# Patient Record
Sex: Male | Born: 1960 | Race: White | Hispanic: No | Smoking: Former smoker
Health system: Southern US, Community
[De-identification: ages and names within clinical notes are randomized; demographics above are authoritative.]

## PROBLEM LIST (undated history)

## (undated) DIAGNOSIS — J449 Chronic obstructive pulmonary disease, unspecified: Secondary | ICD-10-CM

## (undated) DIAGNOSIS — I10 Essential (primary) hypertension: Secondary | ICD-10-CM

## (undated) DIAGNOSIS — K219 Gastro-esophageal reflux disease without esophagitis: Secondary | ICD-10-CM

## (undated) DIAGNOSIS — M1990 Unspecified osteoarthritis, unspecified site: Secondary | ICD-10-CM

## (undated) DIAGNOSIS — J9601 Acute respiratory failure with hypoxia: Secondary | ICD-10-CM

## (undated) DIAGNOSIS — E559 Vitamin D deficiency, unspecified: Secondary | ICD-10-CM

## (undated) DIAGNOSIS — I639 Cerebral infarction, unspecified: Secondary | ICD-10-CM

## (undated) DIAGNOSIS — K922 Gastrointestinal hemorrhage, unspecified: Secondary | ICD-10-CM

## (undated) DIAGNOSIS — F02C0 Dementia in other diseases classified elsewhere, severe, without behavioral disturbance, psychotic disturbance, mood disturbance, and anxiety: Secondary | ICD-10-CM

## (undated) DIAGNOSIS — F17200 Nicotine dependence, unspecified, uncomplicated: Secondary | ICD-10-CM

## (undated) DIAGNOSIS — E785 Hyperlipidemia, unspecified: Secondary | ICD-10-CM

## (undated) DIAGNOSIS — F329 Major depressive disorder, single episode, unspecified: Secondary | ICD-10-CM

## (undated) DIAGNOSIS — G8191 Hemiplegia, unspecified affecting right dominant side: Secondary | ICD-10-CM

## (undated) HISTORY — DX: Essential (primary) hypertension: I10

## (undated) HISTORY — DX: Cerebral infarction, unspecified: I63.9

## (undated) HISTORY — PX: HERNIA REPAIR: SHX51

---

## 2002-09-11 ENCOUNTER — Inpatient Hospital Stay (HOSPITAL_COMMUNITY): Admission: AD | Admit: 2002-09-11 | Discharge: 2002-09-16 | Payer: Self-pay | Admitting: Neurological Surgery

## 2002-09-11 ENCOUNTER — Encounter: Payer: Self-pay | Admitting: *Deleted

## 2002-09-11 ENCOUNTER — Encounter: Payer: Self-pay | Admitting: Neurological Surgery

## 2002-09-14 ENCOUNTER — Encounter: Payer: Self-pay | Admitting: Neurological Surgery

## 2002-09-18 ENCOUNTER — Encounter: Admission: RE | Admit: 2002-09-18 | Discharge: 2002-09-18 | Payer: Self-pay | Admitting: Internal Medicine

## 2002-09-24 ENCOUNTER — Encounter: Admission: RE | Admit: 2002-09-24 | Discharge: 2002-09-24 | Payer: Self-pay | Admitting: Internal Medicine

## 2002-09-28 ENCOUNTER — Encounter: Admission: RE | Admit: 2002-09-28 | Discharge: 2002-09-28 | Payer: Self-pay | Admitting: Internal Medicine

## 2002-10-05 ENCOUNTER — Encounter: Admission: RE | Admit: 2002-10-05 | Discharge: 2002-10-05 | Payer: Self-pay | Admitting: Internal Medicine

## 2002-10-09 ENCOUNTER — Encounter: Admission: RE | Admit: 2002-10-09 | Discharge: 2002-10-09 | Payer: Self-pay | Admitting: Internal Medicine

## 2002-10-15 ENCOUNTER — Encounter: Admission: RE | Admit: 2002-10-15 | Discharge: 2002-10-15 | Payer: Self-pay | Admitting: Internal Medicine

## 2002-10-23 ENCOUNTER — Encounter: Admission: RE | Admit: 2002-10-23 | Discharge: 2002-10-23 | Payer: Self-pay | Admitting: Internal Medicine

## 2002-10-26 ENCOUNTER — Encounter: Admission: RE | Admit: 2002-10-26 | Discharge: 2002-10-26 | Payer: Self-pay | Admitting: Internal Medicine

## 2008-10-21 ENCOUNTER — Inpatient Hospital Stay (HOSPITAL_COMMUNITY): Admission: EM | Admit: 2008-10-21 | Discharge: 2008-10-25 | Payer: Self-pay | Admitting: Emergency Medicine

## 2008-10-23 ENCOUNTER — Encounter (INDEPENDENT_AMBULATORY_CARE_PROVIDER_SITE_OTHER): Payer: Self-pay | Admitting: *Deleted

## 2008-10-25 ENCOUNTER — Encounter (INDEPENDENT_AMBULATORY_CARE_PROVIDER_SITE_OTHER): Payer: Self-pay | Admitting: *Deleted

## 2008-10-25 ENCOUNTER — Ambulatory Visit: Payer: Self-pay | Admitting: Physical Medicine & Rehabilitation

## 2010-02-10 ENCOUNTER — Emergency Department (HOSPITAL_COMMUNITY): Admission: EM | Admit: 2010-02-10 | Discharge: 2010-02-10 | Payer: Self-pay | Admitting: Emergency Medicine

## 2010-10-18 LAB — BASIC METABOLIC PANEL
BUN: 8 mg/dL (ref 6–23)
Chloride: 101 mEq/L (ref 96–112)
Glucose, Bld: 81 mg/dL (ref 70–99)
Potassium: 3.4 mEq/L — ABNORMAL LOW (ref 3.5–5.1)

## 2010-10-18 LAB — CBC
HCT: 36.2 % — ABNORMAL LOW (ref 39.0–52.0)
Hemoglobin: 13.6 g/dL (ref 13.0–17.0)
MCHC: 34.5 g/dL (ref 30.0–36.0)
MCHC: 34.2 g/dL (ref 30.0–36.0)
MCV: 90.7 fL (ref 78.0–100.0)
Platelets: 121 10*3/uL — ABNORMAL LOW (ref 150–400)
RDW: 14.4 % (ref 11.5–15.5)
RDW: 14.5 % (ref 11.5–15.5)

## 2010-10-18 LAB — DIFFERENTIAL
Basophils Absolute: 0 10*3/uL (ref 0.0–0.1)
Basophils Absolute: 0.1 10*3/uL (ref 0.0–0.1)
Basophils Relative: 1 % (ref 0–1)
Eosinophils Absolute: 0.1 10*3/uL (ref 0.0–0.7)
Eosinophils Relative: 3 % (ref 0–5)
Eosinophils Relative: 3 % (ref 0–5)
Monocytes Absolute: 0.3 10*3/uL (ref 0.1–1.0)
Neutro Abs: 2.8 10*3/uL (ref 1.7–7.7)

## 2010-10-18 LAB — OSMOLALITY: Osmolality: 281 mOsm/kg (ref 275–300)

## 2010-10-18 LAB — URINALYSIS, ROUTINE W REFLEX MICROSCOPIC
Bilirubin Urine: NEGATIVE
Glucose, UA: NEGATIVE mg/dL
Hgb urine dipstick: NEGATIVE
Ketones, ur: NEGATIVE mg/dL
Protein, ur: NEGATIVE mg/dL

## 2010-10-18 LAB — HOMOCYSTEINE: Homocysteine: 11.3 umol/L (ref 4.0–15.4)

## 2010-10-18 LAB — OSMOLALITY, URINE: Osmolality, Ur: 755 mOsm/kg (ref 390–1090)

## 2010-10-18 LAB — APTT: aPTT: 30 seconds (ref 24–37)

## 2010-10-18 LAB — POCT I-STAT, CHEM 8
BUN: 7 mg/dL (ref 6–23)
Calcium, Ion: 0.96 mmol/L — ABNORMAL LOW (ref 1.12–1.32)
Creatinine, Ser: <0.2 mg/dL — ABNORMAL LOW (ref 0.4–1.5)
Glucose, Bld: 117 mg/dL — ABNORMAL HIGH (ref 70–99)
TCO2: 16 mmol/L (ref 0–100)

## 2010-10-18 LAB — LIPID PANEL: HDL: 32 mg/dL — ABNORMAL LOW (ref >39)

## 2010-10-18 LAB — RAPID URINE DRUG SCREEN, HOSP PERFORMED
Amphetamines: NOT DETECTED
Benzodiazepines: NOT DETECTED

## 2010-10-18 LAB — HEMOGLOBIN A1C: Mean Plasma Glucose: 117 mg/dL

## 2010-10-18 LAB — POTASSIUM: Potassium: 3.8 mEq/L (ref 3.5–5.1)

## 2010-11-21 NOTE — Discharge Summary (Signed)
NAMEMarland Sullivan  DERMOT, GREMILLION             ACCOUNT NO.:  [id number]   MEDICAL RECORD NO.:  [id number]          PATIENT TYPE:  INP   LOCATION:  3708                         FACILITY:  [facility]   PHYSICIAN:  Monte Fantasia, MD  DATE OF BIRTH:  [birth year]   DATE OF ADMISSION:  10/21/2008  DATE OF DISCHARGE:  10/25/2008                               DISCHARGE SUMMARY   ADDENDUM   The patient was evaluated by rehab physician and recommended home  physical therapy and outpatient physical therapies and that the patient  could be discharged home and does not need inpatient rehab.  Physical  Therapy reevaluation recommended the same as the patient reports to have  24-hour support.  At present, I discussed with the patient in detail and  with the patient's family [name]  discussed with her on the 24-  [id number], and the patient's family [name], she did agree that the  patient could have the home physical therapy and occupational therapy.  The patient will arrange for the home physical therapy and occupational  therapy for the same.  The patient at present is medically stable to be  discharged and can be discharged home.   Investigations done during the stay in the hospital; a 2-D echo done on  October 25, 2008.   IMPRESSION:  Left ventricular systolic function normal.  Ejection  fraction 55%-60%.  Doppler parameters consistent with abnormal left  ventricular relaxation.  Aortic valve:  Mildly calcified leaflets.  No  aortic stenosis.  No regurgitation.  Aorta:  Mild aortic root  dilatation.  Mitral valve:  No evidence of stenosis or regurgitation.  Left atrium:  Normal size.  Pulmonic valves were poorly visualized.  Tricuspid valve:  Trivial regurgitation.  Pulmonary artery:  Normal  size.  Right atrium:  Normal size.  Pericardium:  No pericardial  effusion.  Inferior vena cava:  Normal in size.   Carotid Doppler is performed.  Preliminary findings:  No assured left  40%-60% ICA stenosis.   Transcranial Doppler is performed. Report is no  obvious right ICA stenosis. Left ICA 40-60% Antegrade vetebral artery  flow bilaterally.   DISPOSITION:  The patient at present is medically stable for discharge.  The patient will arrange for home physical therapy and outpatient  therapy.  The patient reports to have 24-hour support at home.  The  patient's family has been counseled regarding the same and is willing to  provide for 24-hour support.  Also the patient needs to follow up with  [facility] as the patient does not have insurance in next 2-4 weeks.      Monte Fantasia, MD  Electronically Signed     MP/MEDQ  D:  10/25/2008  T:  10/26/2008  Job:  [id number]

## 2010-11-21 NOTE — Discharge Summary (Signed)
NAMEMarland Kitchen  Warren Sullivan, Warren Sullivan             ACCOUNT NO.:  [id number]   MEDICAL RECORD NO.:  [id number]          PATIENT TYPE:  INP   LOCATION:  3708                         FACILITY:  [facility]   PHYSICIAN:  Monte Fantasia, MD  DATE OF BIRTH:  [birth year]   DATE OF ADMISSION:  10/21/2008  DATE OF DISCHARGE:  10/25/2008                               DISCHARGE SUMMARY   PRIMARY CARE PHYSICIAN:  [facility].   DISCHARGE DIAGNOSES:  1. Acute infarction in right corona radiata.  2. Hypertension.  3. Tobacco abuse.  4. Anion gap metabolic acidosis which is resolved.  5. Thrombocytopenia which is stable.   DISCHARGE MEDICATIONS:  1. Aspirin 325 mg p.o. daily.  2. Hydrochlorothiazide 20.5 mg p.o. daily.  3. Albuterol 2 puffs MDI q.6 h. p.r.n. shortness of breath.  4. Potassium chloride 40 mEq p.o. daily.  5. Senna 1 tablet p.o. nightly.  6. Zocor 20 mg p.o. nightly.   COURSE DURING THE HOSPITAL STAY:  Mr. Warren Sullivan is a 50 year old  African American Caucasian male.  The patient was admitted on October 21, 2008 with complaints of status post fall in the bathroom and in the  kitchen x2 and left-sided weakness.  The patient was admitted to [facility] with a stroke protocol.  However, was out of the window for the  TPA.  The patient had initial CT scan revealed no intracranial bleed and  hence was started on aspirin and statins.  Also, the patient's blood  pressure was in 180s and labetalol was given during in the ER to control  the blood pressure and later on started on lisinopril 5 mg.  During the  stay in the hospital, the patient's cardiac enzymes were cycled which  was x3 negative.  Also, the patient was on the telemetry, was found to  be having episodes of bradycardia and hence Ace inhibitor was held.  The  patient's potassium was repleted and continued on statins for the same.  As per the neurology evaluation, the patient had a 2-D echo and  transcranial Dopplers  done today.  Also, the patient reports of which  are still awaited.  The patient also had evaluation with Physical  Therapy and what we used to recommended inpatient rehab.  The patient is  awaiting rehab evaluation at present and would follow up as per their  recommendations.   LABS DONE DURING THE STAY IN THE HOSPITAL:  Total WBC 4.6, hemoglobin  12.5, hematocrit 36.2, platelets of 121.  Sodium 137, potassium 3.8,  chloride 101, bicarb 27.  Glucose 81, BUN 8, creatinine 0.7, hemoglobin  A1c 5.7.  Mean plasma glucose 117, total cholesterol 157, triglycerides  63, HDL 32, LDL 112, homocysteine 11.3.  Drug screen was negative.  UA  was negative.   RADIOLOGICAL INVESTIGATIONS DONE DURING THE STAY IN THE HOSPITAL:  1. MRI of the brain done on October 21, 2008.  Impression; two focal      areas of restricted effusion compatible with acute infarctions of      the right corona radiata, multiple remote infarcts involving the  basal ganglia, extensive white matter disease in periventricular      regions, moderate atrophy, and prominent soft tissue in the      nasopharynx may represent adenoid tissue.  Bilateral mastoid      effusion suggesting potential obstruction.  2. MRA.  Impression; significantly degraded by the patient's motion.      No significant proximal stenosis, aneurysmal branch vessel      occlusions, questionable long segment stenosis of the left PCA P2      segment probable small vessel disease, nonspecific to the patient's      motion.  3. 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  No significant carotid      bifurcation disease, moderate toxicity of the internal carotid      arteries, bilaterally potential sequelae of uncontrolled      hypertension.  4. A 2-D echo report and transcranial Dopplers done today are pending.   PHYSICAL EXAMINATION:  VITAL SIGNS:  Temperature of 98.0, pulse of 59,  respirations 16, blood  pressure of 149/94, oxygen saturation 95% room  air.  HEENT:  Neck is supple.  Pupils are equal and reacting to light.  No  pallor, no lymphadenopathy.  RESPIRATORY:  Air entry is bilaterally equal.  No rales, no rhonchi.  CARDIOVASCULAR:  S1, S2, regular rate and rhythm.  ABDOMEN:  Soft.  No guarding, no rigidity, no tenderness.  EXTREMITIES:  No edema of feet.  NEUROLOGIC:  The patient is alert, awake, and oriented x3.  Cranial  nerves II-XII are intact.  Left upper extremity weakness of 3/3 to 4/5  and left lower extremity weakness which is 4/5, minimal slurring of  speech, deep tendon reflex is deepest on the left side.  Sensation is  intact.  Finger-to-nose test was normal.   DISPOSITION:  The patient at present is awaiting inpatient rehab  consult.  We will await the same 2-D echo and transcranial Doppler  reports are also awaited.  We will follow up regarding the same.  If the  patient at present is medically stable, we will continue on Zocor and  hydrochlorothiazide as for now.  At present, the patient's lisinopril is  on hold as telemetry showed episodes of bradycardia to 30s to 40s;  however, the patient has been asymptomatic and at present after  lisinopril being held, the patient is into his 52s to 52s.  The patient  at present is awaiting inpatient rehab evaluation.  Total time for  dictation is 40 minutes.      Monte Fantasia, MD  Electronically Signed     MP/MEDQ  D:  10/25/2008  T:  10/26/2008  Job:  [id number]

## 2010-11-21 NOTE — H&P (Signed)
NAMEMarland Sullivan  Warren, Sullivan             ACCOUNT NO.:  [id number]   MEDICAL RECORD NO.:  [id number]          PATIENT TYPE:  INP   LOCATION:  3708                         FACILITY:  [facility]   PHYSICIAN:  Darryl D. Prime, MD    DATE OF BIRTH:  07-17-1960   DATE OF ADMISSION:  10/21/2008  DATE OF DISCHARGE:                              HISTORY & PHYSICAL   The patient time about 30 minutes.   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  This started about 7 a.m. this  morning.  He relates falling in the bathroom and in the Sullivan.  He did  not hit his head at this time, both times that he fell, he felt he  cannot get up from the floor.  Again, he started dropping things from  his left hand and was dragging his foot while walking.  His mother also  noted a slurred speech.  He denies any dysphagia.  The patient denied  cough or difficulty swallowing.  She also denies blurry vision and no  dizziness.   ALLERGIES:  No known drug allergies.   PRIMARY CARE PHYSICIAN:  [facility] [place].   PAST MEDICAL HISTORY:  1. Intraventricular hemorrhage in 2004.  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  3. Hypertension.  4. Alcohol abuse.  5. Tobacco abuse.  6. COPD.   MEDICATIONS:  The patient is currently taking no medications.  He cannot  afford them.   FAMILY HISTORY:  His mother has hypertension and his father died at age  of 62 of an acute MI.  He has 4 brothers.  His brothers are healthy and  his children are healthy.   SOCIAL HISTORY:  He continues to smoke at least 1 pack per day.  He quit  drinking alcohol over 2 years.  He denies any recreational drugs.  He  lives in [place] with his mother.   REVIEW OF SYSTEMS:  GENERAL:  He denies fatigue or weakness.  HEENT:  Denies  yellow eyes or sore throat.  CARDIOVASCULAR:  Denies chest pain,  palpitations, PND, or orthopnea.  LUNGS:  Denies cough or dyspnea on  exertion.  ABDOMEN:  Denies constipation or diarrhea.  EXTREMITIES:  No  ulcer in his feet.  No edema.  NEURO:  As per HPI.   PHYSICAL EXAMINATION:  VITAL SIGNS:  Temperature 97.9, heart rate of 84,  blood pressure of 194/128, respiration rate of 20, and he is sating 97%  on room air.  GENERAL APPEARANCE:  He appears in no acute distress.  HEENT:  Moist mucous membranes.  No jaundice, pallor, or bruits.  No  JVD.  CARDIOVASCULAR:  He has regular rate and rhythm with a positive S1 and  S2.  No murmurs, rubs, or gallops.  LUNGS:  Good air movement and clear to auscultation.  ABDOMEN:  Positive bowel sounds, nontender, nondistended, and soft.  EXTREMITIES:  Positive pulses.  No edema.  NEUROLOGIC:  He is  alert and oriented x3.  Coherent and fluent language.  Cranial nerves III through XII are intact except for XI which showed a  4/5 weakness on the left side.  He also shows left-sided hemiparesis on  left arm.  He also shows left arm and left leg weakness 4/5.  He has 2+  deep tendon reflexes.  Sensation is intact throughout and he has a  slurred speech.  Finger-to-nose is normal.  SKIN:  Desquamative lesion on the face, no bleeding and depressed.  They  are dry.  These are on the left side of the face, below the eye, and  multiple on his right side of the face close to his hairline.   LABORATORY DATA:  His UDS is negative.  His UA is negative.  Alcohol  level is less than 5.  EKG shows a sinus rhythm.  Sodium is 135,  potassium 3.8, chloride of 103, bicarb of 16, BUN of 7, creatinine of  less than 0.2, and glucose of 117.  He has an anion gap of 16, PT 14.4,  INR 1.1.  White count 4.8 with an ANC of 2.8, hemoglobin of 13.6 with  MCV of 91 and an RDW of 14.5, and platelets are 129.   MRI pending.   ASSESSMENT AND PLAN:  1. Acute right periventricular  stroke.  The patient is greater than 8      hours out since symptoms started, so he does not qualify for TPA.      Neuro was consulted by the ER.  MRI results are pending and      recommendations by Neuro are pending.  The patient will benefit      from aspirin and blood pressure control.  We will get PT/OT      involved.  We will also get social worker for disability      evaluation.  We will start the patient on Pravachol.  We will check      a fasting lipid panel.  We will also check a hemoglobin A1c and a      fasting blood glucose.  Because of her history of syndrome of      inappropriate antidiuretic hormone, we will also check a serum      osmolality and urine osmolality.  2. Hypertension.  The patient has low income and was not taking his      medication, because he could not afford it.  We will not be too      aggressive with his blood pressure.  Right now, his systolic blood      pressure is in the 180s.  Labetalol, was given in the ED with no      decrease in his blood pressure, so we will start lisinopril 5 mg      and we will also start hydrochlorothiazide, which takes about 3      weeks to start working.  We will try to lower his blood pressure as      slowly as possible over the next week.  3. Tobacco abuse.  I have talked to the patient about quitting      smoking, he refused.  So, we will start him on a nicotine patch.  4. Anion gap metabolic acidosis.  By history and physical, there is no      red flag that would alert [place] of a suicide attempt or consumption of      alcohols or any other substances.  So, we will repeat a  BMET.  The      patient was not hypotensive and his alcohol level was less than 5.  5. Thrombocytopenia.  We will check a peripheral smear.  The patient      is on no medication, which would cause thrombocytopenia.  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  So,      we would      check an ANA.  The patient has not been  exposed to heparin in the      past.  We will also check a peripheral smear to evaluate for any      blood disorder.  6. Prophylaxis.  We will give Protonix for prophylaxis and heparin      subcutaneous.      Marinda Elk, M.D.  Electronically Signed      Darryl D. Prime, MD  Electronically Signed    AF/MEDQ  D:  10/21/2008  T:  10/22/2008  Job:  [id number]   cc:   [facility]

## 2010-11-21 NOTE — Consult Note (Signed)
NAMEMarland Sullivan  BALIN, VANDEGRIFT             ACCOUNT NO.:  [id number]   MEDICAL RECORD NO.:  [id number]          PATIENT TYPE:  INP   LOCATION:  3708                         FACILITY:  [facility]   PHYSICIAN:  Deanna Artis. Hickling, M.D.DATE OF BIRTH:  Feb 24, 1961   DATE OF CONSULTATION:  10/21/2008  DATE OF DISCHARGE:                                 CONSULTATION   CHIEF COMPLAINT:  Left leg weakness and clumsiness.   HISTORY OF PRESENT CONDITION:  The patient was awake from 3 o'clock this  morning and around 7 o'clock when going to the bathroom, he fell.  He  had weakness of his left leg and clumsiness.  He did not present to the  [facility] Emergency Room until 1449 hours, some nearly 8 hours later.  He was noted to have left leg weakness, also numbness.  I was asked to  assist Dr. Ignacia Palma in workup and recommended an MRI scan of the brain  without contrast and MRA intracranial.  The patient had Dopplers and MRA  extracranial.  He showed 2 acute infarctions in the body of the right  caudate.  He has multiple bilateral infarctions in the putamen internal  capsule thalamus bilaterally.  These are subcentimeter lesions.  There  do not appear to be any cortical lesions.  He also has ischemia within  his brain stem.   His MRA does demonstrate intracranial atherosclerotic disease, more  marked in the left middle cerebral artery than the right.  He has fairly  normal vessels in the major trunks of the circle of Willis.  Also in the  internal carotid vertebral arteries except vertebrals at the origin we  do not see a connection between those and the subclavian arteries.  This  may be artifactual.   On the basis of this acute stroke, the patient was admitted to the  hospital.  I was asked to consult to determine the etiology of his  dysfunction and to make recommendations for further workup and  treatment.  His main risk factors for stroke include hypertension and  tobacco.   He does not take any  medications.   He has no known allergies to medicines.   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  There is only a small speck of blood that I can see.  I see no  clips.  I am not certain what this was, but suspected it probably was  hemorrhagic stroke.  The patient stopped working at that time even  though he does not appear to be disabled.  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   Family history is positive for hypertension in his mother and his father  died of complications of COPD, lung cancer, and myocardial infarction.   SOCIAL HISTORY:  He is unemployed since 1994.  He smokes 1-1/2 packs per  day of cigarettes and has done so at least at this level since  childhood.  He does not drink alcohol for the past 2-1/2 years.  He does  not use drugs.  His drug screen is negative.  REVIEW OF SYSTEMS:  NEURO:  Multiple strokes.  Gait disorder.  CARDIOVASCULAR:  No myocardial infarction.  He does have significant  hypertension.  PULMONARY:  Chronic cough.  GI:  No nausea or vomiting.  GU:  No dysuria.  MUSCULOSKELETAL:  He has pain in his left ankle,  recurs in his ankle today.  He has broken the ankle before.  ENDOCRINE:  No diabetes.  REPRODUCTIVE:  Unremarkable.  IMMUNOLOGIC/LYMPHATIC:  No  significant anemia.  ALLERGY:  Negative.  NEUROPSYCHIATRIC:  Negative.   PHYSICAL EXAMINATION:  VITAL SIGNS:  On examination today, temperature  97.9, blood pressure 176/106, heart rate 64, respirations 16, oxygen  saturation 96% on 2 L.  HEAD, EYES, EARS, NOSE, AND THROAT:  No infections.  NECK:  Supple.  No bruits.  His face quite ruddy.  LUNGS:  Clear.  CARDIOVASCULAR:  No murmurs.  Pulses normal.  ABDOMEN:  Soft.  Bowel sounds normal.  SKIN:  Unremarkable.  NEUROLOGIC:  The patient is awake, alert.  He is dysarthric.  He has no  facial strength midline tongue normal strength.  Fine motor movements  were okay.  No drift.  In the NIH stroke scale, he  did have slight 2 in  his arm and leg.  According to protocol, he has a mild left sensory  hypesthesia in the arm and has left leg dystaxia.  Otherwise, his  examination is negative.  I did not have him walk.  His reflexes are  normal to brisk, previous bilateral flexor plantar responses.   LABORATORY STUDIES:  Sodium 135, potassium 3.8, chloride 103, CO2 of 16,  BUN 7, creatinine 0.2, glucose 117.  White blood cell count 4800,  hemoglobin 13.6, hematocrit 39.9, platelet count 129,000.  PT 14.4, INR  1.0, PTT 30.  I have discussed the MRI scan.   IMPRESSION:  1. Acute right body of the caudate subcentimeter lacunar lesions      widely related to intracranial small vessel atherosclerotic disease      related to hypertension and smoking. (434.01, 404.1)  2. Remote bilateral subcortical infarctions multiple bilaterally.  3. Hypertension in poor control.  4. Tobacco abuse.   PLAN:  The patient is evaluated for his risk factors for stroke and  needed to be treated with medications that he could afford.  I have told  him that he needs to quit smoking as did the admitting physician.  I do  not think that he will be motivated to do so.   He needs a noninvasive vascular workup, 2-D echocardiogram, carotid  Doppler, transcranial Doppler, as well as laboratory studies.  I will  check out him in the morning.  He will be followed by our stroke  service.      Deanna Artis. Sharene Skeans, M.D.  Electronically Signed     [facility]/MEDQ  D:  10/21/2008  T:  10/22/2008  Job:  [id number]

## 2010-11-24 NOTE — Discharge Summary (Signed)
NAME:  Warren Sullivan, Warren Sullivan                       ACCOUNT NO.:  [id number]   MEDICAL RECORD NO.:  [id number]                   PATIENT TYPE:  INP   LOCATION:  3024                                 FACILITY:  [facility]   PHYSICIAN:  C. Ulyess Mort, M.D.             DATE OF BIRTH:  11-Dec-1960   DATE OF ADMISSION:  09/11/2002  DATE OF DISCHARGE:  09/16/2002                                 DISCHARGE SUMMARY   DISCHARGE DIAGNOSES:  1. Intraventricular hemorrhage secondary to cerebrovascular accident.  2. 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  3. Hypertension.  4. Alcohol abuse.  5. Probable chronic obstructive pulmonary disease.  6. Tobacco abuse.   DISCHARGE MEDICATIONS:  1. Enalapril 10 mg 1 p.o. b.i.d.  2. Metoprolol 150 mg p.o. b.i.d.  3. Norvasc 5 mg p.o. daily.   DISCHARGE INSTRUCTIONS:  The patient was instructed to limit fluid to  include water, coffee, tea, beer, and juice, etc., as soon as possible to  about a quart or 1 liter a day.   FOLLOW UP:  The patient[ is to followup at the outpatient clinic at [facility] on Friday, September 18, 2002 at 10 o'clock a.m. for lab work and  Thursday 09/24/2002 at 3 p.m. for visit and lab work.  As per patient request  for long-term care, he was requested to make an appointment as soon as  possible at the [facility].  (He had been seen in the past  at the clinic but did not have records when I called.)   HISTORY OF PRESENT ILLNESS:  The patient is a 50 year old white male who was  admitted to the neurosurgery service with a intraventricular hemorrhage.  He  initially was found by his mother on the morning of admission to be poorly  responsive.  He as initially evaluated at [facility] where CT of  brain demonstrated a right-sided intraventricular hemorrhage with origin  apparently being right thalamus.   PAST MEDICAL HISTORY:   Significant for no previous medical care.  He does  not have a regular physician.   MEDICATIONS:  He denies taking any prescription medicine but does take Goody  powders and alcohol (up to more than a case at a time) regularly.   PHYSICAL EXAMINATION:  VITAL SIGNS:  Blood pressure 170/86, heart rate 76,  respirations 16.  NEUROLOGIC:  Per admission note, the patient was alert to voice and followed  commands but was oriented only x 1 (hospital but not date).  Otherwise exam  was nonfocal at that time.  CARDIAC:  Normal.  LUNGS:  Decreased breath sounds.  ABDOMEN:  Liver edge palpable otherwise benign.   HOSPITAL COURSE:  The patient was admitted to the neurosurgery service.  He  was initially observed and received blood pressure management.  The  following issues were addressed during the hospital course.  1. INTRAVENTRICULAR HEMORRHAGE:  Initial CT.  An MRI was then obtained to     rule out possibility of tumor or vascular malformation.  The MRI     revealed intraventricular hemorrhage.  No large amount of clot was     located within the right occipital horn and atrial trigone adjacent to     choroid plexus.  The radiologist felt possible site of hemorrhage either     from the choroid plexus or the ependymal lining of the ventricle.  There     was a small amount of blood in the left atrial trigone and occipital horn     and fourth ventricle which likely represented spread from location from     the right side via CSF pathways.  There was no evidence of significant     subarachnoid hemorrhage or definite parenchymal hemorrhage.  The patient     also had a mild obstructive hydrocephalus.  There was advanced atrophy     and small vessel disease given the patient's age and a small acute     lacunar infarction in the right parietal deep white matter. Study done on     March 5.  A repeat of the CT of the head performed on March 8 revealed     that the intraventricular hemorrhage involving  predominately the     occipital horn of the right ventricle appeared to hae decreased slightly     since the MRI.  There were no new abnormalities.  The patient's     intraventricular hemorrhage was felt to be stable.  He was transferred     out of the ICU and into the care of the general medical internal medicine     service team.  At the time of discharge, he had no residual neurological     deficits, had not complaint of headache or visual changes.  He had no     difficulty with strength or motor coordination.  Initial workup for     coagulopathy was negative with patient's PTT 29 and INR 0.9.   1. SYNDROME OF INAPPROPRIATE SECRETION OF ANTIDIURETIC HORMONE SECONDARY TO     #1:  At admission, the patient's sodium was 122, chloride 91, potassium     4.0,  During admission, sodium increased to a maximum of 129, but then on     transfer to the regular floor, the patient's sodium had declined back to     123 with a chloride of 91.  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  Urine osmolality of 454 at the time the sodium was 120     supports this.  TSH was within normal limits to rule out hypothyroidism     as cause of hyponatremia.  Measured serum osmolality was low at 251.     Fluid restriction was initiated for patient on March 9, and sodium was     elevated to 125 prior to discharge.  At discharge BMP showed sodium 125,     potassium 4.1, chloride 93, bicarb 25, BUN 8, creatinine 0.6, glucose 97.     The cause and possible consequences of hyponatremia were discussed with     the patient.  Importance of maintaining a low-fluid intake of     approximately no more than a liter as possible was reiterated.  Risks of  noncompliance with fluid restriction that was explained to the patient     include risk of seizure.  The patient appeared to understand and agreed    to comply with the fluid  restriction.  He is to follow up in two days     after discharge for repeat electrolyte test.  It is anticipated that     SIADH should resolve as the patient recovers from the intraventricular     hemorrhage.   1. HYPERTENSION:  At discharge, the patient had fair blood pressure control     with blood pressure of 156/78.  Antihypertensive medicines are listed as     previously described.  The patient was not given a thiazide due to the     fact that there is risk of worsening hyponatremia.   1. ALCOHOL ABUSE:  The patient freely admits to consumption of regularly a     six-pack, occasionally a case and on occasion more than a case of beer a     day.  Throughout the hospital course with the exception of when he was     placed on fluid restriction, the patient received a beer with meals and     at bedtime for prophylaxis to prevent withdrawal.  He did not have any     symptoms of withdrawal tremens throughout the hospital course.  Risks of     increased disease if he were to continue increased alcohol consumption     were discussed with the patient.   1. PROBABLE CHRONIC OBSTRUCTIVE PULMONARY DISEASE:  On exam, the patient had     decreased breath sounds with expiratory greater than inspiratory phase of     respiration.  He received a nicotine patch while in the hospital but     appeared to be in great contemplative state in regards to smoking     cessation.  He received only one albuterol treatment during hospital     course.  It was recommended, as an outpatient, that he receive formal     pulmonary function tests to determine his pulmonary status and may     warrant treatment for COPD as an outpatient.    DISPOSITION:  The patient was discharged to home with previously described  followup and prescriptions.  Again, he is to come for acute follow up to the  outpatient clinic, but he requested long-term followup with physicians at  the [facility] as he lives just a few  minutes from that  clinic, and all of his family members go to that clinic.     Douglass Rivers, M.D.                      Gary Fleet, M.D.    CH/MEDQ  D:  09/16/2002  T:  09/17/2002  Job:  [id number]   cc:   [facility]
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
## 2013-01-21 ENCOUNTER — Ambulatory Visit: Payer: Self-pay | Admitting: Family Medicine

## 2013-02-09 ENCOUNTER — Ambulatory Visit: Payer: Self-pay | Admitting: Family Medicine

## 2013-02-19 ENCOUNTER — Encounter (HOSPITAL_COMMUNITY): Payer: Self-pay | Admitting: Emergency Medicine

## 2013-02-19 ENCOUNTER — Emergency Department (HOSPITAL_COMMUNITY): Payer: Medicare Other

## 2013-02-19 ENCOUNTER — Ambulatory Visit (INDEPENDENT_AMBULATORY_CARE_PROVIDER_SITE_OTHER): Payer: Medicare Other | Admitting: Family Medicine

## 2013-02-19 ENCOUNTER — Encounter: Payer: Self-pay | Admitting: Family Medicine

## 2013-02-19 ENCOUNTER — Emergency Department (HOSPITAL_COMMUNITY)
Admission: EM | Admit: 2013-02-19 | Discharge: 2013-02-19 | Disposition: A | Discharge status: Home / Self Care | Payer: Medicare Other | Attending: Emergency Medicine | Admitting: Emergency Medicine

## 2013-02-19 VITALS — BP 201/105 | HR 67 | Temp 97.0°F | Ht 72.0 in | Wt 195.0 lb

## 2013-02-19 DIAGNOSIS — Z91199 Patient's noncompliance with other medical treatment and regimen due to unspecified reason: Secondary | ICD-10-CM | POA: Insufficient documentation

## 2013-02-19 DIAGNOSIS — Z8673 Personal history of transient ischemic attack (TIA), and cerebral infarction without residual deficits: Secondary | ICD-10-CM | POA: Insufficient documentation

## 2013-02-19 DIAGNOSIS — Z9119 Patient's noncompliance with other medical treatment and regimen: Secondary | ICD-10-CM | POA: Insufficient documentation

## 2013-02-19 DIAGNOSIS — K5900 Constipation, unspecified: Secondary | ICD-10-CM

## 2013-02-19 DIAGNOSIS — I10 Essential (primary) hypertension: Secondary | ICD-10-CM | POA: Insufficient documentation

## 2013-02-19 DIAGNOSIS — R4789 Other speech disturbances: Secondary | ICD-10-CM | POA: Insufficient documentation

## 2013-02-19 DIAGNOSIS — F17200 Nicotine dependence, unspecified, uncomplicated: Secondary | ICD-10-CM | POA: Insufficient documentation

## 2013-02-19 DIAGNOSIS — R4701 Aphasia: Secondary | ICD-10-CM | POA: Insufficient documentation

## 2013-02-19 LAB — CBC WITH DIFFERENTIAL/PLATELET
Basophils Absolute: 0 10*3/uL (ref 0.0–0.1)
Eosinophils Absolute: 0.1 10*3/uL (ref 0.0–0.7)
Lymphocytes Relative: 39 % (ref 12–46)
Lymphs Abs: 2.3 10*3/uL (ref 0.7–4.0)
Neutrophils Relative %: 52 % (ref 43–77)
Platelets: 129 10*3/uL — ABNORMAL LOW (ref 150–400)
RBC: 4.35 MIL/uL (ref 4.22–5.81)
RDW: 12.9 % (ref 11.5–15.5)
WBC: 5.9 10*3/uL (ref 4.0–10.5)

## 2013-02-19 LAB — BASIC METABOLIC PANEL
CO2: 28 mEq/L (ref 19–32)
GFR calc non Af Amer: >90 mL/min (ref >90)
Glucose, Bld: 84 mg/dL (ref 70–99)
Potassium: 3.7 mEq/L (ref 3.5–5.1)
Sodium: 138 mEq/L (ref 135–145)

## 2013-02-19 LAB — URINALYSIS, ROUTINE W REFLEX MICROSCOPIC
Hgb urine dipstick: NEGATIVE
Leukocytes, UA: NEGATIVE
Protein, ur: NEGATIVE mg/dL
Specific Gravity, Urine: 1.005 (ref 1.005–1.030)
Urobilinogen, UA: 0.2 mg/dL (ref 0.0–1.0)

## 2013-02-19 MED ORDER — HYDROCHLOROTHIAZIDE 25 MG PO TABS: 25.0000 mg | ORAL_TABLET | Freq: Every day | ORAL | Status: DC

## 2013-02-19 MED ORDER — POLYETHYLENE GLYCOL 3350 17 GM/SCOOP PO POWD: 17.0000 g | Freq: Two times a day (BID) | ORAL | Status: DC | PRN

## 2013-02-19 NOTE — ED Provider Notes (Signed)
CSN: [id number]     Arrival date & time 02/19/13  1756 History     First MD Initiated Contact with Patient 02/19/13 1802     Chief Complaint  Patient presents with  . Hypertension  . Aphasia   (Consider location/radiation/quality/duration/timing/severity/associated sxs/prior Treatment) Patient is a 52 y.o. male presenting with hypertension. The history is provided by the patient and medical records.  Hypertension This is a chronic problem. The current episode started more than 1 year ago. The problem occurs constantly. The problem has been unchanged. Pertinent negatives include no abdominal pain, chest pain, congestion, coughing, fatigue, fever, headaches, nausea, neck pain, numbness, rash, sore throat, vomiting or weakness. Nothing aggravates the symptoms. Treatments tried: has been non-compliant with his anti-hypertensives. The treatment provided no relief.    Past Medical History  Diagnosis Date  . Hypertension   . Stroke    Past Surgical History  Procedure Laterality Date  . Hernia repair     History reviewed. No pertinent family history. History  Substance Use Topics  . Smoking status: Current Every Day Smoker  . Smokeless tobacco: Not on file  . Alcohol Use: No    Review of Systems  Constitutional: Negative for fever, activity change, appetite change and fatigue.  HENT: Negative for congestion, sore throat, facial swelling, rhinorrhea, trouble swallowing, neck pain, neck stiffness, voice change and sinus pressure.   Eyes: Negative.   Respiratory: Negative for cough, choking, chest tightness, shortness of breath and wheezing.   Cardiovascular: Negative for chest pain.  Gastrointestinal: Negative for nausea, vomiting and abdominal pain.  Genitourinary: Negative for dysuria, urgency, frequency, hematuria, flank pain and difficulty urinating.  Musculoskeletal: Negative for back pain and gait problem.  Skin: Negative for rash and wound.  Neurological: Negative for facial  asymmetry, weakness, numbness and headaches.  Psychiatric/Behavioral: Negative for behavioral problems, confusion and agitation. The patient is not nervous/anxious and is not hyperactive.   All other systems reviewed and are negative.    Allergies  Review of patient's allergies indicates no known allergies.  Home Medications   Current Outpatient Rx  Name  Route  Sig  Dispense  Refill  . Magnesium Hydroxide (MILK OF MAGNESIA PO)   Oral   Take 1 each by mouth daily as needed (constipation).         . Naproxen Sodium (ALEVE PO)   Oral   Take 2 tablets by mouth daily as needed (headache, pain).         . Sennosides (EX-LAX PO)   Oral   Take 2 tablets by mouth daily as needed (constipation).         . polyethylene glycol powder (GLYCOLAX/MIRALAX) powder   Oral   Take 17 g by mouth 2 (two) times daily as needed.   3350 g   1    BP 153/89  Pulse 57  Temp(Src) 97.4 F (36.3 C) (Oral)  Resp 22  SpO2 95% Physical Exam  Nursing note and vitals reviewed. Constitutional: He is oriented to person, place, and time. He appears well-developed and well-nourished. No distress.  HENT:  Head: Normocephalic and atraumatic.  Right Ear: External ear normal.  Left Ear: External ear normal.  Mouth/Throat: No oropharyngeal exudate.  Eyes: Conjunctivae and EOM are normal. Pupils are equal, round, and reactive to light. Right eye exhibits no discharge. Left eye exhibits no discharge.  Neck: Normal range of motion. Neck supple. No JVD present. No tracheal deviation present. No thyromegaly present.  Cardiovascular: Normal rate, regular rhythm, normal heart  sounds and intact distal pulses.  Exam reveals no gallop and no friction rub.   No murmur heard. Pulmonary/Chest: Effort normal and breath sounds normal. No respiratory distress. He has no wheezes. He exhibits no tenderness.  Abdominal: Soft. Bowel sounds are normal. He exhibits no distension. There is no tenderness. There is no rebound  and no guarding.  Musculoskeletal: Normal range of motion. He exhibits no edema and no tenderness.  Lymphadenopathy:    He has no cervical adenopathy.  Neurological: He is alert and oriented to person, place, and time. He has normal strength. No cranial nerve deficit or sensory deficit. He exhibits normal muscle tone. He displays a negative Romberg sign. Coordination and gait normal. GCS eye subscore is 4. GCS verbal subscore is 5. GCS motor subscore is 6.  Normal finger to nose and heel to shin. Patient able to repeat words and has no apparent aphasia on my exam.  Skin: Skin is warm and dry. No rash noted. He is not diaphoretic. No pallor.  Psychiatric: He has a normal mood and affect. His behavior is normal.    ED Course   Procedures (including critical care time)  Labs Reviewed  CBC WITH DIFFERENTIAL - Abnormal; Notable for the following:    HCT 38.7 (*)    MCHC 36.2 (*)    Platelets 129 (*)    All other components within normal limits  BASIC METABOLIC PANEL  URINALYSIS, ROUTINE W REFLEX MICROSCOPIC   Dg Chest 2 View  02/19/2013   *RADIOLOGY REPORT*  Clinical Data: Aphasia, hypertension  CHEST - 2 VIEW  Comparison: None.  Findings: Lung volumes are low with crowding of the bronchovascular markings.  No focal/lobar consolidation.  No pleural effusion. Heart size is normal. Aorta is ectatic and unfolded.  No acute osseous finding.  IMPRESSION: Curvilinear bilateral lobe atelectasis.  No focal acute finding otherwise.   Original Report Authenticated By: Christiana Pellant, M.D.   Ct Head Wo Contrast  02/19/2013   *RADIOLOGY REPORT*  Clinical Data: Hypertension.  Altered speech.  History of prior infarcts.  CT HEAD WITHOUT CONTRAST  Technique:  Contiguous axial images were obtained from the base of the skull through the vertex without contrast.  Comparison: 10/21/2008 MRI.  Findings: Bone windows demonstrate hypoplastic frontal sinuses. Clear mastoid air cells.  Soft tissue windows demonstrate  age advanced cerebral atrophy. Cava septum pellucidum.  Moderate and age advanced low density in the periventricular white matter likely related to small vessel disease.Remote lacunar infarcts in the caudate heads bilaterally. No  mass lesion, hemorrhage, hydrocephalus, acute infarct, intra- axial, or extra-axial fluid collection.  IMPRESSION:  1.  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  Remote caudate head lacunar infarcts. 2. No acute intracranial abnormality.   Original Report Authenticated By: Jeronimo Greaves, M.D.   1. Hypertension     MDM  52 yr old M pt here as a transfer from his family physician because he reportedly had a BP of > 220. They gave the patient clonidine and sent him here. The practitioner was concerned because the daughter that accompanied the patient said that he had been having some progressive slurring of his speech over the past couple of weeks. Patient has been non-compliant with his blood pressure medicine. Patient now with multiple repeat normal blood pressures with no intervention here. Given the possible new speech slurring noted by daughter did CT to look for new stroke or hypertensive encephalopathy changes. Ct with no acute changes but evidence of vascular changes  that are chronic. No signs of end organ damage otherwise from HTN. Normal creatinine and no proteinuria. Patient needs better long term blood pressure control but no indication to admit at this time.   Date: 02/19/2013  Rate: 68  Rhythm: normal sinus rhythm  QRS Axis: normal  Intervals: normal  ST/T Wave abnormalities: flat t waves lateral but non specific changes in nature  Conduction Disutrbances:none  Narrative Interpretation: LVH  Old EKG Reviewed: unchanged    Case discussed with Dr. Catarina Hartshorn, MD 02/19/13 2015

## 2013-02-19 NOTE — ED Notes (Signed)
PT transported to radiology  

## 2013-02-19 NOTE — ED Notes (Signed)
Per EMS pt came from [facility] medicine with Hypertensive crisis. Initial BP was 201/105 pt was given 0.1mg  clonidine po at 1700. Pt reports he has been off all medications for over 1 month but is unable to report which medication he takes. Pt has hx of 3 strokes, unable to report baseline deficits. No arm drift. Family reports pt speech was "off" today.

## 2013-02-19 NOTE — Progress Notes (Signed)
  Subjective:    Patient ID: Warren Sullivan, male    DOB: 01-30-1961, 52 y.o.   MRN: [id number]  HPI This 52 y.o. male presents for evaluation of high blood pressure.  He has been out Of blood pressure medicine for 3 months.  He has hx of CVA and his daughter who Accompanies him states he has had recent CVA due to his hypertension.  She states  He has had this happen in the past and he will not stay on his blood pressure medicine. Daughter states he may have had another stroke and that he really needs to go to the hospital.    Review of Systems No chest pain, SOB, HA, dizziness, vision change, N/V, diarrhea, constipation, dysuria, urinary urgency or frequency, myalgias, arthralgias or rash.  Objective:   Physical Exam Vital signs noted  Chronically ill appearing male.  HEENT - Head atraumatic Normocephalic                Eyes - PERRLA, Conjuctiva - clear Sclera- Clear EOMI                Ears - EAC's Wnl TM's Wnl Gross Hearing WNL                Nose - Nares patent                 Throat - Poor dentition. Respiratory - Lungs CTA bilateral Cardiac - RRR S1 and S2 without murmur BP - 220/120 GI - Abdomen soft Nontender and bowel sounds active x 4 Extremities - No edema. Neuro - Right upper and lower extremity weakness and parasthesia. Patient is dysarthric but alert and OX3.       Assessment & Plan:  Essential hypertension, malignant - Plan: HTN crisis and discussed with daughter and patient he needs to go to ED And EMS takes him to [facility].  Unspecified constipation - Plan: polyethylene glycol powder (GLYCOLAX/MIRALAX) powder  Tobacco abuse - Discussed with patient that he needs to quit smoking.

## 2013-02-20 NOTE — Patient Instructions (Signed)

## 2013-02-20 NOTE — ED Provider Notes (Signed)
Medical screening examination/treatment/procedure(s) were conducted as a shared visit with resident-physician practitioner(s) and myself.  I personally evaluated the patient during the encounter.  Pt is a 52 y.o. male with pmhx as above presenting with report of elevated BP and report by daughter-in-law that he "hasn't been acting right" for past weeks to months with progressive slurred speech.  Pt normotensive here w/o intervention. Pt found to have no ischemic changes on CXR, nml Cr, no proteinuria, chronic small vessel ischemic changes on CT head with remote infarct.  My neuro exam unremarkable and I do not appreciate AMS ot slurred speech.  I believe he is safe for outpt PCP f/u and needs to restart home antihypertensives. I do not believe visit represents acute CVA/TIA.     Shanna Cisco, MD 02/20/13 1128

## 2013-03-18 ENCOUNTER — Ambulatory Visit (INDEPENDENT_AMBULATORY_CARE_PROVIDER_SITE_OTHER): Payer: Medicare Other | Admitting: Family Medicine

## 2013-03-18 ENCOUNTER — Encounter: Payer: Self-pay | Admitting: Family Medicine

## 2013-03-18 VITALS — BP 179/95 | HR 68 | Temp 97.2°F | Ht 72.0 in | Wt 188.8 lb

## 2013-03-18 DIAGNOSIS — I10 Essential (primary) hypertension: Secondary | ICD-10-CM

## 2013-03-18 DIAGNOSIS — K5900 Constipation, unspecified: Secondary | ICD-10-CM

## 2013-03-18 DIAGNOSIS — M129 Arthropathy, unspecified: Secondary | ICD-10-CM

## 2013-03-18 DIAGNOSIS — M1990 Unspecified osteoarthritis, unspecified site: Secondary | ICD-10-CM

## 2013-03-18 MED ORDER — CLONIDINE HCL 0.1 MG PO TABS: 0.1000 mg | ORAL_TABLET | Freq: Three times a day (TID) | ORAL | Status: DC

## 2013-03-18 MED ORDER — MELOXICAM 15 MG PO TABS: 15.0000 mg | ORAL_TABLET | Freq: Every day | ORAL | Status: DC

## 2013-03-18 MED ORDER — HYDROCHLOROTHIAZIDE 25 MG PO TABS: 25.0000 mg | ORAL_TABLET | Freq: Every day | ORAL | Status: DC

## 2013-03-18 MED ORDER — POLYETHYLENE GLYCOL 3350 17 GM/SCOOP PO POWD: 17.0000 g | Freq: Two times a day (BID) | ORAL | Status: DC | PRN

## 2013-03-18 NOTE — Progress Notes (Signed)
  Subjective:    Patient ID: Warren Sullivan, male    DOB: 1960-08-01, 52 y.o.   MRN: [id number]  HPI This 52 y.o. male presents for evaluation of hypertension. He has been taking HCTZ 25mg  po qd and his bp is still not controlled.  He states he was taking Clonidine 0.1mg  tid in [facility] and this worked to control his blood pressure. He has been having some arthritis.  He has been having some constipation.   Review of Systems C/o arthritis, constipation, and hypertension. No chest pain, SOB, HA, dizziness, vision change, N/V, diarrhea, constipation, dysuria, urinary urgency or frequency or rash.     Objective:   Physical Exam Vital signs noted  Well developed well nourished male.  HEENT - Head atraumatic Normocephalic                Eyes - PERRLA, Conjuctiva - clear Sclera- Clear EOMI                Ears - EAC's Wnl TM's Wnl Gross Hearing WNL                Nose - Nares patent                 Throat - oropharanx wnl Respiratory - Lungs CTA bilateral Cardiac - RRR S1 and S2 without murmur GI - Abdomen soft Nontender and bowel sounds active x 4 Extremities - No edema. Neuro - Grossly intact.       Assessment & Plan:  Essential hypertension, malignant - Plan: polyethylene glycol powder (GLYCOLAX/MIRALAX) powder, hydrochlorothiazide (HYDRODIURIL) 25 MG tablet  Unspecified constipation - Plan: polyethylene glycol powder (GLYCOLAX/MIRALAX) powder  Arthritis - Plan: meloxicam (MOBIC) 15 MG tablet  Follow up in one week.

## 2013-03-18 NOTE — Patient Instructions (Signed)

## 2013-04-01 ENCOUNTER — Ambulatory Visit: Payer: Medicare Other | Admitting: Family Medicine

## 2013-12-30 ENCOUNTER — Ambulatory Visit: Payer: Medicare Other | Admitting: Family Medicine

## 2014-01-01 ENCOUNTER — Ambulatory Visit (INDEPENDENT_AMBULATORY_CARE_PROVIDER_SITE_OTHER): Payer: Medicare Other | Admitting: Family Medicine

## 2014-01-01 ENCOUNTER — Encounter: Payer: Self-pay | Admitting: Family Medicine

## 2014-01-01 VITALS — BP 178/105 | HR 69 | Temp 97.0°F | Ht 72.0 in | Wt 182.0 lb

## 2014-01-01 DIAGNOSIS — M1990 Unspecified osteoarthritis, unspecified site: Secondary | ICD-10-CM

## 2014-01-01 DIAGNOSIS — I10 Essential (primary) hypertension: Secondary | ICD-10-CM

## 2014-01-01 DIAGNOSIS — K5900 Constipation, unspecified: Secondary | ICD-10-CM

## 2014-01-01 DIAGNOSIS — M129 Arthropathy, unspecified: Secondary | ICD-10-CM

## 2014-01-01 MED ORDER — POLYETHYLENE GLYCOL 3350 17 GM/SCOOP PO POWD: 17.0000 g | Freq: Two times a day (BID) | ORAL | Status: DC | PRN

## 2014-01-01 MED ORDER — HYDROCHLOROTHIAZIDE 25 MG PO TABS: 25.0000 mg | ORAL_TABLET | Freq: Every day | ORAL | Status: DC

## 2014-01-01 MED ORDER — CLONIDINE HCL 0.1 MG PO TABS: 0.1000 mg | ORAL_TABLET | Freq: Three times a day (TID) | ORAL | Status: DC

## 2014-01-01 MED ORDER — MELOXICAM 15 MG PO TABS: 15.0000 mg | ORAL_TABLET | Freq: Every day | ORAL | Status: DC

## 2014-01-01 NOTE — Progress Notes (Signed)
   Subjective:    Patient ID: Warren Sullivan, male    DOB: Mar 24, 1961, 53 y.o.   MRN: [id number]  HPI This 53 y.o. male presents for evaluation of placement into SNF.  He has hx of CVA and left hemiparesis.  He has hx of uncontrolled hypertension due to noncompliance.  He has hx Of IBS and chronic constipation.  He is not taking his medications.  His family cannot care  For him because they work.  He is not eating well and is not being taken care of well according To his daughter and son in law.   Review of Systems No chest pain, SOB, HA, dizziness, vision change, N/V, diarrhea, constipation, dysuria, urinary urgency or frequency, myalgias, arthralgias or rash.     Objective:   Physical Exam Vital signs noted  Chronically ill appearing male who looks older than stated age.  HEENT - Head atraumatic Normocephalic                Eyes - PERRLA, Conjuctiva - clear Sclera- Clear EOMI                Ears - EAC's Wnl TM's Wnl Gross Hearing WNL                Throat - oropharanx wnl Respiratory - Lungs CTA bilateral Cardiac - RRR S1 and S2 without murmur GI - Abdomen soft Nontender and bowel sounds active x 4 Extremities - No edema. Neuro - Grossly intact.       Assessment & Plan:  Essential hypertension, malignant - Plan: cloNIDine (CATAPRES) 0.1 MG tablet, hydrochlorothiazide (HYDRODIURIL) 25 MG tablet, polyethylene glycol powder (GLYCOLAX/MIRALAX) powder.  CVA - Instructed patient that it is important to take bp medicine.  Arthritis - Plan: meloxicam (MOBIC) 15 MG tablet  Unspecified constipation - Plan: polyethylene glycol powder (GLYCOLAX/MIRALAX) powder  FL2 from filled out for SNF.  Deatra CanterWilliam J Oxford FNP

## 2014-02-17 ENCOUNTER — Ambulatory Visit (INDEPENDENT_AMBULATORY_CARE_PROVIDER_SITE_OTHER): Payer: Medicare Other | Admitting: Family Medicine

## 2014-02-17 VITALS — BP 168/90 | HR 59 | Temp 97.5°F | Ht 72.0 in | Wt 190.0 lb

## 2014-02-17 DIAGNOSIS — G319 Degenerative disease of nervous system, unspecified: Secondary | ICD-10-CM

## 2014-02-17 DIAGNOSIS — I10 Essential (primary) hypertension: Secondary | ICD-10-CM

## 2014-02-17 DIAGNOSIS — R404 Transient alteration of awareness: Secondary | ICD-10-CM

## 2014-02-17 NOTE — Progress Notes (Signed)
   Subjective:    Patient ID: Warren Sullivan, male    DOB: Sep 20, 1960, 53 y.o.   MRN: [id number]  HPI This 53 y.o. male presents for evaluation of nursing home placement.  Patient has hx of uncontrolled hypertension due to noncompliance.  Patient lives by himself and family state he is unable to take care Of himself.  He has been noncompliant with his medicine and he is not eating and drinking like he should.  He doesn't bathe on regular basis.  He has hx of alcoholism but states he does not drink anymore he states but he still smokes cigarettes.  Recent CT of head shows age advanced cerbral atrophy and small vessel ischemia.  His niece is here with him and reports he cannot live in the conditions he lives in and does not want to be responsible for his care and wants to get him into a nursing home or into an assisted living.  Patient is upset and is yelling that he does not want to go to  A nursing home.   Review of Systems No chest pain, SOB, HA, dizziness, vision change, N/V, diarrhea, constipation, dysuria, urinary urgency or frequency, myalgias, arthralgias or rash.     Objective:   Physical Exam Vital signs noted  Chronically ill appearing male who looks older than stated age.  HEENT - Head atraumatic Normocephalic                Eyes - PERRLA, Conjuctiva - clear Sclera- Clear EOMI                Ears - EAC's Wnl TM's Wnl                 Throat - opx w/o injection poor dentition Respiratory - Lungs CTA bilateral Cardiac - RRR S1 and S2 without murmur GI - Abdomen soft Nontender and bowel sounds active x 4 Extremities - No edema.        Assessment & Plan:  Unspecified essential hypertension  Cerebral atrophy - Plan: Ambulatory referral to Neurology  Transient alteration of awareness - Plan: Ambulatory referral to Neurology  Patient is unable to take medicine and care for himself at home so would be in best interest to have him placed into assisted living.  [facility]  Would like him to see Neurology and see If he would benefit from dementia meds and have his cerebral atrophy looked into.  Deatra CanterWilliam J Oxford FNP

## 2014-02-22 ENCOUNTER — Telehealth: Payer: Self-pay | Admitting: Family Medicine

## 2014-02-22 NOTE — Telephone Encounter (Signed)
Pt has appt with neurologist 9/22 Would like to see if we can get sooner appt

## 2014-02-23 NOTE — Telephone Encounter (Signed)
Lm on AM with instructions to call the neurologist office to be put on cancellation list. We cannot get it moved up any faster

## 2014-03-29 ENCOUNTER — Telehealth: Payer: Self-pay | Admitting: Neurology

## 2014-03-29 NOTE — Telephone Encounter (Signed)
Pt called to cancel his 03/30/14 NP appt w/ Dr. Everlena Cooper due to him not having his copay or insurance card. Dr. Purcell Nails, referring provider was notified via telephone.

## 2014-03-30 ENCOUNTER — Ambulatory Visit: Payer: Medicare Other | Admitting: Neurology

## 2014-10-11 ENCOUNTER — Ambulatory Visit: Payer: Self-pay | Admitting: Family Medicine

## 2015-01-20 ENCOUNTER — Encounter (INDEPENDENT_AMBULATORY_CARE_PROVIDER_SITE_OTHER): Payer: Self-pay

## 2015-01-20 ENCOUNTER — Encounter: Payer: Self-pay | Admitting: Family Medicine

## 2015-01-20 ENCOUNTER — Ambulatory Visit (INDEPENDENT_AMBULATORY_CARE_PROVIDER_SITE_OTHER): Payer: Medicare Other | Admitting: Family Medicine

## 2015-01-20 VITALS — BP 164/81 | HR 73 | Temp 97.0°F | Ht 72.0 in | Wt 191.4 lb

## 2015-01-20 DIAGNOSIS — IMO0002 Reserved for concepts with insufficient information to code with codable children: Secondary | ICD-10-CM

## 2015-01-20 DIAGNOSIS — I69359 Hemiplegia and hemiparesis following cerebral infarction affecting unspecified side: Secondary | ICD-10-CM

## 2015-01-20 DIAGNOSIS — I10 Essential (primary) hypertension: Secondary | ICD-10-CM | POA: Diagnosis not present

## 2015-01-20 DIAGNOSIS — G8191 Hemiplegia, unspecified affecting right dominant side: Secondary | ICD-10-CM | POA: Diagnosis not present

## 2015-01-20 LAB — POCT CBC
GRANULOCYTE PERCENT: 65.9 % (ref 37–80)
HEMATOCRIT: 43.5 % (ref 43.5–53.7)
HEMOGLOBIN: 14.3 g/dL (ref 14.1–18.1)
LYMPH, POC: 1.9 (ref 0.6–3.4)
MCH, POC: 29.3 pg (ref 27–31.2)
MCHC: 32.8 g/dL (ref 31.8–35.4)
MCV: 89.2 fL (ref 80–97)
MPV: 8.4 fL (ref 0–99.8)
POC Granulocyte: 4.2 (ref 2–6.9)
POC LYMPH %: 29.4 % (ref 10–50)
Platelet Count, POC: 112 10*3/uL — AB (ref 142–424)
RBC: 4.88 M/uL (ref 4.69–6.13)
RDW, POC: 14.1 %
WBC: 6.4 10*3/uL (ref 4.6–10.2)

## 2015-01-20 MED ORDER — CLONIDINE HCL 0.1 MG PO TABS: 0.1000 mg | ORAL_TABLET | Freq: Three times a day (TID) | ORAL | Status: DC

## 2015-01-20 MED ORDER — HYDROCHLOROTHIAZIDE 25 MG PO TABS: 25.0000 mg | ORAL_TABLET | Freq: Every day | ORAL | Status: DC

## 2015-01-20 MED ORDER — IRBESARTAN 300 MG PO TABS: 300.0000 mg | ORAL_TABLET | Freq: Every day | ORAL | Status: DC

## 2015-01-20 NOTE — Progress Notes (Signed)
Subjective:  Patient ID: Warren Sullivan, male    DOB: 04-19-1961  Age: 54 y.o. MRN: [id number]  CC: No chief complaint on file.   HPI Warren Sullivan presents for  follow-up of hypertension. Patient has no history of headache chest pain or shortness of breath or recent cough. Patient states he has had 8 strokes. He has left sided weakness and drags his left foot. He is disabled by this.. Patient does not check blood pressure at home. He has had an elevated reading recently at another doctor's office. Patient denies side effects from his medication. States taking them regularly.   History Dorothy has a past medical history of Hypertension and Stroke.   He has past surgical history that includes Hernia repair.   His family history includes Hypertension in his father, mother, and sister; Stroke in his mother.He reports that he has been smoking.  He does not have any smokeless tobacco history on file. He reports that he does not drink alcohol or use illicit drugs.  No current outpatient prescriptions on file prior to visit.   No current facility-administered medications on file prior to visit.    ROS Review of Systems  Constitutional: Negative for fever, chills and diaphoresis.  HENT: Negative for congestion, rhinorrhea and sore throat.   Respiratory: Negative for cough, shortness of breath and wheezing.   Cardiovascular: Negative for chest pain.  Gastrointestinal: Negative for nausea, vomiting, abdominal pain, diarrhea, constipation and abdominal distention.  Genitourinary: Negative for dysuria and frequency.  Musculoskeletal: Negative for joint swelling and arthralgias.  Skin: Negative for rash.  Neurological: Positive for weakness (left U&L ext.). Negative for dizziness, syncope, numbness and headaches.  Psychiatric/Behavioral: Negative for agitation. The patient is not nervous/anxious.     Objective:  BP 164/81 mmHg  Pulse 73  Temp(Src) 97 F (36.1 C) (Oral)  Ht 6'  (1.829 m)  Wt 191 lb 6.4 oz (86.818 kg)  BMI 25.95 kg/m2  BP Readings from Last 3 Encounters:  01/20/15 164/81  02/17/14 168/90  01/01/14 178/105    Wt Readings from Last 3 Encounters:  01/20/15 191 lb 6.4 oz (86.818 kg)  02/17/14 190 lb (86.183 kg)  01/01/14 182 lb (82.555 kg)     Physical Exam  Constitutional: He is oriented to person, place, and time. He appears well-developed and well-nourished. No distress.  HENT:  Head: Normocephalic and atraumatic.  Right Ear: External ear normal.  Left Ear: External ear normal.  Nose: Nose normal.  Mouth/Throat: Oropharynx is clear and moist.  Eyes: Conjunctivae and EOM are normal. Pupils are equal, round, and reactive to light.  Neck: Normal range of motion. Neck supple. No thyromegaly present.  Cardiovascular: Normal rate, regular rhythm and normal heart sounds.   No murmur heard. Pulmonary/Chest: Effort normal and breath sounds normal. No respiratory distress. He has no wheezes. He has no rales.  Abdominal: Soft. Bowel sounds are normal. He exhibits no distension. There is no tenderness.  Lymphadenopathy:    He has no cervical adenopathy.  Neurological: He is alert and oriented to person, place, and time. He has normal reflexes. He exhibits abnormal muscle tone. Coordination abnormal.  Weak on left side 3-4/5  Skin: Skin is warm and dry.  Psychiatric: He has a normal mood and affect. His behavior is normal. Judgment and thought content normal.    Lab Results  Component Value Date   HGBA1C  10/23/2008    5.7 (NOTE) The ADA recommends the following therapeutic goal for glycemic control related  to Hgb A1c measurement: Goal of therapy: <6.5 Hgb A1c  Reference: American Diabetes Association: Clinical Practice Recommendations 2010, Diabetes Care, 2010, 33: (Suppl  1).    Lab Results  Component Value Date   WBC 6.4 01/20/2015   HGB 14.3 01/20/2015   HCT 43.5 01/20/2015   PLT 129* 02/19/2013   GLUCOSE 84 02/19/2013   CHOL   10/22/2008    157        ATP III CLASSIFICATION:  <200     mg/dL   Desirable  200-239  mg/dL   Borderline High  >=240    mg/dL   High          TRIG 63 10/22/2008   HDL 32* 10/22/2008   LDLCALC * 10/22/2008    112        Total Cholesterol/HDL:CHD Risk Coronary Heart Disease Risk Table                     Men   Women  1/2 Average Risk   3.4   3.3  Average Risk       5.0   4.4  2 X Average Risk   9.6   7.1  3 X Average Risk  23.4   11.0        Use the calculated Patient Ratio above and the CHD Risk Table to determine the patient's CHD Risk.        ATP III CLASSIFICATION (LDL):  <100     mg/dL   Optimal  100-129  mg/dL   Near or Above                    Optimal  130-159  mg/dL   Borderline  160-189  mg/dL   High  >190     mg/dL   Very High   NA 138 02/19/2013   K 3.7 02/19/2013   CL 101 02/19/2013   CREATININE 0.87 02/19/2013   BUN 12 02/19/2013   CO2 28 02/19/2013   INR 1.1 10/21/2008   HGBA1C  10/23/2008    5.7 (NOTE) The ADA recommends the following therapeutic goal for glycemic control related to Hgb A1c measurement: Goal of therapy: <6.5 Hgb A1c  Reference: American Diabetes Association: Clinical Practice Recommendations 2010, Diabetes Care, 2010, 33: (Suppl  1).    Dg Chest 2 View  02/19/2013   *RADIOLOGY REPORT*  Clinical Data: Aphasia, hypertension  CHEST - 2 VIEW  Comparison: None.  Findings: Lung volumes are low with crowding of the bronchovascular markings.  No focal/lobar consolidation.  No pleural effusion. Heart size is normal. Aorta is ectatic and unfolded.  No acute osseous finding.  IMPRESSION: Curvilinear bilateral lobe atelectasis.  No focal acute finding otherwise.   Original Report Authenticated By: Conchita Paris, M.D.   Ct Head Wo Contrast  02/19/2013   *RADIOLOGY REPORT*  Clinical Data: Hypertension.  Altered speech.  History of prior infarcts.  CT HEAD WITHOUT CONTRAST  Technique:  Contiguous axial images were obtained from the base of the skull  through the vertex without contrast.  Comparison: 10/21/2008 MRI.  Findings: Bone windows demonstrate hypoplastic frontal sinuses. Clear mastoid air cells.  Soft tissue windows demonstrate age advanced cerebral atrophy. Cava septum pellucidum.  Moderate and age advanced low density in the periventricular white matter likely related to small vessel disease.Remote lacunar infarcts in the caudate heads bilaterally. No  mass lesion, hemorrhage, hydrocephalus, acute infarct, intra- axial, or extra-axial fluid collection.  IMPRESSION:  1.  Age  advanced cerebral atrophy with small vessel ischemic change.  Remote caudate head lacunar infarcts. 2. No acute intracranial abnormality.   Original Report Authenticated By: Abigail Miyamoto, M.D.    Assessment & Plan:   Diagnoses and all orders for this visit:  Essential hypertension, malignant Orders: -     POCT CBC -     CMP14+EGFR -     Lipid panel -     PSA, total and free -     Vit D  25 hydroxy (rtn osteoporosis monitoring) -     Thyroid Panel With TSH -     cloNIDine (CATAPRES) 0.1 MG tablet; Take 1 tablet (0.1 mg total) by mouth 3 (three) times daily. -     Discontinue: hydrochlorothiazide (HYDRODIURIL) 25 MG tablet; Take 1 tablet (25 mg total) by mouth daily. -     hydrochlorothiazide (HYDRODIURIL) 25 MG tablet; Take 1 tablet (25 mg total) by mouth daily.  Other orders -     irbesartan (AVAPRO) 300 MG tablet; Take 1 tablet (300 mg total) by mouth daily. For Blood pressure   I have discontinued Mr. Tingler meloxicam and polyethylene glycol powder. I am also having him start on irbesartan. Additionally, I am having him maintain his cloNIDine and hydrochlorothiazide.  Meds ordered this encounter  Medications  . cloNIDine (CATAPRES) 0.1 MG tablet    Sig: Take 1 tablet (0.1 mg total) by mouth 3 (three) times daily.    Dispense:  90 tablet    Refill:  3  . DISCONTD: hydrochlorothiazide (HYDRODIURIL) 25 MG tablet    Sig: Take 1 tablet (25 mg total)  by mouth daily.    Dispense:  90 tablet    Refill:  3  . hydrochlorothiazide (HYDRODIURIL) 25 MG tablet    Sig: Take 1 tablet (25 mg total) by mouth daily.    Dispense:  90 tablet    Refill:  3  . irbesartan (AVAPRO) 300 MG tablet    Sig: Take 1 tablet (300 mg total) by mouth daily. For Blood pressure    Dispense:  30 tablet    Refill:  2     Follow-up: Return in about 1 month (around 02/20/2015).  Claretta Fraise, M.D.

## 2015-01-21 LAB — CMP14+EGFR
A/G RATIO: 1.1 (ref 1.1–2.5)
ALT: 8 IU/L (ref 0–44)
AST: 10 IU/L (ref 0–40)
Albumin: 4.2 g/dL (ref 3.5–5.5)
Alkaline Phosphatase: 55 IU/L (ref 39–117)
BUN / CREAT RATIO: 19 (ref 9–20)
BUN: 22 mg/dL (ref 6–24)
Bilirubin Total: 0.5 mg/dL (ref 0.0–1.2)
CHLORIDE: 103 mmol/L (ref 97–108)
CO2: 26 mmol/L (ref 18–29)
CREATININE: 1.14 mg/dL (ref 0.76–1.27)
Calcium: 9.2 mg/dL (ref 8.7–10.2)
GFR calc Af Amer: 83 mL/min/{1.73_m2} (ref >59)
GFR, EST NON AFRICAN AMERICAN: 72 mL/min/{1.73_m2} (ref >59)
GLOBULIN, TOTAL: 3.7 g/dL (ref 1.5–4.5)
Glucose: 99 mg/dL (ref 65–99)
Potassium: 4.2 mmol/L (ref 3.5–5.2)
SODIUM: 144 mmol/L (ref 134–144)
TOTAL PROTEIN: 7.9 g/dL (ref 6.0–8.5)

## 2015-01-21 LAB — PSA, TOTAL AND FREE
PSA, Free Pct: 26.2 %
PSA, Free: 0.34 ng/mL
Prostate Specific Ag, Serum: 1.3 ng/mL (ref 0.0–4.0)

## 2015-01-21 LAB — VITAMIN D 25 HYDROXY (VIT D DEFICIENCY, FRACTURES): VIT D 25 HYDROXY: 27.7 ng/mL — AB (ref 30.0–100.0)

## 2015-01-21 LAB — LIPID PANEL
CHOL/HDL RATIO: 5.1 ratio — AB (ref 0.0–5.0)
CHOLESTEROL TOTAL: 214 mg/dL — AB (ref 100–199)
HDL: 42 mg/dL (ref >39)
LDL Calculated: 139 mg/dL — ABNORMAL HIGH (ref 0–99)
Triglycerides: 164 mg/dL — ABNORMAL HIGH (ref 0–149)
VLDL CHOLESTEROL CAL: 33 mg/dL (ref 5–40)

## 2015-01-21 LAB — THYROID PANEL WITH TSH
Free Thyroxine Index: 2.3 (ref 1.2–4.9)
T3 Uptake Ratio: 27 % (ref 24–39)
T4, Total: 8.7 ug/dL (ref 4.5–12.0)
TSH: 2.17 u[IU]/mL (ref 0.450–4.500)

## 2015-01-24 ENCOUNTER — Other Ambulatory Visit: Payer: Self-pay | Admitting: Family Medicine

## 2015-01-24 MED ORDER — VITAMIN D (ERGOCALCIFEROL) 1.25 MG (50000 UNIT) PO CAPS: 50000.0000 [IU] | ORAL_CAPSULE | ORAL | Status: DC

## 2015-03-09 ENCOUNTER — Ambulatory Visit (INDEPENDENT_AMBULATORY_CARE_PROVIDER_SITE_OTHER): Payer: Medicare Other | Admitting: Family Medicine

## 2015-03-09 ENCOUNTER — Encounter: Payer: Self-pay | Admitting: Family Medicine

## 2015-03-09 VITALS — BP 125/74 | HR 72 | Temp 97.2°F | Ht 72.0 in | Wt 190.6 lb

## 2015-03-09 DIAGNOSIS — I10 Essential (primary) hypertension: Secondary | ICD-10-CM

## 2015-03-09 MED ORDER — IRBESARTAN 300 MG PO TABS: 300.0000 mg | ORAL_TABLET | Freq: Every day | ORAL | Status: DC

## 2015-03-09 NOTE — Progress Notes (Signed)
Subjective:  Patient ID: Warren Sullivan, male    DOB: [birth year]  Age: 54 y.o. MRN: [id number]  CC: Hypertension   HPI TIGHE GITTO presents for  follow-up of hypertension. Patient has no history of headache chest pain or shortness of breath or recent cough. Patient also denies symptoms of TIA such as numbness weakness lateralizing. Patient checks  blood pressure at home and has not had any elevated readings recently. Patient denies side effects from his medication. States taking it regularly.   History Curt has a past medical history of Hypertension and Stroke.   He has past surgical history that includes Hernia repair.   His family history includes Hypertension in his father, mother, and sister; Stroke in his mother.He reports that he has been smoking.  He does not have any smokeless tobacco history on file. He reports that he does not drink alcohol or use illicit drugs.  Current Outpatient Prescriptions on File Prior to Visit  Medication Sig Dispense Refill  . cloNIDine (CATAPRES) 0.1 MG tablet Take 1 tablet (0.1 mg total) by mouth 3 (three) times daily. 90 tablet 3  . hydrochlorothiazide (HYDRODIURIL) 25 MG tablet Take 1 tablet (25 mg total) by mouth daily. 90 tablet 3  . Vitamin D, Ergocalciferol, (DRISDOL) 50000 UNITS CAPS capsule Take 1 capsule (50,000 Units total) by mouth 2 (two) times a week. 16 capsule 0   No current facility-administered medications on file prior to visit.    ROS Review of Systems  Constitutional: Negative for fever, chills and diaphoresis.  HENT: Negative for congestion, rhinorrhea and sore throat.   Respiratory: Negative for cough, shortness of breath and wheezing.   Cardiovascular: Negative for chest pain.  Gastrointestinal: Negative for nausea, vomiting, abdominal pain, diarrhea, constipation and abdominal distention.  Genitourinary: Negative for dysuria and frequency.  Musculoskeletal: Negative for joint swelling and arthralgias.  Skin:  Negative for rash.  Neurological: Negative for headaches.    Objective:  BP 125/74 mmHg  Pulse 72  Temp(Src) 97.2 F (36.2 C) (Oral)  Ht 6' (1.829 m)  Wt 190 lb 9.6 oz (86.456 kg)  BMI 25.84 kg/m2  BP Readings from Last 3 Encounters:  03/09/15 125/74  01/20/15 164/81  02/17/14 168/90    Wt Readings from Last 3 Encounters:  03/09/15 190 lb 9.6 oz (86.456 kg)  01/20/15 191 lb 6.4 oz (86.818 kg)  02/17/14 190 lb (86.183 kg)     Physical Exam  Constitutional: He is oriented to person, place, and time. He appears well-developed and well-nourished. No distress.  HENT:  Head: Normocephalic and atraumatic.  Right Ear: External ear normal.  Left Ear: External ear normal.  Nose: Nose normal.  Mouth/Throat: Oropharynx is clear and moist.  Eyes: Conjunctivae and EOM are normal. Pupils are equal, round, and reactive to light.  Neck: Normal range of motion. Neck supple. No thyromegaly present.  Cardiovascular: Normal rate, regular rhythm and normal heart sounds.   No murmur heard. Pulmonary/Chest: Effort normal and breath sounds normal. No respiratory distress. He has no wheezes. He has no rales.  Abdominal: Soft. Bowel sounds are normal. He exhibits no distension. There is no tenderness.  Lymphadenopathy:    He has no cervical adenopathy.  Neurological: He is alert and oriented to person, place, and time. He has normal reflexes.  Skin: Skin is warm and dry.  Psychiatric: He has a normal mood and affect. His behavior is normal. Judgment and thought content normal.    Lab Results  Component Value Date  HGBA1C  10/23/2008    5.7 (NOTE) The ADA recommends the following therapeutic goal for glycemic control related to Hgb A1c measurement: Goal of therapy: <6.5 Hgb A1c  Reference: American Diabetes Association: Clinical Practice Recommendations 2010, Diabetes Care, 2010, 33: (Suppl  1).    Lab Results  Component Value Date   WBC 6.4 01/20/2015   HGB 14.3 01/20/2015   HCT 43.5  01/20/2015   PLT 129* 02/19/2013   GLUCOSE 102* 03/09/2015   CHOL 214* 01/20/2015   TRIG 164* 01/20/2015   HDL 42 01/20/2015   LDLCALC 139* 01/20/2015   ALT 8 01/20/2015   AST 10 01/20/2015   NA 138 03/09/2015   K 4.1 03/09/2015   CL 95* 03/09/2015   CREATININE 0.86 03/09/2015   BUN 23 03/09/2015   CO2 26 03/09/2015   TSH 2.170 01/20/2015   PSA 1.3 01/20/2015   INR 1.1 10/21/2008   HGBA1C  10/23/2008    5.7 (NOTE) The ADA recommends the following therapeutic goal for glycemic control related to Hgb A1c measurement: Goal of therapy: <6.5 Hgb A1c  Reference: American Diabetes Association: Clinical Practice Recommendations 2010, Diabetes Care, 2010, 33: (Suppl  1).    Dg Chest 2 View  02/19/2013   *RADIOLOGY REPORT*  Clinical Data: Aphasia, hypertension  CHEST - 2 VIEW  Comparison: None.  Findings: Lung volumes are low with crowding of the bronchovascular markings.  No focal/lobar consolidation.  No pleural effusion. Heart size is normal. Aorta is ectatic and unfolded.  No acute osseous finding.  IMPRESSION: Curvilinear bilateral lobe atelectasis.  No focal acute finding otherwise.   Original Report Authenticated By: Conchita Paris, M.D.   Ct Head Wo Contrast  02/19/2013   *RADIOLOGY REPORT*  Clinical Data: Hypertension.  Altered speech.  History of prior infarcts.  CT HEAD WITHOUT CONTRAST  Technique:  Contiguous axial images were obtained from the base of the skull through the vertex without contrast.  Comparison: 10/21/2008 MRI.  Findings: Bone windows demonstrate hypoplastic frontal sinuses. Clear mastoid air cells.  Soft tissue windows demonstrate age advanced cerebral atrophy. Cava septum pellucidum.  Moderate and age advanced low density in the periventricular white matter likely related to small vessel disease.Remote lacunar infarcts in the caudate heads bilaterally. No  mass lesion, hemorrhage, hydrocephalus, acute infarct, intra- axial, or extra-axial fluid collection.  IMPRESSION:   1.  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  Remote caudate head lacunar infarcts. 2. No acute intracranial abnormality.   Original Report Authenticated By: Abigail Miyamoto, M.D.    Assessment & Plan:   Melody was seen today for hypertension.  Diagnoses and all orders for this visit:  Essential hypertension -     BMP8+EGFR  Other orders -     irbesartan (AVAPRO) 300 MG tablet; Take 1 tablet (300 mg total) by mouth daily. For Blood pressure   I am having Mr. Aprea maintain his cloNIDine, hydrochlorothiazide, Vitamin D (Ergocalciferol), and irbesartan.  Meds ordered this encounter  Medications  . irbesartan (AVAPRO) 300 MG tablet    Sig: Take 1 tablet (300 mg total) by mouth daily. For Blood pressure    Dispense:  90 tablet    Refill:  3   Low-fat diet handout printed for the patient.  Follow-up: Return in about 3 months (around 06/08/2015).  Claretta Fraise, M.D.

## 2015-03-09 NOTE — Patient Instructions (Signed)
Fat and Cholesterol Control Diet Fat and cholesterol levels in your blood and organs are influenced by your diet. High levels of fat and cholesterol may lead to diseases of the heart, small and large blood vessels, gallbladder, liver, and pancreas. CONTROLLING FAT AND CHOLESTEROL WITH DIET Although exercise and lifestyle factors are important, your diet is key. That is because certain foods are known to raise cholesterol and others to lower it. The goal is to balance foods for their effect on cholesterol and more importantly, to replace saturated and trans fat with other types of fat, such as monounsaturated fat, polyunsaturated fat, and omega-3 fatty acids. On average, a person should consume no more than 15 to 17 g of saturated fat daily. Saturated and trans fats are considered "bad" fats, and they will raise LDL cholesterol. Saturated fats are primarily found in animal products such as meats, butter, and cream. However, that does not mean you need to give up all your favorite foods. Today, there are good tasting, low-fat, low-cholesterol substitutes for most of the things you like to eat. Choose low-fat or nonfat alternatives. Choose round or loin cuts of red meat. These types of cuts are lowest in fat and cholesterol. Chicken (without the skin), fish, veal, and ground turkey breast are great choices. Eliminate fatty meats, such as hot dogs and salami. Even shellfish have little or no saturated fat. Have a 3 oz (85 g) portion when you eat lean meat, poultry, or fish. Trans fats are also called "partially hydrogenated oils." They are oils that have been scientifically manipulated so that they are solid at room temperature resulting in a longer shelf life and improved taste and texture of foods in which they are added. Trans fats are found in stick margarine, some tub margarines, cookies, crackers, and baked goods.  When baking and cooking, oils are a great substitute for butter. The monounsaturated oils are  especially beneficial since it is believed they lower LDL and raise HDL. The oils you should avoid entirely are saturated tropical oils, such as coconut and palm.  Remember to eat a lot from food groups that are naturally free of saturated and trans fat, including fish, fruit, vegetables, beans, grains (barley, rice, couscous, bulgur wheat), and pasta (without cream sauces).  IDENTIFYING FOODS THAT LOWER FAT AND CHOLESTEROL  Soluble fiber may lower your cholesterol. This type of fiber is found in fruits such as apples, vegetables such as broccoli, potatoes, and carrots, legumes such as beans, peas, and lentils, and grains such as barley. Foods fortified with plant sterols (phytosterol) may also lower cholesterol. You should eat at least 2 g per day of these foods for a cholesterol lowering effect.  Read package labels to identify low-saturated fats, trans fat free, and low-fat foods at the supermarket. Select cheeses that have only 2 to 3 g saturated fat per ounce. Use a heart-healthy tub margarine that is free of trans fats or partially hydrogenated oil. When buying baked goods (cookies, crackers), avoid partially hydrogenated oils. Breads and muffins should be made from whole grains (whole-wheat or whole oat flour, instead of "flour" or "enriched flour"). Buy non-creamy canned soups with reduced salt and no added fats.  FOOD PREPARATION TECHNIQUES  Never deep-fry. If you must fry, either stir-fry, which uses very little fat, or use non-stick cooking sprays. When possible, broil, bake, or roast meats, and steam vegetables. Instead of putting butter or margarine on vegetables, use lemon and herbs, applesauce, and cinnamon (for squash and sweet potatoes). Use nonfat   yogurt, salsa, and low-fat dressings for salads.  LOW-SATURATED FAT / LOW-FAT FOOD SUBSTITUTES Meats / Saturated Fat (g)  Avoid: Steak, marbled (3 oz/85 g) / 11 g  Choose: Steak, lean (3 oz/85 g) / 4 g  Avoid: Hamburger (3 oz/85 g) / 7  g  Choose: Hamburger, lean (3 oz/85 g) / 5 g  Avoid: Ham (3 oz/85 g) / 6 g  Choose: Ham, lean cut (3 oz/85 g) / 2.4 g  Avoid: Chicken, with skin, dark meat (3 oz/85 g) / 4 g  Choose: Chicken, skin removed, dark meat (3 oz/85 g) / 2 g  Avoid: Chicken, with skin, light meat (3 oz/85 g) / 2.5 g  Choose: Chicken, skin removed, light meat (3 oz/85 g) / 1 g Dairy / Saturated Fat (g)  Avoid: Whole milk (1 cup) / 5 g  Choose: Low-fat milk, 2% (1 cup) / 3 g  Choose: Low-fat milk, 1% (1 cup) / 1.5 g  Choose: Skim milk (1 cup) / 0.3 g  Avoid: Hard cheese (1 oz/28 g) / 6 g  Choose: Skim milk cheese (1 oz/28 g) / 2 to 3 g  Avoid: Cottage cheese, 4% fat (1 cup) / 6.5 g  Choose: Low-fat cottage cheese, 1% fat (1 cup) / 1.5 g  Avoid: Ice cream (1 cup) / 9 g  Choose: Sherbet (1 cup) / 2.5 g  Choose: Nonfat frozen yogurt (1 cup) / 0.3 g  Choose: Frozen fruit bar / trace  Avoid: Whipped cream (1 tbs) / 3.5 g  Choose: Nondairy whipped topping (1 tbs) / 1 g Condiments / Saturated Fat (g)  Avoid: Mayonnaise (1 tbs) / 2 g  Choose: Low-fat mayonnaise (1 tbs) / 1 g  Avoid: Butter (1 tbs) / 7 g  Choose: Extra light margarine (1 tbs) / 1 g  Avoid: Coconut oil (1 tbs) / 11.8 g  Choose: Olive oil (1 tbs) / 1.8 g  Choose: Corn oil (1 tbs) / 1.7 g  Choose: Safflower oil (1 tbs) / 1.2 g  Choose: Sunflower oil (1 tbs) / 1.4 g  Choose: Soybean oil (1 tbs) / 2.4 g  Choose: Canola oil (1 tbs) / 1 g Document Released: 06/25/2005 Document Revised: 10/20/2012 Document Reviewed: 09/23/2013 [employer] Patient Information 2015 [employer]. This information is not intended to replace advice given to you by your health care provider. Make sure you discuss any questions you have with your health care provider.  

## 2015-03-10 LAB — BMP8+EGFR
BUN / CREAT RATIO: 27 — AB (ref 9–20)
BUN: 23 mg/dL (ref 6–24)
CALCIUM: 9.6 mg/dL (ref 8.7–10.2)
CHLORIDE: 95 mmol/L — AB (ref 97–108)
CO2: 26 mmol/L (ref 18–29)
Creatinine, Ser: 0.86 mg/dL (ref 0.76–1.27)
GFR calc non Af Amer: 98 mL/min/{1.73_m2} (ref >59)
GFR, EST AFRICAN AMERICAN: 113 mL/min/{1.73_m2} (ref >59)
Glucose: 102 mg/dL — ABNORMAL HIGH (ref 65–99)
POTASSIUM: 4.1 mmol/L (ref 3.5–5.2)
SODIUM: 138 mmol/L (ref 134–144)

## 2015-03-11 ENCOUNTER — Telehealth: Payer: Self-pay | Admitting: Family Medicine

## 2015-03-12 ENCOUNTER — Encounter: Payer: Self-pay | Admitting: *Deleted

## 2015-06-14 ENCOUNTER — Ambulatory Visit (INDEPENDENT_AMBULATORY_CARE_PROVIDER_SITE_OTHER): Payer: Medicare Other | Admitting: Family Medicine

## 2015-06-14 ENCOUNTER — Encounter: Payer: Self-pay | Admitting: Family Medicine

## 2015-06-14 VITALS — BP 224/114 | HR 70 | Temp 96.9°F | Ht 72.0 in | Wt 202.0 lb

## 2015-06-14 DIAGNOSIS — I10 Essential (primary) hypertension: Secondary | ICD-10-CM | POA: Diagnosis not present

## 2015-06-14 MED ORDER — CLONIDINE HCL 0.2 MG PO TABS: 0.2000 mg | ORAL_TABLET | Freq: Three times a day (TID) | ORAL | Status: DC

## 2015-06-14 NOTE — Progress Notes (Signed)
Subjective:  Patient ID: Warren Sullivan, male    DOB: July 23, 1960  Age: 54 y.o. MRN: [id number]  CC: Hypertension   HPI Warren GravelJohnny G Sullivan presents for  follow-up of hypertension. Patient has no history of headache chest pain or shortness of breath or recent cough. Patient also denies symptoms of TIA such as numbness weakness lateralizing. Patient checks  blood pressure at home and has not had any elevated readings recently. Patient denies side effects from his medication. States taking it regularly.   History Warren BerkshireJohnny has a past medical history of Hypertension and Stroke (HCC).   He has past surgical history that includes Hernia repair.   His family history includes Hypertension in his father, mother, and sister; Stroke in his mother.He reports that he has been smoking.  He does not have any smokeless tobacco history on file. He reports that he does not drink alcohol or use illicit drugs.  Current Outpatient Prescriptions on File Prior to Visit  Medication Sig Dispense Refill  . hydrochlorothiazide (HYDRODIURIL) 25 MG tablet Take 1 tablet (25 mg total) by mouth daily. 90 tablet 3  . irbesartan (AVAPRO) 300 MG tablet Take 1 tablet (300 mg total) by mouth daily. For Blood pressure 90 tablet 3  . Vitamin D, Ergocalciferol, (DRISDOL) 50000 UNITS CAPS capsule Take 1 capsule (50,000 Units total) by mouth 2 (two) times a week. (Patient not taking: Reported on 06/14/2015) 16 capsule 0   No current facility-administered medications on file prior to visit.    ROS Review of Systems  Constitutional: Negative for fever, chills, diaphoresis and unexpected weight change.  HENT: Negative for congestion, hearing loss, rhinorrhea and sore throat.   Eyes: Negative for visual disturbance.  Respiratory: Negative for cough and shortness of breath.   Cardiovascular: Negative for chest pain.  Gastrointestinal: Negative for abdominal pain, diarrhea and constipation.  Genitourinary: Negative for dysuria and  flank pain.  Musculoskeletal: Negative for joint swelling and arthralgias.  Skin: Negative for rash.  Neurological: Negative for dizziness and headaches.  Psychiatric/Behavioral: Negative for sleep disturbance and dysphoric mood.    Objective:  BP 224/114 mmHg  Pulse 70  Temp(Src) 96.9 F (36.1 C) (Oral)  Ht 6' (1.829 m)  Wt 202 lb (91.627 kg)  BMI 27.39 kg/m2  SpO2 96%  BP Readings from Last 3 Encounters:  06/14/15 224/114  03/09/15 125/74  01/20/15 164/81    Wt Readings from Last 3 Encounters:  06/14/15 202 lb (91.627 kg)  03/09/15 190 lb 9.6 oz (86.456 kg)  01/20/15 191 lb 6.4 oz (86.818 kg)     Physical Exam  Constitutional: He is oriented to person, place, and time. He appears well-developed and well-nourished. No distress.  HENT:  Head: Normocephalic and atraumatic.  Right Ear: External ear normal.  Left Ear: External ear normal.  Nose: Nose normal.  Mouth/Throat: Oropharynx is clear and moist.  Eyes: Conjunctivae and EOM are normal. Pupils are equal, round, and reactive to light.  Neck: Normal range of motion. Neck supple. No thyromegaly present.  Cardiovascular: Normal rate, regular rhythm and normal heart sounds.   No murmur heard. Pulmonary/Chest: Effort normal and breath sounds normal. No respiratory distress. He has no wheezes. He has no rales.  Abdominal: Soft. Bowel sounds are normal. He exhibits no distension. There is no tenderness.  Lymphadenopathy:    He has no cervical adenopathy.  Neurological: He is alert and oriented to person, place, and time. He has normal reflexes.  Skin: Skin is warm and dry.  Psychiatric:  He has a normal mood and affect. His behavior is normal. Judgment and thought content normal.    Lab Results  Component Value Date   HGBA1C  10/23/2008    5.7 (NOTE) The ADA recommends the following therapeutic goal for glycemic control related to Hgb A1c measurement: Goal of therapy: <6.5 Hgb A1c  Reference: American Diabetes  Association: Clinical Practice Recommendations 2010, Diabetes Care, 2010, 33: (Suppl  1).    Lab Results  Component Value Date   WBC 6.4 01/20/2015   HGB 14.3 01/20/2015   HCT 43.5 01/20/2015   PLT 129* 02/19/2013   GLUCOSE 102* 03/09/2015   CHOL 214* 01/20/2015   TRIG 164* 01/20/2015   HDL 42 01/20/2015   LDLCALC 139* 01/20/2015   ALT 8 01/20/2015   AST 10 01/20/2015   NA 138 03/09/2015   K 4.1 03/09/2015   CL 95* 03/09/2015   CREATININE 0.86 03/09/2015   BUN 23 03/09/2015   CO2 26 03/09/2015   TSH 2.170 01/20/2015   PSA 1.3 01/20/2015   INR 1.1 10/21/2008   HGBA1C  10/23/2008    5.7 (NOTE) The ADA recommends the following therapeutic goal for glycemic control related to Hgb A1c measurement: Goal of therapy: <6.5 Hgb A1c  Reference: American Diabetes Association: Clinical Practice Recommendations 2010, Diabetes Care, 2010, 33: (Suppl  1).    Dg Chest 2 View  02/19/2013  *RADIOLOGY REPORT* Clinical Data: Aphasia, hypertension CHEST - 2 VIEW Comparison: None. Findings: Lung volumes are low with crowding of the bronchovascular markings.  No focal/lobar consolidation.  No pleural effusion. Heart size is normal. Aorta is ectatic and unfolded.  No acute osseous finding. IMPRESSION: Curvilinear bilateral lobe atelectasis.  No focal acute finding otherwise. Original Report Authenticated By: Christiana Pellant, M.D.   Ct Head Wo Contrast  02/19/2013  *RADIOLOGY REPORT* Clinical Data: Hypertension.  Altered speech.  History of prior infarcts. CT HEAD WITHOUT CONTRAST Technique:  Contiguous axial images were obtained from the base of the skull through the vertex without contrast. Comparison: 10/21/2008 MRI. Findings: Bone windows demonstrate hypoplastic frontal sinuses. Clear mastoid air cells. Soft tissue windows demonstrate age advanced cerebral atrophy. Cava septum pellucidum.  Moderate and age advanced low density in the periventricular white matter likely related to small vessel  disease.Remote lacunar infarcts in the caudate heads bilaterally. No  mass lesion, hemorrhage, hydrocephalus, acute infarct, intra- axial, or extra-axial fluid collection. IMPRESSION: 1.  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  Remote caudate head lacunar infarcts. 2. No acute intracranial abnormality. Original Report Authenticated By: Jeronimo Greaves, M.D.    Assessment & Plan:   [place] was seen today for hypertension.  Diagnoses and all orders for this visit:  Essential hypertension, malignant -     cloNIDine (CATAPRES) 0.2 MG tablet; Take 1 tablet (0.2 mg total) by mouth 3 (three) times daily.   I have changed Mr. Jakob cloNIDine. I am also having him maintain his hydrochlorothiazide, Vitamin D (Ergocalciferol), and irbesartan.  Meds ordered this encounter  Medications  . cloNIDine (CATAPRES) 0.2 MG tablet    Sig: Take 1 tablet (0.2 mg total) by mouth 3 (three) times daily.    Dispense:  30 tablet    Refill:  0     Follow-up: Return in about 7 days (around 06/21/2015), or if symptoms worsen or fail to improve, for hypertension.  Mechele Claude, M.D.

## 2015-06-21 ENCOUNTER — Ambulatory Visit (INDEPENDENT_AMBULATORY_CARE_PROVIDER_SITE_OTHER): Payer: Medicare Other | Admitting: Family Medicine

## 2015-06-21 ENCOUNTER — Encounter: Payer: Self-pay | Admitting: Family Medicine

## 2015-06-21 VITALS — BP 142/83 | HR 60 | Temp 97.4°F | Wt 201.0 lb

## 2015-06-21 DIAGNOSIS — F17200 Nicotine dependence, unspecified, uncomplicated: Secondary | ICD-10-CM

## 2015-06-21 DIAGNOSIS — I10 Essential (primary) hypertension: Secondary | ICD-10-CM

## 2015-06-21 DIAGNOSIS — Z720 Tobacco use: Secondary | ICD-10-CM | POA: Diagnosis not present

## 2015-06-21 DIAGNOSIS — Z1211 Encounter for screening for malignant neoplasm of colon: Secondary | ICD-10-CM | POA: Diagnosis not present

## 2015-06-21 MED ORDER — VARENICLINE TARTRATE 0.5 MG X 11 & 1 MG X 42 PO MISC
ORAL | Status: DC
Start: 2015-06-21 — End: 2015-10-19

## 2015-06-21 NOTE — Progress Notes (Signed)
Subjective:  Patient ID: Warren Sullivan, male    DOB: 10-30-1960  Age: 54 y.o. MRN: [id number]  CC: Hypertension   HPI Warren Sullivan presents for  follow-up of hypertension. Patient has no history of headache chest pain or shortness of breath or recent cough. Patient also denies symptoms of TIA such as numbness weakness lateralizing. Patient checks  blood pressure at home and has not had any elevated readings recently. Patient denies side effects from his medication. States taking it regularly.   History Warren Sullivan has a past medical history of Hypertension and Stroke (HCC).   He has past surgical history that includes Hernia repair.   His family history includes Hypertension in his father, mother, and sister; Stroke in his mother.He reports that he has been smoking.  He does not have any smokeless tobacco history on file. He reports that he does not drink alcohol or use illicit drugs.  Current Outpatient Prescriptions on File Prior to Visit  Medication Sig Dispense Refill  . cloNIDine (CATAPRES) 0.2 MG tablet Take 1 tablet (0.2 mg total) by mouth 3 (three) times daily. 30 tablet 0  . hydrochlorothiazide (HYDRODIURIL) 25 MG tablet Take 1 tablet (25 mg total) by mouth daily. 90 tablet 3  . irbesartan (AVAPRO) 300 MG tablet Take 1 tablet (300 mg total) by mouth daily. For Blood pressure 90 tablet 3   No current facility-administered medications on file prior to visit.    ROS Review of Systems  Constitutional: Negative for fever, chills, diaphoresis and unexpected weight change.  HENT: Negative for congestion, hearing loss, rhinorrhea and sore throat.   Eyes: Negative for visual disturbance.  Respiratory: Negative for cough and shortness of breath.   Cardiovascular: Negative for chest pain.  Gastrointestinal: Negative for abdominal pain, diarrhea and constipation.  Genitourinary: Negative for dysuria and flank pain.  Skin: Negative for rash.  Neurological: Negative for dizziness  and headaches.    Objective:  BP 142/83 mmHg  Pulse 60  Temp(Src) 97.4 F (36.3 C) (Oral)  Wt 201 lb (91.173 kg)  SpO2 98%  BP Readings from Last 3 Encounters:  06/21/15 142/83  06/14/15 224/114  03/09/15 125/74    Wt Readings from Last 3 Encounters:  06/21/15 201 lb (91.173 kg)  06/14/15 202 lb (91.627 kg)  03/09/15 190 lb 9.6 oz (86.456 kg)     Physical Exam  Constitutional: He is oriented to person, place, and time. He appears well-developed and well-nourished. No distress.  HENT:  Head: Normocephalic and atraumatic.  Right Ear: External ear normal.  Left Ear: External ear normal.  Nose: Nose normal.  Mouth/Throat: Oropharynx is clear and moist.  Eyes: Conjunctivae and EOM are normal. Pupils are equal, round, and reactive to light.  Neck: Normal range of motion. Neck supple. No thyromegaly present.  Cardiovascular: Normal rate, regular rhythm and normal heart sounds.   No murmur heard. Pulmonary/Chest: Effort normal and breath sounds normal. No respiratory distress. He has no wheezes. He has no rales.  Abdominal: Soft. Bowel sounds are normal. He exhibits no distension. There is no tenderness.  Lymphadenopathy:    He has no cervical adenopathy.  Neurological: He is alert and oriented to person, place, and time. He has normal reflexes.  Skin: Skin is warm and dry.  Psychiatric: He has a normal mood and affect. His behavior is normal. Judgment and thought content normal.    Lab Results  Component Value Date   HGBA1C  10/23/2008    5.7 (NOTE) The ADA recommends  the following therapeutic goal for glycemic control related to Hgb A1c measurement: Goal of therapy: <6.5 Hgb A1c  Reference: American Diabetes Association: Clinical Practice Recommendations 2010, Diabetes Care, 2010, 33: (Suppl  1).    Lab Results  Component Value Date   WBC 6.4 01/20/2015   HGB 14.3 01/20/2015   HCT 43.5 01/20/2015   PLT 129* 02/19/2013   GLUCOSE 102* 03/09/2015   CHOL 214*  01/20/2015   TRIG 164* 01/20/2015   HDL 42 01/20/2015   LDLCALC 139* 01/20/2015   ALT 8 01/20/2015   AST 10 01/20/2015   NA 138 03/09/2015   K 4.1 03/09/2015   CL 95* 03/09/2015   CREATININE 0.86 03/09/2015   BUN 23 03/09/2015   CO2 26 03/09/2015   TSH 2.170 01/20/2015   PSA 1.3 01/20/2015   INR 1.1 10/21/2008   HGBA1C  10/23/2008    5.7 (NOTE) The ADA recommends the following therapeutic goal for glycemic control related to Hgb A1c measurement: Goal of therapy: <6.5 Hgb A1c  Reference: American Diabetes Association: Clinical Practice Recommendations 2010, Diabetes Care, 2010, 33: (Suppl  1).    Dg Chest 2 View  02/19/2013  *RADIOLOGY REPORT* Clinical Data: Aphasia, hypertension CHEST - 2 VIEW Comparison: None. Findings: Lung volumes are low with crowding of the bronchovascular markings.  No focal/lobar consolidation.  No pleural effusion. Heart size is normal. Aorta is ectatic and unfolded.  No acute osseous finding. IMPRESSION: Curvilinear bilateral lobe atelectasis.  No focal acute finding otherwise. Original Report Authenticated By: Christiana Pellant, M.D.   Ct Head Wo Contrast  02/19/2013  *RADIOLOGY REPORT* Clinical Data: Hypertension.  Altered speech.  History of prior infarcts. CT HEAD WITHOUT CONTRAST Technique:  Contiguous axial images were obtained from the base of the skull through the vertex without contrast. Comparison: 10/21/2008 MRI. Findings: Bone windows demonstrate hypoplastic frontal sinuses. Clear mastoid air cells. Soft tissue windows demonstrate age advanced cerebral atrophy. Cava septum pellucidum.  Moderate and age advanced low density in the periventricular white matter likely related to small vessel disease.Remote lacunar infarcts in the caudate heads bilaterally. No  mass lesion, hemorrhage, hydrocephalus, acute infarct, intra- axial, or extra-axial fluid collection. IMPRESSION: 1.  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  Remote caudate head  lacunar infarcts. 2. No acute intracranial abnormality. Original Report Authenticated By: Jeronimo Greaves, M.D.    Assessment & Plan:   Warren Sullivan was seen today for hypertension.  Diagnoses and all orders for this visit:  Current smoker  Essential hypertension  I have discontinued Mr. [place]wood Vitamin D (Ergocalciferol). I am also having him maintain his hydrochlorothiazide, irbesartan, and cloNIDine.  No orders of the defined types were placed in this encounter.     Follow-up: Return in about 4 weeks (around 07/19/2015) for hypertension.  Mechele Claude, M.D.

## 2015-06-21 NOTE — Progress Notes (Signed)
Subjective:  Patient ID: Warren Sullivan, male    DOB: [birth year]  Age: 54 y.o. MRN: [id number]  CC: Hypertension   HPI Warren Sullivan presents for  follow-up of hypertension. Patient has no history of headache chest pain or shortness of breath or recent cough. Patient also denies symptoms of TIA such as numbness weakness lateralizingPatient denies side effects from his medication. States taking it regularly. We discussed smoking cessation. Warren Sullivan supposes Warren Sullivan should quit some time.   History Warren Sullivan has a past medical history of Hypertension and Stroke (HCC).   Warren Sullivan has past surgical history that includes Hernia repair.   His family history includes Hypertension in his father, mother, and sister; Stroke in his mother.Warren Sullivan reports that Warren Sullivan has been smoking.  Warren Sullivan does not have any smokeless tobacco history on file. Warren Sullivan reports that Warren Sullivan does not drink alcohol or use illicit drugs.  Current Outpatient Prescriptions on File Prior to Visit  Medication Sig Dispense Refill  . cloNIDine (CATAPRES) 0.2 MG tablet Take 1 tablet (0.2 mg total) by mouth 3 (three) times daily. 30 tablet 0  . hydrochlorothiazide (HYDRODIURIL) 25 MG tablet Take 1 tablet (25 mg total) by mouth daily. 90 tablet 3  . irbesartan (AVAPRO) 300 MG tablet Take 1 tablet (300 mg total) by mouth daily. For Blood pressure 90 tablet 3   No current facility-administered medications on file prior to visit.    ROS Review of Systems  Constitutional: Negative for fever, chills and diaphoresis.  HENT: Negative for rhinorrhea and sore throat.   Respiratory: Negative for cough and shortness of breath.   Cardiovascular: Negative for chest pain.  Gastrointestinal: Negative for abdominal pain.  Musculoskeletal: Negative for myalgias and arthralgias.  Skin: Negative for rash.  Neurological: Negative for weakness and headaches.    Objective:  There were no vitals taken for this visit.  BP Readings from Last 3 Encounters:  06/21/15 142/83    06/14/15 224/114  03/09/15 125/74    Wt Readings from Last 3 Encounters:  06/21/15 201 lb (91.173 kg)  06/14/15 202 lb (91.627 kg)  03/09/15 190 lb 9.6 oz (86.456 kg)     Physical Exam  Constitutional: Warren Sullivan appears well-developed and well-nourished.  HENT:  Head: Normocephalic and atraumatic.  Right Ear: Tympanic membrane and external ear normal. No decreased hearing is noted.  Left Ear: Tympanic membrane and external ear normal. No decreased hearing is noted.  Mouth/Throat: No oropharyngeal exudate or posterior oropharyngeal erythema.  Eyes: Pupils are equal, round, and reactive to light.  Neck: Normal range of motion. Neck supple.  Cardiovascular: Normal rate and regular rhythm.   No murmur heard. Pulmonary/Chest: Breath sounds normal. No respiratory distress.  Abdominal: Soft. Bowel sounds are normal. Warren Sullivan exhibits no mass. There is no tenderness.  Vitals reviewed.   Lab Results  Component Value Date   HGBA1C  10/23/2008    5.7 (NOTE) The ADA recommends the following therapeutic goal for glycemic control related to Hgb A1c measurement: Goal of therapy: <6.5 Hgb A1c  Reference: American Diabetes Association: Clinical Practice Recommendations 2010, Diabetes Care, 2010, 33: (Suppl  1).    Lab Results  Component Value Date   WBC 6.4 01/20/2015   HGB 14.3 01/20/2015   HCT 43.5 01/20/2015   PLT 129* 02/19/2013   GLUCOSE 102* 03/09/2015   CHOL 214* 01/20/2015   TRIG 164* 01/20/2015   HDL 42 01/20/2015   LDLCALC 139* 01/20/2015   ALT 8 01/20/2015   AST 10 01/20/2015   NA  138 03/09/2015   K 4.1 03/09/2015   CL 95* 03/09/2015   CREATININE 0.86 03/09/2015   BUN 23 03/09/2015   CO2 26 03/09/2015   TSH 2.170 01/20/2015   PSA 1.3 01/20/2015   INR 1.1 10/21/2008   HGBA1C  10/23/2008    5.7 (NOTE) The ADA recommends the following therapeutic goal for glycemic control related to Hgb A1c measurement: Goal of therapy: <6.5 Hgb A1c  Reference: American Diabetes Association:  Clinical Practice Recommendations 2010, Diabetes Care, 2010, 33: (Suppl  1).    Dg Chest 2 View  02/19/2013  *RADIOLOGY REPORT* Clinical Data: Aphasia, hypertension CHEST - 2 VIEW Comparison: None. Findings: Lung volumes are low with crowding of the bronchovascular markings.  No focal/lobar consolidation.  No pleural effusion. Heart size is normal. Aorta is ectatic and unfolded.  No acute osseous finding. IMPRESSION: Curvilinear bilateral lobe atelectasis.  No focal acute finding otherwise. Original Report Authenticated By: Christiana PellantGretchen Green, M.D.   Ct Head Wo Contrast  02/19/2013  *RADIOLOGY REPORT* Clinical Data: Hypertension.  Altered speech.  History of prior infarcts. CT HEAD WITHOUT CONTRAST Technique:  Contiguous axial images were obtained from the base of the skull through the vertex without contrast. Comparison: 10/21/2008 MRI. Findings: Bone windows demonstrate hypoplastic frontal sinuses. Clear mastoid air cells. Soft tissue windows demonstrate age advanced cerebral atrophy. Cava septum pellucidum.  Moderate and age advanced low density in the periventricular white matter likely related to small vessel disease.Remote lacunar infarcts in the caudate heads bilaterally. No  mass lesion, hemorrhage, hydrocephalus, acute infarct, intra- axial, or extra-axial fluid collection. IMPRESSION: 1.  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  Remote caudate head lacunar infarcts. 2. No acute intracranial abnormality. Original Report Authenticated By: Jeronimo GreavesKyle Talbot, M.D.    Assessment & Plan:   Warren BerkshireJohnny was seen today for hypertension.  Diagnoses and all orders for this visit:  Tobacco dependence  Essential hypertension   Continue medications as is for blood pressure.   Smoking cessation discussed with the patient. Warren Sullivan will try a starter pack of Chantix and taper off of cigarettes.  Follow-up: Return in about 4 weeks (around 07/19/2015) for hypertension.  Mechele ClaudeWarren Ensley Sullivan,  M.D.

## 2015-10-19 ENCOUNTER — Encounter: Payer: Self-pay | Admitting: Family Medicine

## 2015-10-19 ENCOUNTER — Encounter (INDEPENDENT_AMBULATORY_CARE_PROVIDER_SITE_OTHER): Payer: Self-pay

## 2015-10-19 ENCOUNTER — Ambulatory Visit (INDEPENDENT_AMBULATORY_CARE_PROVIDER_SITE_OTHER): Payer: Medicare Other | Admitting: Family Medicine

## 2015-10-19 VITALS — BP 129/78 | HR 72 | Temp 97.0°F | Ht 72.0 in | Wt 209.6 lb

## 2015-10-19 DIAGNOSIS — I69898 Other sequelae of other cerebrovascular disease: Secondary | ICD-10-CM | POA: Diagnosis not present

## 2015-10-19 DIAGNOSIS — I69398 Other sequelae of cerebral infarction: Secondary | ICD-10-CM

## 2015-10-19 DIAGNOSIS — I10 Essential (primary) hypertension: Secondary | ICD-10-CM | POA: Diagnosis not present

## 2015-10-19 DIAGNOSIS — Z720 Tobacco use: Secondary | ICD-10-CM | POA: Diagnosis not present

## 2015-10-19 DIAGNOSIS — R209 Unspecified disturbances of skin sensation: Principal | ICD-10-CM | POA: Insufficient documentation

## 2015-10-19 DIAGNOSIS — F17200 Nicotine dependence, unspecified, uncomplicated: Secondary | ICD-10-CM

## 2015-10-19 MED ORDER — GABAPENTIN 300 MG PO CAPS: 300.0000 mg | ORAL_CAPSULE | Freq: Three times a day (TID) | ORAL | Status: DC

## 2015-10-19 NOTE — Progress Notes (Signed)
Subjective:  Patient ID: Warren Sullivan, male    DOB: 1961-03-27  Age: 55 y.o. MRN: [id number]  CC: Hypertension   HPI Warren Sullivan presents for  follow-up of hypertension. Patient has no history of headache chest pain or shortness of breath or recent cough. Patient also denies symptoms of TIA such as numbness weakness lateralizing. Patient Does not check  blood pressure at home. Patient denies side effects from medication. States taking it regularly. However, he is still smoking and says he just doesn't want to take the pills that it would require from the Chantix to help him quit. He says he's been smoking ever since he's 55 years old and it's very hard to quit. He also says that he needs some relief from left leg pain ever since the stroke. He is willing to take more pills for that. Pain is moderately severe and it seems to be affecting his strength and balance. Limited to the left lower extremity at the thigh and the calf. Some of the foot as well. Patient unable to describe the pain sensation.   History Warren Sullivan has a past medical history of Hypertension and Stroke (HCC).   He has past surgical history that includes Hernia repair.   His family history includes Hypertension in his father, mother, and sister; Stroke in his mother.He reports that he has been smoking.  He does not have any smokeless tobacco history on file. He reports that he does not drink alcohol or use illicit drugs.  Current Outpatient Prescriptions on File Prior to Visit  Medication Sig Dispense Refill  . cloNIDine (CATAPRES) 0.2 MG tablet Take 1 tablet (0.2 mg total) by mouth 3 (three) times daily. 30 tablet 0  . hydrochlorothiazide (HYDRODIURIL) 25 MG tablet Take 1 tablet (25 mg total) by mouth daily. 90 tablet 3  . irbesartan (AVAPRO) 300 MG tablet Take 1 tablet (300 mg total) by mouth daily. For Blood pressure 90 tablet 3   No current facility-administered medications on file prior to visit.    ROS Review  of Systems  Constitutional: Negative for fever, chills and diaphoresis.  HENT: Negative for rhinorrhea and sore throat.   Respiratory: Negative for cough and shortness of breath.   Cardiovascular: Negative for chest pain.  Gastrointestinal: Negative for abdominal pain.  Musculoskeletal: Negative for myalgias and arthralgias.  Skin: Negative for rash.  Neurological: Positive for weakness (LLE). Negative for headaches.    Objective:  BP 129/78 mmHg  Pulse 72  Temp(Src) 97 F (36.1 C) (Oral)  Ht 6' (1.829 m)  Wt 209 lb 9.6 oz (95.074 kg)  BMI 28.42 kg/m2  SpO2 97%  BP Readings from Last 3 Encounters:  10/19/15 129/78  06/21/15 142/83  06/14/15 224/114    Wt Readings from Last 3 Encounters:  10/19/15 209 lb 9.6 oz (95.074 kg)  06/21/15 201 lb (91.173 kg)  06/14/15 202 lb (91.627 kg)     Physical Exam  Constitutional: He is oriented to person, place, and time. He appears well-developed and well-nourished. No distress.  HENT:  Head: Normocephalic and atraumatic.  Right Ear: External ear normal.  Left Ear: External ear normal.  Nose: Nose normal.  Mouth/Throat: Oropharynx is clear and moist.  Eyes: Conjunctivae and EOM are normal. Pupils are equal, round, and reactive to light.  Neck: Normal range of motion. Neck supple. No thyromegaly present.  Cardiovascular: Normal rate, regular rhythm and normal heart sounds.   No murmur heard. Pulmonary/Chest: Effort normal and breath sounds normal. No respiratory  distress. He has no wheezes. He has no rales.  Abdominal: Soft. Bowel sounds are normal. He exhibits no distension. There is no tenderness.  Musculoskeletal: He exhibits no edema or tenderness.  Lymphadenopathy:    He has no cervical adenopathy.  Neurological: He is alert and oriented to person, place, and time. He has normal reflexes.  Skin: Skin is warm and dry.  Psychiatric: He has a normal mood and affect. His behavior is normal. Judgment and thought content normal.      Lab Results  Component Value Date   WBC 6.4 01/20/2015   HGB 14.3 01/20/2015   HCT 43.5 01/20/2015   PLT 129* 02/19/2013   GLUCOSE 102* 03/09/2015   CHOL 214* 01/20/2015   TRIG 164* 01/20/2015   HDL 42 01/20/2015   LDLCALC 139* 01/20/2015   ALT 8 01/20/2015   AST 10 01/20/2015   NA 138 03/09/2015   K 4.1 03/09/2015   CL 95* 03/09/2015   CREATININE 0.86 03/09/2015   BUN 23 03/09/2015   CO2 26 03/09/2015   TSH 2.170 01/20/2015   INR 1.1 10/21/2008   HGBA1C  10/23/2008    5.7 (NOTE) The ADA recommends the following therapeutic goal for glycemic control related to Hgb A1c measurement: Goal of therapy: <6.5 Hgb A1c  Reference: American Diabetes Association: Clinical Practice Recommendations 2010, Diabetes Care, 2010, 33: (Suppl  1).    Dg Chest 2 View  02/19/2013  *RADIOLOGY REPORT* Clinical Data: Aphasia, hypertension CHEST - 2 VIEW Comparison: None. Findings: Lung volumes are low with crowding of the bronchovascular markings.  No focal/lobar consolidation.  No pleural effusion. Heart size is normal. Aorta is ectatic and unfolded.  No acute osseous finding. IMPRESSION: Curvilinear bilateral lobe atelectasis.  No focal acute finding otherwise. Original Report Authenticated By: Christiana PellantGretchen Green, M.D.   Ct Head Wo Contrast  02/19/2013  *RADIOLOGY REPORT* Clinical Data: Hypertension.  Altered speech.  History of prior infarcts. CT HEAD WITHOUT CONTRAST Technique:  Contiguous axial images were obtained from the base of the skull through the vertex without contrast. Comparison: 10/21/2008 MRI. Findings: Bone windows demonstrate hypoplastic frontal sinuses. Clear mastoid air cells. Soft tissue windows demonstrate age advanced cerebral atrophy. Cava septum pellucidum.  Moderate and age advanced low density in the periventricular white matter likely related to small vessel disease.Remote lacunar infarcts in the caudate heads bilaterally. No  mass lesion, hemorrhage, hydrocephalus, acute  infarct, intra- axial, or extra-axial fluid collection. IMPRESSION: 1.  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  Remote caudate head lacunar infarcts. 2. No acute intracranial abnormality. Original Report Authenticated By: Jeronimo GreavesKyle Talbot, M.D.    Assessment & Plan:   [name] was seen today for hypertension.  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 Start with one at bedtime for 1 week. Then two at bedtime for one week, then go to full dose.   I have discontinued Mr. Norris CrossWilliams's varenicline. I am also having him start on gabapentin. Additionally, I am having him maintain his hydrochlorothiazide, irbesartan, and cloNIDine.  Meds ordered this encounter  Medications  . gabapentin (NEURONTIN) 300 MG capsule    Sig: Take 1 capsule (300 mg total) by mouth 3 (three) times daily. Start with one at bedtime for 1 week. Then two at bedtime for  one week, then go to full dose.    Dispense:  90 capsule    Refill:  5     Follow-up: Return in about 6 months (around 04/19/2016).  Mechele Claude, M.D.

## 2015-10-19 NOTE — Addendum Note (Signed)
Addended by: Bearl MulberryUTHERFORD, Rayen Palen K on: 10/19/2015 04:08 PM   Modules accepted: Orders, SmartSet

## 2015-10-19 NOTE — Addendum Note (Signed)
Addended by: Bearl MulberryUTHERFORD, Franko Hilliker K on: 10/19/2015 11:28 AM   Modules accepted: Orders, SmartSet

## 2015-10-20 LAB — CBC WITH DIFFERENTIAL/PLATELET
BASOS ABS: 0 10*3/uL (ref 0.0–0.2)
Basos: 1 %
EOS (ABSOLUTE): 0.1 10*3/uL (ref 0.0–0.4)
Eos: 2 %
Hematocrit: 41 % (ref 37.5–51.0)
Hemoglobin: 13.8 g/dL (ref 12.6–17.7)
Immature Grans (Abs): 0 10*3/uL (ref 0.0–0.1)
Immature Granulocytes: 0 %
LYMPHS ABS: 1.6 10*3/uL (ref 0.7–3.1)
Lymphs: 29 %
MCH: 30.9 pg (ref 26.6–33.0)
MCHC: 33.7 g/dL (ref 31.5–35.7)
MCV: 92 fL (ref 79–97)
MONOCYTES: 6 %
MONOS ABS: 0.3 10*3/uL (ref 0.1–0.9)
Neutrophils Absolute: 3.5 10*3/uL (ref 1.4–7.0)
Neutrophils: 62 %
Platelets: 125 10*3/uL — ABNORMAL LOW (ref 150–379)
RBC: 4.47 x10E6/uL (ref 4.14–5.80)
RDW: 14.5 % (ref 12.3–15.4)
WBC: 5.6 10*3/uL (ref 3.4–10.8)

## 2015-10-20 LAB — CMP14+EGFR
ALK PHOS: 52 IU/L (ref 39–117)
ALT: 9 IU/L (ref 0–44)
AST: 15 IU/L (ref 0–40)
Albumin/Globulin Ratio: 1.2 (ref 1.2–2.2)
Albumin: 4 g/dL (ref 3.5–5.5)
BUN/Creatinine Ratio: 20 (ref 9–20)
BUN: 19 mg/dL (ref 6–24)
Bilirubin Total: 0.3 mg/dL (ref 0.0–1.2)
CHLORIDE: 98 mmol/L (ref 96–106)
CO2: 26 mmol/L (ref 18–29)
CREATININE: 0.94 mg/dL (ref 0.76–1.27)
Calcium: 9.1 mg/dL (ref 8.7–10.2)
GFR calc Af Amer: 104 mL/min/{1.73_m2} (ref >59)
GFR calc non Af Amer: 90 mL/min/{1.73_m2} (ref >59)
GLOBULIN, TOTAL: 3.3 g/dL (ref 1.5–4.5)
GLUCOSE: 116 mg/dL — AB (ref 65–99)
Potassium: 4.3 mmol/L (ref 3.5–5.2)
Sodium: 137 mmol/L (ref 134–144)
Total Protein: 7.3 g/dL (ref 6.0–8.5)

## 2015-11-16 ENCOUNTER — Encounter: Payer: Self-pay | Admitting: Family Medicine

## 2015-11-16 ENCOUNTER — Ambulatory Visit (INDEPENDENT_AMBULATORY_CARE_PROVIDER_SITE_OTHER): Payer: Medicare Other | Admitting: Family Medicine

## 2015-11-16 VITALS — BP 179/98 | HR 71 | Temp 97.4°F | Ht 72.0 in | Wt 207.8 lb

## 2015-11-16 DIAGNOSIS — F17200 Nicotine dependence, unspecified, uncomplicated: Secondary | ICD-10-CM

## 2015-11-16 DIAGNOSIS — I10 Essential (primary) hypertension: Secondary | ICD-10-CM

## 2015-11-16 DIAGNOSIS — I69898 Other sequelae of other cerebrovascular disease: Secondary | ICD-10-CM

## 2015-11-16 DIAGNOSIS — I69398 Other sequelae of cerebral infarction: Secondary | ICD-10-CM

## 2015-11-16 DIAGNOSIS — R209 Unspecified disturbances of skin sensation: Principal | ICD-10-CM

## 2015-11-16 NOTE — Progress Notes (Signed)
Subjective:  Patient ID: Warren Sullivan, male    DOB: [birth year]  Age: 55 y.o. MRN: [id number]  CC: nursing home placement   HPI Warren Sullivan presents for evicted from apartment due to smoking. Not doing well post CVA. Weak on left.    History Warren Sullivan has a past medical history of Hypertension and Stroke (HCC).   Warren Sullivan has past surgical history that includes Hernia repair.   His family history includes Hypertension in his father, mother, and sister; Stroke in his mother.Warren Sullivan reports that Warren Sullivan has been smoking.  Warren Sullivan does not have any smokeless tobacco history on file. Warren Sullivan reports that Warren Sullivan does not drink alcohol or use illicit drugs.    ROS Review of Systems  Constitutional: Negative for fever, chills, diaphoresis and unexpected weight change.  HENT: Negative for congestion, hearing loss, rhinorrhea and sore throat.   Eyes: Negative for visual disturbance.  Respiratory: Negative for cough and shortness of breath.   Cardiovascular: Negative for chest pain.  Gastrointestinal: Negative for abdominal pain, diarrhea and constipation.  Genitourinary: Negative for dysuria and flank pain.  Musculoskeletal: Positive for arthralgias. Negative for joint swelling.  Skin: Negative for rash.  Neurological: Positive for weakness and numbness (left sided). Negative for dizziness and headaches.  Psychiatric/Behavioral: Positive for behavioral problems, confusion and decreased concentration. Negative for hallucinations, sleep disturbance and dysphoric mood. The patient is nervous/anxious.     Objective:  BP 179/98 mmHg  Pulse 71  Temp(Src) 97.4 F (36.3 C) (Oral)  Ht 6' (1.829 m)  Wt 207 lb 12.8 oz (94.257 kg)  BMI 28.18 kg/m2  SpO2 98%  BP Readings from Last 3 Encounters:  11/16/15 179/98  10/19/15 129/78  06/21/15 142/83    Wt Readings from Last 3 Encounters:  11/16/15 207 lb 12.8 oz (94.257 kg)  10/19/15 209 lb 9.6 oz (95.074 kg)  06/21/15 201 lb (91.173 kg)     Physical Exam    Constitutional: Warren Sullivan is oriented to person, place, and time. Warren Sullivan appears well-developed. Warren Sullivan appears distressed.  HENT:  Head: Normocephalic and atraumatic.  Right Ear: External ear normal.  Left Ear: External ear normal.  Nose: Nose normal.  Mouth/Throat: Oropharynx is clear and moist.  Eyes: Conjunctivae and EOM are normal. Pupils are equal, round, and reactive to light.  Neck: Normal range of motion. Neck supple. No thyromegaly present.  Cardiovascular: Normal rate, regular rhythm and normal heart sounds.   No murmur heard. Pulmonary/Chest: Effort normal and breath sounds normal. No respiratory distress. Warren Sullivan has no wheezes. Warren Sullivan has no rales.  Abdominal: Soft. Bowel sounds are normal. Warren Sullivan exhibits no distension. There is no tenderness.  Musculoskeletal:  Left weak compared to right.   Lymphadenopathy:    Warren Sullivan has no cervical adenopathy.  Neurological: Warren Sullivan is alert and oriented to person, place, and time. Warren Sullivan has normal reflexes.  Skin: Skin is warm and dry.  Psychiatric: His mood appears anxious. His affect is blunt. His speech is delayed and tangential. Warren Sullivan is slowed. Thought content is delusional. Cognition and memory are impaired. Warren Sullivan expresses impulsivity and inappropriate judgment. Warren Sullivan exhibits abnormal recent memory.     Lab Results  Component Value Date   WBC 5.6 10/19/2015   HGB 14.3 01/20/2015   HCT 41.0 10/19/2015   PLT 125* 10/19/2015   GLUCOSE 116* 10/19/2015   CHOL 214* 01/20/2015   TRIG 164* 01/20/2015   HDL 42 01/20/2015   LDLCALC 139* 01/20/2015   ALT 9 10/19/2015   AST 15 10/19/2015  NA 137 10/19/2015   K 4.3 10/19/2015   CL 98 10/19/2015   CREATININE 0.94 10/19/2015   BUN 19 10/19/2015   CO2 26 10/19/2015   TSH 2.170 01/20/2015   INR 1.1 10/21/2008   HGBA1C  10/23/2008    5.7 (NOTE) The ADA recommends the following therapeutic goal for glycemic control related to Hgb A1c measurement: Goal of therapy: <6.5 Hgb A1c  Reference: American Diabetes Association:  Clinical Practice Recommendations 2010, Diabetes Care, 2010, 33: (Suppl  1).    Dg Chest 2 View  02/19/2013  *RADIOLOGY REPORT* Clinical Data: Aphasia, hypertension CHEST - 2 VIEW Comparison: None. Findings: Lung volumes are low with crowding of the bronchovascular markings.  No focal/lobar consolidation.  No pleural effusion. Heart size is normal. Aorta is ectatic and unfolded.  No acute osseous finding. IMPRESSION: Curvilinear bilateral lobe atelectasis.  No focal acute finding otherwise. Original Report Authenticated By: Christiana PellantGretchen Green, M.D.   Ct Head Wo Contrast  02/19/2013  *RADIOLOGY REPORT* Clinical Data: Hypertension.  Altered speech.  History of prior infarcts. CT HEAD WITHOUT CONTRAST Technique:  Contiguous axial images were obtained from the base of the skull through the vertex without contrast. Comparison: 10/21/2008 MRI. Findings: Bone windows demonstrate hypoplastic frontal sinuses. Clear mastoid air cells. Soft tissue windows demonstrate age advanced cerebral atrophy. Cava septum pellucidum.  Moderate and age advanced low density in the periventricular white matter likely related to small vessel disease.Remote lacunar infarcts in the caudate heads bilaterally. No  mass lesion, hemorrhage, hydrocephalus, acute infarct, intra- axial, or extra-axial fluid collection. IMPRESSION: 1.  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  Remote caudate head lacunar infarcts. 2. No acute intracranial abnormality. Original Report Authenticated By: Jeronimo GreavesKyle Talbot, M.D.    Assessment & Plan:   Warren BerkshireJohnny was seen today for nursing home placement.  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 [facility] Social services[facility]  I  am having Warren Sullivan maintain his hydrochlorothiazide, irbesartan, cloNIDine, and gabapentin.  No orders of the defined types were placed in this encounter.     Follow-up: Return in about 1 month (around 12/17/2015).  Mechele ClaudeWarren Armenta Erskin, M.D.

## 2015-11-18 DIAGNOSIS — E559 Vitamin D deficiency, unspecified: Secondary | ICD-10-CM | POA: Diagnosis not present

## 2015-11-18 DIAGNOSIS — R278 Other lack of coordination: Secondary | ICD-10-CM | POA: Diagnosis not present

## 2015-11-18 DIAGNOSIS — G319 Degenerative disease of nervous system, unspecified: Secondary | ICD-10-CM | POA: Diagnosis not present

## 2015-11-18 DIAGNOSIS — G63 Polyneuropathy in diseases classified elsewhere: Secondary | ICD-10-CM | POA: Diagnosis not present

## 2015-11-18 DIAGNOSIS — R2681 Unsteadiness on feet: Secondary | ICD-10-CM | POA: Diagnosis not present

## 2015-11-18 DIAGNOSIS — I4891 Unspecified atrial fibrillation: Secondary | ICD-10-CM | POA: Diagnosis not present

## 2015-11-18 DIAGNOSIS — Z8673 Personal history of transient ischemic attack (TIA), and cerebral infarction without residual deficits: Secondary | ICD-10-CM | POA: Diagnosis not present

## 2015-11-18 DIAGNOSIS — G8191 Hemiplegia, unspecified affecting right dominant side: Secondary | ICD-10-CM | POA: Diagnosis not present

## 2015-11-18 DIAGNOSIS — M6281 Muscle weakness (generalized): Secondary | ICD-10-CM | POA: Diagnosis not present

## 2015-11-18 DIAGNOSIS — I10 Essential (primary) hypertension: Secondary | ICD-10-CM | POA: Diagnosis not present

## 2015-11-21 DIAGNOSIS — Z8673 Personal history of transient ischemic attack (TIA), and cerebral infarction without residual deficits: Secondary | ICD-10-CM | POA: Diagnosis not present

## 2015-11-21 DIAGNOSIS — G63 Polyneuropathy in diseases classified elsewhere: Secondary | ICD-10-CM | POA: Diagnosis not present

## 2015-11-21 DIAGNOSIS — M6281 Muscle weakness (generalized): Secondary | ICD-10-CM | POA: Diagnosis not present

## 2015-11-21 DIAGNOSIS — G8191 Hemiplegia, unspecified affecting right dominant side: Secondary | ICD-10-CM | POA: Diagnosis not present

## 2016-02-27 DIAGNOSIS — F17210 Nicotine dependence, cigarettes, uncomplicated: Secondary | ICD-10-CM | POA: Diagnosis not present

## 2016-02-27 DIAGNOSIS — G8191 Hemiplegia, unspecified affecting right dominant side: Secondary | ICD-10-CM | POA: Diagnosis not present

## 2016-02-27 DIAGNOSIS — Z8673 Personal history of transient ischemic attack (TIA), and cerebral infarction without residual deficits: Secondary | ICD-10-CM | POA: Diagnosis not present

## 2016-02-27 DIAGNOSIS — I10 Essential (primary) hypertension: Secondary | ICD-10-CM | POA: Diagnosis not present

## 2016-03-14 DIAGNOSIS — E559 Vitamin D deficiency, unspecified: Secondary | ICD-10-CM | POA: Diagnosis not present

## 2016-03-14 DIAGNOSIS — Z8673 Personal history of transient ischemic attack (TIA), and cerebral infarction without residual deficits: Secondary | ICD-10-CM | POA: Diagnosis not present

## 2016-03-14 DIAGNOSIS — R05 Cough: Secondary | ICD-10-CM | POA: Diagnosis not present

## 2016-05-24 DIAGNOSIS — I10 Essential (primary) hypertension: Secondary | ICD-10-CM | POA: Diagnosis not present

## 2016-05-24 DIAGNOSIS — G63 Polyneuropathy in diseases classified elsewhere: Secondary | ICD-10-CM | POA: Diagnosis not present

## 2016-05-28 DIAGNOSIS — Z8673 Personal history of transient ischemic attack (TIA), and cerebral infarction without residual deficits: Secondary | ICD-10-CM | POA: Diagnosis not present

## 2016-05-28 DIAGNOSIS — G63 Polyneuropathy in diseases classified elsewhere: Secondary | ICD-10-CM | POA: Diagnosis not present

## 2016-07-30 DIAGNOSIS — I639 Cerebral infarction, unspecified: Secondary | ICD-10-CM | POA: Diagnosis not present

## 2016-07-30 DIAGNOSIS — G319 Degenerative disease of nervous system, unspecified: Secondary | ICD-10-CM | POA: Diagnosis not present

## 2016-07-30 DIAGNOSIS — M6281 Muscle weakness (generalized): Secondary | ICD-10-CM | POA: Diagnosis not present

## 2016-09-17 DIAGNOSIS — G319 Degenerative disease of nervous system, unspecified: Secondary | ICD-10-CM | POA: Diagnosis not present

## 2016-09-17 DIAGNOSIS — I10 Essential (primary) hypertension: Secondary | ICD-10-CM | POA: Diagnosis not present

## 2016-11-15 DIAGNOSIS — M6281 Muscle weakness (generalized): Secondary | ICD-10-CM | POA: Diagnosis not present

## 2016-11-15 DIAGNOSIS — G8191 Hemiplegia, unspecified affecting right dominant side: Secondary | ICD-10-CM | POA: Diagnosis not present

## 2016-11-15 DIAGNOSIS — I629 Nontraumatic intracranial hemorrhage, unspecified: Secondary | ICD-10-CM | POA: Diagnosis not present

## 2016-12-17 DIAGNOSIS — G8191 Hemiplegia, unspecified affecting right dominant side: Secondary | ICD-10-CM | POA: Diagnosis not present

## 2016-12-17 DIAGNOSIS — I639 Cerebral infarction, unspecified: Secondary | ICD-10-CM | POA: Diagnosis not present

## 2017-01-14 DIAGNOSIS — I10 Essential (primary) hypertension: Secondary | ICD-10-CM | POA: Diagnosis not present

## 2017-01-14 DIAGNOSIS — I639 Cerebral infarction, unspecified: Secondary | ICD-10-CM | POA: Diagnosis not present

## 2017-01-24 DIAGNOSIS — I10 Essential (primary) hypertension: Secondary | ICD-10-CM | POA: Diagnosis not present

## 2017-01-24 DIAGNOSIS — R2681 Unsteadiness on feet: Secondary | ICD-10-CM | POA: Diagnosis not present

## 2017-01-24 DIAGNOSIS — G8191 Hemiplegia, unspecified affecting right dominant side: Secondary | ICD-10-CM | POA: Diagnosis not present

## 2017-01-25 DIAGNOSIS — G8191 Hemiplegia, unspecified affecting right dominant side: Secondary | ICD-10-CM | POA: Diagnosis not present

## 2017-01-25 DIAGNOSIS — R2681 Unsteadiness on feet: Secondary | ICD-10-CM | POA: Diagnosis not present

## 2017-01-25 DIAGNOSIS — I10 Essential (primary) hypertension: Secondary | ICD-10-CM | POA: Diagnosis not present

## 2017-01-28 DIAGNOSIS — G8191 Hemiplegia, unspecified affecting right dominant side: Secondary | ICD-10-CM | POA: Diagnosis not present

## 2017-01-28 DIAGNOSIS — R2681 Unsteadiness on feet: Secondary | ICD-10-CM | POA: Diagnosis not present

## 2017-01-28 DIAGNOSIS — I10 Essential (primary) hypertension: Secondary | ICD-10-CM | POA: Diagnosis not present

## 2017-01-29 DIAGNOSIS — G8191 Hemiplegia, unspecified affecting right dominant side: Secondary | ICD-10-CM | POA: Diagnosis not present

## 2017-01-29 DIAGNOSIS — I10 Essential (primary) hypertension: Secondary | ICD-10-CM | POA: Diagnosis not present

## 2017-01-29 DIAGNOSIS — R2681 Unsteadiness on feet: Secondary | ICD-10-CM | POA: Diagnosis not present

## 2017-01-30 DIAGNOSIS — I10 Essential (primary) hypertension: Secondary | ICD-10-CM | POA: Diagnosis not present

## 2017-01-30 DIAGNOSIS — G8191 Hemiplegia, unspecified affecting right dominant side: Secondary | ICD-10-CM | POA: Diagnosis not present

## 2017-01-30 DIAGNOSIS — R2681 Unsteadiness on feet: Secondary | ICD-10-CM | POA: Diagnosis not present

## 2017-01-31 DIAGNOSIS — R2681 Unsteadiness on feet: Secondary | ICD-10-CM | POA: Diagnosis not present

## 2017-01-31 DIAGNOSIS — I10 Essential (primary) hypertension: Secondary | ICD-10-CM | POA: Diagnosis not present

## 2017-01-31 DIAGNOSIS — G8191 Hemiplegia, unspecified affecting right dominant side: Secondary | ICD-10-CM | POA: Diagnosis not present

## 2017-02-01 DIAGNOSIS — R2681 Unsteadiness on feet: Secondary | ICD-10-CM | POA: Diagnosis not present

## 2017-02-01 DIAGNOSIS — G8191 Hemiplegia, unspecified affecting right dominant side: Secondary | ICD-10-CM | POA: Diagnosis not present

## 2017-02-01 DIAGNOSIS — I10 Essential (primary) hypertension: Secondary | ICD-10-CM | POA: Diagnosis not present

## 2017-02-03 DIAGNOSIS — R2681 Unsteadiness on feet: Secondary | ICD-10-CM | POA: Diagnosis not present

## 2017-02-03 DIAGNOSIS — I10 Essential (primary) hypertension: Secondary | ICD-10-CM | POA: Diagnosis not present

## 2017-02-03 DIAGNOSIS — G8191 Hemiplegia, unspecified affecting right dominant side: Secondary | ICD-10-CM | POA: Diagnosis not present

## 2017-02-05 DIAGNOSIS — R2681 Unsteadiness on feet: Secondary | ICD-10-CM | POA: Diagnosis not present

## 2017-02-05 DIAGNOSIS — I10 Essential (primary) hypertension: Secondary | ICD-10-CM | POA: Diagnosis not present

## 2017-02-05 DIAGNOSIS — G8191 Hemiplegia, unspecified affecting right dominant side: Secondary | ICD-10-CM | POA: Diagnosis not present

## 2017-02-06 DIAGNOSIS — G8191 Hemiplegia, unspecified affecting right dominant side: Secondary | ICD-10-CM | POA: Diagnosis not present

## 2017-02-06 DIAGNOSIS — R2681 Unsteadiness on feet: Secondary | ICD-10-CM | POA: Diagnosis not present

## 2017-02-06 DIAGNOSIS — I10 Essential (primary) hypertension: Secondary | ICD-10-CM | POA: Diagnosis not present

## 2017-02-07 DIAGNOSIS — I10 Essential (primary) hypertension: Secondary | ICD-10-CM | POA: Diagnosis not present

## 2017-02-07 DIAGNOSIS — G8191 Hemiplegia, unspecified affecting right dominant side: Secondary | ICD-10-CM | POA: Diagnosis not present

## 2017-02-07 DIAGNOSIS — R2681 Unsteadiness on feet: Secondary | ICD-10-CM | POA: Diagnosis not present

## 2017-02-08 DIAGNOSIS — I10 Essential (primary) hypertension: Secondary | ICD-10-CM | POA: Diagnosis not present

## 2017-02-08 DIAGNOSIS — R2681 Unsteadiness on feet: Secondary | ICD-10-CM | POA: Diagnosis not present

## 2017-02-08 DIAGNOSIS — G8191 Hemiplegia, unspecified affecting right dominant side: Secondary | ICD-10-CM | POA: Diagnosis not present

## 2017-02-11 DIAGNOSIS — G8191 Hemiplegia, unspecified affecting right dominant side: Secondary | ICD-10-CM | POA: Diagnosis not present

## 2017-02-11 DIAGNOSIS — R2681 Unsteadiness on feet: Secondary | ICD-10-CM | POA: Diagnosis not present

## 2017-02-11 DIAGNOSIS — I10 Essential (primary) hypertension: Secondary | ICD-10-CM | POA: Diagnosis not present

## 2017-02-12 DIAGNOSIS — G8191 Hemiplegia, unspecified affecting right dominant side: Secondary | ICD-10-CM | POA: Diagnosis not present

## 2017-02-12 DIAGNOSIS — R2681 Unsteadiness on feet: Secondary | ICD-10-CM | POA: Diagnosis not present

## 2017-02-12 DIAGNOSIS — I10 Essential (primary) hypertension: Secondary | ICD-10-CM | POA: Diagnosis not present

## 2017-02-13 DIAGNOSIS — G8191 Hemiplegia, unspecified affecting right dominant side: Secondary | ICD-10-CM | POA: Diagnosis not present

## 2017-02-13 DIAGNOSIS — R2681 Unsteadiness on feet: Secondary | ICD-10-CM | POA: Diagnosis not present

## 2017-02-13 DIAGNOSIS — I10 Essential (primary) hypertension: Secondary | ICD-10-CM | POA: Diagnosis not present

## 2017-02-14 DIAGNOSIS — G8191 Hemiplegia, unspecified affecting right dominant side: Secondary | ICD-10-CM | POA: Diagnosis not present

## 2017-02-14 DIAGNOSIS — R2681 Unsteadiness on feet: Secondary | ICD-10-CM | POA: Diagnosis not present

## 2017-02-14 DIAGNOSIS — I10 Essential (primary) hypertension: Secondary | ICD-10-CM | POA: Diagnosis not present

## 2017-02-15 DIAGNOSIS — G8191 Hemiplegia, unspecified affecting right dominant side: Secondary | ICD-10-CM | POA: Diagnosis not present

## 2017-02-15 DIAGNOSIS — I10 Essential (primary) hypertension: Secondary | ICD-10-CM | POA: Diagnosis not present

## 2017-02-15 DIAGNOSIS — R2681 Unsteadiness on feet: Secondary | ICD-10-CM | POA: Diagnosis not present

## 2017-02-18 DIAGNOSIS — I10 Essential (primary) hypertension: Secondary | ICD-10-CM | POA: Diagnosis not present

## 2017-02-18 DIAGNOSIS — R2681 Unsteadiness on feet: Secondary | ICD-10-CM | POA: Diagnosis not present

## 2017-02-18 DIAGNOSIS — G8191 Hemiplegia, unspecified affecting right dominant side: Secondary | ICD-10-CM | POA: Diagnosis not present

## 2017-02-25 DIAGNOSIS — G8191 Hemiplegia, unspecified affecting right dominant side: Secondary | ICD-10-CM | POA: Diagnosis not present

## 2017-02-25 DIAGNOSIS — R2681 Unsteadiness on feet: Secondary | ICD-10-CM | POA: Diagnosis not present

## 2017-02-25 DIAGNOSIS — I10 Essential (primary) hypertension: Secondary | ICD-10-CM | POA: Diagnosis not present

## 2017-02-26 DIAGNOSIS — I10 Essential (primary) hypertension: Secondary | ICD-10-CM | POA: Diagnosis not present

## 2017-02-26 DIAGNOSIS — R2681 Unsteadiness on feet: Secondary | ICD-10-CM | POA: Diagnosis not present

## 2017-02-26 DIAGNOSIS — G8191 Hemiplegia, unspecified affecting right dominant side: Secondary | ICD-10-CM | POA: Diagnosis not present

## 2017-02-27 DIAGNOSIS — R2681 Unsteadiness on feet: Secondary | ICD-10-CM | POA: Diagnosis not present

## 2017-02-27 DIAGNOSIS — I10 Essential (primary) hypertension: Secondary | ICD-10-CM | POA: Diagnosis not present

## 2017-02-27 DIAGNOSIS — G8191 Hemiplegia, unspecified affecting right dominant side: Secondary | ICD-10-CM | POA: Diagnosis not present

## 2017-02-28 DIAGNOSIS — R2681 Unsteadiness on feet: Secondary | ICD-10-CM | POA: Diagnosis not present

## 2017-02-28 DIAGNOSIS — I639 Cerebral infarction, unspecified: Secondary | ICD-10-CM | POA: Diagnosis not present

## 2017-02-28 DIAGNOSIS — G8191 Hemiplegia, unspecified affecting right dominant side: Secondary | ICD-10-CM | POA: Diagnosis not present

## 2017-02-28 DIAGNOSIS — B850 Pediculosis due to Pediculus humanus capitis: Secondary | ICD-10-CM | POA: Diagnosis not present

## 2017-02-28 DIAGNOSIS — I10 Essential (primary) hypertension: Secondary | ICD-10-CM | POA: Diagnosis not present

## 2017-03-01 DIAGNOSIS — I10 Essential (primary) hypertension: Secondary | ICD-10-CM | POA: Diagnosis not present

## 2017-03-01 DIAGNOSIS — R2681 Unsteadiness on feet: Secondary | ICD-10-CM | POA: Diagnosis not present

## 2017-03-01 DIAGNOSIS — G8191 Hemiplegia, unspecified affecting right dominant side: Secondary | ICD-10-CM | POA: Diagnosis not present

## 2017-08-13 DIAGNOSIS — E785 Hyperlipidemia, unspecified: Secondary | ICD-10-CM | POA: Diagnosis not present

## 2017-08-13 DIAGNOSIS — I10 Essential (primary) hypertension: Secondary | ICD-10-CM | POA: Diagnosis not present

## 2017-08-13 DIAGNOSIS — D649 Anemia, unspecified: Secondary | ICD-10-CM | POA: Diagnosis not present

## 2017-08-13 DIAGNOSIS — E559 Vitamin D deficiency, unspecified: Secondary | ICD-10-CM | POA: Diagnosis not present

## 2017-08-13 DIAGNOSIS — Z5181 Encounter for therapeutic drug level monitoring: Secondary | ICD-10-CM | POA: Diagnosis not present

## 2017-08-13 DIAGNOSIS — E119 Type 2 diabetes mellitus without complications: Secondary | ICD-10-CM | POA: Diagnosis not present

## 2017-09-03 DIAGNOSIS — G629 Polyneuropathy, unspecified: Secondary | ICD-10-CM | POA: Diagnosis not present

## 2017-09-03 DIAGNOSIS — I10 Essential (primary) hypertension: Secondary | ICD-10-CM | POA: Diagnosis not present

## 2017-09-03 DIAGNOSIS — I679 Cerebrovascular disease, unspecified: Secondary | ICD-10-CM | POA: Diagnosis not present

## 2017-09-30 DIAGNOSIS — K219 Gastro-esophageal reflux disease without esophagitis: Secondary | ICD-10-CM | POA: Diagnosis not present

## 2017-09-30 DIAGNOSIS — L97129 Non-pressure chronic ulcer of left thigh with unspecified severity: Secondary | ICD-10-CM | POA: Diagnosis not present

## 2017-09-30 DIAGNOSIS — S71109A Unspecified open wound, unspecified thigh, initial encounter: Secondary | ICD-10-CM | POA: Diagnosis not present

## 2017-10-02 DIAGNOSIS — E119 Type 2 diabetes mellitus without complications: Secondary | ICD-10-CM | POA: Diagnosis not present

## 2021-08-17 ENCOUNTER — Emergency Department (HOSPITAL_COMMUNITY): Payer: Medicare Other

## 2021-08-17 ENCOUNTER — Inpatient Hospital Stay (HOSPITAL_COMMUNITY)
Admission: EM | Admit: 2021-08-17 | Discharge: 2021-08-20 | DRG: 392 | Disposition: A | Discharge status: Skilled Nursing Facility | Payer: Medicare Other | Source: Skilled Nursing Facility | Attending: Family Medicine | Admitting: Family Medicine

## 2021-08-17 ENCOUNTER — Encounter (HOSPITAL_COMMUNITY): Payer: Self-pay | Admitting: Emergency Medicine

## 2021-08-17 DIAGNOSIS — I10 Essential (primary) hypertension: Secondary | ICD-10-CM

## 2021-08-17 DIAGNOSIS — N400 Enlarged prostate without lower urinary tract symptoms: Secondary | ICD-10-CM | POA: Diagnosis present

## 2021-08-17 DIAGNOSIS — K229 Disease of esophagus, unspecified: Secondary | ICD-10-CM | POA: Diagnosis present

## 2021-08-17 DIAGNOSIS — I119 Hypertensive heart disease without heart failure: Secondary | ICD-10-CM | POA: Diagnosis present

## 2021-08-17 DIAGNOSIS — E876 Hypokalemia: Secondary | ICD-10-CM | POA: Diagnosis present

## 2021-08-17 DIAGNOSIS — F01518 Vascular dementia, unspecified severity, with other behavioral disturbance: Secondary | ICD-10-CM | POA: Diagnosis present

## 2021-08-17 DIAGNOSIS — E871 Hypo-osmolality and hyponatremia: Secondary | ICD-10-CM

## 2021-08-17 DIAGNOSIS — I69351 Hemiplegia and hemiparesis following cerebral infarction affecting right dominant side: Secondary | ICD-10-CM | POA: Diagnosis not present

## 2021-08-17 DIAGNOSIS — Z7982 Long term (current) use of aspirin: Secondary | ICD-10-CM

## 2021-08-17 DIAGNOSIS — K2960 Other gastritis without bleeding: Secondary | ICD-10-CM | POA: Diagnosis present

## 2021-08-17 DIAGNOSIS — K259 Gastric ulcer, unspecified as acute or chronic, without hemorrhage or perforation: Secondary | ICD-10-CM | POA: Diagnosis not present

## 2021-08-17 DIAGNOSIS — K219 Gastro-esophageal reflux disease without esophagitis: Secondary | ICD-10-CM | POA: Diagnosis present

## 2021-08-17 DIAGNOSIS — K56609 Unspecified intestinal obstruction, unspecified as to partial versus complete obstruction: Secondary | ICD-10-CM | POA: Diagnosis present

## 2021-08-17 DIAGNOSIS — K2100 Gastro-esophageal reflux disease with esophagitis, without bleeding: Principal | ICD-10-CM | POA: Diagnosis present

## 2021-08-17 DIAGNOSIS — R1110 Vomiting, unspecified: Secondary | ICD-10-CM

## 2021-08-17 DIAGNOSIS — K2289 Other specified disease of esophagus: Secondary | ICD-10-CM

## 2021-08-17 DIAGNOSIS — K2980 Duodenitis without bleeding: Secondary | ICD-10-CM | POA: Diagnosis present

## 2021-08-17 DIAGNOSIS — K449 Diaphragmatic hernia without obstruction or gangrene: Secondary | ICD-10-CM | POA: Diagnosis present

## 2021-08-17 DIAGNOSIS — Z20822 Contact with and (suspected) exposure to covid-19: Secondary | ICD-10-CM | POA: Diagnosis present

## 2021-08-17 DIAGNOSIS — D62 Acute posthemorrhagic anemia: Secondary | ICD-10-CM | POA: Diagnosis present

## 2021-08-17 DIAGNOSIS — Z8249 Family history of ischemic heart disease and other diseases of the circulatory system: Secondary | ICD-10-CM

## 2021-08-17 DIAGNOSIS — E860 Dehydration: Secondary | ICD-10-CM | POA: Diagnosis present

## 2021-08-17 DIAGNOSIS — F02818 Dementia in other diseases classified elsewhere, unspecified severity, with other behavioral disturbance: Secondary | ICD-10-CM | POA: Diagnosis present

## 2021-08-17 DIAGNOSIS — Z4659 Encounter for fitting and adjustment of other gastrointestinal appliance and device: Secondary | ICD-10-CM

## 2021-08-17 DIAGNOSIS — N179 Acute kidney failure, unspecified: Secondary | ICD-10-CM

## 2021-08-17 DIAGNOSIS — Z823 Family history of stroke: Secondary | ICD-10-CM

## 2021-08-17 DIAGNOSIS — F17210 Nicotine dependence, cigarettes, uncomplicated: Secondary | ICD-10-CM | POA: Diagnosis present

## 2021-08-17 DIAGNOSIS — K3189 Other diseases of stomach and duodenum: Secondary | ICD-10-CM | POA: Diagnosis not present

## 2021-08-17 DIAGNOSIS — F0390 Unspecified dementia without behavioral disturbance: Secondary | ICD-10-CM | POA: Diagnosis not present

## 2021-08-17 DIAGNOSIS — K920 Hematemesis: Secondary | ICD-10-CM | POA: Diagnosis present

## 2021-08-17 DIAGNOSIS — E559 Vitamin D deficiency, unspecified: Secondary | ICD-10-CM | POA: Diagnosis present

## 2021-08-17 DIAGNOSIS — R739 Hyperglycemia, unspecified: Secondary | ICD-10-CM | POA: Diagnosis present

## 2021-08-17 DIAGNOSIS — R1111 Vomiting without nausea: Secondary | ICD-10-CM

## 2021-08-17 DIAGNOSIS — E785 Hyperlipidemia, unspecified: Secondary | ICD-10-CM

## 2021-08-17 DIAGNOSIS — R933 Abnormal findings on diagnostic imaging of other parts of digestive tract: Secondary | ICD-10-CM | POA: Diagnosis not present

## 2021-08-17 DIAGNOSIS — Z79899 Other long term (current) drug therapy: Secondary | ICD-10-CM

## 2021-08-17 HISTORY — DX: Hyperlipidemia, unspecified: E78.5

## 2021-08-17 HISTORY — DX: Cerebral infarction, unspecified: I63.9

## 2021-08-17 HISTORY — DX: Vitamin D deficiency, unspecified: E55.9

## 2021-08-17 HISTORY — DX: Hemiplegia, unspecified affecting right dominant side: G81.91

## 2021-08-17 HISTORY — DX: Unspecified intestinal obstruction, unspecified as to partial versus complete obstruction: K56.609

## 2021-08-17 HISTORY — DX: Dementia in other diseases classified elsewhere, severe, without behavioral disturbance, psychotic disturbance, mood disturbance, and anxiety: F02.C0

## 2021-08-17 LAB — TYPE AND SCREEN
ABO/RH(D): O POS
Antibody Screen: NEGATIVE

## 2021-08-17 LAB — CBC WITH DIFFERENTIAL/PLATELET
Abs Immature Granulocytes: 0.02 10*3/uL (ref 0.00–0.07)
Basophils Absolute: 0 10*3/uL (ref 0.0–0.1)
Basophils Relative: 0 %
Eosinophils Absolute: 0 10*3/uL (ref 0.0–0.5)
Eosinophils Relative: 1 %
HCT: 39.1 % (ref 39.0–52.0)
Hemoglobin: 13.4 g/dL (ref 13.0–17.0)
Immature Granulocytes: 0 %
Lymphocytes Relative: 15 %
Lymphs Abs: 1.1 10*3/uL (ref 0.7–4.0)
MCH: 31.8 pg (ref 26.0–34.0)
MCHC: 34.3 g/dL (ref 30.0–36.0)
MCV: 92.7 fL (ref 80.0–100.0)
Monocytes Absolute: 0.5 10*3/uL (ref 0.1–1.0)
Monocytes Relative: 7 %
Neutro Abs: 5.4 10*3/uL (ref 1.7–7.7)
Neutrophils Relative %: 77 %
Platelets: 177 10*3/uL (ref 150–400)
RBC: 4.22 MIL/uL (ref 4.22–5.81)
RDW: 12.6 % (ref 11.5–15.5)
WBC: 7 10*3/uL (ref 4.0–10.5)
nRBC: 0 % (ref 0.0–0.2)

## 2021-08-17 LAB — COMPREHENSIVE METABOLIC PANEL
ALT: 19 U/L (ref 0–44)
AST: 16 U/L (ref 15–41)
Albumin: 3.8 g/dL (ref 3.5–5.0)
Alkaline Phosphatase: 55 U/L (ref 38–126)
Anion gap: 9 (ref 5–15)
BUN: 36 mg/dL — ABNORMAL HIGH (ref 8–23)
CO2: 29 mmol/L (ref 22–32)
Calcium: 8.5 mg/dL — ABNORMAL LOW (ref 8.9–10.3)
Chloride: 93 mmol/L — ABNORMAL LOW (ref 98–111)
Creatinine, Ser: 1.84 mg/dL — ABNORMAL HIGH (ref 0.61–1.24)
GFR, Estimated: 41 mL/min — ABNORMAL LOW (ref >60)
Glucose, Bld: 147 mg/dL — ABNORMAL HIGH (ref 70–99)
Potassium: 3.3 mmol/L — ABNORMAL LOW (ref 3.5–5.1)
Sodium: 131 mmol/L — ABNORMAL LOW (ref 135–145)
Total Bilirubin: 0.8 mg/dL (ref 0.3–1.2)
Total Protein: 7.5 g/dL (ref 6.5–8.1)

## 2021-08-17 LAB — LIPASE, BLOOD: Lipase: 23 U/L (ref 11–51)

## 2021-08-17 LAB — PROTIME-INR
INR: 1 (ref 0.8–1.2)
Prothrombin Time: 13.6 seconds (ref 11.4–15.2)

## 2021-08-17 LAB — LACTIC ACID, PLASMA
Lactic Acid, Venous: 1.7 mmol/L (ref 0.5–1.9)
Lactic Acid, Venous: 1.5 mmol/L (ref 0.5–1.9)

## 2021-08-17 LAB — POC OCCULT BLOOD, ED: Fecal Occult Bld: NEGATIVE

## 2021-08-17 MED ORDER — ONDANSETRON HCL 4 MG/2ML IJ SOLN
4.0000 mg | INTRAMUSCULAR | Status: AC
  Administered 2021-08-17: 4 mg via INTRAVENOUS
  Filled 2021-08-17: qty 2

## 2021-08-17 MED ORDER — SODIUM CHLORIDE 0.9 % IV SOLN: INTRAVENOUS | Status: AC

## 2021-08-17 MED ORDER — IOHEXOL 300 MG/ML  SOLN
75.0000 mL | Freq: Once | INTRAMUSCULAR | Status: AC | PRN
  Administered 2021-08-17: 75 mL via INTRAVENOUS

## 2021-08-17 NOTE — ED Notes (Signed)
Hemoccult from stool sample was negative

## 2021-08-17 NOTE — ED Triage Notes (Signed)
Pt brought in by [facility] from [facility] for emesis with possible coffee ground emesis. Pt has hx of dementia, pt has no complaints at this time.

## 2021-08-17 NOTE — ED Notes (Signed)
Pt pulled out NGT while family came out to the desk

## 2021-08-17 NOTE — ED Provider Notes (Signed)
[facility] EMERGENCY DEPARTMENT Provider Note   CSN: [id number] Arrival date & time: 08/17/21  1936     History  Chief Complaint  Patient presents with   Emesis    Warren Sullivan is a 61 y.o. male.   Emesis  This patient is a 61 year old male, history of prior stroke, hypertension, currently resides at [facility], presents to the hospital from his nursing facility with a complaint of vomiting with possibly having some blood in the vomit.  The patient has dementia and cannot give any other valuable information, was witnessed to have some coffee-ground emesis by paramedics.  Patient denies any pain vomiting nausea shortness of breath or fevers.  Patient cannot give me any other further information  Home Medications Prior to Admission medications   Medication Sig Start Date End Date Taking? Authorizing Provider  acetaminophen (TYLENOL) 650 MG CR tablet Take 650 mg by mouth every 8 (eight) hours as needed for pain.   Yes [provider]  aspirin EC 81 MG tablet Take 81 mg by mouth daily. Swallow whole.   Yes [provider]  atorvastatin (LIPITOR) 20 MG tablet Take 20 mg by mouth daily. 07/24/21  Yes [provider]  Calcium Carb-Cholecalciferol (CALCIUM-VITAMIN D3) 250-3.125 MG-MCG TABS Take 1 tablet by mouth daily.   Yes [provider]  FLUoxetine (PROZAC) 20 MG capsule Take 20 mg by mouth daily. 08/01/21  Yes [provider]  gabapentin (NEURONTIN) 300 MG capsule Take 1 capsule (300 mg total) by mouth 3 (three) times daily. Start with one at bedtime for 1 week. Then two at bedtime for one week, then go to full dose. 10/19/15  Yes Stacks, Cletus Gash, MD  hydrochlorothiazide (HYDRODIURIL) 25 MG tablet Take 1 tablet (25 mg total) by mouth daily. 01/20/15  Yes Stacks, Cletus Gash, MD  losartan (COZAAR) 100 MG tablet Take 100 mg by mouth daily. 08/07/21  Yes [provider]  memantine (NAMENDA) 10 MG tablet Take 10 mg by mouth 2 (two)  times daily. 07/24/21  Yes [provider]  Multiple Vitamin (MULTIVITAMIN) tablet Take 1 tablet by mouth daily.   Yes [provider]  tamsulosin (FLOMAX) 0.4 MG CAPS capsule Take 0.4 mg by mouth daily. 07/11/21  Yes [provider]  VASCEPA 1 g capsule Take 2 g by mouth 2 (two) times daily. 08/08/21  Yes [provider]  vitamin B-12 (CYANOCOBALAMIN) 500 MCG tablet Take 500 mcg by mouth daily.   Yes [provider]  Vitamin D3 (VITAMIN D) 25 MCG tablet Take 5,000 Units by mouth daily.   Yes [provider]  cloNIDine (CATAPRES) 0.2 MG tablet Take 1 tablet (0.2 mg total) by mouth 3 (three) times daily. Patient not taking: Reported on 08/17/2021 06/14/15   Claretta Fraise, MD  irbesartan (AVAPRO) 300 MG tablet Take 1 tablet (300 mg total) by mouth daily. For Blood pressure Patient not taking: Reported on 08/17/2021 03/09/15   Claretta Fraise, MD      Allergies    Patient has no known allergies.    Review of Systems   Review of Systems  Unable to perform ROS: Dementia  Gastrointestinal:  Positive for vomiting.   Physical Exam Updated Vital Signs BP 126/76    Pulse 98    Temp 98.2 F (36.8 C) (Oral)    Resp 18    Ht 1.829 m (6')    Wt 94.3 kg    SpO2 92%    BMI 28.20 kg/m  Physical Exam Vitals  and nursing note reviewed.  Constitutional:      General: He is not in acute distress.    Appearance: He is well-developed.  HENT:     Head: Normocephalic and atraumatic.     Mouth/Throat:     Pharynx: No oropharyngeal exudate.  Eyes:     General: No scleral icterus.       Right eye: No discharge.        Left eye: No discharge.     Conjunctiva/sclera: Conjunctivae normal.     Pupils: Pupils are equal, round, and reactive to light.  Neck:     Thyroid: No thyromegaly.     Vascular: No JVD.  Cardiovascular:     Rate and Rhythm: Normal rate and regular rhythm.     Heart sounds: Normal heart sounds. No murmur heard.   No friction rub. No gallop.   Pulmonary:     Effort: Pulmonary effort is normal. No respiratory distress.     Breath sounds: Normal breath sounds. No wheezing or rales.  Abdominal:     General: Bowel sounds are normal. There is distension.     Palpations: Abdomen is soft. There is no mass.     Tenderness: There is abdominal tenderness.     Comments: Abdomen is distended with tympanitic sounds to percussion at high tinkling bowel sounds  Musculoskeletal:        General: No tenderness. Normal range of motion.     Cervical back: Normal range of motion and neck supple.     Right lower leg: No edema.     Left lower leg: No edema.  Lymphadenopathy:     Cervical: No cervical adenopathy.  Skin:    General: Skin is warm and dry.     Findings: No erythema or rash.  Neurological:     Mental Status: He is alert.     Coordination: Coordination normal.     Comments: The patient is able to move all 4 extremities, some generalized weakness, no obvious facial droop, some confusion present  Psychiatric:        Behavior: Behavior normal.    ED Results / Procedures / Treatments   Labs (all labs ordered are listed, but only abnormal results are displayed) Labs Reviewed  COMPREHENSIVE METABOLIC PANEL - Abnormal; Notable for the following components:      Result Value   Sodium 131 (*)    Potassium 3.3 (*)    Chloride 93 (*)    Glucose, Bld 147 (*)    BUN 36 (*)    Creatinine, Ser 1.84 (*)    Calcium 8.5 (*)    GFR, Estimated 41 (*)    All other components within normal limits  URINE CULTURE  CBC WITH DIFFERENTIAL/PLATELET  LIPASE, BLOOD  LACTIC ACID, PLASMA  PROTIME-INR  LACTIC ACID, PLASMA  URINALYSIS, ROUTINE W REFLEX MICROSCOPIC  TYPE AND SCREEN    EKG None  Radiology CT ABDOMEN PELVIS W CONTRAST  Result Date: 08/17/2021 CLINICAL DATA:  Acute nonlocalized abdominal pain with distended abdomen and vomiting. EXAM: CT ABDOMEN AND PELVIS WITH CONTRAST TECHNIQUE: Multidetector CT imaging of the abdomen and pelvis  was performed using the standard protocol following bolus administration of intravenous contrast. RADIATION DOSE REDUCTION: This exam was performed according to the departmental dose-optimization program which includes automated exposure control, adjustment of the mA and/or kV according to patient size and/or use of iterative reconstruction technique. CONTRAST:  78mL OMNIPAQUE IOHEXOL 300 MG/ML  SOLN COMPARISON:  None. FINDINGS: Lower chest: Atelectasis or  infiltration in the lung bases. Mild cardiac enlargement with small pericardial effusion. Small esophageal hiatal hernia. Thick walled lower esophagus may represent reflux disease, esophagitis, or possibly an esophageal mass. Prominent paraesophageal lymph nodes. Hepatobiliary: No focal liver abnormality is seen. No gallstones, gallbladder wall thickening, or biliary dilatation. Pancreas: Unremarkable. No pancreatic ductal dilatation or surrounding inflammatory changes. Spleen: Normal in size without focal abnormality. Adrenals/Urinary Tract: Adrenal glands are unremarkable. Kidneys are normal, without renal calculi, focal lesion, or hydronephrosis. Bladder is unremarkable. Stomach/Bowel: Mildly dilated fluid-filled small bowel with multiple air-fluid levels. The colon is decompressed. Terminal ileum also appears decompressed. Mild small bowel wall thickening. Vascular/Lymphatic: Extensive aortoiliac calcifications. No aneurysm. No significant lymphadenopathy. Reproductive: Mild prostate enlargement. Other: Small periumbilical hernia containing fat. No free air or free fluid. Musculoskeletal: Degenerative changes in the spine. IMPRESSION: 1. Dilated fluid-filled small bowel with air-fluid levels, bowel wall thickening, and decompressed terminal ileum. Changes are likely small bowel obstruction but could indicate enteritis. 2. Small esophageal hiatal hernia. Marked wall thickening of the esophagus with paraesophageal lymphadenopathy. Changes could represent reflux  or esophagitis but are worrisome for esophageal mass. The area is incompletely visualized. Suggest chest CT for further evaluation. 3. Infiltration or atelectasis in the lung bases. 4. Extensive aortic atherosclerosis. 5. Prostate gland is enlarged. Electronically Signed   By: Lucienne Capers M.D.   On: 08/17/2021 21:58    Procedures Procedures    Medications Ordered in ED Medications  0.9 %  sodium chloride infusion ( Intravenous New Bag/Given 08/17/21 2039)  ondansetron (ZOFRAN) injection 4 mg (4 mg Intravenous Given 08/17/21 2038)  iohexol (OMNIPAQUE) 300 MG/ML solution 75 mL (75 mLs Intravenous Contrast Given 08/17/21 2132)    ED Course/ Medical Decision Making/ A&P                           Medical Decision Making Amount and/or Complexity of Data Reviewed Labs: ordered. Radiology: ordered.  Risk Prescription drug management. Decision regarding hospitalization.   There is concern for possible gastrointestinal bleed given reported history.  His stools on exam are Hemoccult negative, he had some fecal incontinence and required cleaning.  Abdomen is distended concerning for bowel obstruction, would consider tumor, adhesions, this could be primary peptic ulcer disease or pancreatitis or gallbladder disease.  CT will be ordered with labs.  This patient presents to the ED for concern of abdominal pain and vomiting this involves an extensive number of treatment options, and is a complaint that carries with it a high risk of complications and morbidity.     Co morbidities that complicate the patient evaluation  Prior weakness unilaterally, hypertension, nursing home patient   Additional history obtained:  Additional history obtained from electronic medical record External records from outside source obtained and reviewed including prior MRI showing prior stroke   Lab Tests:  I Ordered, and personally interpreted labs.  The pertinent results include: CBC without a leukocytosis, no  anemia, metabolic panel with an acute kidney injury with a creatinine of 1.84 and a BUN of 36, this is significantly up from a creatinine of 0.94.  Slight hypokalemia and hyponatremia.  Normal lactic acid.  Lipase is normal, INR is normal.   Imaging Studies ordered:  I ordered imaging studies including CT scan of the abdomen and pelvis with contrast I independently visualized and interpreted imaging which showed the patient does in fact have what appears to be small bowel obstruction, there is some lower chest atelectasis or infiltrates  in the lung bases, the patient is not coughing nor is he short of breath.  There does appear to be what appears to be a small bowel obstruction.  They comment that this could be enteritis however the patient is having no diarrhea and he is having vomiting with a distended abdomen.  There is no necessarily transition point or obvious cause of this I agree with the radiologist interpretation   Cardiac Monitoring:  The patient was maintained on a cardiac monitor.  I personally viewed and interpreted the cardiac monitored which showed an underlying rhythm of: Normal sinus rhythm, borderline tachycardia   Medicines ordered and prescription drug management:  I ordered medication including IV fluids and antiemetics for bowel obstruction and dehydration Reevaluation of the patient after these medicines showed that the patient improved I have reviewed the patients home medicines and have made adjustments as needed   Disposition considered:  Admission to the hospital due to the patient's bowel obstruction with acute kidney injury   Critical Interventions:  NG tube ordered for placement CT scan to identify bowel obstruction IV fluids to rehydrate kidney injury   Consultations Obtained:  I requested consultation with the hospitalist,  and discussed lab and imaging findings as well as pertinent plan - they recommend: Admission to the hospital.  Care was also  discussed with Dr. Arnoldo Morale, agrees that this is not a surgical case at this time.   Problem List / ED Course:  Small bowel obstruction, NG tube placed, IV fluids Acute kidney injury, IV fluids for dehydration, urinalysis pending   Reevaluation:  After the interventions noted above, I reevaluated the patient and found that they have :improved   Social Determinants of Health:  Nursing home patient   Dispostion:  After consideration of the diagnostic results and the patients response to treatment, I feel that the patent would benefit from admission to the hospital.          Final Clinical Impression(s) / ED Diagnoses Final diagnoses:  Small bowel obstruction ([place])  AKI (acute kidney injury) [facility])    Rx / [place] Orders ED Discharge Orders     None         Noemi Chapel, MD 08/18/21 [id number]

## 2021-08-17 NOTE — H&P (Signed)
History and Physical    Patient: Warren Sullivan [id number] DOB: 1961-03-14 DOA: 08/17/2021 DOS: the patient was seen and examined on 08/18/2021 PCP: Mechele Claude, MD  Patient coming from: SNF  Chief Complaint:  Chief Complaint  Patient presents with   Emesis    HPI: Warren Sullivan is a 61 y.o. male with medical history significant of prior stroke, hypertension, hyperlipidemia, dementia, BPH who presents to the emergency department from [facility] due to complaint of vomiting this afternoon with presumed blood in vomitus.  Patient has a history of dementia, he endorsed vomiting, but denies any blood in vomiting, however, some coffee-ground emesis was witnessed by EMS per ED medical record.  He denies headache, shortness of breath, chest pain, constipation (states last bowel movement was this afternoon around 4 PM).  ED course: In the emergency department, he was hemodynamically stable.  Work-up in the ED showed normal CBC, hyponatremia, hypokalemia, BUN/creatinine 36/1.84 (no recent labs for comparison) and hyperglycemia.  Calcium 8.5, lipase 23, lactic acid 1.5, FOBT negative. CT abdomen and pelvis with contrast was suggestive of small bowel obstruction with suspicion for enteritis.  Small esophageal hiatal hernia. Marked wall thickening of the esophagus with paraesophageal lymphadenopathy. Changes could represent reflux or esophagitis but are worrisome for esophageal mass. IV hydration was provided, NG tube was indicated to be given in the ED.  Hospitalist was asked to admit patient for further evaluation and management.   Review of Systems: As mentioned in the history of present illness. All other systems reviewed and are negative. Past Medical History:  Diagnosis Date   CVA (cerebral vascular accident) (HCC)    Dementia in other diseases classified elsewhere, severe, without behavioral disturbance, psychotic disturbance, mood disturbance, and anxiety    Hemiplegia,  unspecified affecting right dominant side (HCC)    Hyperlipidemia    Hypertension    Stroke (HCC)    Vitamin D deficiency    Past Surgical History:  Procedure Laterality Date   HERNIA REPAIR     Social History:  reports that he has been smoking. He does not have any smokeless tobacco history on file. He reports that he does not drink alcohol and does not use drugs.  No Known Allergies  Family History  Problem Relation Age of Onset   Hypertension Mother    Stroke Mother    Hypertension Father    Hypertension Sister     Prior to Admission medications   Medication Sig Start Date End Date Taking? Authorizing Provider  acetaminophen (TYLENOL) 650 MG CR tablet Take 650 mg by mouth every 8 (eight) hours as needed for pain.   Yes [provider]  aspirin EC 81 MG tablet Take 81 mg by mouth daily. Swallow whole.   Yes [provider]  atorvastatin (LIPITOR) 20 MG tablet Take 20 mg by mouth daily. 07/24/21  Yes [provider]  Calcium Carb-Cholecalciferol (CALCIUM-VITAMIN D3) 250-3.125 MG-MCG TABS Take 1 tablet by mouth daily.   Yes [provider]  FLUoxetine (PROZAC) 20 MG capsule Take 20 mg by mouth daily. 08/01/21  Yes [provider]  gabapentin (NEURONTIN) 300 MG capsule Take 1 capsule (300 mg total) by mouth 3 (three) times daily. Start with one at bedtime for 1 week. Then two at bedtime for one week, then go to full dose. 10/19/15  Yes Stacks, Broadus John, MD  hydrochlorothiazide (HYDRODIURIL) 25 MG tablet Take 1 tablet (25 mg total) by mouth daily. 01/20/15  Yes Mechele Claude, MD  losartan (COZAAR) 100  MG tablet Take 100 mg by mouth daily. 08/07/21  Yes [provider]  memantine (NAMENDA) 10 MG tablet Take 10 mg by mouth 2 (two) times daily. 07/24/21  Yes [provider]  Multiple Vitamin (MULTIVITAMIN) tablet Take 1 tablet by mouth daily.   Yes [provider]  tamsulosin (FLOMAX) 0.4 MG CAPS capsule Take 0.4 mg by  mouth daily. 07/11/21  Yes [provider]  VASCEPA 1 g capsule Take 2 g by mouth 2 (two) times daily. 08/08/21  Yes [provider]  vitamin B-12 (CYANOCOBALAMIN) 500 MCG tablet Take 500 mcg by mouth daily.   Yes [provider]  Vitamin D3 (VITAMIN D) 25 MCG tablet Take 5,000 Units by mouth daily.   Yes [provider]  cloNIDine (CATAPRES) 0.2 MG tablet Take 1 tablet (0.2 mg total) by mouth 3 (three) times daily. Patient not taking: Reported on 08/17/2021 06/14/15   Mechele Claude, MD  irbesartan (AVAPRO) 300 MG tablet Take 1 tablet (300 mg total) by mouth daily. For Blood pressure Patient not taking: Reported on 08/17/2021 03/09/15   Mechele Claude, MD    Physical Exam: Vitals:   08/17/21 2300 08/17/21 2330 08/17/21 2345 08/18/21 0031  BP: 118/74 123/79  123/80  Pulse: 67 73 83 75  Resp: 17 18 14 18   Temp:    98.9 F (37.2 C)  TempSrc:    Oral  SpO2: 98% 91%  93%  Weight:      Height:        Physical Exam  Gen:- Awake Alert and oriented x3.  Well-developed and in no acute distress. HEENT:- [place].AT, No sclera icterus, dry mucous membranes. Neck-Supple Neck,No JVD,.  Lungs-  CTAB, no wheezes or rhonchi CV- S1, S2 normal Abd-  +ve B.Sounds, Abd Soft but mildly distended, No tenderness,    Extremity/Skin:- No  edema,   skin warm and dry Psych-affect is appropriate, oriented x3 Neuro-no new focal deficits, no tremors   Data Reviewed: EKG personally reviewed showed normal sinus rhythm at rate of 86 bpm  Assessment and Plan: * Small bowel obstruction (HCC)- (present on admission) Continue NG tube  Continue NPO at this time with plan to advance diet as tolerated Continue IV hydration Continue IV morphine 2 mg q.4h p.r.n. for moderate/severe pain Continue zofran p.r.n. for nausea/vomiting Consider surgical consult if abdominal pain continues to worsen   Dementia without behavioral disturbance (HCC) Continue home meds when patient resumes oral  intake  Hyperlipidemia, unspecified Continue home meds when patient resumes oral intake  Dehydration Continue IV hydration  Esophageal mass CT abdomen and pelvis with contrast was suggestive of esophageal mass Consider chest CT for further evaluation after improvement in creatinine  GERD (gastroesophageal reflux disease) Continue Protonix  Vomiting Continue Zofran as needed  Hyperglycemia CBG 147; this is possibly reactive Continue to monitor blood glucose level with morning labs  Hypocalcemia Continue Os-[place]  AKI (acute kidney injury) (HCC) BUN/creatinine 36/1.84 (no recent labs for comparison) Continue IV hydration Renally adjust medications, avoid nephrotoxic agents/dehydration/hypotension   Hyponatremia Continue IV hydration and continue to monitor sodium levels with morning labs  Hypokalemia K+ 3.3, this will be replenished  Essential hypertension BP currently controlled Continue home meds when patient resumes oral intake    Advance Care Planning:   Code Status: Full Code   Consults: None  Family Communication: Sister at bedside (all questions answered to her satisfaction)  Severity of Illness: The appropriate patient status for this patient is INPATIENT. Inpatient status is judged  to be reasonable and necessary in order to provide the required intensity of service to ensure the patient's safety. The patient's presenting symptoms, physical exam findings, and initial radiographic and laboratory data in the context of their chronic comorbidities is felt to place them at high risk for further clinical deterioration. Furthermore, it is not anticipated that the patient will be medically stable for discharge from the hospital within 2 midnights of admission.   * I certify that at the point of admission it is my clinical judgment that the patient will require inpatient hospital care spanning beyond 2 midnights from the point of admission due to high intensity of  service, high risk for further deterioration and high frequency of surveillance required.*  Author: Frankey Shown, DO 08/18/2021 1:00 AM  For on call review [email or web address].

## 2021-08-18 ENCOUNTER — Inpatient Hospital Stay (HOSPITAL_COMMUNITY): Payer: Medicare Other | Admitting: Certified Registered Nurse Anesthetist

## 2021-08-18 ENCOUNTER — Encounter (HOSPITAL_COMMUNITY)
Admission: EM | Disposition: A | Discharge status: Skilled Nursing Facility | Payer: Self-pay | Source: Skilled Nursing Facility | Attending: Family Medicine

## 2021-08-18 ENCOUNTER — Other Ambulatory Visit: Payer: Self-pay

## 2021-08-18 ENCOUNTER — Encounter (HOSPITAL_COMMUNITY): Payer: Self-pay | Admitting: Internal Medicine

## 2021-08-18 ENCOUNTER — Inpatient Hospital Stay (HOSPITAL_COMMUNITY): Payer: Medicare Other

## 2021-08-18 DIAGNOSIS — E860 Dehydration: Secondary | ICD-10-CM

## 2021-08-18 DIAGNOSIS — K2289 Other specified disease of esophagus: Secondary | ICD-10-CM

## 2021-08-18 DIAGNOSIS — E871 Hypo-osmolality and hyponatremia: Secondary | ICD-10-CM

## 2021-08-18 DIAGNOSIS — I10 Essential (primary) hypertension: Secondary | ICD-10-CM

## 2021-08-18 DIAGNOSIS — N179 Acute kidney failure, unspecified: Secondary | ICD-10-CM

## 2021-08-18 DIAGNOSIS — F0390 Unspecified dementia without behavioral disturbance: Secondary | ICD-10-CM

## 2021-08-18 DIAGNOSIS — K259 Gastric ulcer, unspecified as acute or chronic, without hemorrhage or perforation: Secondary | ICD-10-CM

## 2021-08-18 DIAGNOSIS — K3189 Other diseases of stomach and duodenum: Secondary | ICD-10-CM

## 2021-08-18 DIAGNOSIS — K449 Diaphragmatic hernia without obstruction or gangrene: Secondary | ICD-10-CM

## 2021-08-18 DIAGNOSIS — E876 Hypokalemia: Secondary | ICD-10-CM

## 2021-08-18 DIAGNOSIS — K56609 Unspecified intestinal obstruction, unspecified as to partial versus complete obstruction: Secondary | ICD-10-CM | POA: Diagnosis not present

## 2021-08-18 DIAGNOSIS — K920 Hematemesis: Secondary | ICD-10-CM

## 2021-08-18 DIAGNOSIS — R1110 Vomiting, unspecified: Secondary | ICD-10-CM

## 2021-08-18 DIAGNOSIS — R739 Hyperglycemia, unspecified: Secondary | ICD-10-CM

## 2021-08-18 DIAGNOSIS — R933 Abnormal findings on diagnostic imaging of other parts of digestive tract: Secondary | ICD-10-CM

## 2021-08-18 DIAGNOSIS — K219 Gastro-esophageal reflux disease without esophagitis: Secondary | ICD-10-CM

## 2021-08-18 DIAGNOSIS — E785 Hyperlipidemia, unspecified: Secondary | ICD-10-CM

## 2021-08-18 HISTORY — PX: BIOPSY: SHX5522

## 2021-08-18 HISTORY — DX: Dehydration: E86.0

## 2021-08-18 HISTORY — PX: ESOPHAGOGASTRODUODENOSCOPY (EGD) WITH PROPOFOL: SHX5813

## 2021-08-18 HISTORY — DX: Acute kidney failure, unspecified: N17.9

## 2021-08-18 LAB — CBC
HCT: 34.3 % — ABNORMAL LOW (ref 39.0–52.0)
Hemoglobin: 11.2 g/dL — ABNORMAL LOW (ref 13.0–17.0)
MCH: 30.3 pg (ref 26.0–34.0)
MCHC: 32.7 g/dL (ref 30.0–36.0)
MCV: 92.7 fL (ref 80.0–100.0)
Platelets: 146 10*3/uL — ABNORMAL LOW (ref 150–400)
RBC: 3.7 MIL/uL — ABNORMAL LOW (ref 4.22–5.81)
RDW: 12.5 % (ref 11.5–15.5)
WBC: 7.2 10*3/uL (ref 4.0–10.5)
nRBC: 0 % (ref 0.0–0.2)

## 2021-08-18 LAB — URINALYSIS, ROUTINE W REFLEX MICROSCOPIC
Bacteria, UA: NONE SEEN
Bilirubin Urine: NEGATIVE
Glucose, UA: NEGATIVE mg/dL
Hgb urine dipstick: NEGATIVE
Ketones, ur: NEGATIVE mg/dL
Leukocytes,Ua: NEGATIVE
Nitrite: NEGATIVE
Protein, ur: 30 mg/dL — AB
Specific Gravity, Urine: >1.046 — ABNORMAL HIGH (ref 1.005–1.030)
pH: 6 (ref 5.0–8.0)

## 2021-08-18 LAB — COMPREHENSIVE METABOLIC PANEL
ALT: 16 U/L (ref 0–44)
AST: 14 U/L — ABNORMAL LOW (ref 15–41)
Albumin: 3.3 g/dL — ABNORMAL LOW (ref 3.5–5.0)
Alkaline Phosphatase: 50 U/L (ref 38–126)
Anion gap: 5 (ref 5–15)
BUN: 34 mg/dL — ABNORMAL HIGH (ref 8–23)
CO2: 30 mmol/L (ref 22–32)
Calcium: 8.2 mg/dL — ABNORMAL LOW (ref 8.9–10.3)
Chloride: 102 mmol/L (ref 98–111)
Creatinine, Ser: 1.49 mg/dL — ABNORMAL HIGH (ref 0.61–1.24)
GFR, Estimated: 53 mL/min — ABNORMAL LOW (ref >60)
Glucose, Bld: 109 mg/dL — ABNORMAL HIGH (ref 70–99)
Potassium: 3.7 mmol/L (ref 3.5–5.1)
Sodium: 137 mmol/L (ref 135–145)
Total Bilirubin: 1 mg/dL (ref 0.3–1.2)
Total Protein: 6.3 g/dL — ABNORMAL LOW (ref 6.5–8.1)

## 2021-08-18 LAB — GLUCOSE, CAPILLARY: Glucose-Capillary: 124 mg/dL — ABNORMAL HIGH (ref 70–99)

## 2021-08-18 LAB — RESP PANEL BY RT-PCR (FLU A&B, COVID) ARPGX2
Influenza A by PCR: NEGATIVE
Influenza B by PCR: NEGATIVE
SARS Coronavirus 2 by RT PCR: NEGATIVE

## 2021-08-18 LAB — HIV ANTIBODY (ROUTINE TESTING W REFLEX): HIV Screen 4th Generation wRfx: NONREACTIVE

## 2021-08-18 SURGERY — ESOPHAGOGASTRODUODENOSCOPY (EGD) WITH PROPOFOL
Anesthesia: General

## 2021-08-18 MED ORDER — PHENYLEPHRINE 40 MCG/ML (10ML) SYRINGE FOR IV PUSH (FOR BLOOD PRESSURE SUPPORT)
PREFILLED_SYRINGE | INTRAVENOUS | Status: AC
  Filled 2021-08-18: qty 10

## 2021-08-18 MED ORDER — LACTATED RINGERS IV SOLN: INTRAVENOUS | Status: DC | PRN

## 2021-08-18 MED ORDER — PHENYLEPHRINE 40 MCG/ML (10ML) SYRINGE FOR IV PUSH (FOR BLOOD PRESSURE SUPPORT)
PREFILLED_SYRINGE | INTRAVENOUS | Status: DC | PRN
  Administered 2021-08-18 (×3): 40 ug via INTRAVENOUS

## 2021-08-18 MED ORDER — LABETALOL HCL 5 MG/ML IV SOLN: 10.0000 mg | INTRAVENOUS | Status: DC | PRN

## 2021-08-18 MED ORDER — HALOPERIDOL LACTATE 5 MG/ML IJ SOLN
5.0000 mg | Freq: Four times a day (QID) | INTRAMUSCULAR | Status: DC | PRN
  Administered 2021-08-18: 5 mg via INTRAMUSCULAR
  Filled 2021-08-18: qty 1

## 2021-08-18 MED ORDER — POTASSIUM CHLORIDE 10 MEQ/100ML IV SOLN
10.0000 meq | INTRAVENOUS | Status: AC
  Administered 2021-08-18 (×2): 10 meq via INTRAVENOUS
  Filled 2021-08-18 (×2): qty 100

## 2021-08-18 MED ORDER — PANTOPRAZOLE SODIUM 40 MG PO TBEC: 40.0000 mg | DELAYED_RELEASE_TABLET | Freq: Every day | ORAL | Status: DC

## 2021-08-18 MED ORDER — PROPOFOL 500 MG/50ML IV EMUL
INTRAVENOUS | Status: DC | PRN
  Administered 2021-08-18: 50 ug/kg/min via INTRAVENOUS

## 2021-08-18 MED ORDER — DEXTROSE-NACL 5-0.45 % IV SOLN: INTRAVENOUS | Status: DC

## 2021-08-18 MED ORDER — LORAZEPAM 2 MG/ML IJ SOLN
1.0000 mg | Freq: Four times a day (QID) | INTRAMUSCULAR | Status: DC | PRN
  Administered 2021-08-19: 1 mg via INTRAVENOUS
  Filled 2021-08-18: qty 1

## 2021-08-18 MED ORDER — MORPHINE SULFATE (PF) 2 MG/ML IV SOLN
2.0000 mg | INTRAVENOUS | Status: DC | PRN
  Administered 2021-08-18: 2 mg via INTRAVENOUS
  Filled 2021-08-18: qty 1

## 2021-08-18 MED ORDER — LIDOCAINE HCL (PF) 2 % IJ SOLN
INTRAMUSCULAR | Status: AC
  Filled 2021-08-18: qty 5

## 2021-08-18 MED ORDER — LIDOCAINE 2% (20 MG/ML) 5 ML SYRINGE
INTRAMUSCULAR | Status: DC | PRN
Start: 2021-08-18 — End: 2021-08-18
  Administered 2021-08-18: 40 mg via INTRAVENOUS

## 2021-08-18 MED ORDER — PANTOPRAZOLE SODIUM 40 MG IV SOLR
40.0000 mg | Freq: Two times a day (BID) | INTRAVENOUS | Status: DC
  Administered 2021-08-18 – 2021-08-20 (×4): 40 mg via INTRAVENOUS
  Filled 2021-08-18 (×4): qty 10

## 2021-08-18 MED ORDER — OYSTER SHELL CALCIUM/D3 500-5 MG-MCG PO TABS
1.0000 | ORAL_TABLET | Freq: Every day | ORAL | Status: DC
  Administered 2021-08-19 – 2021-08-20 (×2): 1 via ORAL
  Filled 2021-08-18 (×3): qty 1

## 2021-08-18 MED ORDER — ONDANSETRON HCL 4 MG/2ML IJ SOLN: 4.0000 mg | Freq: Four times a day (QID) | INTRAMUSCULAR | Status: DC | PRN

## 2021-08-18 MED ORDER — PROPOFOL 10 MG/ML IV BOLUS
INTRAVENOUS | Status: DC | PRN
  Administered 2021-08-18: 20 mg via INTRAVENOUS

## 2021-08-18 MED ORDER — SODIUM CHLORIDE 0.9 % IV SOLN: INTRAVENOUS | Status: DC

## 2021-08-18 MED ORDER — PANTOPRAZOLE SODIUM 40 MG IV SOLR
40.0000 mg | INTRAVENOUS | Status: DC
  Administered 2021-08-18: 40 mg via INTRAVENOUS
  Filled 2021-08-18: qty 10

## 2021-08-18 NOTE — Consult Note (Signed)
Warren Sullivan 1961-05-26 [id number]  Referring Provider: Roxan Hockey, MD Primary Care Physician:  Claretta Fraise, MD Primary Gastroenterologist:  Previously unassigned. Dr. Jenetta Downer Admission Date: 08/17/21 Consultation Date: 08/18/21      Reason for Consultation:  esophageal mass   History of Present Illness   Warren Sullivan is a 61 y.o. male with PMH significant for prior CVA, HTN, hyperlipidemia, dementia, BPH presenting to the ED from [facility] due to bloody emesis. Coffee-ground emesis witnessed by EMS per ED records. GI consulted for possible esophageal mass.   In the ED, Hgb 13.4, platelets 177000, BUN 36, Cre 1.84, sodium 131, potassium 3.3. INR 1.0. CT A/P with contrast with thick walled lower esophagus, small esophageal hiatal hernia, prominent paraesophageal lymph nodes, mildly dilated fluid-filled small bowel with multiple air-fluid levels. Colon and TI decompressed. Mild small bowel wall thickening. Hgb down to 11.2 today. Platelets 146,000.   Patient has removed his NGT today. He is a limited historian due to his dementia. Son and sister-in-law at bedside but not aware of the events over the last 24 hours. Patient denies abdominal pain. He is hungry. States his BMs are normal. Denies swallowing issues. History unreliable however. He resides at [facility] for several years, limited mobility since his stroke.  It is unknown if he has ever had EGD or colonoscopy.   Prior to Admission medications   Medication Sig Start Date End Date Taking? Authorizing Provider  acetaminophen (TYLENOL) 650 MG CR tablet Take 650 mg by mouth every 8 (eight) hours as needed for pain.   Yes [provider]  aspirin EC 81 MG tablet Take 81 mg by mouth daily. Swallow whole.   Yes [provider]  atorvastatin (LIPITOR) 20 MG tablet Take 20 mg by mouth daily. 07/24/21  Yes [provider]  Calcium Carb-Cholecalciferol (CALCIUM-VITAMIN D3) 250-3.125 MG-MCG TABS Take 1  tablet by mouth daily.   Yes [provider]  FLUoxetine (PROZAC) 20 MG capsule Take 20 mg by mouth daily. 08/01/21  Yes [provider]  gabapentin (NEURONTIN) 300 MG capsule Take 1 capsule (300 mg total) by mouth 3 (three) times daily. Start with one at bedtime for 1 week. Then two at bedtime for one week, then go to full dose. 10/19/15  Yes Stacks, Cletus Gash, MD  hydrochlorothiazide (HYDRODIURIL) 25 MG tablet Take 1 tablet (25 mg total) by mouth daily. 01/20/15  Yes Stacks, Cletus Gash, MD  losartan (COZAAR) 100 MG tablet Take 100 mg by mouth daily. 08/07/21  Yes [provider]  memantine (NAMENDA) 10 MG tablet Take 10 mg by mouth 2 (two) times daily. 07/24/21  Yes [provider]  Multiple Vitamin (MULTIVITAMIN) tablet Take 1 tablet by mouth daily.   Yes [provider]  tamsulosin (FLOMAX) 0.4 MG CAPS capsule Take 0.4 mg by mouth daily. 07/11/21  Yes [provider]  VASCEPA 1 g capsule Take 2 g by mouth 2 (two) times daily. 08/08/21  Yes [provider]  vitamin B-12 (CYANOCOBALAMIN) 500 MCG tablet Take 500 mcg by mouth daily.   Yes [provider]  Vitamin D3 (VITAMIN D) 25 MCG tablet Take 5,000 Units by mouth daily.   Yes [provider]  cloNIDine (CATAPRES) 0.2 MG tablet Take 1 tablet (0.2 mg total) by mouth 3 (three) times daily. Patient not taking: Reported on 08/17/2021 06/14/15   Claretta Fraise, MD  irbesartan (AVAPRO) 300 MG tablet Take 1 tablet (300 mg total) by mouth daily. For Blood pressure Patient not taking: Reported  on 08/17/2021 03/09/15   Claretta Fraise, MD    Current Facility-Administered Medications  Medication Dose Route Frequency Provider Last Rate Last Admin   calcium-vitamin D (OSCAL WITH D) 500-5 MG-MCG per tablet 1 tablet  1 tablet Oral Q breakfast Adefeso, Oladapo, DO       morphine (PF) 2 MG/ML injection 2 mg  2 mg Intravenous Q4H PRN Adefeso, Oladapo, DO   2 mg at 08/18/21 0153   ondansetron (ZOFRAN)  injection 4 mg  4 mg Intravenous Q6H PRN Adefeso, Oladapo, DO       pantoprazole (PROTONIX) injection 40 mg  40 mg Intravenous Q24H Adefeso, Oladapo, DO   40 mg at 08/18/21 0106    Allergies as of 08/17/2021   (No Known Allergies)    Past Medical History:  Diagnosis Date   CVA (cerebral vascular accident) ([place])    Dementia in other diseases classified elsewhere, severe, without behavioral disturbance, psychotic disturbance, mood disturbance, and anxiety    Hemiplegia, unspecified affecting right dominant side ([place])    Hyperlipidemia    Hypertension    Stroke ([place])    Vitamin D deficiency     Past Surgical History:  Procedure Laterality Date   HERNIA REPAIR      Family History  Problem Relation Age of Onset   Hypertension Mother    Stroke Mother    Hypertension Father    Hypertension Sister     Social History   Socioeconomic History   Marital status: Divorced    Spouse name: Not on file   Number of children: Not on file   Years of education: Not on file   Highest education level: Not on file  Occupational History   Not on file  Tobacco Use   Smoking status: Every Day   Smokeless tobacco: Not on file  Substance and Sexual Activity   Alcohol use: No   Drug use: No   Sexual activity: Not on file  Other Topics Concern   Not on file  Social History Narrative   Not on file   Social Determinants of Health   Financial Resource Strain: Not on file  Food Insecurity: Not on file  Transportation Needs: Not on file  Physical Activity: Not on file  Stress: Not on file  Social Connections: Not on file  Intimate Partner Violence: Not on file     Review of System:   UNRELIABLE  General: Negative for anorexia, weight loss, fever, chills, fatigue, weakness. Eyes: Negative for vision changes.  ENT: Negative for hoarseness, difficulty swallowing , nasal congestion. CV: Negative for chest pain, angina, palpitations, dyspnea on exertion, peripheral edema.   Respiratory: Negative for dyspnea at rest, dyspnea on exertion, cough, sputum, wheezing.  GI: See history of present illness. GU:  Negative for dysuria, hematuria, urinary incontinence, urinary frequency, nocturnal urination.  MS: Negative for joint pain, low back pain.  Derm: Negative for rash or itching.  Neuro: Negative for weakness, abnormal sensation, seizure, frequent headaches, memory loss, confusion.  Psych: Negative for anxiety, depression, suicidal ideation, hallucinations.  Endo: Negative for unusual weight change.  Heme: Negative for bruising or bleeding. Allergy: Negative for rash or hives.      Physical Examination:   Vital signs in last 24 hours: Temp:  [98.2 F (36.8 C)-98.9 F (37.2 C)] 98.2 F (36.8 C) (02/10 0853) Pulse Rate:  [64-98] 64 (02/10 0853) Resp:  [14-21] 17 (02/10 0853) BP: (90-161)/(61-82) 114/71 (02/10 0853) SpO2:  [91 %-98 %] 92 % (02/10 0853) Weight:  [  91.4 kg-94.3 kg] 91.4 kg (02/10 0352) Last BM Date: 08/17/21  General: Well-nourished, well-developed in no acute distress. Answers limited questions. Head: Normocephalic, atraumatic.   Eyes: Conjunctiva pink, no icterus. Mouth: Oropharyngeal mucosa moist and pink , no lesions erythema or exudate. Neck: Supple without thyromegaly, masses, or lymphadenopathy.  Lungs: Clear to auscultation bilaterally.  Heart: Regular rate and rhythm, no murmurs rubs or gallops.  Abdomen:  Nontender,  no hepatosplenomegaly or masses, no abdominal bruits or hernia, no rebound or guarding.  Mild distention. Hypoactive bowel sounds with mild tympany. Rectal: not performed Extremities: No lower extremity edema, clubbing, deformity.  Neuro: Alert and oriented to person, place.   Skin: Warm and dry, no rash or jaundice.   Psych: Alert and cooperative, normal mood and affect.        Intake/Output from previous day: 02/09 0701 - 02/10 0700 In: 1321.7 [P.O.:100; I.V.:1072.4; IV Piggyback:149.4] Out: 220 [Urine:200;  Emesis/NG output:20] Intake/Output this shift: No intake/output data recorded.  Lab Results:   CBC Recent Labs    08/17/21 2005 08/18/21 0459  WBC 7.0 7.2  HGB 13.4 11.2*  HCT 39.1 34.3*  MCV 92.7 92.7  PLT 177 146*   BMET Recent Labs    08/17/21 2005 08/18/21 0459  NA 131* 137  K 3.3* 3.7  CL 93* 102  CO2 29 30  GLUCOSE 147* 109*  BUN 36* 34*  CREATININE 1.84* 1.49*  CALCIUM 8.5* 8.2*   LFT Recent Labs    08/17/21 2005 08/18/21 0459  BILITOT 0.8 1.0  ALKPHOS 55 50  AST 16 14*  ALT 19 16  PROT 7.5 6.3*  ALBUMIN 3.8 3.3*    Lipase Recent Labs    08/17/21 2005  LIPASE 23    PT/INR Recent Labs    08/17/21 2005  LABPROT 13.6  INR 1.0      Imaging Studies:   CT ABDOMEN PELVIS W CONTRAST  Result Date: 08/17/2021 CLINICAL DATA:  Acute nonlocalized abdominal pain with distended abdomen and vomiting. EXAM: CT ABDOMEN AND PELVIS WITH CONTRAST TECHNIQUE: Multidetector CT imaging of the abdomen and pelvis was performed using the standard protocol following bolus administration of intravenous contrast. RADIATION DOSE REDUCTION: This exam was performed according to the departmental dose-optimization program which includes automated exposure control, adjustment of the mA and/or kV according to patient size and/or use of iterative reconstruction technique. CONTRAST:  11mL OMNIPAQUE IOHEXOL 300 MG/ML  SOLN COMPARISON:  None. FINDINGS: Lower chest: Atelectasis or infiltration in the lung bases. Mild cardiac enlargement with small pericardial effusion. Small esophageal hiatal hernia. Thick walled lower esophagus may represent reflux disease, esophagitis, or possibly an esophageal mass. Prominent paraesophageal lymph nodes. Hepatobiliary: No focal liver abnormality is seen. No gallstones, gallbladder wall thickening, or biliary dilatation. Pancreas: Unremarkable. No pancreatic ductal dilatation or surrounding inflammatory changes. Spleen: Normal in size without focal  abnormality. Adrenals/Urinary Tract: Adrenal glands are unremarkable. Kidneys are normal, without renal calculi, focal lesion, or hydronephrosis. Bladder is unremarkable. Stomach/Bowel: Mildly dilated fluid-filled small bowel with multiple air-fluid levels. The colon is decompressed. Terminal ileum also appears decompressed. Mild small bowel wall thickening. Vascular/Lymphatic: Extensive aortoiliac calcifications. No aneurysm. No significant lymphadenopathy. Reproductive: Mild prostate enlargement. Other: Small periumbilical hernia containing fat. No free air or free fluid. Musculoskeletal: Degenerative changes in the spine. IMPRESSION: 1. Dilated fluid-filled small bowel with air-fluid levels, bowel wall thickening, and decompressed terminal ileum. Changes are likely small bowel obstruction but could indicate enteritis. 2. Small esophageal hiatal hernia. Marked wall thickening of the  esophagus with paraesophageal lymphadenopathy. Changes could represent reflux or esophagitis but are worrisome for esophageal mass. The area is incompletely visualized. Suggest chest CT for further evaluation. 3. Infiltration or atelectasis in the lung bases. 4. Extensive aortic atherosclerosis. 5. Prostate gland is enlarged. Electronically Signed   By: Lucienne Capers M.D.   On: 08/17/2021 21:58   DG CHEST PORT 1 VIEW  Result Date: 08/18/2021 CLINICAL DATA:  Vomiting, NG tube placement EXAM: PORTABLE CHEST 1 VIEW COMPARISON:  02/19/2013 FINDINGS: Blunting of the left costophrenic angle, better evaluated on CT, reflecting pleural thickening with calcifications. Lungs are otherwise clear. No definite pleural effusion or pneumothorax. The heart is normal in size.  Thoracic aortic atherosclerosis. Enteric tube coursing into the stomach. IMPRESSION: No evidence of acute cardiopulmonary disease. Enteric tube coursing into the stomach. Electronically Signed   By: Julian Hy M.D.   On: 08/18/2021 02:51  [4 week]  Assessment:    61 y/o male with dementia, prior CVA, HTN, hyperlipidemia, dementia, BPH presenting to the ED from [facility] due to bloody emesis. Coffee-ground emesis witnessed by EMS per ED records. GI consulted for possible esophageal mass seen on CT.  Abnormal esophagus on CT: esophageal hiatal hernia with thick walled distal esophagus, prominent paraesophageal lymph nodes, concerning for possible esophageal mass although could be secondary to significant esophagitis. It is unknown if he has been having any swallowing issues, patient denies but history unreliable. Endoscopy evaluation needed to further evaluate.   Hematemesis:  noted prior to admission. No report of ongoing blood in emesis or NG. Ddx includes esophagitis, malignancy, PUD.    Possible small bowel obstruction: findings noted on CT. NGT placed but patient removed. Holding off on replacement until after endoscopy. Bowel sounds present with mild distention today. Could place NGT at time of endoscopy if deemed necessary but at risk for patient to removed again.  Plan:   EGD today.  I have discussed the risks, alternatives, benefits with regards to but not limited to the risk of reaction to medication, bleeding, infection, perforation and the patient is agreeable to proceed. Written consent to be obtained. Discussed with patient's son Ronda Chiarella at bedside. Continue pantoprazole IV BID.    LOS: 1 day   We would like to thank you for the opportunity to participate in the care of Warren Sullivan.  Laureen Ochs. Bernarda Caffey [facility] [phone] 2/10/202310:47 AM

## 2021-08-18 NOTE — Brief Op Note (Signed)
08/17/2021 - 08/18/2021  4:31 PM  PATIENT:  Warren Sullivan  61 y.o. male  PRE-OPERATIVE DIAGNOSIS:  possible esophageal mass on CT  POST-OPERATIVE DIAGNOSIS:  Gastritis , Duodenitis, Duodenal biopsies, Gastric Biopsies, Cardia Biopsy rule out malignancy,  Distal Esophageal biopsies rule out malignancy, Hiatel Hernia  35cm-38cm, Edema and excudate at 35cm to 32cm  PROCEDURE:  Procedure(s) with comments: ESOPHAGOGASTRODUODENOSCOPY (EGD) WITH PROPOFOL (N/A) BIOPSY - duodenal,gastric and distal esophagus biopsies   SURGEON:  Surgeon(s) and Role:    * Dolores Frame, MD - Primary  Patient underwent EGD under propofol sedation.  Tolerated procedure adequately.  There was evidence of Diffuse mucosal changes characterized by congestion and discoloration with some scant exudates were found in the distal esophagus.  Biopsies were taken with a cold forceps for histology.  A 3 cm hiatal hernia was present. Localized mucosal changes characterized by discoloration and depression were found in the cardia - this ascended to the GE junction.  Imaging was performed using white light and narrow band imaging to visualize the mucosa.  Biopsies were taken with a cold forceps for histology. A few localized small erosions with no stigmata of recent bleeding were found in the gastric antrum.  Biopsies were taken with a cold forceps for Helicobacter pylori testing. Localized severely erythematous mucosa without active bleeding was found in the duodenal bulb.  Biopsies were taken with a cold forceps for histology.  Upon careful inspection, no presence of hematin or active bleeding was present.  RECOMMENDATIONS: - Return patient to hospital ward for ongoing care.  - Advance diet as tolerated.  - Await pathology results.  - Switch pantoprazole to 40 mg BID PO. - Schedule CT chest with IV contrast.  Katrinka Blazing, MD Gastroenterology and Hepatology [facility]

## 2021-08-18 NOTE — Op Note (Signed)
[facility] Patient Name: Warren Sullivan Procedure Date: 08/18/2021 3:31 PM MRN: [id number] Date of Birth: Sep 19, 1960 Attending MD: Katrinka Blazing ,  CSN: [id number] Age: 61 Admit Type: Inpatient Procedure:                Upper GI endoscopy Indications:              Coffee-ground emesis, Abnormal CT of the GI tract Providers:                Katrinka Blazing, [place] Scrape RN, RN,                            Ina Homes, Technician Referring MD:              Medicines:                Monitored Anesthesia Care Complications:            No immediate complications. Estimated Blood Loss:     Estimated blood loss: none. Procedure:                Pre-Anesthesia Assessment:                           - Prior to the procedure, a History and Physical                            was performed, and patient medications, allergies                            and sensitivities were reviewed. The patient's                            tolerance of previous anesthesia was reviewed.                           - The risks and benefits of the procedure and the                            sedation options and risks were discussed with the                            patient. All questions were answered and informed                            consent was obtained.                           - ASA Grade Assessment: III - A patient with severe                            systemic disease.                           After obtaining informed consent, the endoscope was                            passed under direct  vision. Throughout the                            procedure, the patient's blood pressure, pulse, and                            oxygen saturations were monitored continuously. The                            [id number]-H190 ([id number]) scope was introduced through the                            mouth, and advanced to the second part of duodenum.                            The upper GI endoscopy was  accomplished without                            difficulty. The patient tolerated the procedure                            well. Scope In: 3:52:49 PM Scope Out: 4:19:31 PM Total Procedure Duration: 0 hours 26 minutes 42 seconds  Findings:      Diffuse mucosal changes characterized by congestion and discoloration       with some scant exudates were found in the distal esophagus. Biopsies       were taken with a cold forceps for histology.      A 3 cm hiatal hernia was present.      Localized mucosal changes characterized by discoloration and depression       were found in the cardia - this ascended to the GE junction. Imaging was       performed using white light and narrow band imaging to visualize the       mucosa. Biopsies were taken with a cold forceps for histology.      A few localized small erosions with no stigmata of recent bleeding were       found in the gastric antrum. Biopsies were taken with a cold forceps for       Helicobacter pylori testing.      Localized severely erythematous mucosa without active bleeding was found       in the duodenal bulb. Biopsies were taken with a cold forceps for       histology. Upon careful inspection, no presence of hematin or active       bleeding was present. Impression:               - Congestion and discoloration with some scant                            exudates mucosa in the esophagus. Biopsied.                           - 3 cm hiatal hernia.                           - Discolored mucosa in the cardia. Biopsied.                           -  Erosive gastropathy with no stigmata of recent                            bleeding. Biopsied.                           - Erythematous duodenopathy. Biopsied. Moderate Sedation:      Per Anesthesia Care Recommendation:           - Return patient to hospital ward for ongoing care.                           - Advance diet as tolerated.                           - Await pathology results.                            - Switch pantoprazole to 40 mg BID PO.                           - Schedule CT chest with IV contrast. Procedure Code(s):        --- Professional ---                           [phone], Esophagogastroduodenoscopy, flexible,                            transoral; with biopsy, single or multiple Diagnosis Code(s):        --- Professional ---                           K44.9, Diaphragmatic hernia without obstruction or                            gangrene                           K31.89, Other diseases of stomach and duodenum                           K92.0, Hematemesis                           R93.3, Abnormal findings on diagnostic imaging of                            other parts of digestive tract CPT copyright 2019 American Medical Association. All rights reserved. The codes documented in this report are preliminary and upon coder review may  be revised to meet current compliance requirements. Katrinka Blazing, MD Katrinka Blazing,  08/18/2021 4:35:39 PM This report has been signed electronically. Number of Addenda: 0

## 2021-08-18 NOTE — Assessment & Plan Note (Signed)
Continue NG tube  Continue NPO at this time with plan to advance diet as tolerated Continue IV hydration Continue IV morphine 2 mg q.4h p.r.n. for moderate/severe pain Continue zofran p.r.n. for nausea/vomiting Consider surgical consult if abdominal pain continues to worsen

## 2021-08-18 NOTE — Assessment & Plan Note (Signed)
Continue home meds when patient resumes oral intake

## 2021-08-18 NOTE — Assessment & Plan Note (Signed)
CBG 147; this is possibly reactive Continue to monitor blood glucose level with morning labs

## 2021-08-18 NOTE — Assessment & Plan Note (Signed)
K+ 3.3, this will be replenished

## 2021-08-18 NOTE — Transfer of Care (Signed)
Immediate Anesthesia Transfer of Care Note  Patient: Warren KATICH  Procedure(s) Performed: ESOPHAGOGASTRODUODENOSCOPY (EGD) WITH PROPOFOL BIOPSY  Patient Location: PACU  Anesthesia Type:General  Level of Consciousness: awake  Airway & Oxygen Therapy: Patient Spontanous Breathing  Post-op Assessment: Report given to RN and Post -op Vital signs reviewed and stable  Post vital signs: Reviewed and stable  Last Vitals:  Vitals Value Taken Time  BP    Temp    Pulse    Resp    SpO2      Last Pain:  Vitals:   08/18/21 1551  TempSrc:   PainSc: 0-No pain         Complications: No notable events documented.

## 2021-08-18 NOTE — Assessment & Plan Note (Signed)
Continue IV hydration ?

## 2021-08-18 NOTE — Assessment & Plan Note (Signed)
-  Continue Zofran as needed

## 2021-08-18 NOTE — Assessment & Plan Note (Signed)
Continue IV hydration and continue to monitor sodium levels with morning labs

## 2021-08-18 NOTE — Assessment & Plan Note (Signed)
BUN/creatinine 36/1.84 (no recent labs for comparison) Continue IV hydration Renally adjust medications, avoid nephrotoxic agents/dehydration/hypotension

## 2021-08-18 NOTE — Anesthesia Postprocedure Evaluation (Signed)
Anesthesia Post Note  Patient: Warren Sullivan  Procedure(s) Performed: ESOPHAGOGASTRODUODENOSCOPY (EGD) WITH PROPOFOL BIOPSY  Patient location during evaluation: PACU Anesthesia Type: General Level of consciousness: awake Pain management: pain level controlled Vital Signs Assessment: post-procedure vital signs reviewed and stable Respiratory status: spontaneous breathing and respiratory function stable Cardiovascular status: blood pressure returned to baseline and stable Postop Assessment: no headache and no apparent nausea or vomiting Anesthetic complications: no Comments: Late entry   No notable events documented.   Last Vitals:  Vitals:   08/18/21 1718 08/18/21 1834  BP: 126/72 131/71  Pulse: (!) 48 62  Resp: 20 20  Temp: 36.9 C 36.8 C  SpO2: 100% 93%    Last Pain:  Vitals:   08/18/21 1834  TempSrc: Oral  PainSc:                  Louann Sjogren

## 2021-08-18 NOTE — Assessment & Plan Note (Signed)
Continue Os-Cal

## 2021-08-18 NOTE — Anesthesia Preprocedure Evaluation (Signed)
Anesthesia Evaluation  Patient identified by MRN, date of birth, ID band Patient awake    Reviewed: Allergy & Precautions, H&P , NPO status , Patient's Chart, lab work & pertinent test results, reviewed documented beta blocker date and time   Airway Mallampati: II  TM Distance: >3 FB Neck ROM: full    Dental no notable dental hx.    Pulmonary neg pulmonary ROS, Current Smoker,    Pulmonary exam normal breath sounds clear to auscultation       Cardiovascular Exercise Tolerance: Good hypertension, negative cardio ROS   Rhythm:regular Rate:Normal     Neuro/Psych PSYCHIATRIC DISORDERS Dementia CVA    GI/Hepatic Neg liver ROS, GERD  Medicated,  Endo/Other  negative endocrine ROS  Renal/GU ARFRenal disease  negative genitourinary   Musculoskeletal   Abdominal   Peds  Hematology negative hematology ROS (+)   Anesthesia Other Findings   Reproductive/Obstetrics negative OB ROS                             Anesthesia Physical Anesthesia Plan  ASA: 4 and emergent  Anesthesia Plan: General   Post-op Pain Management:    Induction:   PONV Risk Score and Plan: Propofol infusion  Airway Management Planned:   Additional Equipment:   Intra-op Plan:   Post-operative Plan:   Informed Consent: I have reviewed the patients History and Physical, chart, labs and discussed the procedure including the risks, benefits and alternatives for the proposed anesthesia with the patient or authorized representative who has indicated his/her understanding and acceptance.     Dental Advisory Given  Plan Discussed with: CRNA  Anesthesia Plan Comments:         Anesthesia Quick Evaluation

## 2021-08-18 NOTE — Assessment & Plan Note (Signed)
CT abdomen and pelvis with contrast was suggestive of esophageal mass Consider chest CT for further evaluation after improvement in creatinine

## 2021-08-18 NOTE — Progress Notes (Signed)
PROGRESS NOTE     Aleksa Catterton, is a 61 y.o. 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 Esophageal Mass on CT AP    -Assessment and Plan:  1) intractable emesis with coffee Ground Emesis--?? ?? Esophageal Mass on CT AP S/p EGD on 08/18/21--pathology pending -Continue PPI -Patient did not tolerate NG tube, -Okay to try liquid diet -Continue IV fluids until oral intake is more reliable -As needed antiemetics -GI consult appreciated -Get CT chest with contrast after hydration to improve renal function  2)AKI----acute kidney injury -suspect dehydration related in the setting of poor oral intake and recurrent emesis -Hydrate IV and orally as tolerated renally adjust medications, avoid nephrotoxic agents / dehydration  / hypotension  3)Vascular dementia with behavioral disturbance--- move patient closer to nursing station -Psychologist, educational for safety -Lorazepam as needed  4) hyponatremia--due to dehydration in the setting of poor oral intake and emesis, hydrate orally and IV - 5) possible SBO--- related to #1 above -Discussed with general surgeon Dr. Lovell Sheehan -GI input appreciated  6)H/o Strokes--- aspirin and Lipitor on hold given #1 above   Disposition/Need for in-Hospital Stay- patient unable to be discharged at this time due to --- intractable emesis with coffee-ground emesis requiring further work-up including EGD, -Possible discharge to SNF when bowel function returns and patient is tolerating oral intake  Status is: Inpatient    Disposition: The patient is from: SNF              Anticipated d/c is to: SNF              Anticipated d/c date is: 2 days              Patient currently is not medically stable to d/c. Barriers: Not Clinically Stable-   Code Status :  -  Code Status: Full Code   Family Communication: Discussed with patient's son Scientist, product/process development on bedside, patient daughter-in-law, and patient's sister Ms. Price  DVT Prophylaxis  :   - SCDs   SCDs Start: 08/17/21 2352   Lab Results  Component Value Date   PLT 146 (L) 08/18/2021    Inpatient Medications  Scheduled Meds:  calcium-vitamin D  1 tablet Oral Q breakfast   pantoprazole (PROTONIX) IV  40 mg Intravenous Q12H   Continuous Infusions:  dextrose 5 % and 0.45% NaCl 125 mL/hr at 08/18/21 1748   PRN Meds:.haloperidol lactate, morphine injection, ondansetron (ZOFRAN) IV   Anti-infectives (From admission, onward)    None         Subjective: Haruto Demaria today has no fevers, No chest pain,   - Episodes of confusion and agitation, -Pulled out NG tube Twice -Patient's son and daughter-in-law at bedside, questions answered   Objective: Vitals:   08/18/21 1418 08/18/21 1627 08/18/21 1718 08/18/21 1834  BP: 127/78 99/66 126/72 131/71  Pulse: 64 67 (!) 48 62  Resp: 18 13 20 20   Temp: 98.1 F (36.7 C) 98.4 F (36.9 C) 98.4 F (36.9 C) 98.2 F (36.8 C)  TempSrc: Oral  Oral Oral  SpO2: 94% 96% 100% 93%  Weight:      Height:        Intake/Output Summary (Last 24 hours) at 08/18/2021 1839 Last data filed at 08/18/2021 1800 Gross per 24 hour  Intake 1746.39 ml  Output 220 ml  Net 1526.39 ml   Filed Weights   08/17/21 1939 08/18/21 0352  Weight: 94.3 kg 91.4 kg    Physical Exam  Gen:- Awake Alert, in no acute distress HEENT:- [place].AT, No sclera icterus Neck-Supple Neck,No JVD,.  Lungs-  CTAB , fair symmetrical air movement CV- S1, S2 normal, regular  Abd-  +ve B.Sounds, Abd Soft, No tenderness,    Extremity/Skin:-  No  edema, pedal pulses present  Psych-forgetful, cognitive or memory deficits, disorientation confusion with occasional agitation  neuro-generalized weakness, no new focal deficits, no tremors  Data Reviewed: I have personally reviewed following labs and imaging studies  CBC: Recent Labs  Lab 08/17/21 2005 08/18/21 0459  WBC 7.0 7.2  NEUTROABS 5.4  --   HGB 13.4 11.2*  HCT 39.1 34.3*  MCV 92.7 92.7  PLT 177 146*   Basic Metabolic Panel: Recent Labs  Lab 08/17/21 2005 08/18/21 0459  NA 131* 137  K 3.3* 3.7  CL 93* 102  CO2 29 30  GLUCOSE 147* 109*  BUN 36* 34*  CREATININE 1.84* 1.49*  CALCIUM 8.5* 8.2*   GFR: Estimated Creatinine Clearance: 55.8 mL/min (A) (by C-G formula based on SCr of 1.49 mg/dL (H)). Liver Function Tests: Recent Labs  Lab 08/17/21 2005 08/18/21 0459  AST 16 14*  ALT 19 16  ALKPHOS 55 50  BILITOT 0.8 1.0  PROT 7.5 6.3*  ALBUMIN 3.8 3.3*   Cardiac Enzymes: No results for input(s): CKTOTAL, CKMB, CKMBINDEX, TROPONINI in the last 168 hours. BNP (last 3 results) No results for input(s): PROBNP in the last 8760 hours. HbA1C: No results for input(s): HGBA1C in the last 72 hours. Sepsis Labs: @LABRCNTIP (procalcitonin:4,lacticidven:4) ) Recent Results (from the past 240 hour(s))  Resp Panel by RT-PCR (Flu A&B, Covid) Nasopharyngeal Swab     Status: None   Collection Time: 08/17/21 11:14 PM   Specimen: Nasopharyngeal Swab; Nasopharyngeal(NP) swabs in vial transport medium  Result Value Ref Range Status   SARS Coronavirus 2 by RT PCR NEGATIVE NEGATIVE Final    Comment: (NOTE) SARS-CoV-2 target nucleic acids are NOT DETECTED.  The SARS-CoV-2 RNA is generally detectable in upper respiratory specimens during the acute phase of infection. The lowest concentration of SARS-CoV-2 viral copies this assay can detect is 138 copies/mL. A negative result does not preclude SARS-Cov-2 infection and should not be used as the sole basis for treatment  or other patient management decisions. A negative result may occur with  improper specimen collection/handling, submission of specimen other than nasopharyngeal swab, presence of viral mutation(s) within the areas targeted by this assay, and inadequate number of viral copies(<138 copies/mL). A negative result must be combined with clinical observations, patient history, and epidemiological information. The expected result is Negative.  Fact Sheet for Patients:  BloggerCourse.com  Fact Sheet for Healthcare Providers:  SeriousBroker.it  This test is no t yet approved or cleared by the [place] FDA and  has been authorized for detection and/or diagnosis of SARS-CoV-2 by FDA under an Emergency Use Authorization (EUA). This EUA will remain  in effect (meaning this test can be used) for the duration of the COVID-19 declaration under Section 564(b)(1) of the Act, 21 U.S.C.section 360bbb-3(b)(1), unless the authorization is terminated  or revoked sooner.       Influenza A by PCR NEGATIVE NEGATIVE Final   Influenza B by PCR NEGATIVE NEGATIVE Final    Comment: (NOTE) The Xpert Xpress SARS-CoV-2/FLU/RSV plus assay is intended as an aid in the diagnosis of influenza from Nasopharyngeal swab specimens and should not be used as a sole basis for treatment. Nasal washings and aspirates are unacceptable for Xpert Xpress SARS-CoV-2/FLU/RSV testing.  Fact Sheet for Patients: BloggerCourse.com  Fact Sheet for Healthcare Providers: SeriousBroker.it  This test is not yet approved or cleared by the [place] FDA and has been authorized for detection and/or diagnosis of SARS-CoV-2 by FDA under an Emergency Use Authorization (EUA). This EUA will remain in effect (meaning this test can be used) for the duration of the COVID-19 declaration under Section 564(b)(1) of the Act, 21 U.S.C. section  360bbb-3(b)(1), unless the authorization is terminated or revoked.  Performed at [facility], [address], [place], [place] [address]       Radiology Studies: CT ABDOMEN PELVIS W CONTRAST  Result Date: 08/17/2021 CLINICAL DATA:  Acute nonlocalized abdominal pain with distended abdomen and vomiting. EXAM: CT ABDOMEN AND PELVIS WITH CONTRAST TECHNIQUE: Multidetector CT imaging of the abdomen and pelvis was performed using the standard protocol following bolus administration of intravenous contrast. RADIATION DOSE REDUCTION: This exam was performed according to the departmental dose-optimization program which includes automated exposure control, adjustment of the mA and/or kV according to patient size and/or use of iterative reconstruction technique. CONTRAST:  64mL OMNIPAQUE IOHEXOL 300 MG/ML  SOLN COMPARISON:  None. FINDINGS: Lower chest: Atelectasis or infiltration in the lung bases. Mild cardiac enlargement with small pericardial effusion. Small esophageal hiatal hernia. Thick walled lower esophagus may represent reflux disease, esophagitis, or possibly an esophageal mass. Prominent paraesophageal lymph nodes. Hepatobiliary: No focal liver abnormality is seen. No gallstones, gallbladder wall thickening, or biliary dilatation. Pancreas: Unremarkable. No pancreatic ductal dilatation or surrounding inflammatory changes. Spleen: Normal in size without focal abnormality. Adrenals/Urinary Tract: Adrenal glands are unremarkable. Kidneys are normal, without renal calculi, focal lesion, or hydronephrosis. Bladder is unremarkable. Stomach/Bowel: Mildly dilated fluid-filled small bowel with multiple air-fluid levels. The colon is decompressed. Terminal ileum also appears decompressed. Mild small bowel wall thickening. Vascular/Lymphatic: Extensive aortoiliac calcifications. No aneurysm. No significant lymphadenopathy. Reproductive: Mild prostate enlargement. Other: Small periumbilical hernia containing fat. No  free air or free fluid. Musculoskeletal: Degenerative changes in the spine. IMPRESSION: 1. Dilated fluid-filled small bowel with air-fluid levels, bowel wall thickening, and decompressed terminal ileum. Changes are likely small bowel obstruction but could indicate enteritis. 2. Small esophageal hiatal hernia. Marked wall thickening of the esophagus with paraesophageal lymphadenopathy. Changes could represent reflux or esophagitis but are worrisome for esophageal mass. The area is incompletely visualized. Suggest chest CT for further evaluation. 3. Infiltration or atelectasis in the lung bases. 4. Extensive aortic atherosclerosis. 5. Prostate gland is enlarged. Electronically Signed   By: Burman Nieves M.D.   On: 08/17/2021 21:58   DG CHEST PORT 1 VIEW  Result Date: 08/18/2021 CLINICAL DATA:  Vomiting, NG tube placement EXAM: PORTABLE CHEST 1 VIEW COMPARISON:  02/19/2013 FINDINGS: Blunting of the left costophrenic angle, better evaluated on CT, reflecting pleural thickening with calcifications. Lungs are otherwise clear. No definite pleural effusion or pneumothorax. The heart is normal in size.  Thoracic aortic atherosclerosis. Enteric tube coursing into the stomach. IMPRESSION: No evidence of acute cardiopulmonary disease. Enteric tube coursing into the stomach. Electronically Signed   By: Roselie Awkward.D.  On: 08/18/2021 02:51     Scheduled Meds:  calcium-vitamin D  1 tablet Oral Q breakfast   pantoprazole (PROTONIX) IV  40 mg Intravenous Q12H   Continuous Infusions:  dextrose 5 % and 0.45% NaCl 125 mL/hr at 08/18/21 1748     LOS: 1 day    Shon Haleourage Abimelec Grochowski M.D on 08/18/2021 at 6:39 PM  Go to [email or web address] - for contact info  [facility] - Office  [phone]  If 7PM-7AM, please contact night-coverage [email or web address] Password [email or web address] 08/18/2021, 6:39 PM

## 2021-08-18 NOTE — Progress Notes (Signed)
Patient off the floor for EGD.

## 2021-08-18 NOTE — Progress Notes (Signed)
°  Transition of Care [facility]) Screening Note   Patient Details  Name: Warren Sullivan Date of Birth: 1960/07/23   [facility] of [facility]) CM/SW Contact:    Boneta Lucks, RN Phone Number: 08/18/2021, 12:58 PM    Transition of Care Department [facility]) has reviewed patient and no TOC needs have been identified at this time. We will continue to monitor patient advancement through interdisciplinary progression rounds. If new patient transition needs arise, please place a TOC consult.

## 2021-08-18 NOTE — Assessment & Plan Note (Signed)
Continue Protonix °

## 2021-08-18 NOTE — Progress Notes (Signed)
Patient removed his NG tube, pulled off male wick. Sister no longer at bedside. Bed alarm gong off, writer ran down hall as he was attempting to get out of bed.

## 2021-08-18 NOTE — Assessment & Plan Note (Signed)
BP currently controlled Continue home meds when patient resumes oral intake

## 2021-08-18 NOTE — Progress Notes (Signed)
Patient back on unit, pretty confused, but calm. Have assisted him back to bed 3 times, haldol given for agitation

## 2021-08-18 NOTE — Progress Notes (Signed)
12 Fr NG tube placed at 0230. Portable Xray to confirm placement at 0245. NG tube pulled back to 24 in (61 cm). 40 ml Gastric content removed with verification, placed on LIS.

## 2021-08-18 NOTE — Assessment & Plan Note (Signed)
Continue home meds when patient resumes oral intake °

## 2021-08-19 ENCOUNTER — Encounter (HOSPITAL_COMMUNITY): Payer: Self-pay | Admitting: Radiology

## 2021-08-19 ENCOUNTER — Inpatient Hospital Stay (HOSPITAL_COMMUNITY): Payer: Medicare Other

## 2021-08-19 DIAGNOSIS — K56609 Unspecified intestinal obstruction, unspecified as to partial versus complete obstruction: Secondary | ICD-10-CM | POA: Diagnosis not present

## 2021-08-19 LAB — BASIC METABOLIC PANEL
Anion gap: 5 (ref 5–15)
BUN: 26 mg/dL — ABNORMAL HIGH (ref 8–23)
CO2: 30 mmol/L (ref 22–32)
Calcium: 8.1 mg/dL — ABNORMAL LOW (ref 8.9–10.3)
Chloride: 101 mmol/L (ref 98–111)
Creatinine, Ser: 1.06 mg/dL (ref 0.61–1.24)
GFR, Estimated: >60 mL/min (ref >60)
Glucose, Bld: 113 mg/dL — ABNORMAL HIGH (ref 70–99)
Potassium: 3.5 mmol/L (ref 3.5–5.1)
Sodium: 136 mmol/L (ref 135–145)

## 2021-08-19 LAB — URINE CULTURE

## 2021-08-19 LAB — CBC
HCT: 31.8 % — ABNORMAL LOW (ref 39.0–52.0)
Hemoglobin: 10.8 g/dL — ABNORMAL LOW (ref 13.0–17.0)
MCH: 31.7 pg (ref 26.0–34.0)
MCHC: 34 g/dL (ref 30.0–36.0)
MCV: 93.3 fL (ref 80.0–100.0)
Platelets: 141 10*3/uL — ABNORMAL LOW (ref 150–400)
RBC: 3.41 MIL/uL — ABNORMAL LOW (ref 4.22–5.81)
RDW: 12.5 % (ref 11.5–15.5)
WBC: 6.5 10*3/uL (ref 4.0–10.5)
nRBC: 0 % (ref 0.0–0.2)

## 2021-08-19 MED ORDER — IOHEXOL 300 MG/ML  SOLN
100.0000 mL | Freq: Once | INTRAMUSCULAR | Status: AC | PRN
  Administered 2021-08-19: 75 mL via INTRAVENOUS

## 2021-08-19 MED ORDER — NICOTINE 14 MG/24HR TD PT24
14.0000 mg | MEDICATED_PATCH | Freq: Every day | TRANSDERMAL | Status: DC
  Administered 2021-08-19 – 2021-08-20 (×2): 14 mg via TRANSDERMAL
  Filled 2021-08-19 (×2): qty 1

## 2021-08-19 NOTE — Progress Notes (Signed)
PROGRESS NOTE     Warren Sullivan, is a 61 y.o. male, DOB - 03/04/1961, GDJ:[id number]  Admit date - 08/17/2021   Admitting Physician Frankey Shown, DO  Outpatient Primary MD for the patient is Mechele Claude, MD  LOS - 2  Chief Complaint  Patient presents with   Emesis        Brief Narrative:  61 y.o. male reformed alcoholic with medical history significant of prior stroke with Vascular dementia, hypertension, hyperlipidemia,  BPH , tobacco abuse admitted from [facility] SNF with coffee ground emesis and found to have ?? Esophageal Mass on CT AP    -Assessment and Plan:  1)Intractable emesis with coffee Ground Emesis--?? ?? Esophageal Mass on CT AP S/p EGD on 08/18/21--pathology pending -Continue PPI -Patient did not tolerate NG tube, -Okay to try liquid diet -Continue IV fluids until oral intake is more reliable -As needed antiemetics -GI consult appreciated - CT chest with contrast pending -Trial of liquid diet  2)AKI----acute kidney injury -suspect dehydration related in the setting of poor oral intake and recurrent emesis -Hydrate IV and orally as tolerated -AKI resolved with hydration renally adjust medications, avoid nephrotoxic agents / dehydration  / hypotension  3)Vascular Dementia with behavioral disturbance--- move patient closer to nursing station -Lorazepam as needed  4)HypoNatremia--due to dehydration in the setting of poor oral intake and emesis, hydrate orally and IV -Sodium normalized with hydration - 5)Possible SBO--- related to #1 above -Discussed with general surgeon Dr. Lovell Sheehan -GI input appreciated -Trial of liquid diet  6)H/o Strokes--- aspirin and Lipitor on hold given #1 above  7) acute anemia--- Hgb down to 10.8 from a baseline of 13.4 on admission -, Suspect due to acute blood loss in the setting of coffee-ground emesis compounded by hemodilution Monitor closely and transfuse as clinically indicated  Disposition/Need for in-Hospital  Stay- patient unable to be discharged at this time due to --- intractable emesis with coffee-ground emesis requiring further work-up including EGD, -Possible discharge to SNF when bowel function returns and patient is tolerating oral intake  Status is: Inpatient   Disposition: The patient is from: SNF              Anticipated d/c is to: SNF              Anticipated d/c date is: 2 days              Patient currently is not medically stable to d/c. Barriers: Not Clinically Stable-   Code Status :  -  Code Status: Full Code   Family Communication: Discussed with patient's son Scientist, product/process development on bedside, patient daughter-in-law, and patient's sister Ms. Price  DVT Prophylaxis  :   - SCDs   SCDs Start: 08/17/21 2352   Lab Results  Component Value Date   PLT 141 (L) 08/19/2021    Inpatient Medications  Scheduled Meds:  calcium-vitamin D  1 tablet Oral Q breakfast   nicotine  14 mg Transdermal Daily   pantoprazole (PROTONIX) IV  40 mg Intravenous Q12H   Continuous Infusions:  dextrose 5 % and 0.45% NaCl 125 mL/hr at 08/19/21 1112   PRN Meds:.haloperidol lactate, labetalol, LORazepam, morphine injection, ondansetron (ZOFRAN) IV   Anti-infectives (From admission, onward)    None       Subjective: Warren Sullivan today has no fevers, No chest pain,   - Less confused, more cooperative No further emesis   Objective: Vitals:   08/18/21 1834 08/18/21 2155 08/19/21 0646 08/19/21 0922  BP: 131/71  126/68 121/67   Pulse: 62 63 74 63  Resp: 20 20 20 16   Temp: 98.2 F (36.8 C) 98.1 F (36.7 C) 98.5 F (36.9 C)   TempSrc: Oral Oral Oral   SpO2: 93% 95% 96% 99%  Weight:      Height:        Intake/Output Summary (Last 24 hours) at 08/19/2021 1148 Last data filed at 08/19/2021 0813 Gross per 24 hour  Intake 2016.34 ml  Output 200 ml  Net 1816.34 ml   Filed Weights   08/17/21 1939 08/18/21 0352  Weight: 94.3 kg 91.4 kg   Physical Exam  Gen:- Awake Alert, in no acute  distress HEENT:- [place].AT, No sclera icterus Neck-Supple Neck,No JVD,.  Lungs-  CTAB , fair symmetrical air movement CV- S1, S2 normal, regular  Abd-  +ve B.Sounds, Abd Soft, No tenderness,    Extremity/Skin:- No  edema, pedal pulses present  Psych- cognitive and memory deficits noted, less disoriented neuro-generalized weakness, no new focal deficits, no tremors  Data Reviewed: I have personally reviewed following labs and imaging studies  CBC: Recent Labs  Lab 08/17/21 2005 08/18/21 0459 08/19/21 0212  WBC 7.0 7.2 6.5  NEUTROABS 5.4  --   --   HGB 13.4 11.2* 10.8*  HCT 39.1 34.3* 31.8*  MCV 92.7 92.7 93.3  PLT 177 146* 141*   Basic Metabolic Panel: Recent Labs  Lab 08/17/21 2005 08/18/21 0459 08/19/21 0212  NA 131* 137 136  K 3.3* 3.7 3.5  CL 93* 102 101  CO2 29 30 30   GLUCOSE 147* 109* 113*  BUN 36* 34* 26*  CREATININE 1.84* 1.49* 1.06  CALCIUM 8.5* 8.2* 8.1*   GFR: Estimated Creatinine Clearance: 78.5 mL/min (by C-G formula based on SCr of 1.06 mg/dL). Liver Function Tests: Recent Labs  Lab 08/17/21 2005 08/18/21 0459  AST 16 14*  ALT 19 16  ALKPHOS 55 50  BILITOT 0.8 1.0  PROT 7.5 6.3*  ALBUMIN 3.8 3.3*   Cardiac Enzymes: No results for input(s): CKTOTAL, CKMB, CKMBINDEX, TROPONINI in the last 168 hours. BNP (last 3 results) No results for input(s): PROBNP in the last 8760 hours. HbA1C: No results for input(s): HGBA1C in the last 72 hours. Sepsis Labs: @LABRCNTIP (procalcitonin:4,lacticidven:4) ) Recent Results (from the past 240 hour(s))  Resp Panel by RT-PCR (Flu A&B, Covid) Nasopharyngeal Swab     Status: None   Collection Time: 08/17/21 11:14 PM   Specimen: Nasopharyngeal Swab; Nasopharyngeal(NP) swabs in vial transport medium  Result Value Ref Range Status   SARS Coronavirus 2 by RT PCR NEGATIVE NEGATIVE Final    Comment: (NOTE) SARS-CoV-2 target nucleic acids are NOT DETECTED.  The SARS-CoV-2 RNA is generally detectable in upper  respiratory specimens during the acute phase of infection. The lowest concentration of SARS-CoV-2 viral copies this assay can detect is 138 copies/mL. A negative result does not preclude SARS-Cov-2 infection and should not be used as the sole basis for treatment or other patient management decisions. A negative result may occur with  improper specimen collection/handling, submission of specimen other than nasopharyngeal swab, presence of viral mutation(s) within the areas targeted by this assay, and inadequate number of viral copies(<138 copies/mL). A negative result must be combined with clinical observations, patient history, and epidemiological information. The expected result is Negative.  Fact Sheet for Patients:  10/16/21  Fact Sheet for Healthcare Providers:   This test is no t yet approved or cleared by the 10/15/21 and  has been authorized  for detection and/or diagnosis of SARS-CoV-2 by FDA under an Emergency Use Authorization (EUA). This EUA will remain  in effect (meaning this test can be used) for the duration of the COVID-19 declaration under Section 564(b)(1) of the Act, 21 U.S.C.section 360bbb-3(b)(1), unless the authorization is terminated  or revoked sooner.       Influenza A by PCR NEGATIVE NEGATIVE Final   Influenza B by PCR NEGATIVE NEGATIVE Final    Comment: (NOTE) The Xpert Xpress SARS-CoV-2/FLU/RSV plus assay is intended as an aid in the diagnosis of influenza from Nasopharyngeal swab specimens and should not be used as a sole basis for treatment. Nasal washings and aspirates are unacceptable for Xpert Xpress SARS-CoV-2/FLU/RSV testing.  Fact Sheet for Patients: BloggerCourse.com  Fact Sheet for Healthcare Providers: SeriousBroker.it  This test is not yet approved or cleared by the [place] FDA and has been  authorized for detection and/or diagnosis of SARS-CoV-2 by FDA under an Emergency Use Authorization (EUA). This EUA will remain in effect (meaning this test can be used) for the duration of the COVID-19 declaration under Section 564(b)(1) of the Act, 21 U.S.C. section 360bbb-3(b)(1), unless the authorization is terminated or revoked.  Performed at [facility], [address] [place], [place] [address]   Urine Culture     Status: Abnormal   Collection Time: 08/18/21  6:30 AM   Specimen: Urine, Clean Catch  Result Value Ref Range Status   Specimen Description   Final    URINE, CLEAN CATCH Performed at [facility], [address], [place], [place] [address]    Special Requests   Final    NONE Performed at [facility], [address] [place], [place] [address]    Culture MULTIPLE SPECIES PRESENT, SUGGEST RECOLLECTION (A)  Final   Report Status 08/19/2021 FINAL  Final     Radiology Studies: CT ABDOMEN PELVIS W CONTRAST  Result Date: 08/17/2021 CLINICAL DATA:  Acute nonlocalized abdominal pain with distended abdomen and vomiting. EXAM: CT ABDOMEN AND PELVIS WITH CONTRAST TECHNIQUE: Multidetector CT imaging of the abdomen and pelvis was performed using the standard protocol following bolus administration of intravenous contrast. RADIATION DOSE REDUCTION: This exam was performed according to the departmental dose-optimization program which includes automated exposure control, adjustment of the mA and/or kV according to patient size and/or use of iterative reconstruction technique. CONTRAST:  6mL OMNIPAQUE IOHEXOL 300 MG/ML  SOLN COMPARISON:  None. FINDINGS: Lower chest: Atelectasis or infiltration in the lung bases. Mild cardiac enlargement with small pericardial effusion. Small esophageal hiatal hernia. Thick walled lower esophagus may represent reflux disease, esophagitis, or possibly an esophageal mass. Prominent paraesophageal lymph nodes. Hepatobiliary: No focal liver abnormality is seen.  No gallstones, gallbladder wall thickening, or biliary dilatation. Pancreas: Unremarkable. No pancreatic ductal dilatation or surrounding inflammatory changes. Spleen: Normal in size without focal abnormality. Adrenals/Urinary Tract: Adrenal glands are unremarkable. Kidneys are normal, without renal calculi, focal lesion, or hydronephrosis. Bladder is unremarkable. Stomach/Bowel: Mildly dilated fluid-filled small bowel with multiple air-fluid levels. The colon is decompressed. Terminal ileum also appears decompressed. Mild small bowel wall thickening. Vascular/Lymphatic: Extensive aortoiliac calcifications. No aneurysm. No significant lymphadenopathy. Reproductive: Mild prostate enlargement. Other: Small periumbilical hernia containing fat. No free air or free fluid. Musculoskeletal: Degenerative changes in the spine. IMPRESSION: 1. Dilated fluid-filled small bowel with air-fluid levels, bowel wall thickening, and decompressed terminal ileum. Changes are likely small bowel obstruction but could indicate enteritis. 2. Small esophageal hiatal hernia. Marked wall thickening of the esophagus with paraesophageal lymphadenopathy. Changes  could represent reflux or esophagitis but are worrisome for esophageal mass. The area is incompletely visualized. Suggest chest CT for further evaluation. 3. Infiltration or atelectasis in the lung bases. 4. Extensive aortic atherosclerosis. 5. Prostate gland is enlarged. Electronically Signed   By: Burman NievesWilliam  Stevens M.D.   On: 08/17/2021 21:58   DG CHEST PORT 1 VIEW  Result Date: 08/18/2021 CLINICAL DATA:  Vomiting, NG tube placement EXAM: PORTABLE CHEST 1 VIEW COMPARISON:  02/19/2013 FINDINGS: Blunting of the left costophrenic angle, better evaluated on CT, reflecting pleural thickening with calcifications. Lungs are otherwise clear. No definite pleural effusion or pneumothorax. The heart is normal in size.  Thoracic aortic atherosclerosis. Enteric tube coursing into the stomach.  IMPRESSION: No evidence of acute cardiopulmonary disease. Enteric tube coursing into the stomach. Electronically Signed   By: Charline BillsSriyesh  Krishnan M.D.   On: 08/18/2021 02:51    Scheduled Meds:  calcium-vitamin D  1 tablet Oral Q breakfast   nicotine  14 mg Transdermal Daily   pantoprazole (PROTONIX) IV  40 mg Intravenous Q12H   Continuous Infusions:  dextrose 5 % and 0.45% NaCl 125 mL/hr at 08/19/21 1112    LOS: 2 days   Shon Haleourage Franchot Pollitt M.D on 08/19/2021 at 11:48 AM  Go to [email or web address] - for contact info  [facility] - Office  [phone]  If 7PM-7AM, please contact night-coverage [email or web address] Password [email or web address] 08/19/2021, 11:48 AM

## 2021-08-20 DIAGNOSIS — K56609 Unspecified intestinal obstruction, unspecified as to partial versus complete obstruction: Secondary | ICD-10-CM | POA: Diagnosis not present

## 2021-08-20 LAB — CBC
HCT: 33.4 % — ABNORMAL LOW (ref 39.0–52.0)
Hemoglobin: 11.6 g/dL — ABNORMAL LOW (ref 13.0–17.0)
MCH: 31.1 pg (ref 26.0–34.0)
MCHC: 34.7 g/dL (ref 30.0–36.0)
MCV: 89.5 fL (ref 80.0–100.0)
Platelets: 169 10*3/uL (ref 150–400)
RBC: 3.73 MIL/uL — ABNORMAL LOW (ref 4.22–5.81)
RDW: 12.1 % (ref 11.5–15.5)
WBC: 7.5 10*3/uL (ref 4.0–10.5)
nRBC: 0 % (ref 0.0–0.2)

## 2021-08-20 MED ORDER — ATORVASTATIN CALCIUM 20 MG PO TABS: 20.0000 mg | ORAL_TABLET | Freq: Every day | ORAL | 3 refills | Status: DC

## 2021-08-20 MED ORDER — NICOTINE 14 MG/24HR TD PT24: 14.0000 mg | MEDICATED_PATCH | Freq: Every day | TRANSDERMAL | 0 refills | Status: DC

## 2021-08-20 MED ORDER — ASPIRIN EC 81 MG PO TBEC: 81.0000 mg | DELAYED_RELEASE_TABLET | Freq: Every day | ORAL | 11 refills | Status: DC

## 2021-08-20 MED ORDER — CLONIDINE HCL 0.1 MG PO TABS: 0.2000 mg | ORAL_TABLET | Freq: Two times a day (BID) | ORAL | 3 refills | Status: AC

## 2021-08-20 MED ORDER — PANTOPRAZOLE SODIUM 40 MG PO TBEC: 40.0000 mg | DELAYED_RELEASE_TABLET | Freq: Two times a day (BID) | ORAL | 3 refills | Status: DC

## 2021-08-20 NOTE — Progress Notes (Signed)
Report has been called to [facility]  

## 2021-08-20 NOTE — Progress Notes (Signed)
Results of pathology are pending to determine the possibility of malignancy of the esophagus or GE junction. We will follow this as outpatient as he has been tolerating oral intake adequately. Given the complexity of his anatomy, if the biopsies come back normal, we may consider repeating his endoscopy with the use of a transparent cap. I will reach the family members with the results of the pathology.

## 2021-08-20 NOTE — Progress Notes (Signed)
Patient's IV infiltrated. IV removed. MD Courage notified. MD stated to leave IV out until we hear from GI.

## 2021-08-20 NOTE — NC FL2 (Signed)
[place] MEDICAID FL2 LEVEL OF CARE SCREENING TOOL     IDENTIFICATION  Patient Name: Warren Sullivan Birthdate: 30-Apr-1961 Sex: male Admission Date (Current Location): 08/17/2021  [facility] and [place] Number:  [employer] and Address:  [employer],  [address], [place] [address]      Provider Number: [id number]  Attending Physician Name and Address:  Shon Hale, MD  Relative Name and Phone Number:       Current Level of Care: Hospital Recommended Level of Care: Skilled Nursing Facility Prior Approval Number:    Date Approved/Denied:   PASRR Number:    Discharge Plan: SNF    Current Diagnoses: Patient Active Problem List   Diagnosis Date Noted   Hypokalemia 08/18/2021   Hyponatremia 08/18/2021   AKI (acute kidney injury) (HCC) 08/18/2021   Hypocalcemia 08/18/2021   Hyperglycemia 08/18/2021   Vomiting 08/18/2021   GERD (gastroesophageal reflux disease) 08/18/2021   Esophageal mass 08/18/2021   Dehydration 08/18/2021   Hyperlipidemia, unspecified 08/18/2021   Dementia without behavioral disturbance (HCC) 08/18/2021   Small bowel obstruction (HCC) 08/17/2021   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 10/19/2015   Tobacco dependence 06/21/2015   Essential hypertension 06/14/2015    Orientation RESPIRATION BLADDER Height & Weight     Time, Self, Situation, Place  Normal Continent, External catheter Weight: 201 lb 8 oz (91.4 kg) Height:  5' 11.65" (182 cm)  BEHAVIORAL SYMPTOMS/MOOD NEUROLOGICAL BOWEL NUTRITION STATUS      Continent Diet (See discharge summary)  AMBULATORY STATUS COMMUNICATION OF NEEDS Skin   Extensive Assist Verbally Normal                       Personal Care Assistance Level of Assistance  Bathing, Feeding, Dressing Bathing Assistance: Limited assistance Feeding assistance: Limited assistance Dressing Assistance: Limited assistance     Functional Limitations Info  Sight, Hearing, Speech  Sight Info: Adequate Hearing Info: Adequate Speech Info: Adequate    SPECIAL CARE FACTORS FREQUENCY  PT (By licensed PT), OT (By licensed OT)     PT Frequency: 5x weekly OT Frequency: 5x weekly            Contractures Contractures Info: Not present    Additional Factors Info  Allergies, Code Status Code Status Info: Full Allergies Info: None           Current Medications (08/20/2021):  This is the current hospital active medication list Current Facility-Administered Medications  Medication Dose Route Frequency Provider Last Rate Last Admin   calcium-vitamin D (OSCAL WITH D) 500-5 MG-MCG per tablet 1 tablet  1 tablet Oral Q breakfast Adefeso, Oladapo, DO   1 tablet at 08/20/21 0904   dextrose 5 %-0.45 % sodium chloride infusion   Intravenous Continuous Emokpae, Courage, MD   Stopped at 08/20/21 1019   haloperidol lactate (HALDOL) injection 5 mg  5 mg Intramuscular Q6H PRN Shon Hale, MD   5 mg at 08/18/21 1715   labetalol (NORMODYNE) injection 10 mg  10 mg Intravenous Q4H PRN Emokpae, Courage, MD       LORazepam (ATIVAN) injection 1 mg  1 mg Intravenous Q6H PRN Emokpae, Courage, MD   1 mg at 08/19/21 0302   morphine (PF) 2 MG/ML injection 2 mg  2 mg Intravenous Q4H PRN Adefeso, Oladapo, DO   2 mg at 08/18/21 0153   nicotine (NICODERM CQ - dosed in mg/24 hours) patch 14 mg  14 mg Transdermal Daily Emokpae, Courage,  MD   14 mg at 08/20/21 0941   ondansetron (ZOFRAN) injection 4 mg  4 mg Intravenous Q6H PRN Adefeso, Oladapo, DO       pantoprazole (PROTONIX) injection 40 mg  40 mg Intravenous Q12H Tiffany Kocher, PA-C   40 mg at 08/20/21 7342     Discharge Medications: Please see discharge summary for a list of discharge medications.  Relevant Imaging Results:  Relevant Lab Results:   Additional Information SSN: [id number]  Inis Sizer, LCSW

## 2021-08-20 NOTE — Discharge Summary (Signed)
Warren Sullivan, is a 61 y.o. male  DOB 09-Dec-1960  MRN [id number].  Admission date:  08/17/2021  Admitting Physician  Bernadette Hoit, DO  Discharge Date:  08/20/2021   Primary MD  Claretta Fraise, MD  Recommendations for primary care physician for things to follow:   1)Follow up with Dr. Jenetta Downer-- in 1 week for results of your biopsies --address [address], [place] [id number] Number [phone]   2)Avoid ibuprofen/Advil/Aleve/Motrin/Goody Powders/Naproxen/BC powders/Meloxicam/Diclofenac/Indomethacin and other Nonsteroidal anti-inflammatory medications as these will make you more likely to bleed and can cause stomach ulcers, can also cause Kidney problems.   3)Repeat CBC and CMP Blood Test on Friday 08/25/21  4)Boost or Ensure Nutritional supplements advised--- 1 can/bottle twice daily  Admission Diagnosis  Small bowel obstruction ([place]) [K56.609] AKI (acute kidney injury) ([place]) [N17.9]   Discharge Diagnosis  Small bowel obstruction ([place]) [K56.609] AKI (acute kidney injury) ([place]) [N17.9]    Principal Problem:   Small bowel obstruction (HCC) Active Problems:   Essential hypertension   Hypokalemia   Hyponatremia   AKI (acute kidney injury) ([place])   Hypocalcemia   Hyperglycemia   Vomiting   GERD (gastroesophageal reflux disease)   Esophageal mass   Dehydration   Hyperlipidemia, unspecified   Dementia without behavioral disturbance (HCC)      Past Medical History:  Diagnosis Date   CVA (cerebral vascular accident) ([place])    Dementia in other diseases classified elsewhere, severe, without behavioral disturbance, psychotic disturbance, mood disturbance, and anxiety    Hemiplegia, unspecified affecting right dominant side ([place])    Hyperlipidemia    Hypertension    Stroke ([place])    Vitamin D deficiency     Past Surgical History:  Procedure Laterality Date    HERNIA REPAIR      HPI  from the history and physical done on the day of admission:     HPI: Warren Sullivan is a 61 y.o. male with medical history significant of prior stroke, hypertension, hyperlipidemia, dementia, BPH who presents to the emergency department from [facility] due to complaint of vomiting this afternoon with presumed blood in vomitus.  Patient has a history of dementia, he endorsed vomiting, but denies any blood in vomiting, however, some coffee-ground emesis was witnessed by EMS per ED medical record.  He denies headache, shortness of breath, chest pain, constipation (states last bowel movement was this afternoon around 4 PM).   ED course: In the emergency department, he was hemodynamically stable.  Work-up in the ED showed normal CBC, hyponatremia, hypokalemia, BUN/creatinine 36/1.84 (no recent labs for comparison) and hyperglycemia.  Calcium 8.5, lipase 23, lactic acid 1.5, FOBT negative. CT abdomen and pelvis with contrast was suggestive of small bowel obstruction with suspicion for enteritis.  Small esophageal hiatal hernia. Marked wall thickening of the esophagus with paraesophageal lymphadenopathy. Changes could represent reflux or esophagitis but are worrisome for esophageal mass. IV hydration was provided, NG tube was indicated to be given in the ED.  Hospitalist was asked to admit  patient for further evaluation and management.     Review of Systems: As mentioned in the history of present illness. All other systems reviewed and are negative.    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 Esophageal Mass on CT AP     -Assessment and Plan:   1)Intractable emesis with coffee Ground Emesis--?? ?? Esophageal Mass on CT AP S/p  EGD on 08/18/21--pathology pending -Continue PPI -As needed antiemetics -GI consult appreciated - CT chest with contrast suggestive of esophageal mass -Tolerating soft diet -Discussed with Dr. Jenetta Downer okay to discharge home with outpatient follow-up with GI for pathology results- -If pathology positive patient will need referral to oncology -If pathology negative patient may need repeat EGD with biopsies again   2)AKI----acute kidney injury -suspect dehydration related in the setting of poor oral intake and recurrent emesis -AKI resolved with hydration renally adjust medications, avoid nephrotoxic agents / dehydration  / hypotension   3)Vascular Dementia with behavioral disturbance--- stable,  4)HypoNatremia--due to dehydration in the setting of poor oral intake and emesis- -Sodium normalized with hydration - 5)Possible SBO--- related to #1 above -Discussed with general surgeon Dr. Arnoldo Morale -GI input appreciated -Eating and drinking well without emesis, having BMs -Clinically and radiologically does not appear to be obstructed at this time   6)H/o Strokes--- aspirin and Lipitor for secondary stroke prophylaxis  7) acute anemia--- Hgb stable at 11.6 from a baseline of 13.4 on admission -, Suspect due to acute blood loss in the setting of coffee-ground emesis compounded by hemodilution -Continue PPI  Disposition/--back to SNF rehab   Disposition: The patient is from: SNF              Anticipated d/c is to: SNF             Code Status :  -  Code Status: Full Code    Family Communication: Discussed with patient's son Social research officer, government on bedside, patient daughter-in-law, and patient's sister Ms. Price   Discharge Condition: stable  Follow UP   Follow-up Information     Montez Morita, Quillian Quince, MD. Schedule an appointment as soon as possible for a visit in 1 week(s).   Specialty: Gastroenterology Contact information: [address] [place]  [address] [phone]                  Consults obtained - Gi/Gen surg  Diet and Activity recommendation:  As advised  Discharge Instructions    Discharge Instructions     Call MD for:  difficulty breathing, headache or visual disturbances   Complete by: As directed    Call MD for:  persistant dizziness or light-headedness   Complete by: As directed    Call MD for:  persistant nausea and vomiting   Complete by: As directed    Call MD for:  temperature >100.4   Complete by: As directed    Diet - low sodium heart healthy   Complete by: As directed    -Soft Diet advised  -Boost or Ensure Nutritional supplements advised--- 1 can/bottle twice daily   Discharge instructions   Complete by: As directed    1)Follow up with Dr. Jenetta Downer-- in 1 week for results of your biopsies --address [address], [address],   [place] [id number] Number [phone]   2)Avoid ibuprofen/Advil/Aleve/Motrin/Goody Powders/Naproxen/BC powders/Meloxicam/Diclofenac/Indomethacin and other Nonsteroidal anti-inflammatory medications as these will make you more likely to bleed and can cause stomach ulcers, can also cause Kidney problems.   3)Repeat CBC and CMP Blood Test on Friday 08/25/21  4)Boost or Ensure Nutritional supplements advised--- 1 can/bottle twice daily   Increase activity slowly   Complete by: As directed          Discharge Medications     Allergies as of 08/20/2021   No Known Allergies      Medication List     STOP taking these medications    hydrochlorothiazide 25 MG tablet Commonly known as: HYDRODIURIL   irbesartan 300 MG tablet Commonly known as: AVAPRO       TAKE these medications    acetaminophen 650 MG CR tablet Commonly known as: TYLENOL Take 650 mg by mouth every 8 (eight) hours as needed for pain.   aspirin EC 81 MG tablet Take 1 tablet (81 mg total) by mouth daily with breakfast. Swallow whole. What changed: when to take this    atorvastatin 20 MG tablet Commonly known as: LIPITOR Take 1 tablet (20 mg total) by mouth daily.   Calcium-Vitamin D3 250-3.125 MG-MCG Tabs Take 1 tablet by mouth daily.   cloNIDine 0.1 MG tablet Commonly known as: CATAPRES Take 2 tablets (0.2 mg total) by mouth 2 (two) times daily. What changed:  medication strength when to take this   FLUoxetine 20 MG capsule Commonly known as: PROZAC Take 20 mg by mouth daily.   gabapentin 300 MG capsule Commonly known as: NEURONTIN Take 1 capsule (300 mg total) by mouth 3 (three) times daily. Start with one at bedtime for 1 week. Then two at bedtime for one week, then go to full dose.   losartan 100 MG tablet Commonly known as: COZAAR Take 100 mg by mouth daily.   memantine 10 MG tablet Commonly known as: NAMENDA Take 10 mg by mouth 2 (two) times daily.   multivitamin tablet Take 1 tablet by mouth daily.   nicotine 14 mg/24hr patch Commonly known as: NICODERM CQ - dosed in mg/24 hours Place 1 patch (14 mg total) onto the skin daily. Start taking on: August 21, 2021   pantoprazole 40 MG tablet Commonly known as: Protonix Take 1 tablet (40 mg total) by mouth 2 (two) times daily.   tamsulosin 0.4 MG Caps capsule Commonly known as: FLOMAX Take 0.4 mg by mouth daily.   Vascepa 1 g capsule Generic drug: icosapent Ethyl Take 2 g by mouth 2 (two) times daily.   vitamin B-12 500 MCG tablet Commonly known as: CYANOCOBALAMIN Take 500 mcg by mouth daily.   Vitamin D3 25 MCG tablet Commonly known as: Vitamin D Take 5,000 Units by mouth daily.        Major procedures and Radiology Reports - PLEASE review detailed and final reports for all details, in brief -   CT CHEST W CONTRAST  Result Date: 08/19/2021 CLINICAL DATA:  61 year old male with history of esophageal mass. EXAM: CT CHEST WITH CONTRAST TECHNIQUE: Multidetector CT imaging of the chest was performed during intravenous contrast administration. RADIATION DOSE  REDUCTION: This exam was performed according to the departmental dose-optimization program which includes automated exposure control, adjustment of the mA and/or kV according to patient size and/or use of iterative reconstruction technique. CONTRAST:  23mL OMNIPAQUE IOHEXOL 300 MG/ML  SOLN COMPARISON:  No priors. FINDINGS: Cardiovascular: Heart size is normal. There is no significant  pericardial fluid, thickening or pericardial calcification. There is aortic atherosclerosis, as well as atherosclerosis of the great vessels of the mediastinum and the coronary arteries, including calcified atherosclerotic plaque in the left main, left anterior descending, left circumflex and right coronary arteries. Aneurysmal dilatation of the ascending thoracic aorta (4.8 cm in diameter). Mediastinum/Nodes: Prominent borderline enlarged distal middle mediastinal lymph nodes measuring up to 11 mm in short axis (axial image 98 of series 2). These are adjacent to the patient's moderate-sized hiatal hernia. Immediately proximal to the hernia in the distal esophagus there is a mass-like area of soft tissue thickening measuring approximately 4.3 x 2.6 cm, concerning for potential neoplasm (axial image 98 of series 2). No axillary lymphadenopathy. Lungs/Pleura: Calcified pleural plaques in the left hemithorax. No right-sided calcified pleural plaques are noted. 7 x 5 mm (mean diameter of 6 mm) right upper lobe pulmonary nodule (axial image 54 of series 4). No other larger more suspicious appearing pulmonary nodules or masses are noted. No acute consolidative airspace disease. No pleural effusions. Chronic scarring in the base of the left lung, likely related to remote infection or trauma. Upper Abdomen: Aortic atherosclerosis. High attenuation material within the lumen of the gallbladder, new compared to the recent prior examination, presumably vicarious excretion of contrast material from the prior contrast enhanced CT examination.  Musculoskeletal: There are no aggressive appearing lytic or blastic lesions noted in the visualized portions of the skeleton. IMPRESSION: 1. Mass-like thickening of the distal esophagus just before the gastroesophageal junction concerning for potential neoplasm. Prominent borderline enlarged middle mediastinal lymph nodes are noted adjacent to this. Correlation with results of recent biopsy are recommended to exclude neoplasm. 2. 7 x 5 mm (mean diameter of 6 mm) right upper lobe pulmonary nodule. This is nonspecific. Attention on follow-up studies is recommended to ensure stability or regression, however, particularly in light of the potential esophageal neoplasm. 3. Aortic atherosclerosis, in addition to left main and three-vessel coronary artery disease. Please note that although the presence of coronary artery calcium documents the presence of coronary artery disease, the severity of this disease and any potential stenosis cannot be assessed on this non-gated CT examination. Assessment for potential risk factor modification, dietary therapy or pharmacologic therapy may be warranted, if clinically indicated. 4. There is also aneurysmal dilatation of the ascending thoracic aorta (4.8 cm in diameter). Ascending thoracic aortic aneurysm. Recommend semi-annual imaging followup by CTA or MRA and referral to cardiothoracic surgery if not already obtained. This recommendation follows 2010 ACCF/AHA/AATS/ACR/ASA/SCA/SCAI/SIR/STS/SVM Guidelines for the Diagnosis and Management of Patients With Thoracic Aortic Disease. Circulation. 2010; 121ML:[id number]. Aortic aneurysm NOS (ICD10-I71.9). Aortic Atherosclerosis (ICD10-I70.0) and aortic aneurysm NOS (ICD10-I71.9). Electronically Signed   By: Vinnie Langton M.D.   On: 08/19/2021 14:09   CT ABDOMEN PELVIS W CONTRAST  Result Date: 08/17/2021 CLINICAL DATA:  Acute nonlocalized abdominal pain with distended abdomen and vomiting. EXAM: CT ABDOMEN AND PELVIS WITH CONTRAST  TECHNIQUE: Multidetector CT imaging of the abdomen and pelvis was performed using the standard protocol following bolus administration of intravenous contrast. RADIATION DOSE REDUCTION: This exam was performed according to the departmental dose-optimization program which includes automated exposure control, adjustment of the mA and/or kV according to patient size and/or use of iterative reconstruction technique. CONTRAST:  35mL OMNIPAQUE IOHEXOL 300 MG/ML  SOLN COMPARISON:  None. FINDINGS: Lower chest: Atelectasis or infiltration in the lung bases. Mild cardiac enlargement with small pericardial effusion. Small esophageal hiatal hernia. Thick walled lower esophagus may represent reflux disease, esophagitis, or possibly  an esophageal mass. Prominent paraesophageal lymph nodes. Hepatobiliary: No focal liver abnormality is seen. No gallstones, gallbladder wall thickening, or biliary dilatation. Pancreas: Unremarkable. No pancreatic ductal dilatation or surrounding inflammatory changes. Spleen: Normal in size without focal abnormality. Adrenals/Urinary Tract: Adrenal glands are unremarkable. Kidneys are normal, without renal calculi, focal lesion, or hydronephrosis. Bladder is unremarkable. Stomach/Bowel: Mildly dilated fluid-filled small bowel with multiple air-fluid levels. The colon is decompressed. Terminal ileum also appears decompressed. Mild small bowel wall thickening. Vascular/Lymphatic: Extensive aortoiliac calcifications. No aneurysm. No significant lymphadenopathy. Reproductive: Mild prostate enlargement. Other: Small periumbilical hernia containing fat. No free air or free fluid. Musculoskeletal: Degenerative changes in the spine. IMPRESSION: 1. Dilated fluid-filled small bowel with air-fluid levels, bowel wall thickening, and decompressed terminal ileum. Changes are likely small bowel obstruction but could indicate enteritis. 2. Small esophageal hiatal hernia. Marked wall thickening of the esophagus with  paraesophageal lymphadenopathy. Changes could represent reflux or esophagitis but are worrisome for esophageal mass. The area is incompletely visualized. Suggest chest CT for further evaluation. 3. Infiltration or atelectasis in the lung bases. 4. Extensive aortic atherosclerosis. 5. Prostate gland is enlarged. Electronically Signed   By: Lucienne Capers M.D.   On: 08/17/2021 21:58   DG CHEST PORT 1 VIEW  Result Date: 08/18/2021 CLINICAL DATA:  Vomiting, NG tube placement EXAM: PORTABLE CHEST 1 VIEW COMPARISON:  02/19/2013 FINDINGS: Blunting of the left costophrenic angle, better evaluated on CT, reflecting pleural thickening with calcifications. Lungs are otherwise clear. No definite pleural effusion or pneumothorax. The heart is normal in size.  Thoracic aortic atherosclerosis. Enteric tube coursing into the stomach. IMPRESSION: No evidence of acute cardiopulmonary disease. Enteric tube coursing into the stomach. Electronically Signed   By: Julian Hy M.D.   On: 08/18/2021 02:51    Micro Results   Recent Results (from the past 240 hour(s))  Resp Panel by RT-PCR (Flu A&B, Covid) Nasopharyngeal Swab     Status: None   Collection Time: 08/17/21 11:14 PM   Specimen: Nasopharyngeal Swab; Nasopharyngeal(NP) swabs in vial transport medium  Result Value Ref Range Status   SARS Coronavirus 2 by RT PCR NEGATIVE NEGATIVE Final    Comment: (NOTE) SARS-CoV-2 target nucleic acids are NOT DETECTED.  The SARS-CoV-2 RNA is generally detectable in upper respiratory specimens during the acute phase of infection. The lowest concentration of SARS-CoV-2 viral copies this assay can detect is 138 copies/mL. A negative result does not preclude SARS-Cov-2 infection and should not be used as the sole basis for treatment or other patient management decisions. A negative result may occur with  improper specimen collection/handling, submission of specimen other than nasopharyngeal swab, presence of viral  mutation(s) within the areas targeted by this assay, and inadequate number of viral copies(<138 copies/mL). A negative result must be combined with clinical observations, patient history, and epidemiological information. The expected result is Negative.  Fact Sheet for Patients:  EntrepreneurPulse.com.au  Fact Sheet for Healthcare Providers:  IncredibleEmployment.be  This test is no t yet approved or cleared by the [place] FDA and  has been authorized for detection and/or diagnosis of SARS-CoV-2 by FDA under an Emergency Use Authorization (EUA). This EUA will remain  in effect (meaning this test can be used) for the duration of the COVID-19 declaration under Section 564(b)(1) of the Act, 21 U.S.C.section 360bbb-3(b)(1), unless the authorization is terminated  or revoked sooner.       Influenza A by PCR NEGATIVE NEGATIVE Final   Influenza B by PCR NEGATIVE NEGATIVE Final  Comment: (NOTE) The Xpert Xpress SARS-CoV-2/FLU/RSV plus assay is intended as an aid in the diagnosis of influenza from Nasopharyngeal swab specimens and should not be used as a sole basis for treatment. Nasal washings and aspirates are unacceptable for Xpert Xpress SARS-CoV-2/FLU/RSV testing.  Fact Sheet for Patients: EntrepreneurPulse.com.au  Fact Sheet for Healthcare Providers: IncredibleEmployment.be  This test is not yet approved or cleared by the [place] FDA and has been authorized for detection and/or diagnosis of SARS-CoV-2 by FDA under an Emergency Use Authorization (EUA). This EUA will remain in effect (meaning this test can be used) for the duration of the COVID-19 declaration under Section 564(b)(1) of the Act, 21 U.S.C. section 360bbb-3(b)(1), unless the authorization is terminated or revoked.  Performed at [facility], [address], [place], [place] [address]   Urine Culture     Status: Abnormal    Collection Time: 08/18/21  6:30 AM   Specimen: Urine, Clean Catch  Result Value Ref Range Status   Specimen Description   Final    URINE, CLEAN CATCH Performed at [facility], [address] [place], [place] [address]    Special Requests   Final    NONE Performed at [facility], [address], [place], [place] [address]    Culture MULTIPLE SPECIES PRESENT, SUGGEST RECOLLECTION (A)  Final   Report Status 08/19/2021 FINAL  Final   Today   Subjective    Warren Sullivan today has no new concerns No fever  Or chills  Eating and drinking well  No Nausea, Vomiting or Diarrhea Had BM, no abd pain Sister at bedside   Patient has been seen and examined prior to discharge   Objective   Blood pressure (!) 143/70, pulse 73, temperature 99.1 F (37.3 C), temperature source Oral, resp. rate 20, height 5' 11.65" (1.82 m), weight 91.4 kg, SpO2 96 %.   Intake/Output Summary (Last 24 hours) at 08/20/2021 1228 Last data filed at 08/20/2021 0915 Gross per 24 hour  Intake 240 ml  Output 1800 ml  Net -1560 ml    Exam Gen:- Awake Alert, in no acute distress HEENT:- [place].AT, No sclera icterus Neck-Supple Neck,No JVD,.  Lungs-  CTAB , fair symmetrical air movement CV- S1, S2 normal, regular  Abd-  +ve B.Sounds, Abd Soft, No tenderness,    Extremity/Skin:- No  edema, pedal pulses present  Psych- baseline cognitive and memory deficits noted, less disoriented  neuro-generalized weakness, no new focal deficits, no tremors   Data Review   CBC w Diff:  Lab Results  Component Value Date   WBC 7.5 08/20/2021   HGB 11.6 (L) 08/20/2021   HGB 13.8 10/19/2015   HCT 33.4 (L) 08/20/2021   HCT 41.0 10/19/2015   PLT 169 08/20/2021   PLT 125 (L) 10/19/2015   LYMPHOPCT 15 08/17/2021   MONOPCT 7 08/17/2021   EOSPCT 1 08/17/2021   BASOPCT 0 08/17/2021    CMP:  Lab Results  Component Value Date   NA 136 08/19/2021   NA 137 10/19/2015   K 3.5 08/19/2021   CL 101 08/19/2021   CO2 30  08/19/2021   BUN 26 (H) 08/19/2021   BUN 19 10/19/2015   CREATININE 1.06 08/19/2021   PROT 6.3 (L) 08/18/2021   PROT 7.3 10/19/2015   ALBUMIN 3.3 (L) 08/18/2021   ALBUMIN 4.0 10/19/2015   BILITOT 1.0 08/18/2021   BILITOT 0.3 10/19/2015   ALKPHOS 50 08/18/2021   AST 14 (L) 08/18/2021   ALT 16 08/18/2021  .  Total Discharge time is about 33 minutes  Roxan Hockey M.D on 08/20/2021 at 12:28 PM  Go to [email or web address] -  for contact info  [facility] - Office  [phone]

## 2021-08-20 NOTE — Progress Notes (Signed)
Warren Sullivan notified that patient has left for nursing home. Patient left via EMS. No distress noted at the time of discharge

## 2021-08-20 NOTE — Progress Notes (Addendum)
CSW spoke with Eunice Blase at [facility] who states the patient can return to the facility today. Patient will go to room C10, bed #1. The number to call for report is [phone]. CSW arranged for transportation to the facility via [facility].   RN aware of discharge plan.   Edwin Dada, MSW, LCSW Transitions of Care   Clinical Social Worker II [phone]

## 2021-08-20 NOTE — Discharge Instructions (Signed)
1)Follow up with Dr. Levon Hedger-- in 1 week for results of your biopsies --address [address], [place] [address],,VQMGQ Number [phone]   2)Avoid ibuprofen/Advil/Aleve/Motrin/Goody Powders/Naproxen/BC powders/Meloxicam/Diclofenac/Indomethacin and other Nonsteroidal anti-inflammatory medications as these will make you more likely to bleed and can cause stomach ulcers, can also cause Kidney problems.   3)Repeat CBC and CMP Blood Test on Friday 08/25/21  4)Boost or Ensure Nutritional supplements advised--- 1 can/bottle twice daily

## 2021-08-22 ENCOUNTER — Encounter (HOSPITAL_COMMUNITY): Payer: Self-pay | Admitting: Gastroenterology

## 2021-08-23 ENCOUNTER — Other Ambulatory Visit (INDEPENDENT_AMBULATORY_CARE_PROVIDER_SITE_OTHER): Payer: Self-pay

## 2021-08-23 ENCOUNTER — Other Ambulatory Visit (INDEPENDENT_AMBULATORY_CARE_PROVIDER_SITE_OTHER): Payer: Self-pay | Admitting: Gastroenterology

## 2021-08-23 DIAGNOSIS — K2980 Duodenitis without bleeding: Secondary | ICD-10-CM

## 2021-08-23 DIAGNOSIS — B9681 Helicobacter pylori [H. pylori] as the cause of diseases classified elsewhere: Secondary | ICD-10-CM

## 2021-08-23 LAB — SURGICAL PATHOLOGY

## 2021-08-23 MED ORDER — BISMUTH 262 MG PO CHEW: 2.0000 | CHEWABLE_TABLET | Freq: Four times a day (QID) | ORAL | 0 refills | Status: DC

## 2021-08-23 MED ORDER — TETRACYCLINE HCL 500 MG PO CAPS: 500.0000 mg | ORAL_CAPSULE | Freq: Four times a day (QID) | ORAL | 0 refills | Status: DC

## 2021-08-23 MED ORDER — METRONIDAZOLE 500 MG PO TABS: 500.0000 mg | ORAL_TABLET | Freq: Three times a day (TID) | ORAL | 0 refills | Status: DC

## 2021-08-24 ENCOUNTER — Encounter (INDEPENDENT_AMBULATORY_CARE_PROVIDER_SITE_OTHER): Payer: Self-pay

## 2021-08-28 ENCOUNTER — Emergency Department (HOSPITAL_COMMUNITY): Payer: Medicare Other

## 2021-08-28 ENCOUNTER — Inpatient Hospital Stay (HOSPITAL_COMMUNITY)
Admission: EM | Admit: 2021-08-28 | Discharge: 2021-08-31 | DRG: 871 | Disposition: A | Discharge status: Skilled Nursing Facility | Payer: Medicare Other | Source: Skilled Nursing Facility | Attending: Family Medicine | Admitting: Family Medicine

## 2021-08-28 ENCOUNTER — Other Ambulatory Visit: Payer: Self-pay

## 2021-08-28 ENCOUNTER — Encounter (HOSPITAL_COMMUNITY): Payer: Self-pay | Admitting: *Deleted

## 2021-08-28 DIAGNOSIS — R0600 Dyspnea, unspecified: Secondary | ICD-10-CM

## 2021-08-28 DIAGNOSIS — E559 Vitamin D deficiency, unspecified: Secondary | ICD-10-CM | POA: Diagnosis present

## 2021-08-28 DIAGNOSIS — N179 Acute kidney failure, unspecified: Secondary | ICD-10-CM | POA: Diagnosis present

## 2021-08-28 DIAGNOSIS — E785 Hyperlipidemia, unspecified: Secondary | ICD-10-CM | POA: Diagnosis present

## 2021-08-28 DIAGNOSIS — Z20822 Contact with and (suspected) exposure to covid-19: Secondary | ICD-10-CM | POA: Diagnosis present

## 2021-08-28 DIAGNOSIS — F17200 Nicotine dependence, unspecified, uncomplicated: Secondary | ICD-10-CM | POA: Diagnosis present

## 2021-08-28 DIAGNOSIS — W19XXXA Unspecified fall, initial encounter: Secondary | ICD-10-CM

## 2021-08-28 DIAGNOSIS — I10 Essential (primary) hypertension: Secondary | ICD-10-CM | POA: Diagnosis present

## 2021-08-28 DIAGNOSIS — F01518 Vascular dementia, unspecified severity, with other behavioral disturbance: Secondary | ICD-10-CM | POA: Diagnosis present

## 2021-08-28 DIAGNOSIS — Z823 Family history of stroke: Secondary | ICD-10-CM | POA: Diagnosis not present

## 2021-08-28 DIAGNOSIS — I69351 Hemiplegia and hemiparesis following cerebral infarction affecting right dominant side: Secondary | ICD-10-CM | POA: Diagnosis not present

## 2021-08-28 DIAGNOSIS — D649 Anemia, unspecified: Secondary | ICD-10-CM | POA: Diagnosis present

## 2021-08-28 DIAGNOSIS — Z8249 Family history of ischemic heart disease and other diseases of the circulatory system: Secondary | ICD-10-CM

## 2021-08-28 DIAGNOSIS — K219 Gastro-esophageal reflux disease without esophagitis: Secondary | ICD-10-CM | POA: Diagnosis present

## 2021-08-28 DIAGNOSIS — R6520 Severe sepsis without septic shock: Secondary | ICD-10-CM | POA: Diagnosis present

## 2021-08-28 DIAGNOSIS — A419 Sepsis, unspecified organism: Principal | ICD-10-CM | POA: Diagnosis present

## 2021-08-28 DIAGNOSIS — Y92009 Unspecified place in unspecified non-institutional (private) residence as the place of occurrence of the external cause: Secondary | ICD-10-CM

## 2021-08-28 DIAGNOSIS — F329 Major depressive disorder, single episode, unspecified: Secondary | ICD-10-CM | POA: Diagnosis present

## 2021-08-28 DIAGNOSIS — E860 Dehydration: Secondary | ICD-10-CM | POA: Diagnosis present

## 2021-08-28 DIAGNOSIS — J189 Pneumonia, unspecified organism: Secondary | ICD-10-CM

## 2021-08-28 DIAGNOSIS — B9681 Helicobacter pylori [H. pylori] as the cause of diseases classified elsewhere: Secondary | ICD-10-CM

## 2021-08-28 HISTORY — DX: Pneumonia, unspecified organism: J18.9

## 2021-08-28 HISTORY — DX: Major depressive disorder, single episode, unspecified: F32.9

## 2021-08-28 HISTORY — DX: Gastro-esophageal reflux disease without esophagitis: K21.9

## 2021-08-28 LAB — CBC WITH DIFFERENTIAL/PLATELET
Abs Immature Granulocytes: 0.75 10*3/uL — ABNORMAL HIGH (ref 0.00–0.07)
Basophils Absolute: 0.1 10*3/uL (ref 0.0–0.1)
Basophils Relative: 0 %
Eosinophils Absolute: 0 10*3/uL (ref 0.0–0.5)
Eosinophils Relative: 0 %
HCT: 31.2 % — ABNORMAL LOW (ref 39.0–52.0)
Hemoglobin: 10.1 g/dL — ABNORMAL LOW (ref 13.0–17.0)
Immature Granulocytes: 4 %
Lymphocytes Relative: 5 %
Lymphs Abs: 1.1 10*3/uL (ref 0.7–4.0)
MCH: 30.1 pg (ref 26.0–34.0)
MCHC: 32.4 g/dL (ref 30.0–36.0)
MCV: 93.1 fL (ref 80.0–100.0)
Monocytes Absolute: 0.7 10*3/uL (ref 0.1–1.0)
Monocytes Relative: 3 %
Neutro Abs: 18.5 10*3/uL — ABNORMAL HIGH (ref 1.7–7.7)
Neutrophils Relative %: 88 %
Platelets: 142 10*3/uL — ABNORMAL LOW (ref 150–400)
RBC: 3.35 MIL/uL — ABNORMAL LOW (ref 4.22–5.81)
RDW: 13.2 % (ref 11.5–15.5)
WBC: 21.1 10*3/uL — ABNORMAL HIGH (ref 4.0–10.5)
nRBC: 0 % (ref 0.0–0.2)

## 2021-08-28 LAB — RESP PANEL BY RT-PCR (FLU A&B, COVID) ARPGX2
Influenza A by PCR: NEGATIVE
Influenza B by PCR: NEGATIVE
SARS Coronavirus 2 by RT PCR: NEGATIVE

## 2021-08-28 LAB — BASIC METABOLIC PANEL
Anion gap: 7 (ref 5–15)
BUN: 37 mg/dL — ABNORMAL HIGH (ref 8–23)
CO2: 25 mmol/L (ref 22–32)
Calcium: 8 mg/dL — ABNORMAL LOW (ref 8.9–10.3)
Chloride: 103 mmol/L (ref 98–111)
Creatinine, Ser: 2.77 mg/dL — ABNORMAL HIGH (ref 0.61–1.24)
GFR, Estimated: 25 mL/min — ABNORMAL LOW (ref >60)
Glucose, Bld: 151 mg/dL — ABNORMAL HIGH (ref 70–99)
Potassium: 4.1 mmol/L (ref 3.5–5.1)
Sodium: 135 mmol/L (ref 135–145)

## 2021-08-28 LAB — POC OCCULT BLOOD, ED: Fecal Occult Bld: NEGATIVE

## 2021-08-28 LAB — LACTIC ACID, PLASMA: Lactic Acid, Venous: 2 mmol/L (ref 0.5–1.9)

## 2021-08-28 MED ORDER — SODIUM CHLORIDE 0.9 % IV SOLN: 2.0000 g | Freq: Two times a day (BID) | INTRAVENOUS | Status: DC

## 2021-08-28 MED ORDER — SODIUM CHLORIDE 0.9 % IV SOLN
2.0000 g | Freq: Once | INTRAVENOUS | Status: AC
  Administered 2021-08-28: 2 g via INTRAVENOUS
  Filled 2021-08-28: qty 2

## 2021-08-28 MED ORDER — SODIUM CHLORIDE 0.9 % IV SOLN: INTRAVENOUS | Status: AC

## 2021-08-28 MED ORDER — VANCOMYCIN HCL IN DEXTROSE 1-5 GM/200ML-% IV SOLN: 1000.0000 mg | Freq: Once | INTRAVENOUS | Status: DC

## 2021-08-28 MED ORDER — SODIUM CHLORIDE 0.9 % IV BOLUS
1000.0000 mL | Freq: Once | INTRAVENOUS | Status: AC
  Administered 2021-08-28: 1000 mL via INTRAVENOUS

## 2021-08-28 MED ORDER — SODIUM CHLORIDE 0.9 % IV BOLUS (SEPSIS)
1000.0000 mL | Freq: Once | INTRAVENOUS | Status: AC
  Administered 2021-08-28: 1000 mL via INTRAVENOUS

## 2021-08-28 MED ORDER — METRONIDAZOLE 500 MG/100ML IV SOLN
500.0000 mg | Freq: Once | INTRAVENOUS | Status: AC
  Administered 2021-08-28: 500 mg via INTRAVENOUS
  Filled 2021-08-28: qty 100

## 2021-08-28 MED ORDER — VANCOMYCIN HCL 2000 MG/400ML IV SOLN
2000.0000 mg | INTRAVENOUS | Status: DC
Start: 2021-08-28 — End: 2021-08-29
  Administered 2021-08-28: 2000 mg via INTRAVENOUS
  Filled 2021-08-28: qty 400

## 2021-08-28 NOTE — Sepsis Progress Note (Signed)
Following for sepsis monitoring ?

## 2021-08-28 NOTE — ED Notes (Signed)
To x-ray

## 2021-08-28 NOTE — ED Triage Notes (Signed)
Pt brought in by [facility] from [facility] with c/o fall onto his right side out of his wheelchair today. Pt denies any complaints.

## 2021-08-28 NOTE — ED Provider Notes (Signed)
[facility] EMERGENCY DEPARTMENT Provider Note   CSN: [id number] Arrival date & time: 08/28/21  1810     History  Chief Complaint  Patient presents with   [name]    Warren Sullivan is a 61 y.o. male with PMHx HTN, CVA with residual left sided deficits, dementia who presents to the ED today via EMS from [facility] for further evaluation s/2 fall.  Patient reports that he fell out of his chair today and landed on his left side.  He states that [facility] for further evaluation and to make sure he did not fracture his left shoulder.  Family at bedside corroborates the story.  Patient denies any head injury or loss of consciousness.  Family member states he is acting at baseline.  He has no physical complaints at this time.   The history is provided by the patient, medical records and a relative.      Home Medications Prior to Admission medications   Medication Sig Start Date End Date Taking? Authorizing Provider  acetaminophen (TYLENOL) 650 MG CR tablet Take 650 mg by mouth every 8 (eight) hours as needed for pain.    [provider]  aspirin EC 81 MG tablet Take 1 tablet (81 mg total) by mouth daily with breakfast. Swallow whole. 08/20/21   Roxan Hockey, MD  atorvastatin (LIPITOR) 20 MG tablet Take 1 tablet (20 mg total) by mouth daily. 08/20/21   Roxan Hockey, MD  Bismuth 262 MG CHEW Chew 2 each by mouth every 6 (six) hours. 08/23/21   Harvel Quale, MD  Calcium Carb-Cholecalciferol (CALCIUM-VITAMIN D3) 250-3.125 MG-MCG TABS Take 1 tablet by mouth daily.    [provider]  cloNIDine (CATAPRES) 0.1 MG tablet Take 2 tablets (0.2 mg total) by mouth 2 (two) times daily. 08/20/21   Roxan Hockey, MD  FLUoxetine (PROZAC) 20 MG capsule Take 20 mg by mouth daily. 08/01/21   [provider]  gabapentin (NEURONTIN) 300 MG capsule Take 1 capsule (300 mg total) by mouth 3 (three) times daily. Start with one at bedtime for 1 week. Then two at  bedtime for one week, then go to full dose. 10/19/15   Claretta Fraise, MD  losartan (COZAAR) 100 MG tablet Take 100 mg by mouth daily. 08/07/21   [provider]  memantine (NAMENDA) 10 MG tablet Take 10 mg by mouth 2 (two) times daily. 07/24/21   [provider]  metroNIDAZOLE (FLAGYL) 500 MG tablet Take 1 tablet (500 mg total) by mouth 3 (three) times daily for 14 days. 08/23/21 09/06/21  Harvel Quale, MD  Multiple Vitamin (MULTIVITAMIN) tablet Take 1 tablet by mouth daily.    [provider]  nicotine (NICODERM CQ - DOSED IN MG/24 HOURS) 14 mg/24hr patch Place 1 patch (14 mg total) onto the skin daily. 08/21/21   Roxan Hockey, MD  pantoprazole (PROTONIX) 40 MG tablet Take 1 tablet (40 mg total) by mouth 2 (two) times daily. 08/20/21 08/20/22  Roxan Hockey, MD  tamsulosin (FLOMAX) 0.4 MG CAPS capsule Take 0.4 mg by mouth daily. 07/11/21   [provider]  tetracycline (SUMYCIN) 500 MG capsule Take 1 capsule (500 mg total) by mouth 4 (four) times daily for 14 days. 08/23/21 09/06/21  Harvel Quale, MD  VASCEPA 1 g capsule Take 2 g by mouth 2 (two) times daily. 08/08/21   [provider]  vitamin B-12 (CYANOCOBALAMIN) 500 MCG tablet Take 500 mcg by mouth daily.    [provider]  Vitamin D3 (VITAMIN D) 25 MCG tablet Take 5,000 Units by mouth daily.    [provider]      Allergies    Patient has no known allergies.    Review of Systems   Review of Systems  Constitutional:  Negative for chills and fever.  Respiratory:  Negative for shortness of breath.   Cardiovascular:  Negative for chest pain.  Musculoskeletal:  Negative for arthralgias.  Skin:  Negative for wound.  Neurological:  Negative for syncope and headaches.  All other systems reviewed and are negative.  Physical Exam Updated Vital Signs BP 96/62    Pulse (!) 59    Temp 98.2 F (36.8 C) (Oral)    Resp 18    Ht 5' 11.65" (1.82 m)    Wt 91.4 kg     SpO2 91%    BMI 27.60 kg/m  Physical Exam Vitals and nursing note reviewed.  Constitutional:      Appearance: He is not ill-appearing or diaphoretic.  HENT:     Head: Normocephalic and atraumatic.  Eyes:     Conjunctiva/sclera: Conjunctivae normal.  Cardiovascular:     Rate and Rhythm: Normal rate and regular rhythm.  Pulmonary:     Effort: Pulmonary effort is normal.     Breath sounds: Normal breath sounds.  Abdominal:     Palpations: Abdomen is soft.     Tenderness: There is no abdominal tenderness.  Musculoskeletal:     Cervical back: Neck supple.     Comments: No obvious deformity appreciated to L shoulder. No swelling or ecchymosis. ROM intact to shoulder. No significant TTP. Strength 5/5 with grip strength. Sensation intact throughout. 2+ Radial pulse.   Skin:    General: Skin is warm and dry.  Neurological:     Mental Status: He is alert.    ED Results / Procedures / Treatments   Labs (all labs ordered are listed, but only abnormal results are displayed) Labs Reviewed  BASIC METABOLIC PANEL - Abnormal; Notable for the following components:      Result Value   Glucose, Bld 151 (*)    BUN 37 (*)    Creatinine, Ser 2.77 (*)    Calcium 8.0 (*)    GFR, Estimated 25 (*)    All other components within normal limits  CBC WITH DIFFERENTIAL/PLATELET - Abnormal; Notable for the following components:   WBC 21.1 (*)    RBC 3.35 (*)    Hemoglobin 10.1 (*)    HCT 31.2 (*)    Platelets 142 (*)    Neutro Abs 18.5 (*)    Abs Immature Granulocytes 0.75 (*)    All other components within normal limits  CULTURE, BLOOD (ROUTINE X 2)  CULTURE, BLOOD (ROUTINE X 2)  RESP PANEL BY RT-PCR (FLU A&B, COVID) [id number]  LACTIC ACID, PLASMA  LACTIC ACID, PLASMA  URINALYSIS, ROUTINE W REFLEX MICROSCOPIC  POC OCCULT BLOOD, ED    EKG None  Radiology DG Chest 2 View  Result Date: 08/28/2021 CLINICAL DATA:  [place] out of wheelchair EXAM: CHEST - 2 VIEW COMPARISON:  08/18/2021 FINDINGS:  Patchy airspace disease in the lower lungs. No pleural effusion. Stable cardiomediastinal silhouette with aortic atherosclerosis. IMPRESSION: Patchy lower lung airspace disease concerning for pneumonia. Radiographic follow-up to resolution is recommended. Electronically Signed   By: Donavan Foil M.D.   On: 08/28/2021 22:20   DG Shoulder Left  Result Date: 08/28/2021 CLINICAL DATA:  Fall today.  Rule out fracture. EXAM: LEFT SHOULDER - 2+  VIEW COMPARISON:  None. FINDINGS: Osseous alignment is normal. No fracture line or displaced fracture fragment is seen. No significant degenerative change. Soft tissues about the LEFT shoulder are unremarkable. IMPRESSION: Negative. Electronically Signed   By: Franki Cabot M.D.   On: 08/28/2021 21:22    Procedures .Critical Care Performed by: Eustaquio Maize, PA-C Authorized by: Eustaquio Maize, PA-C   Critical care provider statement:    Critical care time (minutes):  35   Critical care was necessary to treat or prevent imminent or life-threatening deterioration of the following conditions:  Sepsis   Critical care was time spent personally by me on the following activities:  Development of treatment plan with patient or surrogate, discussions with consultants, evaluation of patient's response to treatment, examination of patient, ordering and review of laboratory studies, ordering and review of radiographic studies, ordering and performing treatments and interventions, pulse oximetry, re-evaluation of patient's condition and review of old charts    Medications Ordered in ED Medications  0.9 %  sodium chloride infusion (has no administration in time range)  ceFEPIme (MAXIPIME) 2 g in sodium chloride 0.9 % 100 mL IVPB (2 g Intravenous New Bag/Given 08/28/21 2226)  metroNIDAZOLE (FLAGYL) IVPB 500 mg (has no administration in time range)  ceFEPIme (MAXIPIME) 2 g in sodium chloride 0.9 % 100 mL IVPB (has no administration in time range)  vancomycin (VANCOREADY)  IVPB 2000 mg/400 mL (has no administration in time range)  sodium chloride 0.9 % bolus 1,000 mL (1,000 mLs Intravenous New Bag/Given 08/28/21 2132)  sodium chloride 0.9 % bolus 1,000 mL (1,000 mLs Intravenous New Bag/Given 08/28/21 2218)    And  sodium chloride 0.9 % bolus 1,000 mL (1,000 mLs Intravenous New Bag/Given 08/28/21 2218)    ED Course/ Medical Decision Making/ A&P                           Medical Decision Making 61 year old male presents to the ED today from [facility] secondary to fall out of chair earlier today landing on his left shoulder.  Patient has no physical complaints at this time and denies head injury or loss of consciousness.  He and his family member both state that [facility] wanted him seen with concern for possible fracture of his left shoulder despite not having any pain.  On arrival to the ED patient's blood pressure is low at 90/59.  We will plan to repeat.  Remainder vitals unremarkable.  Resting comfortably in bed.  He has no trauma to the left shoulder.  No significant tenderness palpation.  He is neurovascularly intact.  We will plan for x-ray of further evaluation.  Patient again denies head injury and family reports he is acting at baseline.  Do not feel he needs CT head at this time.  Repeat blood pressure unchanged. Labs and EKG ordered at this time. 1L Fluid bolus provided for patient. Will continue to monitor BP.   BP unchanged after 1L fluid bolus. Workup returning concerning for infection. Sepsis protocol started at this time. U/A and CXR added to rule out infection. Again pt without physical complaints. Did have recent admission for SBO however per family has been eating and drinking normally at [place]. Pt without abd TTP on exam at this time.   CXR concerning for pneumonia. Empiric antibiotics running at this time and 30 CC fluid/kg bolus provided. Will admit at this time. Pt on 2L for comfort as O2 teetering at 90% on RA. Not on  oxygen at home.   Amount  and/or Complexity of Data Reviewed Labs: ordered.    Details: CBC with elevated leukocytosis 21,100 with left shift. Pt without fever at this time or tachycardia however in the setting of hypotension will expand to sepsis workup today. Hgb 10.1 (has dropped more than a gram in 8 days time). Will obtain FOBT.  FOBT negative  BMP with worsening creatinine today at 2.77 and BUN 37. Radiology: ordered.    Details: Xray of L shoulder negative  Xray of chest concerning for pneumonia. Abx ordered. Discussion of management or test interpretation with external provider(s): Discussed case with [facility] Dr. Clearence Ped who agrees to accept patient for admission  Risk Prescription drug management. Decision regarding hospitalization.  Critical Care Total time providing critical care: 30-74 minutes         Final Clinical Impression(s) / ED Diagnoses Final diagnoses:  Sepsis with acute renal failure without septic shock, due to unspecified organism, unspecified acute renal failure type [facility])  Community acquired pneumonia, unspecified laterality    Rx / DC Orders ED Discharge Orders     None         Eustaquio Maize, PA-C 08/28/21 2253    Milton Ferguson, MD 08/29/21 1557

## 2021-08-28 NOTE — ED Notes (Signed)
Patient started on 2L [place].  °

## 2021-08-28 NOTE — Progress Notes (Signed)
Pharmacy Antibiotic Note  Warren Sullivan is a 61 y.o. male admitted on 08/28/2021 with sepsis.  Pharmacy has been consulted for Vancomycin/Cefepime dosing.  Plan: Vancomycin 2 gram iv Q 48 hours (AUC of 529 using Scr of 2.77)  Cefepime 2 grams iv Q 12 hours  Follow up Scr, cultures, progress  Height: 5' 11.65" (182 cm) Weight: 91.4 kg (201 lb 8 oz) IBW/kg (Calculated) : 76.8  Temp (24hrs), Avg:98.2 F (36.8 C), Min:98.2 F (36.8 C), Max:98.2 F (36.8 C)  Recent Labs  Lab 08/28/21 2130  WBC 21.1*  CREATININE 2.77*    Estimated Creatinine Clearance: 30 mL/min (A) (by C-G formula based on SCr of 2.77 mg/dL (H)).    No Known Allergies   Thank you for allowing pharmacy to be a part of this patients care.  Elwin Sleight 08/28/2021 10:16 PM

## 2021-08-29 ENCOUNTER — Other Ambulatory Visit: Payer: Self-pay

## 2021-08-29 DIAGNOSIS — N179 Acute kidney failure, unspecified: Secondary | ICD-10-CM | POA: Diagnosis not present

## 2021-08-29 DIAGNOSIS — W19XXXA Unspecified fall, initial encounter: Secondary | ICD-10-CM | POA: Diagnosis not present

## 2021-08-29 DIAGNOSIS — I10 Essential (primary) hypertension: Secondary | ICD-10-CM | POA: Diagnosis not present

## 2021-08-29 DIAGNOSIS — Y92009 Unspecified place in unspecified non-institutional (private) residence as the place of occurrence of the external cause: Secondary | ICD-10-CM

## 2021-08-29 DIAGNOSIS — J189 Pneumonia, unspecified organism: Secondary | ICD-10-CM | POA: Diagnosis not present

## 2021-08-29 DIAGNOSIS — K219 Gastro-esophageal reflux disease without esophagitis: Secondary | ICD-10-CM

## 2021-08-29 DIAGNOSIS — R6520 Severe sepsis without septic shock: Secondary | ICD-10-CM

## 2021-08-29 DIAGNOSIS — A419 Sepsis, unspecified organism: Principal | ICD-10-CM

## 2021-08-29 HISTORY — DX: Unspecified fall, initial encounter: W19.XXXA

## 2021-08-29 LAB — COMPREHENSIVE METABOLIC PANEL
ALT: 13 U/L (ref 0–44)
AST: 22 U/L (ref 15–41)
Albumin: 2.6 g/dL — ABNORMAL LOW (ref 3.5–5.0)
Alkaline Phosphatase: 46 U/L (ref 38–126)
Anion gap: 6 (ref 5–15)
BUN: 35 mg/dL — ABNORMAL HIGH (ref 8–23)
CO2: 23 mmol/L (ref 22–32)
Calcium: 7.4 mg/dL — ABNORMAL LOW (ref 8.9–10.3)
Chloride: 108 mmol/L (ref 98–111)
Creatinine, Ser: 2.12 mg/dL — ABNORMAL HIGH (ref 0.61–1.24)
GFR, Estimated: 35 mL/min — ABNORMAL LOW (ref >60)
Glucose, Bld: 117 mg/dL — ABNORMAL HIGH (ref 70–99)
Potassium: 3.8 mmol/L (ref 3.5–5.1)
Sodium: 137 mmol/L (ref 135–145)
Total Bilirubin: 0.7 mg/dL (ref 0.3–1.2)
Total Protein: 5.5 g/dL — ABNORMAL LOW (ref 6.5–8.1)

## 2021-08-29 LAB — MAGNESIUM: Magnesium: 1.6 mg/dL — ABNORMAL LOW (ref 1.7–2.4)

## 2021-08-29 LAB — CBC WITH DIFFERENTIAL/PLATELET
Abs Immature Granulocytes: 0.12 10*3/uL — ABNORMAL HIGH (ref 0.00–0.07)
Basophils Absolute: 0.1 10*3/uL (ref 0.0–0.1)
Basophils Relative: 0 %
Eosinophils Absolute: 0.2 10*3/uL (ref 0.0–0.5)
Eosinophils Relative: 1 %
HCT: 28.8 % — ABNORMAL LOW (ref 39.0–52.0)
Hemoglobin: 9 g/dL — ABNORMAL LOW (ref 13.0–17.0)
Immature Granulocytes: 1 %
Lymphocytes Relative: 11 %
Lymphs Abs: 2.1 10*3/uL (ref 0.7–4.0)
MCH: 29.9 pg (ref 26.0–34.0)
MCHC: 31.3 g/dL (ref 30.0–36.0)
MCV: 95.7 fL (ref 80.0–100.0)
Monocytes Absolute: 0.8 10*3/uL (ref 0.1–1.0)
Monocytes Relative: 4 %
Neutro Abs: 15.8 10*3/uL — ABNORMAL HIGH (ref 1.7–7.7)
Neutrophils Relative %: 83 %
Platelets: 145 10*3/uL — ABNORMAL LOW (ref 150–400)
RBC: 3.01 MIL/uL — ABNORMAL LOW (ref 4.22–5.81)
RDW: 13.3 % (ref 11.5–15.5)
WBC: 19 10*3/uL — ABNORMAL HIGH (ref 4.0–10.5)
nRBC: 0 % (ref 0.0–0.2)

## 2021-08-29 LAB — CORTISOL: Cortisol, Plasma: 7.1 ug/dL

## 2021-08-29 LAB — MRSA NEXT GEN BY PCR, NASAL: MRSA by PCR Next Gen: NOT DETECTED

## 2021-08-29 LAB — LACTIC ACID, PLASMA: Lactic Acid, Venous: 1.1 mmol/L (ref 0.5–1.9)

## 2021-08-29 LAB — HIV ANTIBODY (ROUTINE TESTING W REFLEX): HIV Screen 4th Generation wRfx: NONREACTIVE

## 2021-08-29 LAB — TSH: TSH: 2.972 u[IU]/mL (ref 0.350–4.500)

## 2021-08-29 MED ORDER — FLUOXETINE HCL 20 MG PO CAPS
20.0000 mg | ORAL_CAPSULE | Freq: Every day | ORAL | Status: DC
  Administered 2021-08-29 – 2021-08-31 (×3): 20 mg via ORAL
  Filled 2021-08-29 (×3): qty 1

## 2021-08-29 MED ORDER — SODIUM CHLORIDE 0.9 % IV SOLN
500.0000 mg | INTRAVENOUS | Status: DC
  Administered 2021-08-29 – 2021-08-31 (×3): 500 mg via INTRAVENOUS
  Filled 2021-08-29 (×3): qty 5

## 2021-08-29 MED ORDER — OMEGA-3-ACID ETHYL ESTERS 1 G PO CAPS
1.0000 g | ORAL_CAPSULE | Freq: Two times a day (BID) | ORAL | Status: DC
  Administered 2021-08-29 – 2021-08-31 (×5): 1 g via ORAL
  Filled 2021-08-29 (×5): qty 1

## 2021-08-29 MED ORDER — ASPIRIN EC 81 MG PO TBEC
81.0000 mg | DELAYED_RELEASE_TABLET | Freq: Every day | ORAL | Status: DC
Start: 2021-08-29 — End: 2021-08-31
  Administered 2021-08-29 – 2021-08-31 (×3): 81 mg via ORAL
  Filled 2021-08-29 (×3): qty 1

## 2021-08-29 MED ORDER — SODIUM CHLORIDE 0.9 % IV SOLN
2.0000 g | INTRAVENOUS | Status: DC
  Administered 2021-08-29 – 2021-08-31 (×3): 2 g via INTRAVENOUS
  Filled 2021-08-29 (×3): qty 20

## 2021-08-29 MED ORDER — GABAPENTIN 300 MG PO CAPS
300.0000 mg | ORAL_CAPSULE | Freq: Three times a day (TID) | ORAL | Status: DC
  Administered 2021-08-29 – 2021-08-31 (×7): 300 mg via ORAL
  Filled 2021-08-29 (×7): qty 1

## 2021-08-29 MED ORDER — SODIUM CHLORIDE 0.9 % IV BOLUS
500.0000 mL | Freq: Once | INTRAVENOUS | Status: AC
  Administered 2021-08-29: 500 mL via INTRAVENOUS

## 2021-08-29 MED ORDER — MEMANTINE HCL 10 MG PO TABS
10.0000 mg | ORAL_TABLET | Freq: Two times a day (BID) | ORAL | Status: DC
  Administered 2021-08-29 – 2021-08-31 (×6): 10 mg via ORAL
  Filled 2021-08-29 (×6): qty 1

## 2021-08-29 MED ORDER — IPRATROPIUM-ALBUTEROL 0.5-2.5 (3) MG/3ML IN SOLN: 3.0000 mL | Freq: Four times a day (QID) | RESPIRATORY_TRACT | Status: DC | PRN

## 2021-08-29 MED ORDER — ATORVASTATIN CALCIUM 20 MG PO TABS
20.0000 mg | ORAL_TABLET | Freq: Every day | ORAL | Status: DC
  Administered 2021-08-29 – 2021-08-31 (×3): 20 mg via ORAL
  Filled 2021-08-29 (×3): qty 1

## 2021-08-29 MED ORDER — TAMSULOSIN HCL 0.4 MG PO CAPS
0.4000 mg | ORAL_CAPSULE | Freq: Every day | ORAL | Status: DC
  Administered 2021-08-29 – 2021-08-31 (×3): 0.4 mg via ORAL
  Filled 2021-08-29 (×3): qty 1

## 2021-08-29 MED ORDER — VITAMIN B-12 1000 MCG PO TABS
500.0000 ug | ORAL_TABLET | Freq: Every day | ORAL | Status: DC
  Administered 2021-08-29 – 2021-08-31 (×3): 500 ug via ORAL
  Filled 2021-08-29 (×3): qty 1

## 2021-08-29 MED ORDER — BUDESONIDE 0.5 MG/2ML IN SUSP
0.5000 mg | Freq: Two times a day (BID) | RESPIRATORY_TRACT | Status: DC
  Administered 2021-08-29 – 2021-08-30 (×4): 0.5 mg via RESPIRATORY_TRACT
  Filled 2021-08-29 (×4): qty 2

## 2021-08-29 MED ORDER — ICOSAPENT ETHYL 1 G PO CAPS
2.0000 g | ORAL_CAPSULE | Freq: Two times a day (BID) | ORAL | Status: DC
  Filled 2021-08-29 (×3): qty 2

## 2021-08-29 MED ORDER — NICOTINE 14 MG/24HR TD PT24
14.0000 mg | MEDICATED_PATCH | Freq: Every day | TRANSDERMAL | Status: DC
  Administered 2021-08-29 – 2021-08-31 (×3): 14 mg via TRANSDERMAL
  Filled 2021-08-29 (×3): qty 1

## 2021-08-29 MED ORDER — MAGNESIUM SULFATE 50 % IJ SOLN: 1.0000 g | Freq: Once | INTRAMUSCULAR | Status: DC

## 2021-08-29 MED ORDER — HEPARIN SODIUM (PORCINE) 5000 UNIT/ML IJ SOLN
5000.0000 [IU] | Freq: Three times a day (TID) | INTRAMUSCULAR | Status: DC
  Administered 2021-08-29 – 2021-08-30 (×6): 5000 [IU] via SUBCUTANEOUS
  Filled 2021-08-29 (×6): qty 1

## 2021-08-29 MED ORDER — MAGNESIUM SULFATE IN D5W 1-5 GM/100ML-% IV SOLN
1.0000 g | Freq: Once | INTRAVENOUS | Status: AC
  Administered 2021-08-29: 1 g via INTRAVENOUS
  Filled 2021-08-29: qty 100

## 2021-08-29 MED ORDER — PANTOPRAZOLE SODIUM 40 MG PO TBEC
40.0000 mg | DELAYED_RELEASE_TABLET | Freq: Two times a day (BID) | ORAL | Status: DC
  Administered 2021-08-29 – 2021-08-31 (×6): 40 mg via ORAL
  Filled 2021-08-29 (×6): qty 1

## 2021-08-29 NOTE — Assessment & Plan Note (Signed)
Patient does not remember details Likely secondary to generalized weakness secondary to infection, AKI, dehydration PT eval and treat Treat infection and AKI as above Continue to monitor

## 2021-08-29 NOTE — Assessment & Plan Note (Signed)
3 ml/kg bolus in the ED Encourage p.o. intake Hold nephrotoxic agents when possible Recheck in the a.m.

## 2021-08-29 NOTE — Plan of Care (Signed)
°  Problem: Acute Rehab PT Goals(only PT should resolve) Goal: Pt Will Go Supine/Side To Sit Outcome: Progressing Flowsheets (Taken 08/29/2021 1529) Pt will go Supine/Side to Sit: with modified independence Goal: Patient Will Transfer Sit To/From Stand Outcome: Progressing Flowsheets (Taken 08/29/2021 1529) Patient will transfer sit to/from stand: with modified independence Goal: Pt Will Transfer Bed To Chair/Chair To Bed Outcome: Progressing Flowsheets (Taken 08/29/2021 1529) Pt will Transfer Bed to Chair/Chair to Bed:  with modified independence  with supervision Goal: Pt Will Ambulate Outcome: Progressing Flowsheets (Taken 08/29/2021 1529) Pt will Ambulate:  10 feet  with minimal assist  with rolling walker   3:30 PM, 08/29/21 Ocie Bob, MPT Physical Therapist with [facility] [phone] office [phone] mobile phone

## 2021-08-29 NOTE — TOC Initial Note (Signed)
Transition of Care [facility]) - Initial/Assessment Note    Patient Details  Name: Warren Sullivan MRN: [id number] Date of Birth: 26-Sep-1960  [facility]) CM/SW Contact:    Karn Cassis, LCSW Phone Number: 08/29/2021, 10:56 AM  Clinical Narrative: Pt admitted due to pneumonia. Unable to complete assessment with pt as pt oriented x2 per chart and SNF confirms this is baseline. Per Eunice Blase at [facility], pt has been a resident there for almost 6 years. He transfers to wheelchair. Pt's sister, Mardene Celeste is RP. LCSW left voicemail with Mardene Celeste requesting return call. Okay to return to [facility] at d/c. [facility] will continue to follow.                   Expected Discharge Plan: Skilled Nursing Facility Barriers to Discharge: Continued Medical Work up   Patient Goals and CMS Choice Patient states their goals for this hospitalization and ongoing recovery are:: anticipate return to SNF      Expected Discharge Plan and Services Expected Discharge Plan: Skilled Nursing Facility In-house Referral: Clinical Social Work   Post Acute Care Choice: Resumption of Svcs/PTA Provider Living arrangements for the past 2 months: Skilled Nursing Facility                                      Prior Living Arrangements/Services Living arrangements for the past 2 months: Skilled Nursing Facility Lives with:: Facility Resident Patient language and need for interpreter reviewed:: Yes        Need for Family Participation in Patient Care: Yes (Comment) Care giver support system in place?: Yes (comment)   Criminal Activity/Legal Involvement Pertinent to Current Situation/Hospitalization: No - Comment as needed  Activities of Daily Living Home Assistive Devices/Equipment: Wheelchair ADL Screening (condition at time of admission) Patient's cognitive ability adequate to safely complete daily activities?: No Is the patient deaf or have difficulty hearing?: No Does the patient  have difficulty seeing, even when wearing glasses/contacts?: No Does the patient have difficulty concentrating, remembering, or making decisions?: Yes Patient able to express need for assistance with ADLs?: Yes Does the patient have difficulty dressing or bathing?: Yes Independently performs ADLs?: No Communication: Needs assistance Is this a change from baseline?: Pre-admission baseline Dressing (OT): Needs assistance Is this a change from baseline?: Pre-admission baseline Grooming: Needs assistance Is this a change from baseline?: Pre-admission baseline Feeding: Appropriate for developmental age Bathing: Needs assistance Is this a change from baseline?: Pre-admission baseline Toileting: Needs assistance Is this a change from baseline?: Pre-admission baseline In/Out Bed: Needs assistance Is this a change from baseline?: Pre-admission baseline Walks in Home: Dependent Is this a change from baseline?: Pre-admission baseline Does the patient have difficulty walking or climbing stairs?: Yes Weakness of Legs: None Weakness of Arms/Hands: None  Permission Sought/Granted                  Emotional Assessment   Attitude/Demeanor/Rapport: Unable to Assess Affect (typically observed): Unable to Assess Orientation: : Oriented to Self, Oriented to Place Alcohol / Substance Use: Not Applicable Psych Involvement: No (comment)  Admission diagnosis:  Pneumonia [J18.9] Community acquired pneumonia, unspecified laterality [J18.9] Sepsis with acute renal failure without septic shock, due to unspecified organism, unspecified acute renal failure type (HCC) [A41.9, R65.20, N17.9] Patient Active Problem List   Diagnosis Date Noted   Fall at home, initial encounter 08/29/2021   Pneumonia 08/28/2021   Hypokalemia  08/18/2021   Hyponatremia 08/18/2021   AKI (acute kidney injury) (HCC) 08/18/2021   Hypocalcemia 08/18/2021   Hyperglycemia 08/18/2021   Vomiting 08/18/2021   GERD  (gastroesophageal reflux disease) 08/18/2021   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, [place] - [address] [place] [address] Phone: [phone] Fax: [phone]  [id number] - [place], [place] - [address] [place] [address] Phone: [phone] Fax: [phone]     Social Determinants of Health (SDOH) Interventions    Readmission Risk Interventions No flowsheet data found.

## 2021-08-29 NOTE — Assessment & Plan Note (Signed)
Patient reports he " smokes like a freight train." Continue nicotine patch Continue to monitor

## 2021-08-29 NOTE — Assessment & Plan Note (Signed)
-   Continue statin medication  

## 2021-08-29 NOTE — NC FL2 (Signed)
[place] MEDICAID FL2 LEVEL OF CARE SCREENING TOOL     IDENTIFICATION  Patient Name: Warren Sullivan Birthdate: 1961/01/03 Sex: male Admission Date (Current Location): 08/28/2021  [place] and [place] Number:  Aaron Edelman [id number] K Facility and Address:  [facility],  [address], [place] [address]      Provider Number: [id number]  Attending Physician Name and Address:  Vassie Loll, MD  Relative Name and Phone Number:       Current Level of Care: Hospital Recommended Level of Care: Skilled Nursing Facility Prior Approval Number:    Date Approved/Denied:   PASRR Number:    Discharge Plan: SNF    Current Diagnoses: Patient Active Problem List   Diagnosis Date Noted   Fall at home, initial encounter 08/29/2021   Pneumonia 08/28/2021   Hypokalemia 08/18/2021   Hyponatremia 08/18/2021   AKI (acute kidney injury) (HCC) 08/18/2021   Hypocalcemia 08/18/2021   Hyperglycemia 08/18/2021   Vomiting 08/18/2021   GERD (gastroesophageal reflux disease) 08/18/2021   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 See d/c summary for updates.)  AMBULATORY STATUS COMMUNICATION OF NEEDS Skin   Extensive Assist Verbally Normal                       Personal Care Assistance Level of Assistance  Bathing, Feeding, Dressing Bathing Assistance: Maximum assistance Feeding assistance: Limited assistance Dressing Assistance: Maximum assistance      Functional Limitations Info  Sight, Hearing, Speech Sight Info: Adequate Hearing Info: Adequate Speech Info: Adequate    SPECIAL CARE FACTORS FREQUENCY                       Contractures      Additional Factors Info  Code Status, Allergies, Psychotropic Code Status Info: Full code Allergies Info: No known allergies Psychotropic Info: Prozac         Current Medications (08/29/2021):  This is the current hospital active medication list Current Facility-Administered Medications  Medication Dose Route Frequency Provider Last Rate Last Admin   0.9 %  sodium chloride infusion   Intravenous Continuous 08/31/2021, MD 100 mL/hr at 08/29/21 0903 Rate Change at 08/29/21 0903   aspirin EC tablet 81 mg  81 mg Oral Q breakfast Zierle-Ghosh, Asia B, DO   81 mg at 08/29/21 0914   atorvastatin (LIPITOR) tablet 20 mg  20 mg Oral Daily Zierle-Ghosh, Asia B, DO   20 mg at 08/29/21 0914   azithromycin (ZITHROMAX) 500 mg in sodium chloride 0.9 % 250 mL IVPB  500 mg Intravenous Q24H Zierle-Ghosh, Asia B, DO 250 mL/hr at 08/29/21 0200 500 mg at 08/29/21 0200   budesonide (PULMICORT) nebulizer solution 0.5 mg  0.5 mg Nebulization BID 08/31/21, MD       cefTRIAXone (ROCEPHIN) 2 g in sodium chloride 0.9 % 100 mL IVPB  2 g Intravenous Q24H Zierle-Ghosh, Asia B, DO   Stopped at 08/29/21 0945   FLUoxetine (PROZAC) capsule 20  mg  20 mg Oral Daily Zierle-Ghosh, Asia B, DO   20 mg at 08/29/21 0914   gabapentin (NEURONTIN) capsule 300 mg  300 mg Oral TID Zierle-Ghosh, Asia B, DO   300 mg at 08/29/21 0914   heparin injection 5,000 Units  5,000 Units Subcutaneous Q8H Zierle-Ghosh, Asia B, DO   5,000 Units at 08/29/21 0451   ipratropium-albuterol (DUONEB) 0.5-2.5 (3) MG/3ML nebulizer solution 3 mL  3 mL Nebulization Q6H PRN Vassie Loll, MD       memantine [facility]) tablet 10 mg  10 mg Oral BID Zierle-Ghosh, Asia B, DO   10 mg at 08/29/21 1121   nicotine (NICODERM CQ - dosed in mg/24 hours) patch  14 mg  14 mg Transdermal Daily Zierle-Ghosh, Asia B, DO   14 mg at 08/29/21 6244   omega-3 acid ethyl esters (LOVAZA) capsule 1 g  1 g Oral BID Vassie Loll, MD   1 g at 08/29/21 0932   pantoprazole (PROTONIX) EC tablet 40 mg  40 mg Oral BID Zierle-Ghosh, Asia B, DO   40 mg at 08/29/21 6950   tamsulosin (FLOMAX) capsule 0.4 mg  0.4 mg Oral Daily Zierle-Ghosh, Asia B, DO   0.4 mg at 08/29/21 7225   vitamin B-12 (CYANOCOBALAMIN) tablet 500 mcg  500 mcg Oral Daily Zierle-Ghosh, Asia B, DO   500 mcg at 08/29/21 7505     Discharge Medications: Please see discharge summary for a list of discharge medications.  Relevant Imaging Results:  Relevant Lab Results:   Additional Information SSN: [id number]  Karn Cassis, LCSW

## 2021-08-29 NOTE — H&P (Signed)
History and Physical    Patient: Warren Sullivan [id number] DOB: 18-Jul-1960 DOA: 08/28/2021 DOS: the patient was seen and examined on 08/29/2021 PCP: Mechele Claude, MD  Patient coming from: SNF  Chief Complaint:  Chief Complaint  Patient presents with   Fall    HPI: Warren Sullivan is a 61 y.o. male with medical history significant of with history of CVA with right-sided residual weakness, GERD,, hyperlipidemia, hypertension, and more presents the ED with a chief complaint of fall.  Unfortunately patient is not able to provide any history and this is his baseline with his dementia.  He is very pleasant, but does not know why he is in the hospital.  When reminded that he had a fall, he reports that he did.  He does not think he lost consciousness or hit his head.  He does not remember having any chest pain.  It is reported the patient was sitting in his chair and he fell out of the chair to the right.  He was not complaining of any pain afterwards.  He still not complaining of any pain during my exam.  The facility wanted him to have an x-ray of his shoulder to rule out fracture.  When he arrived in the ED he was hypotensive.  Sepsis work-up was done.  Looks like he has a pneumonia.  Patient was given 30 mL/kg bolus.  He was started on cefepime, vancomycin, Flagyl.  Patient does have a recent admission August 20, 2021 was the discharge date.  That was for AKI and small bowel obstruction.  Patient was advised to follow-up with GI, avoid NSAIDs, and follow-up for lab work.  It appears that he has not followed up for lab work yet.  There are also no notes from GI in the system.  Patient has no other complaints at this time, and reports that he feels fine.  He was put on oxygen in the ED mostly for comfort.  He was hovering around 90-91% on room air, so he was put on 2 L nasal cannula.  Review of Systems: unable to review all systems due to the inability of the patient to answer questions. Past  Medical History:  Diagnosis Date   CVA (cerebral vascular accident) (HCC)    Dementia in other diseases classified elsewhere, severe, without behavioral disturbance, psychotic disturbance, mood disturbance, and anxiety    GERD (gastroesophageal reflux disease)    Hemiplegia, unspecified affecting right dominant side (HCC)    Hyperlipidemia    Hypertension    MDD (major depressive disorder)    Stroke (HCC)    Vitamin D deficiency    Past Surgical History:  Procedure Laterality Date   BIOPSY  08/18/2021   Procedure: BIOPSY;  Surgeon: Marguerita Merles, Reuel Boom, MD;  Location: [facility];  Service: Gastroenterology;;  Bonnielee Haff and distal esophagus biopsies    ESOPHAGOGASTRODUODENOSCOPY (EGD) WITH PROPOFOL N/A 08/18/2021   Procedure: ESOPHAGOGASTRODUODENOSCOPY (EGD) WITH PROPOFOL;  Surgeon: Dolores Frame, MD;  Location: [facility];  Service: Gastroenterology;  Laterality: N/A;   HERNIA REPAIR     Social History:  reports that he has been smoking. He does not have any smokeless tobacco history on file. He reports that he does not drink alcohol and does not use drugs.  No Known Allergies  Family History  Problem Relation Age of Onset   Hypertension Mother    Stroke Mother    Hypertension Father    Hypertension Sister     Prior to Admission medications  Medication Sig Start Date End Date Taking? Authorizing Provider  acetaminophen (TYLENOL) 650 MG CR tablet Take 650 mg by mouth every 8 (eight) hours as needed for pain.    [provider]  aspirin EC 81 MG tablet Take 1 tablet (81 mg total) by mouth daily with breakfast. Swallow whole. 08/20/21   Shon Hale, MD  atorvastatin (LIPITOR) 20 MG tablet Take 1 tablet (20 mg total) by mouth daily. 08/20/21   Shon Hale, MD  Bismuth 262 MG CHEW Chew 2 each by mouth every 6 (six) hours. 08/23/21   Dolores Frame, MD  Calcium Carb-Cholecalciferol (CALCIUM-VITAMIN D3) 250-3.125 MG-MCG TABS Take  1 tablet by mouth daily.    [provider]  cloNIDine (CATAPRES) 0.1 MG tablet Take 2 tablets (0.2 mg total) by mouth 2 (two) times daily. 08/20/21   Shon Hale, MD  FLUoxetine (PROZAC) 20 MG capsule Take 20 mg by mouth daily. 08/01/21   [provider]  gabapentin (NEURONTIN) 300 MG capsule Take 1 capsule (300 mg total) by mouth 3 (three) times daily. Start with one at bedtime for 1 week. Then two at bedtime for one week, then go to full dose. 10/19/15   Mechele Claude, MD  losartan (COZAAR) 100 MG tablet Take 100 mg by mouth daily. 08/07/21   [provider]  memantine (NAMENDA) 10 MG tablet Take 10 mg by mouth 2 (two) times daily. 07/24/21   [provider]  metroNIDAZOLE (FLAGYL) 500 MG tablet Take 1 tablet (500 mg total) by mouth 3 (three) times daily for 14 days. 08/23/21 09/06/21  Dolores Frame, MD  Multiple Vitamin (MULTIVITAMIN) tablet Take 1 tablet by mouth daily.    [provider]  nicotine (NICODERM CQ - DOSED IN MG/24 HOURS) 14 mg/24hr patch Place 1 patch (14 mg total) onto the skin daily. 08/21/21   Shon Hale, MD  pantoprazole (PROTONIX) 40 MG tablet Take 1 tablet (40 mg total) by mouth 2 (two) times daily. 08/20/21 08/20/22  Shon Hale, MD  tamsulosin (FLOMAX) 0.4 MG CAPS capsule Take 0.4 mg by mouth daily. 07/11/21   [provider]  tetracycline (SUMYCIN) 500 MG capsule Take 1 capsule (500 mg total) by mouth 4 (four) times daily for 14 days. 08/23/21 09/06/21  Dolores Frame, MD  VASCEPA 1 g capsule Take 2 g by mouth 2 (two) times daily. 08/08/21   [provider]  vitamin B-12 (CYANOCOBALAMIN) 500 MCG tablet Take 500 mcg by mouth daily.    [provider]  Vitamin D3 (VITAMIN D) 25 MCG tablet Take 5,000 Units by mouth daily.    [provider]    Physical Exam: Vitals:   08/28/21 2227 08/28/21 2230 08/29/21 0000 08/29/21 0032  BP:  96/62 114/60 (!) 92/59  Pulse: 61 (!) 59  81 67  Resp: 19 18 17 14   Temp:    98.4 F (36.9 C)  TempSrc:      SpO2: 91% 91% 93% 95%  Weight:      Height:       1.  General: Patient lying supine in bed,  no acute distress   2. Psychiatric: Alert and oriented x self and year but not context or place, mood and behavior normal for situation, pleasant and cooperative with exam   3. Neurologic: Speech and language are normal, face is symmetric, moves all 4 extremities voluntarily, at baseline without acute deficits on limited exam   4. HEENMT:  Head is atraumatic, normocephalic, pupils reactive to light,  neck is supple, trachea is midline, mucous membranes are moist   5. Respiratory : Lungs are mission the lower lung fields but otherwise clear to auscultation bilaterally without wheezing, rhonchi, rales, no cyanosis, no increase in work of breathing or accessory muscle use, 2 L nasal cannula in place, speaking in full sentences   6. Cardiovascular : Heart rate normal, rhythm is regular, no murmurs, rubs or gallops, no peripheral edema, peripheral pulses palpated   7. Gastrointestinal:  Abdomen is soft, nondistended, nontender to palpation bowel sounds active, no masses or organomegaly palpated   8. Skin:  Skin is warm, dry and intact without rashes, acute lesions, or ulcers on limited exam   9.Musculoskeletal:  No acute deformities or trauma, no asymmetry in tone, no peripheral edema, peripheral pulses palpated, no tenderness to palpation in the extremities   Data Reviewed: In the ED 98.2, heart rate 59-65, respiratory rate 17-19, blood pressure 90/59-96/62, satting at 90-91% on room air Blood cultures pending Leukocytosis 21.1, hemoglobin 10.1 AKI with creatinine of 2.77 up from 1.06 Chest x-ray shows patchy lower lung airspace disease concerning for pneumonia G shows a heart rate of 55, sinus bradycardia, QTc 411 Chart review as mentioned in HPI Cefepime, bank, Flagyl started in the ED, patient given 30 mL/kg  bolus  Assessment and Plan: * Pneumonia- (present on admission) Pneumonia finding on chest x-ray Cefepime, bank, Flagyl started in the ED Continue Rocephin and Zithromax Culture expectorated sputum Blood cultures pending Continue oxygen supplementation as needed for comfort Continue to monitor   Fall at home, initial encounter Patient does not remember details Likely secondary to generalized weakness secondary to infection, AKI, dehydration PT eval and treat Treat infection and AKI as above Continue to monitor  Hyperlipidemia, unspecified- (present on admission) Continue statin medication  GERD (gastroesophageal reflux disease)- (present on admission) Continue Protonix  AKI (acute kidney injury) (HCC)- (present on admission) 3 ml/kg bolus in the ED Encourage p.o. intake Hold nephrotoxic agents when possible Recheck in the a.m.  Tobacco dependence- (present on admission) Patient reports he " smokes like a freight train." Continue nicotine patch Continue to monitor  Essential hypertension- (present on admission) Holding clonidine and losartan as patient was hypotensive upon presentation to ED       Advance Care Planning:   Code Status: Full Code   Consults: None  Family Communication: No family at bedside  Severity of Illness: The appropriate patient status for this patient is INPATIENT. Inpatient status is judged to be reasonable and necessary in order to provide the required intensity of service to ensure the patient's safety. The patient's presenting symptoms, physical exam findings, and initial radiographic and laboratory data in the context of their chronic comorbidities is felt to place them at high risk for further clinical deterioration. Furthermore, it is not anticipated that the patient will be medically stable for discharge from the hospital within 2 midnights of admission.   * I certify that at the point of admission it is my clinical judgment that the  patient will require inpatient hospital care spanning beyond 2 midnights from the point of admission due to high intensity of service, high risk for further deterioration and high frequency of surveillance required.*  Author: Lilyan Gilford, DO 08/29/2021 2:28 AM  For on call review [email or web address].

## 2021-08-29 NOTE — Progress Notes (Signed)
°   08/29/21 0546  Assess: MEWS Score  Temp 97.9 F (36.6 C)  BP (!) 73/43  Pulse Rate (!) 56  Resp 16  SpO2 97 %  O2 Device Nasal Cannula  Assess: MEWS Score  MEWS Temp 0  MEWS Systolic 2  MEWS Pulse 0  MEWS RR 0  MEWS LOC 0  MEWS Score 2  MEWS Score Color Yellow   MD ordered 500 ml bolus. Will recheck BP

## 2021-08-29 NOTE — Assessment & Plan Note (Signed)
Continue Protonix °

## 2021-08-29 NOTE — Assessment & Plan Note (Signed)
Holding clonidine and losartan as patient was hypotensive upon presentation to ED

## 2021-08-29 NOTE — Evaluation (Signed)
Physical Therapy Evaluation Patient Details Name: Warren Sullivan MRN: [id number] DOB: 1961/02/03 Today's Date: 08/29/2021  History of Present Illness  Warren Sullivan is a 61 y.o. male with medical history significant of with history of CVA with right-sided residual weakness, GERD,, hyperlipidemia, hypertension, and more presents the ED with a chief complaint of fall.  Unfortunately patient is not able to provide any history and this is his baseline with his dementia.  He is very pleasant, but does not know why he is in the hospital.  When reminded that he had a fall, he reports that he did.  He does not think he lost consciousness or hit his head.  He does not remember having any chest pain.  It is reported the patient was sitting in his chair and he fell out of the chair to the right.  He was not complaining of any pain afterwards.  He still not complaining of any pain during my exam.  The facility wanted him to have an x-ray of his shoulder to rule out fracture.  When he arrived in the ED he was hypotensive.  Sepsis work-up was done.  Looks like he has a pneumonia.  Patient was given 30 mL/kg bolus.  He was started on cefepime, vancomycin, Flagyl.  Patient does have a recent admission August 20, 2021 was the discharge date.  That was for AKI and small bowel obstruction.  Patient was advised to follow-up with GI, avoid NSAIDs, and follow-up for lab work.  It appears that he has not followed up for lab work yet.  There are also no notes from GI in the system.  Patient has no other complaints at this time, and reports that he feels fine.  He was put on oxygen in the ED mostly for comfort.  He was hovering around 90-91% on room air, so he was put on 2 L nasal cannula.   Clinical Impression  Patient slightly impulsive, very unsteady on feet and unsafe to ambulate beyond bedside due to buckling of knees and poor safety awareness.  Patient able to transfer to chair using RW with Min/mod assist.  Patient  tolerated sitting up in chair after therapy with visitor present in room - nursing staff notified.  Patient will benefit from continued skilled physical therapy in hospital and recommended venue below to increase strength, balance, endurance for safe ADLs and gait.         Recommendations for follow up therapy are one component of a multi-disciplinary discharge planning process, led by the attending physician.  Recommendations may be updated based on patient status, additional functional criteria and insurance authorization.  Follow Up Recommendations Skilled nursing-short term rehab (<3 hours/day)    Assistance Recommended at Discharge Intermittent Supervision/Assistance  Patient can return home with the following  A lot of help with walking and/or transfers;A lot of help with bathing/dressing/bathroom;Help with stairs or ramp for entrance;Assistance with cooking/housework    Equipment Recommendations None recommended by PT  Recommendations for Other Services       Functional Status Assessment Patient has had a recent decline in their functional status and demonstrates the ability to make significant improvements in function in a reasonable and predictable amount of time.     Precautions / Restrictions Precautions Precautions: Fall Restrictions Weight Bearing Restrictions: No      Mobility  Bed Mobility Overal bed mobility: Needs Assistance Bed Mobility: Supine to Sit     Supine to sit: Min guard     General bed mobility  comments: slightly labored movement    Transfers Overall transfer level: Needs assistance Equipment used: Rolling walker (2 wheels) Transfers: Sit to/from Stand, Bed to chair/wheelchair/BSC Sit to Stand: Min guard, Min assist   Step pivot transfers: Min assist, Mod assist       General transfer comment: slow labored movement, verbal/tactile cueing due to impulsive behavior    Ambulation/Gait Ambulation/Gait assistance: Mod assist Gait Distance  (Feet): 6 Feet Assistive device: Rolling walker (2 wheels) Gait Pattern/deviations: Decreased step length - left, Decreased step length - right, Decreased stride length, Trunk flexed Gait velocity: decreased     General Gait Details: limited to a few steps at bedside due to fatigue and buckling of knees, poor safety awareness  Stairs            Wheelchair Mobility    Modified Rankin (Stroke Patients Only)       Balance Overall balance assessment: Needs assistance Sitting-balance support: Feet supported, No upper extremity supported Sitting balance-Leahy Scale: Good Sitting balance - Comments: seated at EOB   Standing balance support: During functional activity, Bilateral upper extremity supported, Reliant on assistive device for balance Standing balance-Leahy Scale: Fair Standing balance comment: fair/poor using RW                             Pertinent Vitals/Pain Pain Assessment Pain Assessment: No/denies pain    Home Living Family/patient expects to be discharged to:: Skilled nursing facility                        Prior Function Prior Level of Function : Needs assist       Physical Assist : Mobility (physical);ADLs (physical) Mobility (physical): Bed mobility;Transfers;Gait;Stairs   Mobility Comments: Mod Independent transfers to wheelchair, very short household distances using RW with SNF staff assisting ADLs Comments: assisted by SNF staff     Hand Dominance        Extremity/Trunk Assessment   Upper Extremity Assessment Upper Extremity Assessment: Overall WFL for tasks assessed    Lower Extremity Assessment Lower Extremity Assessment: Generalized weakness    Cervical / Trunk Assessment Cervical / Trunk Assessment: Kyphotic  Communication   Communication: No difficulties  Cognition Arousal/Alertness: Awake/alert Behavior During Therapy: WFL for tasks assessed/performed Overall Cognitive Status: History of cognitive  impairments - at baseline                                          General Comments      Exercises     Assessment/Plan    PT Assessment Patient needs continued PT services  PT Problem List Decreased strength;Decreased activity tolerance;Decreased balance;Decreased mobility       PT Treatment Interventions DME instruction;Gait training;Functional mobility training;Therapeutic activities;Therapeutic exercise;Wheelchair mobility training;Balance training    PT Goals (Current goals can be found in the Care Plan section)  Acute Rehab PT Goals Patient Stated Goal: return to LTC PT Goal Formulation: With patient/family Time For Goal Achievement: 09/05/21 Potential to Achieve Goals: Good    Frequency Min 2X/week     Co-evaluation               AM-PAC PT "6 Clicks" Mobility  Outcome Measure Help needed turning from your back to your side while in a flat bed without using bedrails?: A Little Help needed moving from lying  on your back to sitting on the side of a flat bed without using bedrails?: A Little Help needed moving to and from a bed to a chair (including a wheelchair)?: A Little Help needed standing up from a chair using your arms (e.g., wheelchair or bedside chair)?: A Little Help needed to walk in hospital room?: A Lot Help needed climbing 3-5 steps with a railing? : Total 6 Click Score: 15    End of Session   Activity Tolerance: Patient tolerated treatment well;Patient limited by fatigue Patient left: in chair;with call bell/phone within reach;with family/visitor present Nurse Communication: Mobility status PT Visit Diagnosis: Unsteadiness on feet (R26.81);Other abnormalities of gait and mobility (R26.89);Muscle weakness (generalized) (M62.81)    Time: [id number] PT Time Calculation (min) (ACUTE ONLY): 29 min   Charges:   PT Evaluation $PT Eval Moderate Complexity: 1 Mod PT Treatments $Therapeutic Activity: 23-37 mins        3:28 PM,  08/29/21 Lonell Grandchild, MPT Physical Therapist with [facility] 336 [phone] office [phone] mobile phone

## 2021-08-29 NOTE — Progress Notes (Signed)
Patient seen and examined.  Admitted after midnight secondary to shortness of breath, cough and episode of passing out.  Patient with a prior admission 2 weeks ago secondary to emesis in the setting of SBO/ileus.  Presented now with what appears to be healthcare associated pneumonia.  Patient has met severe sepsis criteria at time of admission with isolated source of infection (positive chest x-ray for pneumonia), elevated WBCs, hypotension and acute kidney injury for organ dysfunction.  Fluid resuscitation and broad-spectrum antibiotics started in the ED.  Cultures has been taken.  Please refer to H&P written by Dr. Clearence Ped for further info/details on admission.  Plan: -Continue holding antihypertensive agents -Check cortisol level -Continue fluid resuscitation -Despite still present and so blood pressure patient with normal mentation currently. -Normal lactic acid level appreciated. -Follow renal function and electrolytes trend -started on PRN nebulizer, pulmicort and flutter valve -Follow cultures results and clinical response.   Barton Dubois MD [phone]

## 2021-08-29 NOTE — Progress Notes (Signed)
Gave patient flutter valve.  Patient did X10 with good patient effort.  Left at bedside with patient.

## 2021-08-29 NOTE — Assessment & Plan Note (Signed)
Pneumonia finding on chest x-ray Cefepime, bank, Flagyl started in the ED Continue Rocephin and Zithromax Culture expectorated sputum Blood cultures pending Continue oxygen supplementation as needed for comfort Continue to monitor

## 2021-08-30 DIAGNOSIS — I10 Essential (primary) hypertension: Secondary | ICD-10-CM | POA: Diagnosis not present

## 2021-08-30 DIAGNOSIS — E785 Hyperlipidemia, unspecified: Secondary | ICD-10-CM

## 2021-08-30 DIAGNOSIS — J189 Pneumonia, unspecified organism: Secondary | ICD-10-CM | POA: Diagnosis not present

## 2021-08-30 DIAGNOSIS — N179 Acute kidney failure, unspecified: Secondary | ICD-10-CM | POA: Diagnosis not present

## 2021-08-30 LAB — URINALYSIS, ROUTINE W REFLEX MICROSCOPIC
Bacteria, UA: NONE SEEN
Bilirubin Urine: NEGATIVE
Glucose, UA: NEGATIVE mg/dL
Ketones, ur: NEGATIVE mg/dL
Leukocytes,Ua: NEGATIVE
Nitrite: NEGATIVE
Protein, ur: NEGATIVE mg/dL
Specific Gravity, Urine: 1.016 (ref 1.005–1.030)
pH: 5 (ref 5.0–8.0)

## 2021-08-30 MED ORDER — COSYNTROPIN 0.25 MG IJ SOLR
0.2500 mg | Freq: Once | INTRAMUSCULAR | Status: AC
Start: 2021-08-31 — End: 2021-08-31
  Administered 2021-08-31: 0.25 mg via INTRAVENOUS
  Filled 2021-08-30: qty 0.25

## 2021-08-30 NOTE — Progress Notes (Signed)
PROGRESS NOTE     Warren Sullivan, is a 61 y.o. male, DOB - 21-Sep-1960, YBO:[id number]  Admit date - 08/28/2021   Admitting Physician Warren Gilford, DO  Outpatient Primary MD for the patient is Warren Claude, MD  LOS - 2  Chief Complaint  Patient presents with   Fall        Brief Narrative:  61 y.o. male reformed alcoholic with medical history significant of prior stroke with Vascular dementia, hypertension, hyperlipidemia,  BPH , tobacco abuse admitted from [facility] on 08/29/2020 with concerns about pneumonia    -Assessment and Plan: * Pneumonia- (present on admission) -Continue IV Rocephin and Zithromax pending further culture data -Bronchodilators, mucolytics as ordered  Fall at home, initial encounter Patient does not remember details Likely secondary to generalized weakness secondary to infection/dehydration PT eval and treat  aKI-due to dehydration and poor intake renally adjust medications, avoid nephrotoxic agents / dehydration  / hypotension  History of strokes/vascular Dementia with behavioral disturbance--- stable, continue statin  GERD/H. pylori infection Continue Protonix -Recent biopsies from EGD consistent with H. pylori, -Hold off on H. pylori triple regimen until patient has completed his antibiotics for pneumonia  Tobacco dependence-= smoking much less lately while at facility  Essential hypertension- (present on admission) Holding clonidine and losartan as patient was hypotensive upon presentation to ED  Disposition/Need for in-Hospital Stay- patient unable to be discharged at this time due to --- requiring IV Rocephin and azithromycin along with bronchodilators and mucolytics*  Status is: Inpatient   Disposition: The patient is from: SNF              Anticipated d/c is to: SNF              Anticipated d/c date is: 1 day              Patient currently is not medically stable to d/c. Barriers: Not Clinically Stable-   Code Status :   -  Code Status: Full Code   Family Communication:     -Patient's son is POA  DVT Prophylaxis  :   - SCDs  heparin injection 5,000 Units Start: 08/29/21 0600 SCDs Start: 08/29/21 0044   Lab Results  Component Value Date   PLT 145 (L) 08/29/2021    Inpatient Medications  Scheduled Meds:  aspirin EC  81 mg Oral Q breakfast   atorvastatin  20 mg Oral Daily   budesonide (PULMICORT) nebulizer solution  0.5 mg Nebulization BID   FLUoxetine  20 mg Oral Daily   gabapentin  300 mg Oral TID   heparin  5,000 Units Subcutaneous Q8H   memantine  10 mg Oral BID   nicotine  14 mg Transdermal Daily   omega-3 acid ethyl esters  1 g Oral BID   pantoprazole  40 mg Oral BID   tamsulosin  0.4 mg Oral Daily   vitamin B-12  500 mcg Oral Daily   Continuous Infusions:  azithromycin 500 mg (08/30/21 0030)   cefTRIAXone (ROCEPHIN)  IV 2 g (08/30/21 0939)   PRN Meds:.ipratropium-albuterol   Anti-infectives (From admission, onward)    Start     Dose/Rate Route Frequency Ordered Stop   08/29/21 1000  ceFEPIme (MAXIPIME) 2 g in sodium chloride 0.9 % 100 mL IVPB  Status:  Discontinued        2 g 200 mL/hr over 30 Minutes Intravenous Every 12 hours 08/28/21 2213 08/29/21 0043   08/29/21 0900  cefTRIAXone (ROCEPHIN) 2 g in sodium chloride  0.9 % 100 mL IVPB        2 g 200 mL/hr over 30 Minutes Intravenous Every 24 hours 08/29/21 0043 09/03/21 0859   08/29/21 0043  azithromycin (ZITHROMAX) 500 mg in sodium chloride 0.9 % 250 mL IVPB        500 mg 250 mL/hr over 60 Minutes Intravenous Every 24 hours 08/29/21 0043 09/03/21 0129   08/28/21 2230  vancomycin (VANCOREADY) IVPB 2000 mg/400 mL  Status:  Discontinued        2,000 mg 200 mL/hr over 120 Minutes Intravenous Every 48 hours 08/28/21 2216 08/29/21 0043   08/28/21 2215  ceFEPIme (MAXIPIME) 2 g in sodium chloride 0.9 % 100 mL IVPB        2 g 200 mL/hr over 30 Minutes Intravenous  Once 08/28/21 2208 08/28/21 2319   08/28/21 2215  metroNIDAZOLE  (FLAGYL) IVPB 500 mg        500 mg 100 mL/hr over 60 Minutes Intravenous  Once 08/28/21 2208 08/29/21 0011   08/28/21 2215  vancomycin (VANCOCIN) IVPB 1000 mg/200 mL premix  Status:  Discontinued        1,000 mg 200 mL/hr over 60 Minutes Intravenous  Once 08/28/21 2208 08/28/21 2215         Subjective: Warren Sullivan today has no fevers, no emesis,  No chest pain,   -Cough and shortness of breath persist, -Remains weak   Objective: Vitals:   08/30/21 0507 08/30/21 0826 08/30/21 1300 08/30/21 1400  BP: 121/79  (!) 153/95 133/87  Pulse: 67  69 65  Resp: 14  18 16   Temp: 98.3 F (36.8 C)  98.7 F (37.1 C) 98.5 F (36.9 C)  TempSrc:   Oral Oral  SpO2: 95% 96% 98% 100%  Weight:      Height:        Intake/Output Summary (Last 24 hours) at 08/30/2021 1814 Last data filed at 08/30/2021 1300 Gross per 24 hour  Intake 1200 ml  Output 1000 ml  Net 200 ml   Filed Weights   08/28/21 1813 08/29/21 0032  Weight: 91.4 kg 100 kg    Physical Exam  Gen:- Awake Alert, in no acute distress HEENT:- [place].AT, No sclera icterus Neck-Supple Neck,No JVD,.  Lungs-diminished breath sounds with scattered rhonchi bilaterally CV- S1, S2 normal, regular  Abd-  +ve B.Sounds, Abd Soft, No tenderness,    Extremity/Skin:- No  edema, pedal pulses present  Psych- baseline cognitive and memory deficits noted, less disoriented  neuro-generalized weakness, no new focal deficits, no tremors  Data Reviewed: I have personally reviewed following labs and imaging studies  CBC: Recent Labs  Lab 08/28/21 2130 08/29/21 0350  WBC 21.1* 19.0*  NEUTROABS 18.5* 15.8*  HGB 10.1* 9.0*  HCT 31.2* 28.8*  MCV 93.1 95.7  PLT 142* 145*   Basic Metabolic Panel: Recent Labs  Lab 08/28/21 2130 08/29/21 0350  NA 135 137  K 4.1 3.8  CL 103 108  CO2 25 23  GLUCOSE 151* 117*  BUN 37* 35*  CREATININE 2.77* 2.12*  CALCIUM 8.0* 7.4*  MG  --  1.6*   GFR: Estimated Creatinine Clearance: 42.1 mL/min (A)  (by C-G formula based on SCr of 2.12 mg/dL (H)). Liver Function Tests: Recent Labs  Lab 08/29/21 0350  AST 22  ALT 13  ALKPHOS 46  BILITOT 0.7  PROT 5.5*  ALBUMIN 2.6*   Cardiac Enzymes: No results for input(s): CKTOTAL, CKMB, CKMBINDEX, TROPONINI in the last 168 hours. BNP (last 3 results) No  results for input(s): PROBNP in the last 8760 hours. HbA1C: No results for input(s): HGBA1C in the last 72 hours. Sepsis Labs: @LABRCNTIP (procalcitonin:4,lacticidven:4) ) Recent Results (from the past 240 hour(s))  Blood culture (routine x 2)     Status: None (Preliminary result)   Collection Time: 08/28/21 10:21 PM   Specimen: BLOOD  Result Value Ref Range Status   Specimen Description BLOOD RIGHT ANTECUBITAL  Final   Special Requests   Final    BOTTLES DRAWN AEROBIC AND ANAEROBIC Blood Culture adequate volume   Culture   Final    NO GROWTH 2 DAYS Performed at [facility], [address], [place], [place]    Report Status PENDING  Incomplete  Blood culture (routine x 2)     Status: None (Preliminary result)   Collection Time: 08/28/21 10:21 PM   Specimen: BLOOD  Result Value Ref Range Status   Specimen Description BLOOD LEFT ANTECUBITAL  Final   Special Requests   Final    BOTTLES DRAWN AEROBIC AND ANAEROBIC Blood Culture results may not be optimal due to an excessive volume of blood received in culture bottles   Culture   Final    NO GROWTH 2 DAYS Performed at [facility], [address] [place], [place]    Report Status PENDING  Incomplete  Resp Panel by RT-PCR (Flu A&B, Covid) Nasopharyngeal Swab     Status: None   Collection Time: 08/28/21 10:33 PM   Specimen: Nasopharyngeal Swab; Nasopharyngeal(NP) swabs in vial transport medium  Result Value Ref Range Status   SARS Coronavirus 2 by RT PCR NEGATIVE NEGATIVE Final    Comment: (NOTE) SARS-CoV-2 target nucleic acids are NOT DETECTED.  The SARS-CoV-2 RNA is generally detectable in upper  respiratory specimens during the acute phase of infection. The lowest concentration of SARS-CoV-2 viral copies this assay can detect is 138 copies/mL. A negative result does not preclude SARS-Cov-2 infection and should not be used as the sole basis for treatment or other patient management decisions. A negative result may occur with  improper specimen collection/handling, submission of specimen other than nasopharyngeal swab, presence of viral mutation(s) within the areas targeted by this assay, and inadequate number of viral copies(<138 copies/mL). A negative result must be combined with clinical observations, patient history, and epidemiological information. The expected result is Negative.  Fact Sheet for Patients:  08/30/21  Fact Sheet for Healthcare Providers:  BloggerCourse.com  This test is no t yet approved or cleared by the [employer].[place] FDA and  has been authorized for detection and/or diagnosis of SARS-CoV-2 by FDA under an Emergency Use Authorization (EUA). This EUA will remain  in effect (meaning this test can be used) for the duration of the COVID-19 declaration under Section 564(b)(1) of the Act, 21 U.S.C.section 360bbb-3(b)(1), unless the authorization is terminated  or revoked sooner.       Influenza A by PCR NEGATIVE NEGATIVE Final   Influenza B by PCR NEGATIVE NEGATIVE Final    Comment: (NOTE) The Xpert Xpress SARS-CoV-2/FLU/RSV plus assay is intended as an aid in the diagnosis of influenza from Nasopharyngeal swab specimens and should not be used as a sole basis for treatment. Nasal washings and aspirates are unacceptable for Xpert Xpress SARS-CoV-2/FLU/RSV testing.  Fact Sheet for Patients: [place]  Fact Sheet for Healthcare Providers: BloggerCourse.com  This test is not yet approved or cleared by the Serious[employer]ker.it FDA and has been  authorized for detection and/or diagnosis of SARS-CoV-2 by FDA under an Emergency Use  Authorization (EUA). This EUA will remain in effect (meaning this test can be used) for the duration of the COVID-19 declaration under Section 564(b)(1) of the Act, 21 U.S.C. section 360bbb-3(b)(1), unless the authorization is terminated or revoked.  Performed at [facility], [address], [place], [place] [address]   MRSA Next Gen by PCR, Nasal     Status: None   Collection Time: 08/29/21  4:55 AM   Specimen: Nasal Mucosa; Nasal Swab  Result Value Ref Range Status   MRSA by PCR Next Gen NOT DETECTED NOT DETECTED Final    Comment: (NOTE) The GeneXpert MRSA Assay (FDA approved for NASAL specimens only), is one component of a comprehensive MRSA colonization surveillance program. It is not intended to diagnose MRSA infection nor to guide or monitor treatment for MRSA infections. Test performance is not FDA approved in patients less than 69 years old. Performed at [facility], [address], [place], [place] [address]       Radiology Studies: DG Chest 2 View  Result Date: 08/28/2021 CLINICAL DATA:  Larey Seat out of wheelchair EXAM: CHEST - 2 VIEW COMPARISON:  08/18/2021 FINDINGS: Patchy airspace disease in the lower lungs. No pleural effusion. Stable cardiomediastinal silhouette with aortic atherosclerosis. IMPRESSION: Patchy lower lung airspace disease concerning for pneumonia. Radiographic follow-up to resolution is recommended. Electronically Signed   By: Jasmine Pang M.D.   On: 08/28/2021 22:20   DG Shoulder Left  Result Date: 08/28/2021 CLINICAL DATA:  Fall today.  Rule out fracture. EXAM: LEFT SHOULDER - 2+ VIEW COMPARISON:  None. FINDINGS: Osseous alignment is normal. No fracture line or displaced fracture fragment is seen. No significant degenerative change. Soft tissues about the LEFT shoulder are unremarkable. IMPRESSION: Negative. Electronically Signed   By: Bary Richard M.D.   On: 08/28/2021  21:22     Scheduled Meds:  aspirin EC  81 mg Oral Q breakfast   atorvastatin  20 mg Oral Daily   budesonide (PULMICORT) nebulizer solution  0.5 mg Nebulization BID   FLUoxetine  20 mg Oral Daily   gabapentin  300 mg Oral TID   heparin  5,000 Units Subcutaneous Q8H   memantine  10 mg Oral BID   nicotine  14 mg Transdermal Daily   omega-3 acid ethyl esters  1 g Oral BID   pantoprazole  40 mg Oral BID   tamsulosin  0.4 mg Oral Daily   vitamin B-12  500 mcg Oral Daily   Continuous Infusions:  azithromycin 500 mg (08/30/21 0030)   cefTRIAXone (ROCEPHIN)  IV 2 g (08/30/21 0939)     LOS: 2 days    Shon Hale M.D on 08/30/2021 at 6:14 PM  Go to [email or web address] - for contact info  [facility] - Office  [phone]  If 7PM-7AM, please contact night-coverage [email or web address] Password [facility] 08/30/2021, 6:14 PM

## 2021-08-31 ENCOUNTER — Inpatient Hospital Stay (HOSPITAL_COMMUNITY): Payer: Medicare Other

## 2021-08-31 DIAGNOSIS — F17200 Nicotine dependence, unspecified, uncomplicated: Secondary | ICD-10-CM

## 2021-08-31 LAB — RENAL FUNCTION PANEL
Albumin: 2.5 g/dL — ABNORMAL LOW (ref 3.5–5.0)
Anion gap: 6 (ref 5–15)
BUN: 15 mg/dL (ref 8–23)
CO2: 23 mmol/L (ref 22–32)
Calcium: 8.1 mg/dL — ABNORMAL LOW (ref 8.9–10.3)
Chloride: 109 mmol/L (ref 98–111)
Creatinine, Ser: 1.14 mg/dL (ref 0.61–1.24)
GFR, Estimated: >60 mL/min (ref >60)
Glucose, Bld: 98 mg/dL (ref 70–99)
Phosphorus: 2.8 mg/dL (ref 2.5–4.6)
Potassium: 3.5 mmol/L (ref 3.5–5.1)
Sodium: 138 mmol/L (ref 135–145)

## 2021-08-31 LAB — CBC
HCT: 29.7 % — ABNORMAL LOW (ref 39.0–52.0)
Hemoglobin: 9.5 g/dL — ABNORMAL LOW (ref 13.0–17.0)
MCH: 29.7 pg (ref 26.0–34.0)
MCHC: 32 g/dL (ref 30.0–36.0)
MCV: 92.8 fL (ref 80.0–100.0)
Platelets: 152 10*3/uL (ref 150–400)
RBC: 3.2 MIL/uL — ABNORMAL LOW (ref 4.22–5.81)
RDW: 13.3 % (ref 11.5–15.5)
WBC: 7.8 10*3/uL (ref 4.0–10.5)
nRBC: 0 % (ref 0.0–0.2)

## 2021-08-31 LAB — ACTH STIMULATION, 3 TIME POINTS
Cortisol, 30 Min: 22.6 ug/dL
Cortisol, 60 Min: 27.4 ug/dL
Cortisol, Base: 6.1 ug/dL

## 2021-08-31 MED ORDER — BISMUTH 262 MG PO CHEW: 2.0000 | CHEWABLE_TABLET | Freq: Four times a day (QID) | ORAL | 0 refills | Status: DC

## 2021-08-31 MED ORDER — TETRACYCLINE HCL 500 MG PO CAPS: 500.0000 mg | ORAL_CAPSULE | Freq: Four times a day (QID) | ORAL | 0 refills | Status: AC

## 2021-08-31 MED ORDER — LOSARTAN POTASSIUM 50 MG PO TABS: 50.0000 mg | ORAL_TABLET | Freq: Every day | ORAL | 3 refills | Status: DC

## 2021-08-31 MED ORDER — AMOXICILLIN-POT CLAVULANATE 875-125 MG PO TABS: 1.0000 | ORAL_TABLET | Freq: Two times a day (BID) | ORAL | 0 refills | Status: AC

## 2021-08-31 MED ORDER — METRONIDAZOLE 500 MG PO TABS: 500.0000 mg | ORAL_TABLET | Freq: Three times a day (TID) | ORAL | 0 refills | Status: AC

## 2021-08-31 NOTE — Discharge Instructions (Signed)
1) complete antibiotic regimen for pneumonia and H. pylori infection as prescribed 2)Follow up with Dr. Levon Hedger-- address [address], [address], [place] [address],,ZYYQM Number [phone]--- in 2 to 3 weeks for recheck and reevaluation 3) repeat CBC and BMP blood test in about 1 week with PCP

## 2021-08-31 NOTE — TOC Transition Note (Signed)
Transition of Care [facility]) - CM/SW Discharge Note   Patient Details  Name: Warren Sullivan MRN: [id number] Date of Birth: May 26, 1961  [facility] CM/SW Contact:  Elliot Gault, LCSW Phone Number: 08/31/2021, 12:12 PM   Clinical Narrative:     Pt stable for dc today per MD. Updated Debbie at [facility] and they can accept pt back today. DC clinical sent electronically. RN to call report. EMS arranged.  No other TOC needs for dc.  Final next level of care: Long Term Nursing Home Barriers to Discharge: Barriers Resolved   Patient Goals and CMS Choice Patient states their goals for this hospitalization and ongoing recovery are:: anticipate return to SNF      Discharge Placement                       Discharge Plan and Services In-house Referral: Clinical Social Work   Post Acute Care Choice: Resumption of Svcs/PTA Provider                               Social Determinants of Health (SDOH) Interventions     Readmission Risk Interventions No flowsheet data found.

## 2021-08-31 NOTE — Progress Notes (Signed)
Patient has been stable, no c/o of respiratory issues. Was up in chair earlier in shift.  No new issues at this time.

## 2021-08-31 NOTE — Care Management Important Message (Signed)
Important Message  Patient Details  Name: MAKEL ELTING MRN: [id number] Date of Birth: 29-Nov-1960   Medicare Important Message Given:  N/A - LOS <3 / Initial given by admissions     Tommy Medal 08/31/2021, 11:17 AM

## 2021-08-31 NOTE — Discharge Summary (Addendum)
Warren Sullivan, is a 61 y.o. male  DOB 01-Jun-1961  MRN [id number].  Admission date:  08/28/2021  Admitting Physician  Rolla Plate, DO  Discharge Date:  08/31/2021   Primary MD  Claretta Fraise, MD  Recommendations for primary care physician for things to follow:   -1) complete antibiotic regimen for pneumonia and H. pylori infection as prescribed 2)Follow up with Dr. Jenetta Downer-- address [address], [address], [place] [id number] Number [phone]--- in 2 to 3 weeks for recheck and reevaluation 3) repeat CBC and BMP blood test in about 1 week with PCP  Admission Diagnosis  Pneumonia [J18.9] Community acquired pneumonia, unspecified laterality [J18.9] Sepsis with acute renal failure without septic shock, due to unspecified organism, unspecified acute renal failure type ([place]) [A41.9, R65.20, N17.9]   Discharge Diagnosis  Pneumonia [J18.9] Community acquired pneumonia, unspecified laterality [J18.9] Sepsis with acute renal failure without septic shock, due to unspecified organism, unspecified acute renal failure type ([place]) [A41.9, R65.20, N17.9]    Principal Problem:   Pneumonia Active Problems:   Essential hypertension   Tobacco dependence   AKI (acute kidney injury) ([place])   GERD (gastroesophageal reflux disease)   Hyperlipidemia, unspecified   Fall at home, initial encounter      Past Medical History:  Diagnosis Date   CVA (cerebral vascular accident) ([place])    Dementia in other diseases classified elsewhere, severe, without behavioral disturbance, psychotic disturbance, mood disturbance, and anxiety    GERD (gastroesophageal reflux disease)    Hemiplegia, unspecified affecting right dominant side ([place])    Hyperlipidemia    Hypertension    MDD (major depressive disorder)    Stroke ([place])    Vitamin D deficiency     Past Surgical History:  Procedure Laterality Date    BIOPSY  08/18/2021   Procedure: BIOPSY;  Surgeon: Montez Morita, Quillian Quince, MD;  [facility];  Service: Gastroenterology;;  Trevor Mace and distal esophagus biopsies    ESOPHAGOGASTRODUODENOSCOPY (EGD) WITH PROPOFOL N/A 08/18/2021   Procedure: ESOPHAGOGASTRODUODENOSCOPY (EGD) WITH PROPOFOL;  Surgeon: Harvel Quale, MD;  [facility];  Service: Gastroenterology;  Laterality: N/A;   HERNIA REPAIR       HPI  from the history and physical done on the day of admission:   HPI: Warren Sullivan is a 61 y.o. male with medical history significant of with history of CVA with right-sided residual weakness, GERD,, hyperlipidemia, hypertension, and more presents the ED with a chief complaint of fall.  Unfortunately patient is not able to provide any history and this is his baseline with his dementia.  He is very pleasant, but does not know why he is in the hospital.  When reminded that he had a fall, he reports that he did.  He does not think he lost consciousness or hit his head.  He does not remember having any chest pain.  It is reported the patient was sitting in his chair and he fell out of the chair to the right.  He was  not complaining of any pain afterwards.  He still not complaining of any pain during my exam.  The facility wanted him to have an x-ray of his shoulder to rule out fracture.  When he arrived in the ED he was hypotensive.  Sepsis work-up was done.  Looks like he has a pneumonia.  Patient was given 30 mL/kg bolus.  He was started on cefepime, vancomycin, Flagyl.  Patient does have a recent admission August 20, 2021 was the discharge date.  That was for AKI and small bowel obstruction.  Patient was advised to follow-up with GI, avoid NSAIDs, and follow-up for lab work.  It appears that he has not followed up for lab work yet.  There are also no notes from GI in the system.  Patient has no other complaints at this time, and reports that he feels fine.  He  was put on oxygen in the ED mostly for comfort.  He was hovering around 90-91% on room air, so he was put on 2 L nasal cannula.   Review of Systems: unable to review all systems due to the inability of the patient to answer questions.     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 with medical history significant of prior stroke with Vascular dementia, hypertension, hyperlipidemia,  BPH , tobacco abuse admitted from [facility] on [id number] with concerns about pneumonia   Assessment and Plan: * CAP--Pneumonia- (present on admission) -Treated with IV Rocephin and Zithromax , as well as Bronchodilators, mucolytics as ordered -Clinically much improved, -WBC is down to 7.8 from 21.1 on admission -No hypoxia -Repeat chest x-ray continues to show pneumonia -Okay to discharge on Augmentin   Fall at home, initial encounter Patient does not remember details Likely secondary to generalized weakness secondary to infection/dehydration PT eval appreciated, recommends SNF rehab   aKI-due to dehydration and poor intake -Creatinine is down to 1.1 from 2.77 on admission --Resolved AKI with hydration, Repeat BMP within a week advised renally adjust medications, avoid nephrotoxic agents / dehydration  / hypotension -It is very important that patient continues to take adequate oral intake especially fluids to avoid further dehydration and repeat AKI   History of strokes/vascular Dementia with behavioral disturbance--- stable, continue statin   GERD/H. pylori infection Continue Protonix -Recent biopsies from EGD consistent with H. pylori, -Okay to treat empirically with bismuth, tetracycline and Flagyl as ordered -Follow-up with GI physician Dr. Jenetta Downer in 2 to 3 weeks for recheck and reevaluation   Tobacco dependence-= smoking much less lately while at facility  -Patient is not interested in complete smoking cessation  Essential hypertension- (present on  admission) -AM cortisol was 7.1, -ACTH test ordered and pending--PCP to follow-up on this result -Has improved, okay to restart clonidine, reduce losartan to 50 mg from 100 mg  Chronic Anemia--- Hgb above 9 stable, repeat CBC with PCP within the week   Disposition--discharge back to facility  Disposition: The patient is from: SNF              Anticipated d/c is to: SNF                Code Status :  -  Code Status: Full Code    Family Communication:     -Patient's son is POA -Patient's sisters are very involved in his care  Discharge Condition: stable  Follow UP   Follow-up Information     Claretta Fraise, MD. Schedule an appointment as soon as  possible for a visit in 1 week(s).   Specialty: Family Medicine Why: for CBC and BMP recheck Contact information: [address] [place] [address] [phone]                  Diet and Activity recommendation:  As advised  Discharge Instructions    Discharge Instructions     Call MD for:  difficulty breathing, headache or visual disturbances   Complete by: As directed    Call MD for:  persistant dizziness or light-headedness   Complete by: As directed    Call MD for:  persistant nausea and vomiting   Complete by: As directed    Call MD for:  temperature >100.4   Complete by: As directed    Diet - low sodium heart healthy   Complete by: As directed    Discharge instructions   Complete by: As directed    1) complete antibiotic regimen for pneumonia and H. pylori infection as prescribed 2)Follow up with Dr. Jenetta Downer-- address [address], [address], [place] [id number] Number [phone]--- in 2 to 3 weeks for recheck and reevaluation 3) repeat CBC and BMP blood test in about 1 week with PCP   Increase activity slowly   Complete by: As directed        Discharge Medications     Allergies as of 08/31/2021   No Known Allergies      Medication List     TAKE these medications     acetaminophen 650 MG CR tablet Commonly known as: TYLENOL Take 650 mg by mouth every 8 (eight) hours as needed for pain.   amoxicillin-clavulanate 875-125 MG tablet Commonly known as: Augmentin Take 1 tablet by mouth 2 (two) times daily for 3 days. Start taking on: September 01, 2021   aspirin EC 81 MG tablet Take 1 tablet (81 mg total) by mouth daily with breakfast. Swallow whole.   atorvastatin 20 MG tablet Commonly known as: LIPITOR Take 1 tablet (20 mg total) by mouth daily.   Bismuth 262 MG Chew Chew 2 each by mouth every 6 (six) hours.   CALCIUM 500 PO Take 1 tablet by mouth daily.   cloNIDine 0.1 MG tablet Commonly known as: CATAPRES Take 2 tablets (0.2 mg total) by mouth 2 (two) times daily.   FLUoxetine 20 MG capsule Commonly known as: PROZAC Take 20 mg by mouth daily.   gabapentin 300 MG capsule Commonly known as: NEURONTIN Take 1 capsule (300 mg total) by mouth 3 (three) times daily. Start with one at bedtime for 1 week. Then two at bedtime for one week, then go to full dose.   losartan 50 MG tablet Commonly known as: COZAAR Take 1 tablet (50 mg total) by mouth daily. What changed:  medication strength how much to take   memantine 10 MG tablet Commonly known as: NAMENDA Take 10 mg by mouth 2 (two) times daily.   metroNIDAZOLE 500 MG tablet Commonly known as: FLAGYL Take 1 tablet (500 mg total) by mouth 3 (three) times daily for 14 days.   multivitamin tablet Take 1 tablet by mouth daily.   nicotine 14 mg/24hr patch Commonly known as: NICODERM CQ - dosed in mg/24 hours Place 1 patch (14 mg total) onto the skin daily.   pantoprazole 40 MG tablet Commonly known as: Protonix Take 1 tablet (40 mg total) by mouth 2 (two) times daily.   tamsulosin 0.4 MG Caps capsule Commonly known as: FLOMAX Take 0.4 mg by  mouth daily.   tetracycline 500 MG capsule Commonly known as: SUMYCIN Take 1 capsule (500 mg total) by mouth 4 (four) times daily for 14  days.   Vascepa 1 g capsule Generic drug: icosapent Ethyl Take 2 g by mouth 2 (two) times daily.   vitamin B-12 500 MCG tablet Commonly known as: CYANOCOBALAMIN Take 500 mcg by mouth daily.   Vitamin D3 25 MCG tablet Commonly known as: Vitamin D Take 5,000 Units by mouth daily.        Major procedures and Radiology Reports - PLEASE review detailed and final reports for all details, in brief -   DG Chest 2 View  Result Date: 08/31/2021 CLINICAL DATA:  Shortness of breath for few days EXAM: CHEST - 2 VIEW COMPARISON:  08/28/2021 FINDINGS: Bilateral patchy lower lobe airspace disease concerning for multilobar pneumonia. No right pleural effusion. Trace left pleural effusion. No pneumothorax. Stable cardiomediastinal silhouette. No aggressive osseous lesion. IMPRESSION: 1. Bilateral patchy lower lobe airspace disease concerning for multilobar pneumonia. Electronically Signed   By: Kathreen Devoid M.D.   On: 08/31/2021 10:53   DG Chest 2 View  Result Date: 08/28/2021 CLINICAL DATA:  [address] out of wheelchair EXAM: CHEST - 2 VIEW COMPARISON:  08/18/2021 FINDINGS: Patchy airspace disease in the lower lungs. No pleural effusion. Stable cardiomediastinal silhouette with aortic atherosclerosis. IMPRESSION: Patchy lower lung airspace disease concerning for pneumonia. Radiographic follow-up to resolution is recommended. Electronically Signed   By: Donavan Foil M.D.   On: 08/28/2021 22:20   CT CHEST W CONTRAST  Result Date: 08/19/2021 CLINICAL DATA:  61 year old male with history of esophageal mass. EXAM: CT CHEST WITH CONTRAST TECHNIQUE: Multidetector CT imaging of the chest was performed during intravenous contrast administration. RADIATION DOSE REDUCTION: This exam was performed according to the departmental dose-optimization program which includes automated exposure control, adjustment of the mA and/or kV according to patient size and/or use of iterative reconstruction technique. CONTRAST:  34mL  OMNIPAQUE IOHEXOL 300 MG/ML  SOLN COMPARISON:  No priors. FINDINGS: Cardiovascular: Heart size is normal. There is no significant pericardial fluid, thickening or pericardial calcification. There is aortic atherosclerosis, as well as atherosclerosis of the great vessels of the mediastinum and the coronary arteries, including calcified atherosclerotic plaque in the left main, left anterior descending, left circumflex and right coronary arteries. Aneurysmal dilatation of the ascending thoracic aorta (4.8 cm in diameter). Mediastinum/Nodes: Prominent borderline enlarged distal middle mediastinal lymph nodes measuring up to 11 mm in short axis (axial image 98 of series 2). These are adjacent to the patient's moderate-sized hiatal hernia. Immediately proximal to the hernia in the distal esophagus there is a mass-like area of soft tissue thickening measuring approximately 4.3 x 2.6 cm, concerning for potential neoplasm (axial image 98 of series 2). No axillary lymphadenopathy. Lungs/Pleura: Calcified pleural plaques in the left hemithorax. No right-sided calcified pleural plaques are noted. 7 x 5 mm (mean diameter of 6 mm) right upper lobe pulmonary nodule (axial image 54 of series 4). No other larger more suspicious appearing pulmonary nodules or masses are noted. No acute consolidative airspace disease. No pleural effusions. Chronic scarring in the base of the left lung, likely related to remote infection or trauma. Upper Abdomen: Aortic atherosclerosis. High attenuation material within the lumen of the gallbladder, new compared to the recent prior examination, presumably vicarious excretion of contrast material from the prior contrast enhanced CT examination. Musculoskeletal: There are no aggressive appearing lytic or blastic lesions noted in the visualized portions of the skeleton. IMPRESSION:  1. Mass-like thickening of the distal esophagus just before the gastroesophageal junction concerning for potential neoplasm.  Prominent borderline enlarged middle mediastinal lymph nodes are noted adjacent to this. Correlation with results of recent biopsy are recommended to exclude neoplasm. 2. 7 x 5 mm (mean diameter of 6 mm) right upper lobe pulmonary nodule. This is nonspecific. Attention on follow-up studies is recommended to ensure stability or regression, however, particularly in light of the potential esophageal neoplasm. 3. Aortic atherosclerosis, in addition to left main and three-vessel coronary artery disease. Please note that although the presence of coronary artery calcium documents the presence of coronary artery disease, the severity of this disease and any potential stenosis cannot be assessed on this non-gated CT examination. Assessment for potential risk factor modification, dietary therapy or pharmacologic therapy may be warranted, if clinically indicated. 4. There is also aneurysmal dilatation of the ascending thoracic aorta (4.8 cm in diameter). Ascending thoracic aortic aneurysm. Recommend semi-annual imaging followup by CTA or MRA and referral to cardiothoracic surgery if not already obtained. This recommendation follows 2010 ACCF/AHA/AATS/ACR/ASA/SCA/SCAI/SIR/STS/SVM Guidelines for the Diagnosis and Management of Patients With Thoracic Aortic Disease. Circulation. 2010; [id number]. Aortic aneurysm NOS (ICD10-I71.9). Aortic Atherosclerosis (ICD10-I70.0) and aortic aneurysm NOS (ICD10-I71.9). Electronically Signed   By: Vinnie Langton M.D.   On: 08/19/2021 14:09   CT ABDOMEN PELVIS W CONTRAST  Result Date: 08/17/2021 CLINICAL DATA:  Acute nonlocalized abdominal pain with distended abdomen and vomiting. EXAM: CT ABDOMEN AND PELVIS WITH CONTRAST TECHNIQUE: Multidetector CT imaging of the abdomen and pelvis was performed using the standard protocol following bolus administration of intravenous contrast. RADIATION DOSE REDUCTION: This exam was performed according to the departmental dose-optimization program  which includes automated exposure control, adjustment of the mA and/or kV according to patient size and/or use of iterative reconstruction technique. CONTRAST:  38mL OMNIPAQUE IOHEXOL 300 MG/ML  SOLN COMPARISON:  None. FINDINGS: Lower chest: Atelectasis or infiltration in the lung bases. Mild cardiac enlargement with small pericardial effusion. Small esophageal hiatal hernia. Thick walled lower esophagus may represent reflux disease, esophagitis, or possibly an esophageal mass. Prominent paraesophageal lymph nodes. Hepatobiliary: No focal liver abnormality is seen. No gallstones, gallbladder wall thickening, or biliary dilatation. Pancreas: Unremarkable. No pancreatic ductal dilatation or surrounding inflammatory changes. Spleen: Normal in size without focal abnormality. Adrenals/Urinary Tract: Adrenal glands are unremarkable. Kidneys are normal, without renal calculi, focal lesion, or hydronephrosis. Bladder is unremarkable. Stomach/Bowel: Mildly dilated fluid-filled small bowel with multiple air-fluid levels. The colon is decompressed. Terminal ileum also appears decompressed. Mild small bowel wall thickening. Vascular/Lymphatic: Extensive aortoiliac calcifications. No aneurysm. No significant lymphadenopathy. Reproductive: Mild prostate enlargement. Other: Small periumbilical hernia containing fat. No free air or free fluid. Musculoskeletal: Degenerative changes in the spine. IMPRESSION: 1. Dilated fluid-filled small bowel with air-fluid levels, bowel wall thickening, and decompressed terminal ileum. Changes are likely small bowel obstruction but could indicate enteritis. 2. Small esophageal hiatal hernia. Marked wall thickening of the esophagus with paraesophageal lymphadenopathy. Changes could represent reflux or esophagitis but are worrisome for esophageal mass. The area is incompletely visualized. Suggest chest CT for further evaluation. 3. Infiltration or atelectasis in the lung bases. 4. Extensive aortic  atherosclerosis. 5. Prostate gland is enlarged. Electronically Signed   By: Lucienne Capers M.D.   On: 08/17/2021 21:58   DG CHEST PORT 1 VIEW  Result Date: 08/18/2021 CLINICAL DATA:  Vomiting, NG tube placement EXAM: PORTABLE CHEST 1 VIEW COMPARISON:  02/19/2013 FINDINGS: Blunting of the left costophrenic angle, better evaluated on CT,  reflecting pleural thickening with calcifications. Lungs are otherwise clear. No definite pleural effusion or pneumothorax. The heart is normal in size.  Thoracic aortic atherosclerosis. Enteric tube coursing into the stomach. IMPRESSION: No evidence of acute cardiopulmonary disease. Enteric tube coursing into the stomach. Electronically Signed   By: Charline Bills M.D.   On: 08/18/2021 02:51   DG Shoulder Left  Result Date: 08/28/2021 CLINICAL DATA:  Fall today.  Rule out fracture. EXAM: LEFT SHOULDER - 2+ VIEW COMPARISON:  None. FINDINGS: Osseous alignment is normal. No fracture line or displaced fracture fragment is seen. No significant degenerative change. Soft tissues about the LEFT shoulder are unremarkable. IMPRESSION: Negative. Electronically Signed   By: Bary Richard M.D.   On: 08/28/2021 21:22    Micro Results   Recent Results (from the past 240 hour(s))  Blood culture (routine x 2)     Status: None (Preliminary result)   Collection Time: 08/28/21 10:21 PM   Specimen: BLOOD  Result Value Ref Range Status   Specimen Description BLOOD RIGHT ANTECUBITAL  Final   Special Requests   Final    BOTTLES DRAWN AEROBIC AND ANAEROBIC Blood Culture adequate volume   Culture   Final    NO GROWTH 3 DAYS Performed at [facility], [address] [place], [place] [address]    Report Status PENDING  Incomplete  Blood culture (routine x 2)     Status: None (Preliminary result)   Collection Time: 08/28/21 10:21 PM   Specimen: BLOOD  Result Value Ref Range Status   Specimen Description BLOOD LEFT ANTECUBITAL  Final   Special Requests   Final    BOTTLES DRAWN  AEROBIC AND ANAEROBIC Blood Culture results may not be optimal due to an excessive volume of blood received in culture bottles   Culture   Final    NO GROWTH 3 DAYS Performed at [facility], [address], [place], [place] [address]    Report Status PENDING  Incomplete  Resp Panel by RT-PCR (Flu A&B, Covid) Nasopharyngeal Swab     Status: None   Collection Time: 08/28/21 10:33 PM   Specimen: Nasopharyngeal Swab; Nasopharyngeal(NP) swabs in vial transport medium  Result Value Ref Range Status   SARS Coronavirus 2 by RT PCR NEGATIVE NEGATIVE Final    Comment: (NOTE) SARS-CoV-2 target nucleic acids are NOT DETECTED.  The SARS-CoV-2 RNA is generally detectable in upper respiratory specimens during the acute phase of infection. The lowest concentration of SARS-CoV-2 viral copies this assay can detect is 138 copies/mL. A negative result does not preclude SARS-Cov-2 infection and should not be used as the sole basis for treatment or other patient management decisions. A negative result may occur with  improper specimen collection/handling, submission of specimen other than nasopharyngeal swab, presence of viral mutation(s) within the areas targeted by this assay, and inadequate number of viral copies(<138 copies/mL). A negative result must be combined with clinical observations, patient history, and epidemiological information. The expected result is Negative.  Fact Sheet for Patients:  BloggerCourse.com  Fact Sheet for Healthcare Providers:  SeriousBroker.it  This test is no t yet approved or cleared by the [place] FDA and  has been authorized for detection and/or diagnosis of SARS-CoV-2 by FDA under an Emergency Use Authorization (EUA). This EUA will remain  in effect (meaning this test can be used) for the duration of the COVID-19 declaration under Section 564(b)(1) of the Act, 21 U.S.C.section 360bbb-3(b)(1), unless the  authorization is terminated  or revoked sooner.  Influenza A by PCR NEGATIVE NEGATIVE Final   Influenza B by PCR NEGATIVE NEGATIVE Final    Comment: (NOTE) The Xpert Xpress SARS-CoV-2/FLU/RSV plus assay is intended as an aid in the diagnosis of influenza from Nasopharyngeal swab specimens and should not be used as a sole basis for treatment. Nasal washings and aspirates are unacceptable for Xpert Xpress SARS-CoV-2/FLU/RSV testing.  Fact Sheet for Patients: EntrepreneurPulse.com.au  Fact Sheet for Healthcare Providers: IncredibleEmployment.be  This test is not yet approved or cleared by the [place] FDA and has been authorized for detection and/or diagnosis of SARS-CoV-2 by FDA under an Emergency Use Authorization (EUA). This EUA will remain in effect (meaning this test can be used) for the duration of the COVID-19 declaration under Section 564(b)(1) of the Act, 21 U.S.C. section 360bbb-3(b)(1), unless the authorization is terminated or revoked.  Performed at [facility], [address], [place], [place] [address]   MRSA Next Gen by PCR, Nasal     Status: None   Collection Time: 08/29/21  4:55 AM   Specimen: Nasal Mucosa; Nasal Swab  Result Value Ref Range Status   MRSA by PCR Next Gen NOT DETECTED NOT DETECTED Final    Comment: (NOTE) The GeneXpert MRSA Assay (FDA approved for NASAL specimens only), is one component of a comprehensive MRSA colonization surveillance program. It is not intended to diagnose MRSA infection nor to guide or monitor treatment for MRSA infections. Test performance is not FDA approved in patients less than 23 years old. Performed at [facility], [address] [place], [place] [address]     Today   Subjective    Warren Sullivan today has no new complaints, -No hypoxia, no shortness of breath at rest, -Patient is not a great historian          Patient has been seen and examined prior to  discharge   Objective   Blood pressure (!) 145/91, pulse 65, temperature 97.9 F (36.6 C), resp. rate 17, height 5\' 9"  (1.753 m), weight 100 kg, SpO2 97 %.   Intake/Output Summary (Last 24 hours) at 08/31/2021 1201 Last data filed at 08/31/2021 [id number] Gross per 24 hour  Intake 1200 ml  Output 1450 ml  Net -250 ml    Exam Gen:- Awake Alert, in no acute distress HEENT:- [place].AT, No sclera icterus Neck-Supple Neck,No JVD,.  Lungs-improved air movement, no wheezing CV- S1, S2 normal, regular  Abd-  +ve B.Sounds, Abd Soft, No tenderness,    Extremity/Skin:- No  edema, pedal pulses present  Psych- baseline cognitive and memory deficits noted, less disoriented neuro-generalized weakness, no new focal deficits, no tremors   Data Review   CBC w Diff:  Lab Results  Component Value Date   WBC 7.8 08/31/2021   HGB 9.5 (L) 08/31/2021   HGB 13.8 10/19/2015   HCT 29.7 (L) 08/31/2021   HCT 41.0 10/19/2015   PLT 152 08/31/2021   PLT 125 (L) 10/19/2015   LYMPHOPCT 11 08/29/2021   MONOPCT 4 08/29/2021   EOSPCT 1 08/29/2021   BASOPCT 0 08/29/2021    CMP:  Lab Results  Component Value Date   NA 138 08/31/2021   NA 137 10/19/2015   K 3.5 08/31/2021   CL 109 08/31/2021   CO2 23 08/31/2021   BUN 15 08/31/2021   BUN 19 10/19/2015   CREATININE 1.14 08/31/2021   PROT 5.5 (L) 08/29/2021   PROT 7.3 10/19/2015   ALBUMIN 2.5 (L) 08/31/2021   ALBUMIN 4.0 10/19/2015   BILITOT 0.7  08/29/2021   BILITOT 0.3 10/19/2015   ALKPHOS 46 08/29/2021   AST 22 08/29/2021   ALT 13 08/29/2021  .  Total Discharge time is about 33 minutes  Roxan Hockey M.D on 08/31/2021 at 12:01 PM  Go to [email or web address] -  for contact info  [facility] - Office  [phone]

## 2021-09-02 LAB — CULTURE, BLOOD (ROUTINE X 2)
Culture: NO GROWTH
Culture: NO GROWTH
Special Requests: ADEQUATE

## 2021-09-14 ENCOUNTER — Encounter (HOSPITAL_COMMUNITY): Payer: Self-pay | Admitting: Radiology

## 2021-09-26 NOTE — Patient Instructions (Signed)
? ?   Marina Gravel ? 09/26/2021  ?  ? @PREFPERIOPPHARMACY @ ? ? Your procedure is scheduled on  10/03/2021. ? ? Report to 10/05/2021 at  0845 A.M. ? ? Call this number if you have problems the morning of surgery: ? [phone] ? ? Remember: ? Follow the diet instructions given to you by the office. ?  ? Take these medicines the morning of surgery with A SIP OF WATER  ? ?                            catapress, prozac, gabapentin, namenda, protonix, flomax. ?  ? ? ? Do not wear jewelry, make-up or nail polish. ? Do not wear lotions, powders, or perfumes, or deodorant. ? Do not shave 48 hours prior to surgery.  Men may shave face and neck. ? Do not bring valuables to the hospital. ? [facility] is not responsible for any belongings or valuables. ? ?Contacts, dentures or bridgework may not be worn into surgery.  Leave your suitcase in the car.  After surgery it may be brought to your room. ? ?For patients admitted to the hospital, discharge time will be determined by your treatment team. ? ?Patients discharged the day of surgery will not be allowed to drive home and must have someone with them for 24 hours.  ? ? ?Special instructions:    Do not smoke tobacco or vape for 24 hours before your procedure ? ?Please read over the following fact sheets that you were given. ?Anesthesia Post-op Instructions ?  ? ? ?  ?

## 2021-09-28 ENCOUNTER — Encounter (HOSPITAL_COMMUNITY)
Admission: RE | Admit: 2021-09-28 | Discharge: 2021-09-28 | Disposition: A | Discharge status: Home / Self Care | Payer: Medicare Other | Source: Ambulatory Visit | Attending: Gastroenterology | Admitting: Gastroenterology

## 2021-09-28 ENCOUNTER — Other Ambulatory Visit: Payer: Self-pay

## 2021-09-28 ENCOUNTER — Encounter (HOSPITAL_COMMUNITY): Payer: Self-pay

## 2021-09-28 NOTE — Pre-Procedure Instructions (Signed)
Spoke with Interior and spatial designer at [place]. About preop instructions. Also faxed a copy to her. Called son, Nitai Conser, son, [place] and spoke with wife, who will call us back to let us know is POA will be here for procedure of if we need to get phone consent. ?

## 2021-10-03 ENCOUNTER — Ambulatory Visit (HOSPITAL_COMMUNITY): Payer: Medicare Other | Admitting: Anesthesiology

## 2021-10-03 ENCOUNTER — Encounter (HOSPITAL_COMMUNITY): Payer: Self-pay | Admitting: Gastroenterology

## 2021-10-03 ENCOUNTER — Ambulatory Visit (HOSPITAL_BASED_OUTPATIENT_CLINIC_OR_DEPARTMENT_OTHER): Payer: Medicare Other | Admitting: Anesthesiology

## 2021-10-03 ENCOUNTER — Ambulatory Visit (HOSPITAL_COMMUNITY)
Admission: RE | Admit: 2021-10-03 | Discharge: 2021-10-03 | Disposition: A | Discharge status: Skilled Nursing Facility | Payer: Medicare Other | Attending: Gastroenterology | Admitting: Gastroenterology

## 2021-10-03 ENCOUNTER — Encounter (HOSPITAL_COMMUNITY)
Admission: RE | Disposition: A | Discharge status: Skilled Nursing Facility | Payer: Self-pay | Source: Home / Self Care | Attending: Gastroenterology

## 2021-10-03 DIAGNOSIS — K2100 Gastro-esophageal reflux disease with esophagitis, without bleeding: Secondary | ICD-10-CM | POA: Diagnosis present

## 2021-10-03 DIAGNOSIS — I10 Essential (primary) hypertension: Secondary | ICD-10-CM | POA: Diagnosis not present

## 2021-10-03 DIAGNOSIS — Z8673 Personal history of transient ischemic attack (TIA), and cerebral infarction without residual deficits: Secondary | ICD-10-CM | POA: Insufficient documentation

## 2021-10-03 DIAGNOSIS — E785 Hyperlipidemia, unspecified: Secondary | ICD-10-CM | POA: Insufficient documentation

## 2021-10-03 DIAGNOSIS — K3189 Other diseases of stomach and duodenum: Secondary | ICD-10-CM

## 2021-10-03 DIAGNOSIS — K449 Diaphragmatic hernia without obstruction or gangrene: Secondary | ICD-10-CM

## 2021-10-03 DIAGNOSIS — K2289 Other specified disease of esophagus: Secondary | ICD-10-CM | POA: Diagnosis not present

## 2021-10-03 DIAGNOSIS — N400 Enlarged prostate without lower urinary tract symptoms: Secondary | ICD-10-CM | POA: Diagnosis not present

## 2021-10-03 DIAGNOSIS — K2980 Duodenitis without bleeding: Secondary | ICD-10-CM

## 2021-10-03 DIAGNOSIS — F32A Depression, unspecified: Secondary | ICD-10-CM | POA: Insufficient documentation

## 2021-10-03 DIAGNOSIS — F02C0 Dementia in other diseases classified elsewhere, severe, without behavioral disturbance, psychotic disturbance, mood disturbance, and anxiety: Secondary | ICD-10-CM | POA: Diagnosis not present

## 2021-10-03 DIAGNOSIS — F17200 Nicotine dependence, unspecified, uncomplicated: Secondary | ICD-10-CM | POA: Insufficient documentation

## 2021-10-03 DIAGNOSIS — K2950 Unspecified chronic gastritis without bleeding: Secondary | ICD-10-CM | POA: Insufficient documentation

## 2021-10-03 HISTORY — PX: ESOPHAGOGASTRODUODENOSCOPY (EGD) WITH PROPOFOL: SHX5813

## 2021-10-03 HISTORY — PX: BIOPSY: SHX5522

## 2021-10-03 SURGERY — ESOPHAGOGASTRODUODENOSCOPY (EGD) WITH PROPOFOL
Anesthesia: General

## 2021-10-03 MED ORDER — PROPOFOL 500 MG/50ML IV EMUL
INTRAVENOUS | Status: DC | PRN
  Administered 2021-10-03: 150 ug/kg/min via INTRAVENOUS

## 2021-10-03 MED ORDER — LACTATED RINGERS IV SOLN: INTRAVENOUS | Status: DC

## 2021-10-03 NOTE — Transfer of Care (Signed)
Immediate Anesthesia Transfer of Care Note ? ?Patient: Warren Sullivan ? ?Procedure(s) Performed: ESOPHAGOGASTRODUODENOSCOPY (EGD) WITH PROPOFOL ?BIOPSY ? ?Patient Location: PACU ? ?Anesthesia Type:General ? ?Level of Consciousness: awake and patient cooperative ? ?Airway & Oxygen Therapy: Patient Spontanous Breathing ? ?Post-op Assessment: Report given to RN, Post -op Vital signs reviewed and stable and Patient moving all extremities X 4 ? ?Post vital signs: Reviewed and stable ? ?Last Vitals:  ?Vitals Value Taken Time  ?BP    ?Temp    ?Pulse    ?Resp    ?SpO2    ? ? ?Last Pain:  ?Vitals:  ? 10/03/21 1030  ?TempSrc:   ?PainSc: 0-No pain  ?   ? ?  ? ?Complications: No notable events documented. ?

## 2021-10-03 NOTE — Discharge Instructions (Addendum)
You are being discharged to home.  ?Resume your previous diet.  ?We are waiting for your pathology results. Marland Kitchen ?Continue with pantoprazole 40 mg twice a day. ? ? ?

## 2021-10-03 NOTE — Anesthesia Postprocedure Evaluation (Signed)
Anesthesia Post Note ? ?Patient: [name] ? ?Procedure(s) Performed: ESOPHAGOGASTRODUODENOSCOPY (EGD) WITH PROPOFOL ?BIOPSY ? ?Patient location during evaluation: Phase II ?Anesthesia Type: General ?Level of consciousness: awake ?Pain management: pain level controlled ?Vital Signs Assessment: post-procedure vital signs reviewed and stable ?Respiratory status: spontaneous breathing and respiratory function stable ?Cardiovascular status: blood pressure returned to baseline and stable ?Postop Assessment: no headache and no apparent nausea or vomiting ?Anesthetic complications: no ?Comments: Late entry ? ? ?No notable events documented. ? ? ?Last Vitals:  ?Vitals:  ? 10/03/21 0856 10/03/21 1058  ?BP: (!) 144/73 97/70  ?Pulse: (!) 50 (!) 59  ?Resp: (!) 99 13  ?Temp: 36.6 ?C 36.5 ?C  ?SpO2: 99% 98%  ?  ?Last Pain:  ?Vitals:  ? 10/03/21 1058  ?TempSrc: Axillary  ?PainSc: 0-No pain  ? ? ?  ?  ?  ?  ?  ?  ? ?Warren Sullivan ? ? ? ? ?

## 2021-10-03 NOTE — Progress Notes (Signed)
Warren Sullivan was here with family today, 10/03/21.  ?

## 2021-10-03 NOTE — H&P (Signed)
Warren Sullivan is an 61 y.o. male.   ?Chief Complaint: Follow up esophagitis ?HPI:  62 year old male with past medical history of CVA, hypertension, hyperlipidemia, dementia, BPH and recent hospitalization due to hematemesis, where he was found to have significant esophageal thickening concerning for esophageal malignancy given presence of paraesophageal lymph nodes. ? ?Patient underwent EGD on 08/18/21 with the following findings: ?Diffuse mucosal changes characterized by congestion and discoloration with some scant exudates were found in the distal esophagus. Biopsies were taken with a cold forceps for histology. ?A 3 cm hiatal hernia was present. ?Localized mucosal changes characterized by discoloration and depression were found in the cardia - this ascended to the GE junction. Imaging was performed using white light and narrow band imaging to visualize the mucosa. Biopsies were taken with a cold forceps for histology. ?A few localized small erosions with no stigmata of recent bleeding were found in the gastric antrum. ?Biopsies were taken with a cold forceps for Helicobacter pylori testing. ?Localized severely erythematous mucosa without active bleeding was found in the duodenal bulb. ? ?Path shwoed ?A. DUODENUM, BIOPSY:  ?- Active chronic duodenitis with surface gastric foveolar metaplasia,  ?consistent with peptic duodenitis.  See comment  ? ?B. STOMACH, BIOPSY:  ?- Gastric antral and oxyntic mucosa with active chronic gastritis.  See  ?comment  ? ?C. CARDIA, BIOPSY:  ?- Esophageal squamous and cardiac mucosa with active chronic nonspecific  ?carditis  ?- Negative for intestinal metaplasia, dysplasia or malignancy  ? ?D. ESOPHAGUS, DISTAL, BIOPSY:  ?- Esophageal squamous and cardiac mucosa showing severe acute  ?nonspecific esophagitis with ulceration and reactive hyperplasia  ?- Negative for intestinal metaplasia, dysplasia or malignancy  ? ?Staining was positive for H. Pylori. Was prescribed bismuth  quadruple therapy for H. Pylori. ? ?Past Medical History:  ?Diagnosis Date  ? CVA (cerebral vascular accident) [facility])   ? Dementia in other diseases classified elsewhere, severe, without behavioral disturbance, psychotic disturbance, mood disturbance, and anxiety   ? GERD (gastroesophageal reflux disease)   ? Hemiplegia, unspecified affecting right dominant side (HCC)   ? Hyperlipidemia   ? Hypertension   ? MDD (major depressive disorder)   ? Stroke [facility])   ? Vitamin D deficiency   ? ? ?Past Surgical History:  ?Procedure Laterality Date  ? BIOPSY  08/18/2021  ? Procedure: BIOPSY;  Surgeon: Marguerita Merles, Reuel Boom, MD;  Location: [facility];  Service: Gastroenterology;;  Bonnielee Haff and distal esophagus biopsies   ? ESOPHAGOGASTRODUODENOSCOPY (EGD) WITH PROPOFOL N/A 08/18/2021  ? Procedure: ESOPHAGOGASTRODUODENOSCOPY (EGD) WITH PROPOFOL;  Surgeon: Dolores Frame, MD;  Location: [facility];  Service: Gastroenterology;  Laterality: N/A;  ? HERNIA REPAIR    ? ? ?Family History  ?Problem Relation Age of Onset  ? Hypertension Mother   ? Stroke Mother   ? Hypertension Father   ? Hypertension Sister   ? ?Social History:  reports that he has been smoking. He does not have any smokeless tobacco history on file. He reports that he does not drink alcohol and does not use drugs. ? ?Allergies: No Known Allergies ? ?Medications Prior to Admission  ?Medication Sig Dispense Refill  ? cloNIDine (CATAPRES) 0.1 MG tablet Take 2 tablets (0.2 mg total) by mouth 2 (two) times daily. 60 tablet 3  ? FLUoxetine (PROZAC) 20 MG capsule Take 20 mg by mouth daily.    ? gabapentin (NEURONTIN) 300 MG capsule Take 1 capsule (300 mg total) by mouth 3 (three) times daily. Start with one at bedtime for  1 week. Then two at bedtime for one week, then go to full dose. 90 capsule 5  ? losartan (COZAAR) 50 MG tablet Take 1 tablet (50 mg total) by mouth daily. 30 tablet 3  ? memantine (NAMENDA) 10 MG tablet Take 10 mg by mouth 2 (two)  times daily.    ? pantoprazole (PROTONIX) 40 MG tablet Take 1 tablet (40 mg total) by mouth 2 (two) times daily. 60 tablet 3  ? tamsulosin (FLOMAX) 0.4 MG CAPS capsule Take 0.4 mg by mouth daily.    ? VASCEPA 1 g capsule Take 2 g by mouth 2 (two) times daily.    ? acetaminophen (TYLENOL) 650 MG CR tablet Take 650 mg by mouth every 8 (eight) hours as needed for pain.    ? aspirin EC 81 MG tablet Take 1 tablet (81 mg total) by mouth daily with breakfast. Swallow whole. 30 tablet 11  ? atorvastatin (LIPITOR) 20 MG tablet Take 1 tablet (20 mg total) by mouth daily. 30 tablet 3  ? Bismuth 262 MG CHEW Chew 2 each by mouth every 6 (six) hours. 112 tablet 0  ? Calcium Carbonate (CALCIUM 500 PO) Take 1 tablet by mouth daily.    ? Multiple Vitamin (MULTIVITAMIN) tablet Take 1 tablet by mouth daily.    ? nicotine (NICODERM CQ - DOSED IN MG/24 HOURS) 14 mg/24hr patch Place 1 patch (14 mg total) onto the skin daily. 28 patch 0  ? vitamin B-12 (CYANOCOBALAMIN) 500 MCG tablet Take 500 mcg by mouth daily.    ? Vitamin D3 (VITAMIN D) 25 MCG tablet Take 5,000 Units by mouth daily.    ? ? ?No results found for this or any previous visit (from the past 48 hour(s)). ?No results found. ? ?Review of Systems  ?Constitutional: Negative.   ?HENT: Negative.    ?Eyes: Negative.   ?Respiratory: Negative.    ?Cardiovascular: Negative.   ?Gastrointestinal: Negative.   ?Endocrine: Negative.   ?Genitourinary: Negative.   ?Musculoskeletal: Negative.   ?Skin: Negative.   ?Allergic/Immunologic: Negative.   ?Neurological: Negative.   ?Hematological: Negative.   ?Psychiatric/Behavioral: Negative.    ? ?Blood pressure (!) 144/73, pulse (!) 50, temperature 97.8 ?F (36.6 ?C), temperature source Oral, resp. rate (!) 99, height 5\' 9"  (1.753 m), weight 100 kg, SpO2 99 %. ?Physical Exam  ?GENERAL: The patient is Alert, in no acute distress. ?HEENT: Head is normocephalic and atraumatic. EOMI are intact. Mouth is well hydrated and without lesions. ?NECK: Supple.  No masses ?LUNGS: Clear to auscultation. No presence of rhonchi/wheezing/rales. Adequate chest expansion ?HEART: RRR, normal s1 and s2. ?ABDOMEN: Soft, nontender, no guarding, no peritoneal signs, and nondistended. BS +. No masses. ?EXTREMITIES: Without any cyanosis, clubbing, rash, lesions or edema. ?SKIN: no jaundice, no rashes ? ?Assessment/Plan ?61 year old male with past medical history of CVA, hypertension, hyperlipidemia, dementia, BPH and recent hospitalization due to hematemesis, where he was found to have significant esophageal thickening concerning for esophageal malignancy given presence of paraesophageal lymph nodes.  We will proceed with EGD. ? ?68, MD ?10/03/2021, 9:08 AM ? ? ? ?

## 2021-10-03 NOTE — Anesthesia Preprocedure Evaluation (Signed)
Anesthesia Evaluation  ?Patient identified by MRN, date of birth, ID band ?Patient awake ? ? ? ?Reviewed: ?Allergy & Precautions, H&P , NPO status , Patient's Chart, lab work & pertinent test results, reviewed documented beta blocker date and time  ? ?Airway ?Mallampati: II ? ?TM Distance: >3 FB ?Neck ROM: full ? ? ? Dental ?no notable dental hx. ? ?  ?Pulmonary ?neg pulmonary ROS, Current Smoker,  ?  ?Pulmonary exam normal ?breath sounds clear to auscultation ? ? ? ? ? ? Cardiovascular ?Exercise Tolerance: Good ?hypertension, negative cardio ROS ? ? ?Rhythm:regular Rate:Normal ? ? ?  ?Neuro/Psych ?PSYCHIATRIC DISORDERS Depression Dementia negative neurological ROS ?   ? GI/Hepatic ?Neg liver ROS, GERD  Medicated,  ?Endo/Other  ?negative endocrine ROS ? Renal/GU ?negative Renal ROS  ?negative genitourinary ?  ?Musculoskeletal ? ? Abdominal ?  ?Peds ? Hematology ?negative hematology ROS ?(+)   ?Anesthesia Other Findings ? ? Reproductive/Obstetrics ?negative OB ROS ? ?  ? ? ? ? ? ? ? ? ? ? ? ? ? ?  ?  ? ? ? ? ? ? ? ? ?Anesthesia Physical ? ?Anesthesia Plan ? ?ASA: 4 and emergent ? ?Anesthesia Plan: General  ? ?Post-op Pain Management:   ? ?Induction:  ? ?PONV Risk Score and Plan: Propofol infusion ? ?Airway Management Planned:  ? ?Additional Equipment:  ? ?Intra-op Plan:  ? ?Post-operative Plan:  ? ?Informed Consent: I have reviewed the patients History and Physical, chart, labs and discussed the procedure including the risks, benefits and alternatives for the proposed anesthesia with the patient or authorized representative who has indicated his/her understanding and acceptance.  ? ? ? ?Dental Advisory Given ? ?Plan Discussed with: CRNA ? ?Anesthesia Plan Comments:   ? ? ? ? ? ? ?Anesthesia Quick Evaluation ? ?

## 2021-10-03 NOTE — Op Note (Signed)
[facility] ?Patient Name: Warren Sullivan ?Procedure Date: 10/03/2021 10:24 AM ?MRN: [id number] ?Date of Birth: Jun 18, 1961 ?Attending MD: Katrinka Blazing ,  ?CSN: [id number] ?Age: 61 ?Admit Type: Outpatient ?Procedure:                Upper GI endoscopy ?Indications:              Follow-up of reflux esophagitis ?Providers:                Katrinka Blazing, Crystal Page, Burke Keels,  ?                          Technician ?Referring MD:              ?Medicines:                Monitored Anesthesia Care ?Complications:            No immediate complications. ?Estimated Blood Loss:     Estimated blood loss: none. ?Procedure:                Pre-Anesthesia Assessment: ?                          - Prior to the procedure, a History and Physical  ?                          was performed, and patient medications, allergies  ?                          and sensitivities were reviewed. The patient's  ?                          tolerance of previous anesthesia was reviewed. ?                          - The risks and benefits of the procedure and the  ?                          sedation options and risks were discussed with the  ?                          patient. All questions were answered and informed  ?                          consent was obtained. ?                          - Adequate visualization was aided with the use of  ?                          a transparent cap attached to the distal part of  ?                          the endoscope. ?                          After obtaining informed consent, the endoscope was  ?  passed under direct vision. Throughout the  ?                          procedure, the patient's blood pressure, pulse, and  ?                          oxygen saturations were monitored continuously. The  ?                          GIF-[id number] ([id number] scope was introduced through the  ?                          mouth, and advanced to the second part of duodenum.  ?                           The upper GI endoscopy was accomplished without  ?                          difficulty. The patient tolerated the procedure  ?                          well. ?Scope In: 10:39:54 AM ?Scope Out: 10:51:27 AM ?Total Procedure Duration: 0 hours 11 minutes 33 seconds  ?Findings: ?     Two tongues of salmon-colored mucosa were present from 38 to 40 cm.  ?     Squamous islands were present at 38 cm. The maximum longitudinal extent  ?     of these esophageal mucosal changes was 2 cm in length. Biopsies were  ?     taken with a cold forceps for histology. ?     A 2 cm hiatal hernia was present. Area had healed and there was no  ?     presence of ongoing inflammation. ?     The entire examined stomach was normal. Biopsies were taken with a cold  ?     forceps for Helicobacter pylori testing. ?     Localized mildly erythematous mucosa without active bleeding and with no  ?     stigmata of bleeding was found in the duodenal bulb. ?     Results communicated to sister Warren Sullivan ?Impression:               - Salmon-colored mucosa suspicious for  ?                          short-segment Barrett's esophagus. Biopsied. ?                          - 2 cm hiatal hernia. ?                          - Normal stomach. Biopsied. ?                          - Erythematous duodenopathy. ?Moderate Sedation: ?     Per Anesthesia Care ?Recommendation:           - Discharge patient to home (ambulatory). ?                          -  Resume previous diet. ?                          - Await pathology results. ?                          - Continue with pantoprazole 40 mg twice a day. ?Procedure Code(s):        --- Professional --- ?                          [phone], Esophagogastroduodenoscopy, flexible,  ?                          transoral; with biopsy, single or multiple ?Diagnosis Code(s):        --- Professional --- ?                          K22.8, Other specified diseases of esophagus ?                          K44.9, Diaphragmatic  hernia without obstruction or  ?                          gangrene ?                          K31.89, Other diseases of stomach and duodenum ?                          K21.00, Gastro-esophageal reflux disease with  ?                          esophagitis, without bleeding ?CPT copyright 2019 [employer]. All rights reserved. ?The codes documented in this report are preliminary and upon coder review may  ?be revised to meet current compliance requirements. ?Katrinka Blazing, MD ?Katrinka Blazing,  ?10/03/2021 11:01:21 AM ?This report has been signed electronically. ?Number of Addenda: 0 ?

## 2021-10-05 ENCOUNTER — Other Ambulatory Visit (INDEPENDENT_AMBULATORY_CARE_PROVIDER_SITE_OTHER): Payer: Self-pay | Admitting: Gastroenterology

## 2021-10-05 DIAGNOSIS — B9681 Helicobacter pylori [H. pylori] as the cause of diseases classified elsewhere: Secondary | ICD-10-CM

## 2021-10-05 LAB — SURGICAL PATHOLOGY

## 2021-10-05 MED ORDER — LEVOFLOXACIN 500 MG PO TABS: 500.0000 mg | ORAL_TABLET | Freq: Every day | ORAL | 0 refills | Status: AC

## 2021-10-05 MED ORDER — AMOXICILLIN 875 MG PO TABS: 875.0000 mg | ORAL_TABLET | Freq: Three times a day (TID) | ORAL | 0 refills | Status: AC

## 2021-10-06 ENCOUNTER — Encounter (HOSPITAL_COMMUNITY): Payer: Self-pay | Admitting: Gastroenterology

## 2021-10-09 ENCOUNTER — Encounter (INDEPENDENT_AMBULATORY_CARE_PROVIDER_SITE_OTHER): Payer: Self-pay | Admitting: *Deleted

## 2021-11-06 ENCOUNTER — Ambulatory Visit (INDEPENDENT_AMBULATORY_CARE_PROVIDER_SITE_OTHER): Payer: Medicare Other | Admitting: Gastroenterology

## 2021-11-06 ENCOUNTER — Encounter (INDEPENDENT_AMBULATORY_CARE_PROVIDER_SITE_OTHER): Payer: Self-pay | Admitting: Gastroenterology

## 2021-11-06 VITALS — BP 94/60 | HR 52 | Temp 97.6°F | Ht 69.0 in | Wt 205.3 lb

## 2021-11-06 DIAGNOSIS — K2980 Duodenitis without bleeding: Secondary | ICD-10-CM | POA: Diagnosis not present

## 2021-11-06 DIAGNOSIS — K219 Gastro-esophageal reflux disease without esophagitis: Secondary | ICD-10-CM | POA: Diagnosis not present

## 2021-11-06 DIAGNOSIS — B9681 Helicobacter pylori [H. pylori] as the cause of diseases classified elsewhere: Secondary | ICD-10-CM | POA: Diagnosis not present

## 2021-11-06 NOTE — Progress Notes (Signed)
Warren Sullivan, M.D. ?Gastroenterology & Hepatology ?[facility] ?[address] ?[place], [place] [address] ? ?Primary Care Physician: ?Claretta Fraise, MD ?[place] [address] ? ?I will communicate my assessment and recommendations to the referring MD via EMR. ? ?Problems: ?GERD ?Esophagitis ?Hiatal hernia ?H. Pylori gastritis and duodenitis ? ?History of Present Illness: ?Warren Sullivan is a 61 y.o. male with past medical history of CVA, hypertension, hyperlipidemia, dementia, BPH and GERD with esophagitis, who presents for follow up of GERD and esophagitis. ? ?The patient was initially seen in the hospital on 08/18/2021 after presenting an episode of hematemesis.  He did not have any significant drop in his hemoglobin but a CT of the abdomen and pelvis showed presence of prominent thickening of the lower esophagus and enlarged paraesophageal lymph nodes that were concerning for malignancy.  Due to this he underwent an EGD on the same day showed: ?Diffuse mucosal changes characterized by congestion and discoloration with some scant exudates were found in the distal esophagus.  Biopsies were taken with a cold forceps for histology.  A 3 cm hiatal hernia was present. Localized mucosal changes characterized by discoloration and depression were found in the cardia - this ascended to the GE junction.  Imaging was performed using white light and narrow band imaging to visualize the mucosa.  Biopsies were taken with a cold forceps for histology. A few localized small erosions with no stigmata of recent bleeding were found in the gastric antrum.  Biopsies were taken with a cold forceps for Helicobacter pylori testing. Localized severely erythematous mucosa without active bleeding was found in the duodenal bulb.  Biopsies were taken with a cold forceps for histology.  ? ?Fortunately, none of the biopsies came back abnormal for malignancy or dysplasia.  He  was given pantoprazole 40 mg twice daily and was discharged to his nursing home.  He actually underwent a repeat EGD in March 2023 with findings described below. ? ?Notably, he was given bismuth quadruple treatment initially for management of H. pylori gastritis but given persistent infection most recent EGD, he was given a salvage therapy with levofloxacin. ? ?Per nursing staff, he has been eating better than before. He took all of his salvage antibiotic course. No new complaints today.  He has felt fine.  The patient denies having any nausea, vomiting, fever, chills, hematochezia, melena, hematemesis, abdominal distention, abdominal pain, diarrhea, jaundice, pruritus. No more episodes of hematemesis. ? ?Last EGD: 10/03/2021 ?- Salmon-colored mucosa suspicious for short-segment Barrett's esophagus. Biopsied but neg for Barrett's esophagus, consistent with hiatal hernia. ?- 2 cm hiatal hernia. ?- Normal stomach. Biopsied, showed presence of persistent H. pylori ?- Erythematous duodenopathy. ? ?Last Colonoscopy:unknown ? ?Past Medical History: ?Past Medical History:  ?Diagnosis Date  ? CVA (cerebral vascular accident) [facility])   ? Dementia in other diseases classified elsewhere, severe, without behavioral disturbance, psychotic disturbance, mood disturbance, and anxiety ([place])   ? GERD (gastroesophageal reflux disease)   ? Hemiplegia, unspecified affecting right dominant side ([place])   ? Hyperlipidemia   ? Hypertension   ? MDD (major depressive disorder)   ? Stroke [facility])   ? Vitamin D deficiency   ? ? ?Past Surgical History: ?Past Surgical History:  ?Procedure Laterality Date  ? BIOPSY  08/18/2021  ? Procedure: BIOPSY;  Surgeon: Montez Morita, Quillian Quince, MD;  [facility]: [facility];  Service: Gastroenterology;;  Trevor Mace and distal esophagus biopsies   ? BIOPSY  10/03/2021  ? Procedure: BIOPSY;  Surgeon: Montez Morita, Quillian Quince, MD;  [facility];  Service: Gastroenterology;;  ?  ESOPHAGOGASTRODUODENOSCOPY (EGD) WITH PROPOFOL N/A 08/18/2021  ? Procedure: ESOPHAGOGASTRODUODENOSCOPY (EGD) WITH PROPOFOL;  Surgeon: Harvel Quale, MD;  [facility];  Service: Gastroenterology;  Laterality: N/A;  ? ESOPHAGOGASTRODUODENOSCOPY (EGD) WITH PROPOFOL N/A 10/03/2021  ? Procedure: ESOPHAGOGASTRODUODENOSCOPY (EGD) WITH PROPOFOL;  Surgeon: Harvel Quale, MD;  Location: [facility];  Service: Gastroenterology;  Laterality: N/A;  [id number] ASA 1 In [facility]  ? HERNIA REPAIR    ? ? ?Family History: ?Family History  ?Problem Relation Age of Onset  ? Hypertension Mother   ? Stroke Mother   ? Hypertension Father   ? Hypertension Sister   ? ? ?Social History: ?Social History  ? ?Tobacco Use  ?Smoking Status Every Day  ? Packs/day: 0.25  ? Types: Cigarettes  ?Smokeless Tobacco Not on file  ? ?Social History  ? ?Substance and Sexual Activity  ?Alcohol Use No  ? ?Social History  ? ?Substance and Sexual Activity  ?Drug Use No  ? ? ?Allergies: ?No Known Allergies ? ?Medications: ?Current Outpatient Medications  ?Medication Sig Dispense Refill  ? acetaminophen (TYLENOL) 650 MG CR tablet Take 650 mg by mouth every 8 (eight) hours as needed for pain.    ? aspirin EC 81 MG tablet Take 1 tablet (81 mg total) by mouth daily with breakfast. Swallow whole. 30 tablet 11  ? atorvastatin (LIPITOR) 20 MG tablet Take 1 tablet (20 mg total) by mouth daily. 30 tablet 3  ? bismuth subsalicylate (PEPTO BISMOL) 262 MG chewable tablet Chew 524 mg by mouth as needed. 2 po Q six hours prn diarrhea.    ? Calcium Carbonate (CALCIUM 500 PO) Take 1 tablet by mouth daily.    ? Cholecalciferol 25 MCG (1000 UT) capsule Take 5,000 Units by mouth daily.    ? cloNIDine (CATAPRES) 0.1 MG tablet Take 2 tablets (0.2 mg total) by mouth 2 (two) times daily. 60 tablet 3  ? gabapentin (NEURONTIN) 300 MG capsule Take 1 capsule (300 mg total) by mouth 3 (three) times daily. Start with one at bedtime for 1 week.  Then two at bedtime for one week, then go to full dose. 90 capsule 5  ? losartan (COZAAR) 50 MG tablet Take 1 tablet (50 mg total) by mouth daily. 30 tablet 3  ? memantine (NAMENDA) 10 MG tablet Take 10 mg by mouth 2 (two) times daily.    ? Multiple Vitamin (MULTIVITAMIN) tablet Take 1 tablet by mouth daily.    ? pantoprazole (PROTONIX) 40 MG tablet Take 1 tablet (40 mg total) by mouth 2 (two) times daily. 60 tablet 3  ? tamsulosin (FLOMAX) 0.4 MG CAPS capsule Take 0.4 mg by mouth daily.    ? VASCEPA 1 g capsule Take 2 g by mouth 2 (two) times daily.    ? venlafaxine (EFFEXOR) 37.5 MG tablet Take 37.5 mg by mouth daily at 6 (six) AM.    ? vitamin B-12 (CYANOCOBALAMIN) 500 MCG tablet Take 500 mcg by mouth daily.    ? ?No current facility-administered medications for this visit.  ? ? ?Review of Systems: ?GENERAL: negative for malaise, night sweats ?HEENT: No changes in hearing or vision, no nose bleeds or other nasal problems. ?NECK: Negative for lumps, goiter, pain and significant neck swelling ?RESPIRATORY: Negative for cough, wheezing ?CARDIOVASCULAR: Negative for chest pain, leg swelling, palpitations, orthopnea ?GI: SEE HPI ?MUSCULOSKELETAL: Negative for joint pain or swelling, back pain, and  muscle pain. ?SKIN: Negative for lesions, rash ?PSYCH: Negative for sleep disturbance, mood disorder and recent psychosocial stressors. ?HEMATOLOGY Negative for prolonged bleeding, bruising easily, and swollen nodes. ?ENDOCRINE: Negative for cold or heat intolerance, polyuria, polydipsia and goiter. ?NEURO: negative for tremor, gait imbalance, syncope and seizures. ?The remainder of the review of systems is noncontributory. ? ? ?Physical Exam: ?BP 94/60 (BP Location: Left Arm, Patient Position: Sitting, Cuff Size: Large)   Pulse (!) 52   Temp 97.6 ?F (36.4 ?C) (Oral)   Ht 5\' 9"  (1.753 m)   Wt 205 lb 4.8 oz (93.1 kg)   BMI 30.32 kg/m?  ?GENERAL: The patient is awake, answers simple questions in no acute distress. Sitting  in wheelchair ?HEENT: Head is normocephalic and atraumatic. EOMI are intact. Mouth is well hydrated and without lesions. ?NECK: Supple. No masses ?LUNGS: Clear to auscultation. No presence of rhonchi/wheezing/rales. Adequate chest e

## 2021-11-06 NOTE — Patient Instructions (Signed)
Continue pantoprazole 40 mg twice a day ?Stop bismuth ?

## 2022-01-29 ENCOUNTER — Observation Stay (HOSPITAL_COMMUNITY)
Admission: EM | Admit: 2022-01-29 | Discharge: 2022-01-30 | Disposition: A | Discharge status: Skilled Nursing Facility | Payer: Medicare Other | Attending: Emergency Medicine | Admitting: Emergency Medicine

## 2022-01-29 ENCOUNTER — Observation Stay (HOSPITAL_COMMUNITY): Payer: Medicare Other

## 2022-01-29 ENCOUNTER — Other Ambulatory Visit: Payer: Self-pay

## 2022-01-29 ENCOUNTER — Encounter (HOSPITAL_COMMUNITY): Payer: Self-pay

## 2022-01-29 DIAGNOSIS — Z79899 Other long term (current) drug therapy: Secondary | ICD-10-CM | POA: Insufficient documentation

## 2022-01-29 DIAGNOSIS — N400 Enlarged prostate without lower urinary tract symptoms: Secondary | ICD-10-CM | POA: Diagnosis not present

## 2022-01-29 DIAGNOSIS — F17210 Nicotine dependence, cigarettes, uncomplicated: Secondary | ICD-10-CM | POA: Diagnosis not present

## 2022-01-29 DIAGNOSIS — K219 Gastro-esophageal reflux disease without esophagitis: Secondary | ICD-10-CM | POA: Diagnosis not present

## 2022-01-29 DIAGNOSIS — R1115 Cyclical vomiting syndrome unrelated to migraine: Secondary | ICD-10-CM

## 2022-01-29 DIAGNOSIS — F0390 Unspecified dementia without behavioral disturbance: Secondary | ICD-10-CM | POA: Diagnosis present

## 2022-01-29 DIAGNOSIS — K2289 Other specified disease of esophagus: Secondary | ICD-10-CM | POA: Insufficient documentation

## 2022-01-29 DIAGNOSIS — F01518 Vascular dementia, unspecified severity, with other behavioral disturbance: Secondary | ICD-10-CM | POA: Diagnosis not present

## 2022-01-29 DIAGNOSIS — K449 Diaphragmatic hernia without obstruction or gangrene: Secondary | ICD-10-CM | POA: Diagnosis not present

## 2022-01-29 DIAGNOSIS — Z86718 Personal history of other venous thrombosis and embolism: Secondary | ICD-10-CM | POA: Insufficient documentation

## 2022-01-29 DIAGNOSIS — I10 Essential (primary) hypertension: Secondary | ICD-10-CM | POA: Insufficient documentation

## 2022-01-29 DIAGNOSIS — Z7982 Long term (current) use of aspirin: Secondary | ICD-10-CM | POA: Insufficient documentation

## 2022-01-29 DIAGNOSIS — K920 Hematemesis: Secondary | ICD-10-CM | POA: Diagnosis not present

## 2022-01-29 DIAGNOSIS — R1111 Vomiting without nausea: Secondary | ICD-10-CM | POA: Diagnosis present

## 2022-01-29 DIAGNOSIS — I2510 Atherosclerotic heart disease of native coronary artery without angina pectoris: Secondary | ICD-10-CM | POA: Diagnosis not present

## 2022-01-29 LAB — COMPREHENSIVE METABOLIC PANEL
ALT: 23 U/L (ref 0–44)
AST: 19 U/L (ref 15–41)
Albumin: 4.1 g/dL (ref 3.5–5.0)
Alkaline Phosphatase: 80 U/L (ref 38–126)
Anion gap: 8 (ref 5–15)
BUN: 21 mg/dL (ref 8–23)
CO2: 31 mmol/L (ref 22–32)
Calcium: 9.2 mg/dL (ref 8.9–10.3)
Chloride: 102 mmol/L (ref 98–111)
Creatinine, Ser: 1.02 mg/dL (ref 0.61–1.24)
GFR, Estimated: >60 mL/min (ref >60)
Glucose, Bld: 108 mg/dL — ABNORMAL HIGH (ref 70–99)
Potassium: 4 mmol/L (ref 3.5–5.1)
Sodium: 141 mmol/L (ref 135–145)
Total Bilirubin: 0.7 mg/dL (ref 0.3–1.2)
Total Protein: 8.3 g/dL — ABNORMAL HIGH (ref 6.5–8.1)

## 2022-01-29 LAB — CBC
HCT: 44.2 % (ref 39.0–52.0)
Hemoglobin: 14.6 g/dL (ref 13.0–17.0)
MCH: 29.6 pg (ref 26.0–34.0)
MCHC: 33 g/dL (ref 30.0–36.0)
MCV: 89.7 fL (ref 80.0–100.0)
Platelets: 163 10*3/uL (ref 150–400)
RBC: 4.93 MIL/uL (ref 4.22–5.81)
RDW: 13.3 % (ref 11.5–15.5)
WBC: 7.6 10*3/uL (ref 4.0–10.5)
nRBC: 0 % (ref 0.0–0.2)

## 2022-01-29 LAB — TYPE AND SCREEN
ABO/RH(D): O POS
Antibody Screen: NEGATIVE

## 2022-01-29 MED ORDER — VITAMIN B-12 1000 MCG PO TABS
500.0000 ug | ORAL_TABLET | Freq: Every day | ORAL | Status: DC
  Administered 2022-01-29: 500 ug via ORAL
  Filled 2022-01-29 (×3): qty 1

## 2022-01-29 MED ORDER — SODIUM CHLORIDE 0.9% FLUSH
3.0000 mL | Freq: Two times a day (BID) | INTRAVENOUS | Status: DC
  Administered 2022-01-29: 3 mL via INTRAVENOUS

## 2022-01-29 MED ORDER — ONDANSETRON 4 MG PO TBDP
4.0000 mg | ORAL_TABLET | Freq: Once | ORAL | Status: AC
  Administered 2022-01-29: 4 mg via ORAL
  Filled 2022-01-29: qty 1

## 2022-01-29 MED ORDER — TAMSULOSIN HCL 0.4 MG PO CAPS
0.4000 mg | ORAL_CAPSULE | Freq: Every day | ORAL | Status: DC
  Administered 2022-01-29: 0.4 mg via ORAL
  Filled 2022-01-29: qty 1

## 2022-01-29 MED ORDER — SODIUM CHLORIDE 0.9 % IV BOLUS
1000.0000 mL | Freq: Once | INTRAVENOUS | Status: AC
  Administered 2022-01-29: 1000 mL via INTRAVENOUS

## 2022-01-29 MED ORDER — ALBUTEROL SULFATE (2.5 MG/3ML) 0.083% IN NEBU: 2.5000 mg | INHALATION_SOLUTION | RESPIRATORY_TRACT | Status: DC | PRN

## 2022-01-29 MED ORDER — ADULT MULTIVITAMIN W/MINERALS CH
1.0000 | ORAL_TABLET | Freq: Every day | ORAL | Status: DC
  Administered 2022-01-29: 1 via ORAL
  Filled 2022-01-29 (×5): qty 1

## 2022-01-29 MED ORDER — MEMANTINE HCL 10 MG PO TABS
10.0000 mg | ORAL_TABLET | Freq: Two times a day (BID) | ORAL | Status: DC
  Administered 2022-01-29: 10 mg via ORAL
  Filled 2022-01-29 (×2): qty 1

## 2022-01-29 MED ORDER — ACETAMINOPHEN 325 MG PO TABS: 650.0000 mg | ORAL_TABLET | Freq: Four times a day (QID) | ORAL | Status: DC | PRN

## 2022-01-29 MED ORDER — ACETAMINOPHEN 650 MG RE SUPP: 650.0000 mg | Freq: Four times a day (QID) | RECTAL | Status: DC | PRN

## 2022-01-29 MED ORDER — VENLAFAXINE HCL 75 MG PO TABS: 37.5000 mg | ORAL_TABLET | Freq: Every day | ORAL | Status: DC

## 2022-01-29 MED ORDER — SODIUM CHLORIDE 0.9% FLUSH: 3.0000 mL | INTRAVENOUS | Status: DC | PRN

## 2022-01-29 MED ORDER — GABAPENTIN 300 MG PO CAPS
300.0000 mg | ORAL_CAPSULE | Freq: Three times a day (TID) | ORAL | Status: DC
  Administered 2022-01-29 (×2): 300 mg via ORAL
  Filled 2022-01-29 (×3): qty 1

## 2022-01-29 MED ORDER — POLYETHYLENE GLYCOL 3350 17 G PO PACK
17.0000 g | PACK | Freq: Every day | ORAL | Status: DC | PRN
Start: 2022-01-29 — End: 2022-01-30

## 2022-01-29 MED ORDER — ASPIRIN 81 MG PO TBEC
81.0000 mg | DELAYED_RELEASE_TABLET | Freq: Every day | ORAL | Status: DC
  Filled 2022-01-29: qty 1

## 2022-01-29 MED ORDER — ATORVASTATIN CALCIUM 20 MG PO TABS
20.0000 mg | ORAL_TABLET | Freq: Every day | ORAL | Status: DC
  Administered 2022-01-29: 20 mg via ORAL
  Filled 2022-01-29: qty 1

## 2022-01-29 MED ORDER — SENNOSIDES-DOCUSATE SODIUM 8.6-50 MG PO TABS
2.0000 | ORAL_TABLET | Freq: Every day | ORAL | Status: DC
  Administered 2022-01-29: 2 via ORAL
  Filled 2022-01-29: qty 2

## 2022-01-29 MED ORDER — ONDANSETRON HCL 4 MG/2ML IJ SOLN: 4.0000 mg | Freq: Four times a day (QID) | INTRAMUSCULAR | Status: DC | PRN

## 2022-01-29 MED ORDER — BISACODYL 10 MG RE SUPP
10.0000 mg | Freq: Once | RECTAL | Status: AC
  Administered 2022-01-29: 10 mg via RECTAL
  Filled 2022-01-29: qty 1

## 2022-01-29 MED ORDER — SODIUM CHLORIDE 0.9 % IV SOLN
3.0000 g | Freq: Four times a day (QID) | INTRAVENOUS | Status: DC
  Administered 2022-01-29 – 2022-01-30 (×2): 3 g via INTRAVENOUS
  Filled 2022-01-29 (×2): qty 8

## 2022-01-29 MED ORDER — METOCLOPRAMIDE HCL 5 MG/ML IJ SOLN
10.0000 mg | Freq: Once | INTRAMUSCULAR | Status: AC
  Administered 2022-01-29: 10 mg via INTRAVENOUS
  Filled 2022-01-29: qty 2

## 2022-01-29 MED ORDER — LABETALOL HCL 5 MG/ML IV SOLN: 10.0000 mg | INTRAVENOUS | Status: DC | PRN

## 2022-01-29 MED ORDER — SODIUM CHLORIDE 0.9 % IV SOLN: INTRAVENOUS | Status: DC | PRN

## 2022-01-29 MED ORDER — ONDANSETRON HCL 4 MG PO TABS: 4.0000 mg | ORAL_TABLET | Freq: Four times a day (QID) | ORAL | Status: DC | PRN

## 2022-01-29 MED ORDER — CLONIDINE HCL 0.2 MG PO TABS
0.2000 mg | ORAL_TABLET | Freq: Two times a day (BID) | ORAL | Status: DC
  Administered 2022-01-29: 0.2 mg via ORAL
  Filled 2022-01-29 (×2): qty 1

## 2022-01-29 MED ORDER — BISACODYL 10 MG RE SUPP: 10.0000 mg | Freq: Every day | RECTAL | Status: DC | PRN

## 2022-01-29 MED ORDER — POLYETHYLENE GLYCOL 3350 17 G PO PACK
17.0000 g | PACK | Freq: Two times a day (BID) | ORAL | Status: DC
  Administered 2022-01-29: 17 g via ORAL
  Filled 2022-01-29 (×2): qty 1

## 2022-01-29 MED ORDER — PANTOPRAZOLE 80MG IVPB - SIMPLE MED
80.0000 mg | Freq: Once | INTRAVENOUS | Status: AC
  Administered 2022-01-29: 80 mg via INTRAVENOUS
  Filled 2022-01-29: qty 100

## 2022-01-29 MED ORDER — PANTOPRAZOLE INFUSION (NEW) - SIMPLE MED
8.0000 mg/h | INTRAVENOUS | Status: DC
  Administered 2022-01-29 – 2022-01-30 (×2): 8 mg/h via INTRAVENOUS
  Filled 2022-01-29 (×5): qty 100

## 2022-01-29 MED ORDER — ICOSAPENT ETHYL 1 G PO CAPS
2.0000 g | ORAL_CAPSULE | Freq: Two times a day (BID) | ORAL | Status: DC
  Administered 2022-01-29: 2 g via ORAL
  Filled 2022-01-29 (×4): qty 2

## 2022-01-29 MED ORDER — PANTOPRAZOLE SODIUM 40 MG IV SOLR: 40.0000 mg | Freq: Two times a day (BID) | INTRAVENOUS | Status: DC

## 2022-01-29 MED ORDER — VENLAFAXINE HCL ER 37.5 MG PO CP24
37.5000 mg | ORAL_CAPSULE | Freq: Every day | ORAL | Status: DC
Start: 2022-01-30 — End: 2022-01-30
  Filled 2022-01-29 (×2): qty 1

## 2022-01-29 MED ORDER — DM-GUAIFENESIN ER 30-600 MG PO TB12
1.0000 | ORAL_TABLET | Freq: Two times a day (BID) | ORAL | Status: DC
  Filled 2022-01-29 (×2): qty 1

## 2022-01-29 NOTE — ED Notes (Signed)
Orthostatics not performed and provider made aware that the patient projectile vomited across the room.  Emesis was dark in color.  Cleaned the patient up and changed linens.

## 2022-01-29 NOTE — ED Provider Notes (Signed)
[facility] EMERGENCY DEPARTMENT Provider Note   CSN: [id number] Arrival date & time: 01/29/22  1300     History  Chief Complaint  Patient presents with   Emesis    Warren Sullivan is a 61 y.o. male who presents the emergency department for vomiting.  Patient lives at nursing facility, and was brought in after vomiting what appeared to be coffee grounds.  Patient has history of dementia and is a poor historian.  Family member who is with him states that he has had history of prior, and had to stay in the hospital.  Upon being roomed in the ER patient projectile vomited across the room, emesis was dark in color.  Lives at [facility].  Level 5 caveat due to dementia   Emesis      Home Medications Prior to Admission medications   Medication Sig Start Date End Date Taking? Authorizing Provider  acetaminophen (TYLENOL) 650 MG CR tablet Take 650 mg by mouth every 8 (eight) hours as needed for pain.   Yes [provider]  aspirin EC 81 MG tablet Take 1 tablet (81 mg total) by mouth daily with breakfast. Swallow whole. 08/20/21  Yes Emokpae, Courage, MD  atorvastatin (LIPITOR) 20 MG tablet Take 1 tablet (20 mg total) by mouth daily. 08/20/21  Yes Emokpae, Courage, MD  Calcium Carbonate (CALCIUM 500 PO) Take 1 tablet by mouth daily.   Yes [provider]  Cholecalciferol 25 MCG (1000 UT) capsule Take 5,000 Units by mouth daily.   Yes [provider]  cloNIDine (CATAPRES) 0.1 MG tablet Take 2 tablets (0.2 mg total) by mouth 2 (two) times daily. 08/20/21  Yes Emokpae, Courage, MD  gabapentin (NEURONTIN) 300 MG capsule Take 1 capsule (300 mg total) by mouth 3 (three) times daily. Start with one at bedtime for 1 week. Then two at bedtime for one week, then go to full dose. 10/19/15  Yes Stacks, Broadus John, MD  losartan (COZAAR) 50 MG tablet Take 1 tablet (50 mg total) by mouth daily. 08/31/21  Yes Emokpae, Courage, MD  memantine (NAMENDA) 10 MG tablet Take 10 mg by mouth 2  (two) times daily. 07/24/21  Yes [provider]  Multiple Vitamin (MULTIVITAMIN) tablet Take 1 tablet by mouth daily.   Yes [provider]  pantoprazole (PROTONIX) 40 MG tablet Take 1 tablet (40 mg total) by mouth 2 (two) times daily. 08/20/21 08/20/22 Yes Emokpae, Courage, MD  tamsulosin (FLOMAX) 0.4 MG CAPS capsule Take 0.4 mg by mouth daily. 07/11/21  Yes [provider]  VASCEPA 1 g capsule Take 2 g by mouth 2 (two) times daily. 08/08/21  Yes [provider]  venlafaxine (EFFEXOR) 37.5 MG tablet Take 37.5 mg by mouth daily at 6 (six) AM.   Yes [provider]  vitamin B-12 (CYANOCOBALAMIN) 500 MCG tablet Take 500 mcg by mouth daily.   Yes [provider]      Allergies    Patient has no known allergies.    Review of Systems   Review of Systems  Unable to perform ROS: Dementia  Gastrointestinal:  Positive for vomiting.    Physical Exam Updated Vital Signs BP (!) 162/77   Pulse 62   Temp 98 F (36.7 C) (Oral)   Resp 18   Ht 5\' 11"  (1.803 m)   Wt 96.6 kg   SpO2 94%   BMI 29.71 kg/m  Physical Exam Vitals and nursing note reviewed.  Constitutional:      Appearance: Normal appearance.  HENT:     Head: Normocephalic and atraumatic.     Comments: Dried dark blood/emesis in the patient's beard Eyes:     Conjunctiva/sclera: Conjunctivae normal.  Cardiovascular:     Rate and Rhythm: Normal rate and regular rhythm.  Pulmonary:     Effort: Pulmonary effort is normal. No respiratory distress.     Breath sounds: Normal breath sounds.  Abdominal:     General: There is no distension.     Palpations: Abdomen is soft.     Tenderness: There is no abdominal tenderness.  Skin:    General: Skin is warm and dry.  Neurological:     General: No focal deficit present.     Mental Status: He is alert.     ED Results / Procedures / Treatments   Labs (all labs ordered are listed, but only abnormal results are displayed) Labs Reviewed   COMPREHENSIVE METABOLIC PANEL - Abnormal; Notable for the following components:      Result Value   Glucose, Bld 108 (*)    Total Protein 8.3 (*)    All other components within normal limits  POC OCCULT BLOOD, ED - Abnormal  CBC  TYPE AND SCREEN    EKG None  Radiology DG ABD ACUTE 2+V W 1V CHEST  Result Date: 01/29/2022 CLINICAL DATA:  Vomiting EXAM: DG ABDOMEN ACUTE WITH 1 VIEW CHEST COMPARISON:  08/31/2021 FINDINGS: Single-view chest demonstrates low lung volumes. Vague opacity in the right mid lung. Stable cardiomediastinal silhouette. Low lung volumes. Supine and upright views of the abdomen demonstrate no free air beneath the diaphragm. Mild diffuse increased bowel gas without obstructive pattern, gas in the rectum. Moderate stool in the colon. No radiopaque calculi. Vascular calcification IMPRESSION: 1. Possible vague right midlung opacity could be a focus of infection, suggest two-view follow-up chest radiograph 2. Mild diffuse increased bowel gas without obstructive pattern Electronically Signed   By: Jasmine Pang M.D.   On: 01/29/2022 17:26    Procedures Procedures    Medications Ordered in ED Medications  pantoprozole (PROTONIX) 80 mg /NS 100 mL infusion (8 mg/hr Intravenous New Bag/Given 01/29/22 1654)  pantoprazole (PROTONIX) injection 40 mg (has no administration in time range)  bisacodyl (DULCOLAX) suppository 10 mg (has no administration in time range)  polyethylene glycol (MIRALAX / GLYCOLAX) packet 17 g (has no administration in time range)  senna-docusate (Senokot-S) tablet 2 tablet (has no administration in time range)  ondansetron (ZOFRAN-ODT) disintegrating tablet 4 mg (4 mg Oral Given 01/29/22 1454)  sodium chloride 0.9 % bolus 1,000 mL (1,000 mLs Intravenous New Bag/Given 01/29/22 1557)  pantoprazole (PROTONIX) 80 mg /NS 100 mL IVPB (0 mg Intravenous Stopped 01/29/22 1619)  metoCLOPramide (REGLAN) injection 10 mg (10 mg Intravenous Given 01/29/22 1648)    ED  Course/ Medical Decision Making/ A&P                           Medical Decision Making Amount and/or Complexity of Data Reviewed Labs: ordered.  Risk Prescription drug management. Decision regarding hospitalization.  This patient is a 61 y.o. male  who presents to the ED for concern of vomiting blood.   Differential diagnoses prior to evaluation: The emergent differential diagnosis includes, but is not limited to,  Ischemic bowel, Sepsis, Drug-related (toxicity, THC hyperemesis, ETOH, withdrawal), Appendicitis, Bowel obstruction, Electrolyte abnormalities, Pancreatitis, Biliary colic, Gastroenteritis, Gastroparesis, Hepatitis, Migraine, GERD/PUD. This is not an exhaustive differential.   Past Medical History / Co-morbidities: Hypertension, stroke,  vascular dementia, hyperlipidemia, GERD, history of significant alcohol use  Additional history: Chart reviewed. Pertinent results include: Patient admitted to the hospital on 2/20 for sepsis due to pneumonia with acute renal failure and H. pylori gastritis.  Patient had follow-up EGD with [facility] on 3/28 that showed findings of peptic duodenitis.  Physical Exam: Physical exam performed. The pertinent findings include: Hypertensive to 162/77, afebrile.  Not tachycardic.  Abdomen soft, nontender.  Prior to my evaluation patient projectile vomited across the room, I observed the emesis and it was dark brown with some red streaking.  Lab Tests/Imaging studies: I personally interpreted labs/imaging and the pertinent results include: No ptosis, normal hemoglobin.  Normal kidney and liver function.  Emesis was heme positive.    Medications: I ordered medication including Zofran, Reglan, Protonix, and IV fluids.  I have reviewed the patients home medicines and have made adjustments as needed.  Consultations obtained: I consulted gastroenterologist Dr. Levon Hedger who recommended patient be admitted to medicine, be NPO, have protonix  infusion, and have endoscopy in the morning.  I consulted hospitalist Dr Mariea Clonts who will admit patient  Disposition: After consideration of the diagnostic results and the patients response to treatment, I feel that patient is requiring admission for upper GI bleed requiring endoscopy.  Final Clinical Impression(s) / ED Diagnoses Final diagnoses:  None    Rx / DC Orders ED Discharge Orders     None      Portions of this report may have been transcribed using voice recognition software. Every effort was made to ensure accuracy; however, inadvertent computerized transcription errors may be present.    Jeanella Flattery 01/29/22 1748    Warren Files, MD 01/30/22 1056

## 2022-01-29 NOTE — H&P (Addendum)
Patient Demographics:    Warren Sullivan, is a 61 y.o. male  MRN: [id number]   DOB - 1960/08/13  Admit Date - 01/29/2022  Outpatient Primary MD for the patient is Sherol Dade, DO   Assessment & Plan:    Assessment and Plan: 1)Intractable emesis with coffee Ground Emesis--Gastroccult positive, but Hgb 14.6 -Abdominal x-rays without obstructive pattern -had EGD on 08/18/2021 and repeat EGD on 10/03/2021,  biopsies consistent with H. pylori infection patient was treated, finding of hiatal hernia and erythematous duodenopathy -Previous abdominal imaging/CT scan suggested possible gastrointestinal malignancy however EGDs x2 as above with biopsies negative for malignancy -As needed antiemetics -GI consult quested for possible EGD in a.m. -IV Protonix as ordered   2)Vascular Dementia with behavioral disturbance--- stable, -Namenda and Effexor as ordered   3)H/o Strokes--- aspirin and Lipitor for secondary stroke prophylaxis   4)HTN-stable, continue clonidine 0.25 mg twice daily  5) social/ethics--plan of care and CODE STATUS discussed with patient's Sister Derrill Kay at bedside -Remains full code  6) possible aspiration pneumonia--Unasyn as ordered -Bronchodilators and mucolytics as ordered  7)BPH--continue Flomax  Disposition/Need for in-Hospital Stay- patient unable to be discharged at this time due to --intractable emesis requiring IV antiemetics and GI evaluation, presumed aspiration pneumonia requiring IV Unasyn*  Dispo: The patient is from: [facility]              Anticipated d/c is to: SNF              Anticipated d/c date is: 2 days              Patient currently is not medically stable to d/c. Barriers: Not Clinically Stable-    With History of - Reviewed by me  Past Medical History:   Diagnosis Date   CVA (cerebral vascular accident) (HCC)    Dementia in other diseases classified elsewhere, severe, without behavioral disturbance, psychotic disturbance, mood disturbance, and anxiety (HCC)    GERD (gastroesophageal reflux disease)    Hemiplegia, unspecified affecting right dominant side (HCC)    Hyperlipidemia    Hypertension    MDD (major depressive disorder)    Stroke (HCC)    Vitamin D deficiency       Past Surgical History:  Procedure Laterality Date   BIOPSY  08/18/2021   Procedure: BIOPSY;  Surgeon: Dolores Frame, MD;  [facility]: [facility];  Service: Gastroenterology;;  Bonnielee Haff and distal esophagus biopsies    BIOPSY  10/03/2021   Procedure: BIOPSY;  Surgeon: Dolores Frame, MD;  Location: [facility];  Service: Gastroenterology;;   ESOPHAGOGASTRODUODENOSCOPY (EGD) WITH PROPOFOL N/A 08/18/2021   Procedure: ESOPHAGOGASTRODUODENOSCOPY (EGD) WITH PROPOFOL;  Surgeon: Dolores Frame, MD;  Location: [facility];  Service: Gastroenterology;  Laterality: N/A;   ESOPHAGOGASTRODUODENOSCOPY (EGD) WITH PROPOFOL N/A 10/03/2021   Procedure: ESOPHAGOGASTRODUODENOSCOPY (EGD) WITH PROPOFOL;  Surgeon: Dolores Frame, MD;  Location: [facility];  Service: Gastroenterology;  Laterality: N/A;  1015 ASA 1 In [facility]   HERNIA REPAIR      Chief Complaint  Patient presents with   Emesis      HPI:    Warren Sullivan  is a 61 y.o. male who is a reformed alcoholic with past medical history significant of prior stroke with some degree of vascular dementia,, hypertension, hyperlipidemia,  BPH who presents to the emergency department from [facility] with recurrent episodes of emesis and concerns for possible coffee-ground--- in the ED patient had another episode of projectile vomiting, Gastroccult was positive -Patient has had episode of emesis on and off, had EGD on 08/18/2021 and repeat EGD on 10/03/2021,  biopsies  consistent with H. pylori infection patient was treated, finding of hiatal hernia and erythematous duodenopathy -Previous abdominal imaging/CT scan suggested possible gastrointestinal malignancy however EGDs x2 as above with biopsies negative for malignancy -Patient is a poor historian due to underlying vascular dementia, -Additional history obtained from patient's sister Warren Sullivan at bedside -X-rays of the chest and abdomen with possible mid right lung findings otherwise no obstructive pattern on abdominal x-rays -CBC WNL, Hgb 14.6 -Creatinine 1.0, LFTs are not elevated -Discussed with GI service recommend n.p.o. overnight for possible EGD in a.m.    Review of systems:    In addition to the HPI above,   A full Review of  Systems was done, all other systems reviewed are negative except as noted above in HPI , .    Social History:  Reviewed by me    Social History   Tobacco Use   Smoking status: Every Day    Packs/day: 0.25    Types: Cigarettes   Smokeless tobacco: Not on file  Substance Use Topics   Alcohol use: No    Family History :  Reviewed by me    Family History  Problem Relation Age of Onset   Hypertension Mother    Stroke Mother    Hypertension Father    Hypertension Sister      Home Medications:   Prior to Admission medications   Medication Sig Start Date End Date Taking? Authorizing Provider  acetaminophen (TYLENOL) 650 MG CR tablet Take 650 mg by mouth every 8 (eight) hours as needed for pain.   Yes [provider]  aspirin EC 81 MG tablet Take 1 tablet (81 mg total) by mouth daily with breakfast. Swallow whole. 08/20/21  Yes Jonah Gingras, MD  atorvastatin (LIPITOR) 20 MG tablet Take 1 tablet (20 mg total) by mouth daily. 08/20/21  Yes Alexa Blish, MD  Calcium Carbonate (CALCIUM 500 PO) Take 1 tablet by mouth daily.   Yes [provider]  Cholecalciferol 25 MCG (1000 UT) capsule Take 5,000 Units by mouth daily.   Yes [provider]  cloNIDine (CATAPRES) 0.1 MG tablet Take 2 tablets (0.2 mg total) by mouth 2 (two) times daily. 08/20/21  Yes Chares Slaymaker, MD  gabapentin (NEURONTIN) 300 MG capsule Take 1 capsule (300 mg total) by mouth 3 (three) times daily. Start with one at bedtime for 1 week. Then two at bedtime for one week, then go to full dose. 10/19/15  Yes Stacks, Broadus John, MD  losartan (COZAAR) 50 MG tablet Take 1 tablet (50 mg total) by mouth daily. 08/31/21  Yes Meggan Dhaliwal, MD  memantine (NAMENDA) 10 MG tablet Take 10 mg by mouth 2 (two) times daily. 07/24/21  Yes [provider]  Multiple Vitamin (MULTIVITAMIN) tablet Take 1 tablet by mouth daily.   Yes [provider]  pantoprazole (PROTONIX) 40 MG tablet Take 1 tablet (40 mg total) by mouth 2 (two) times daily. 08/20/21 08/20/22 Yes Azaria Stegman, MD  tamsulosin (FLOMAX) 0.4 MG CAPS capsule Take 0.4 mg by mouth daily. 07/11/21  Yes [provider]  VASCEPA 1 g capsule Take 2 g by mouth 2 (two) times daily. 08/08/21  Yes [provider]  venlafaxine (EFFEXOR) 37.5 MG tablet Take 37.5 mg by mouth daily at 6 (six) AM.   Yes [provider]  vitamin B-12 (CYANOCOBALAMIN) 500 MCG tablet Take 500 mcg by mouth daily.   Yes [provider]     Allergies:    No Known Allergies   Physical Exam:   Vitals  Blood pressure (!) 170/78, pulse (!) 56, temperature 98.6 F (37 C), temperature source Oral, resp. rate 19, height 5\' 11"  (1.803 m), weight 96.6 kg, SpO2 95 %.  Physical Examination: General appearance - alert,  in no distress  Mental status - alert, oriented to person, place, and time, --some baseline memory and cognitive deficits Eyes - sclera anicteric Neck - supple, no JVD elevation , Chest - clear  to auscultation bilaterally, symmetrical air movement,  Heart - S1 and S2 normal, regular  Abdomen - soft, nontender, nondistended, +BS Neurological - screening mental status exam normal,  neck supple without rigidity, cranial nerves II through XII intact, DTR's normal and symmetric Extremities - no pedal edema noted, intact peripheral pulses  Skin - warm, dry     Data Review:    CBC Recent Labs  Lab 01/29/22 1512  WBC 7.6  HGB 14.6  HCT 44.2  PLT 163  MCV 89.7  MCH 29.6  MCHC 33.0  RDW 13.3   ------------------------------------------------------------------------------------------------------------------  Chemistries  Recent Labs  Lab 01/29/22 1512  NA 141  K 4.0  CL 102  CO2 31  GLUCOSE 108*  BUN 21  CREATININE 1.02  CALCIUM 9.2  AST 19  ALT 23  ALKPHOS 80  BILITOT 0.7   ------------------------------------------------------------------------------------------------------------------ estimated creatinine clearance is 89 mL/min (by C-G formula based on SCr of 1.02 mg/dL). ------------------------------------------------------------------------------------------------------------------ No results for input(s): "TSH", "T4TOTAL", "T3FREE", "THYROIDAB" in the last 72 hours.  Invalid input(s): "FREET3"   Coagulation profile No results for input(s): "INR", "PROTIME" in the last 168 hours. ------------------------------------------------------------------------------------------------------------------- No results for input(s): "DDIMER" in the last 72 hours. -------------------------------------------------------------------------------------------------------------------  Cardiac Enzymes No results for input(s): "CKMB", "TROPONINI", "MYOGLOBIN" in the last 168 hours.  Invalid input(s): "CK" ------------------------------------------------------------------------------------------------------------------ No results found for: "BNP"   ---------------------------------------------------------------------------------------------------------------  Urinalysis    Component Value Date/Time   COLORURINE YELLOW 08/30/2021 0320   APPEARANCEUR CLEAR  08/30/2021 0320   LABSPEC 1.016 08/30/2021 0320   PHURINE 5.0 08/30/2021 0320   GLUCOSEU NEGATIVE 08/30/2021 0320   HGBUR SMALL (A) 08/30/2021 0320   BILIRUBINUR NEGATIVE 08/30/2021 0320   KETONESUR NEGATIVE 08/30/2021 0320   PROTEINUR NEGATIVE 08/30/2021 0320   UROBILINOGEN 0.2 02/19/2013 1857   NITRITE NEGATIVE 08/30/2021 0320   LEUKOCYTESUR NEGATIVE 08/30/2021 0320    ----------------------------------------------------------------------------------------------------------------   Imaging Results:    DG ABD ACUTE 2+V W 1V CHEST  Result Date: 01/29/2022 CLINICAL DATA:  Vomiting EXAM: DG ABDOMEN ACUTE WITH 1 VIEW CHEST COMPARISON:  08/31/2021 FINDINGS: Single-view chest demonstrates low lung volumes. Vague opacity in the right mid lung. Stable cardiomediastinal silhouette. Low lung volumes. Supine and upright views of the abdomen demonstrate no free air beneath the diaphragm. Mild diffuse increased bowel gas without obstructive pattern, gas in the rectum. Moderate stool  in the colon. No radiopaque calculi. Vascular calcification IMPRESSION: 1. Possible vague right midlung opacity could be a focus of infection, suggest two-view follow-up chest radiograph 2. Mild diffuse increased bowel gas without obstructive pattern Electronically Signed   By: Jasmine Pang M.D.   On: 01/29/2022 17:26    Radiological Exams on Admission: DG ABD ACUTE 2+V W 1V CHEST  Result Date: 01/29/2022 CLINICAL DATA:  Vomiting EXAM: DG ABDOMEN ACUTE WITH 1 VIEW CHEST COMPARISON:  08/31/2021 FINDINGS: Single-view chest demonstrates low lung volumes. Vague opacity in the right mid lung. Stable cardiomediastinal silhouette. Low lung volumes. Supine and upright views of the abdomen demonstrate no free air beneath the diaphragm. Mild diffuse increased bowel gas without obstructive pattern, gas in the rectum. Moderate stool in the colon. No radiopaque calculi. Vascular calcification IMPRESSION: 1. Possible vague right midlung  opacity could be a focus of infection, suggest two-view follow-up chest radiograph 2. Mild diffuse increased bowel gas without obstructive pattern Electronically Signed   By: Jasmine Pang M.D.   On: 01/29/2022 17:26    DVT Prophylaxis -SCD (Gi bleed) AM Labs Ordered, also please review Full Orders  Family Communication: Admission, patients condition and plan of care including tests being ordered have been discussed with the patient and sister Ms Derrill Kay who indicate understanding and agree with the plan   Condition  -stable  Shon Hale M.D on 01/29/2022 at 10:36 PM Go to [email or web address] -  for contact info  [facility] - Office  [phone]

## 2022-01-29 NOTE — ED Triage Notes (Signed)
Patient brought in by [facility] for vomiting what appeared to be coffee grounds by staff.  Patient denies any pain and is conscious and alert at triage.

## 2022-01-29 NOTE — Progress Notes (Signed)
Pharmacy Antibiotic Note  Warren Sullivan is a 61 y.o. male admitted on 01/29/2022 with concern for aspiration pneumonia.  Pharmacy has been consulted for Unasyn dosing.  Plan: Unasyn 3g IV Q6H.  Height: 5\' 11"  (180.3 cm) Weight: 96.6 kg (213 lb) IBW/kg (Calculated) : 75.3  Temp (24hrs), Avg:98.5 F (36.9 C), Min:98 F (36.7 C), Max:98.6 F (37 C)  Recent Labs  Lab 01/29/22 1512  WBC 7.6  CREATININE 1.02    Estimated Creatinine Clearance: 89 mL/min (by C-G formula based on SCr of 1.02 mg/dL).    No Known Allergies   Thank you for allowing pharmacy to be a part of this patient's care.  01/31/22, PharmD, BCPS  01/29/2022 11:26 PM

## 2022-01-29 NOTE — ED Triage Notes (Signed)
Patient has hx of GI bleed.

## 2022-01-30 ENCOUNTER — Observation Stay (HOSPITAL_BASED_OUTPATIENT_CLINIC_OR_DEPARTMENT_OTHER): Payer: Medicare Other | Admitting: Certified Registered Nurse Anesthetist

## 2022-01-30 ENCOUNTER — Encounter (HOSPITAL_COMMUNITY)
Admission: EM | Disposition: A | Discharge status: Skilled Nursing Facility | Payer: Self-pay | Source: Home / Self Care | Attending: Emergency Medicine

## 2022-01-30 ENCOUNTER — Encounter (HOSPITAL_COMMUNITY): Payer: Self-pay | Admitting: Family Medicine

## 2022-01-30 ENCOUNTER — Other Ambulatory Visit: Payer: Self-pay

## 2022-01-30 ENCOUNTER — Observation Stay (HOSPITAL_COMMUNITY): Payer: Medicare Other | Admitting: Certified Registered Nurse Anesthetist

## 2022-01-30 DIAGNOSIS — I10 Essential (primary) hypertension: Secondary | ICD-10-CM

## 2022-01-30 DIAGNOSIS — K219 Gastro-esophageal reflux disease without esophagitis: Secondary | ICD-10-CM | POA: Diagnosis not present

## 2022-01-30 DIAGNOSIS — K920 Hematemesis: Secondary | ICD-10-CM

## 2022-01-30 DIAGNOSIS — K449 Diaphragmatic hernia without obstruction or gangrene: Secondary | ICD-10-CM

## 2022-01-30 DIAGNOSIS — K2289 Other specified disease of esophagus: Secondary | ICD-10-CM

## 2022-01-30 DIAGNOSIS — R1115 Cyclical vomiting syndrome unrelated to migraine: Secondary | ICD-10-CM | POA: Diagnosis not present

## 2022-01-30 DIAGNOSIS — F17210 Nicotine dependence, cigarettes, uncomplicated: Secondary | ICD-10-CM

## 2022-01-30 DIAGNOSIS — F0390 Unspecified dementia without behavioral disturbance: Secondary | ICD-10-CM | POA: Diagnosis not present

## 2022-01-30 HISTORY — PX: BIOPSY: SHX5522

## 2022-01-30 HISTORY — PX: ESOPHAGOGASTRODUODENOSCOPY (EGD) WITH PROPOFOL: SHX5813

## 2022-01-30 LAB — CBC
HCT: 36.3 % — ABNORMAL LOW (ref 39.0–52.0)
Hemoglobin: 11.9 g/dL — ABNORMAL LOW (ref 13.0–17.0)
MCH: 29.6 pg (ref 26.0–34.0)
MCHC: 32.8 g/dL (ref 30.0–36.0)
MCV: 90.3 fL (ref 80.0–100.0)
Platelets: 142 10*3/uL — ABNORMAL LOW (ref 150–400)
RBC: 4.02 MIL/uL — ABNORMAL LOW (ref 4.22–5.81)
RDW: 13.4 % (ref 11.5–15.5)
WBC: 6.2 10*3/uL (ref 4.0–10.5)
nRBC: 0 % (ref 0.0–0.2)

## 2022-01-30 LAB — BASIC METABOLIC PANEL
Anion gap: 6 (ref 5–15)
BUN: 20 mg/dL (ref 8–23)
CO2: 29 mmol/L (ref 22–32)
Calcium: 8.8 mg/dL — ABNORMAL LOW (ref 8.9–10.3)
Chloride: 105 mmol/L (ref 98–111)
Creatinine, Ser: 1.17 mg/dL (ref 0.61–1.24)
GFR, Estimated: >60 mL/min (ref >60)
Glucose, Bld: 100 mg/dL — ABNORMAL HIGH (ref 70–99)
Potassium: 3.8 mmol/L (ref 3.5–5.1)
Sodium: 140 mmol/L (ref 135–145)

## 2022-01-30 LAB — PROTIME-INR
INR: 1.1 (ref 0.8–1.2)
Prothrombin Time: 14.3 seconds (ref 11.4–15.2)

## 2022-01-30 SURGERY — ESOPHAGOGASTRODUODENOSCOPY (EGD) WITH PROPOFOL
Anesthesia: General

## 2022-01-30 MED ORDER — PROPOFOL 10 MG/ML IV BOLUS
INTRAVENOUS | Status: DC | PRN
  Administered 2022-01-30: 100 mg via INTRAVENOUS

## 2022-01-30 MED ORDER — ALBUTEROL SULFATE (2.5 MG/3ML) 0.083% IN NEBU
2.5000 mg | INHALATION_SOLUTION | RESPIRATORY_TRACT | 12 refills | Status: AC | PRN
Start: 2022-01-30 — End: ?

## 2022-01-30 MED ORDER — LIDOCAINE HCL (CARDIAC) PF 100 MG/5ML IV SOSY
PREFILLED_SYRINGE | INTRAVENOUS | Status: DC | PRN
  Administered 2022-01-30: 50 mg via INTRAVENOUS

## 2022-01-30 MED ORDER — LOSARTAN POTASSIUM 25 MG PO TABS: 25.0000 mg | ORAL_TABLET | Freq: Every day | ORAL | Status: DC

## 2022-01-30 MED ORDER — PROPOFOL 500 MG/50ML IV EMUL
INTRAVENOUS | Status: DC | PRN
  Administered 2022-01-30: 150 ug/kg/min via INTRAVENOUS

## 2022-01-30 MED ORDER — SODIUM CHLORIDE 0.9 % IV SOLN: INTRAVENOUS | Status: DC

## 2022-01-30 MED ORDER — LACTATED RINGERS IV SOLN: INTRAVENOUS | Status: DC

## 2022-01-30 MED ORDER — AMOXICILLIN-POT CLAVULANATE 875-125 MG PO TABS: 1.0000 | ORAL_TABLET | Freq: Two times a day (BID) | ORAL | 0 refills | Status: AC

## 2022-01-30 MED ORDER — LACTATED RINGERS IV SOLN: INTRAVENOUS | Status: DC | PRN

## 2022-01-30 NOTE — Discharge Summary (Signed)
Physician Discharge Summary  Warren Sullivan:[id number] DOB: [birth year] DOA: 01/29/2022  PCP: Sherol Dade, DO GI: Levon Hedger   Admit date: 01/29/2022 Discharge date: 01/30/2022  Admitted From:  SNF  Disposition: SNF  Recommendations for Outpatient Follow-up:  Follow up with GI in 3-4 weeks Follow up with PCP in 1-2 weeks Please obtain BMP/CBC in 1-2 weeks  Discharge Condition: STABLE   CODE STATUS: FULL  DIET: Soft foods    Brief Hospitalization Summary: Please see all hospital notes, images, labs for full details of the hospitalization. Warren Sullivan  is a 61 y.o. male who is a reformed alcoholic with past medical history significant of prior stroke with some degree of vascular dementia,, hypertension, hyperlipidemia,  BPH who presents to the emergency department from [facility] with recurrent episodes of emesis and concerns for possible coffee-ground--- in the ED patient had another episode of projectile vomiting, Gastroccult was positive -Patient has had episode of emesis on and off, had EGD on 08/18/2021 and repeat EGD on 10/03/2021,  biopsies consistent with H. pylori infection patient was treated, finding of hiatal hernia and erythematous duodenopathy -Previous abdominal imaging/CT scan suggested possible gastrointestinal malignancy however EGDs x2 as above with biopsies negative for malignancy -Patient is a poor historian due to underlying vascular dementia, -Additional history obtained from patient's sister [name] at bedside -X-rays of the chest and abdomen with possible mid right lung findings otherwise no obstructive pattern on abdominal x-rays -CBC WNL, Hgb 14.6 -Creatinine 1.0, LFTs are not elevated -Discussed with GI service recommend n.p.o. overnight for possible EGD in a.m.  Hospital Course   Pt was admitted for observation after an episode of coffee ground emesis.  His vitals and labs are stable. He was seen by GI service today.  Pt was sent for EGD.   Pt also treated for presumed mild case of aspiration pneumonia. He will discharge on 3 more days of oral augmentin.   EGD Findings from Dr. Levon Hedger  SURGEON:  Surgeon(s) and Role:    * Dolores Frame, MD - Primary   Patient underwent EGD under propofol sedation.  Tolerated the procedure adequately.  A 4 cm hiatal hernia was present. A single 2 mm mucosal nodule was found in the lower third of the esophagus, 37 cm from the incisors.  Biopsies were taken with a cold forceps for histology. The entire examined stomach was normal.  Biopsies were taken with a cold forceps for Helicobacter pylori testing. The examined duodenum was normal.    RECOMMENDATIONS - Return patient to hospital ward for ongoing care, but can be discharged to NH from GI perspective.  - Resume previous diet.  - Await pathology results.  - Continue pantoprazole 40 mg twice a day PO  GI reported patient is stable from GI standpoint to return to SNF today.  Pt is discharging to SNF.   Discharge Diagnoses:  Principal Problem:   Emesis, persistent Active Problems:   Essential hypertension   GERD (gastroesophageal reflux disease)   Dementia without behavioral disturbance (HCC)   Discharge Instructions:  Allergies as of 01/30/2022   No Known Allergies      Medication List     TAKE these medications    acetaminophen 650 MG CR tablet Commonly known as: TYLENOL Take 650 mg by mouth every 8 (eight) hours as needed for pain.   albuterol (2.5 MG/3ML) 0.083% nebulizer solution Commonly known as: PROVENTIL Take 3 mLs (2.5 mg total) by nebulization every 4 (four) hours as needed for wheezing or  shortness of breath.   amoxicillin-clavulanate 875-125 MG tablet Commonly known as: AUGMENTIN Take 1 tablet by mouth 2 (two) times daily for 3 days. Start taking on: January 31, 2022   aspirin EC 81 MG tablet Take 1 tablet (81 mg total) by mouth daily with breakfast. Swallow whole.   atorvastatin 20 MG  tablet Commonly known as: LIPITOR Take 1 tablet (20 mg total) by mouth daily.   CALCIUM 500 PO Take 1 tablet by mouth daily.   Cholecalciferol 25 MCG (1000 UT) capsule Take 5,000 Units by mouth daily.   cloNIDine 0.1 MG tablet Commonly known as: CATAPRES Take 2 tablets (0.2 mg total) by mouth 2 (two) times daily.   gabapentin 300 MG capsule Commonly known as: NEURONTIN Take 1 capsule (300 mg total) by mouth 3 (three) times daily. Start with one at bedtime for 1 week. Then two at bedtime for one week, then go to full dose.   losartan 25 MG tablet Commonly known as: COZAAR Take 1 tablet (25 mg total) by mouth daily. Start taking on: February 02, 2022 What changed:  medication strength how much to take These instructions start on February 02, 2022. If you are unsure what to do until then, ask your doctor or other care provider.   memantine 10 MG tablet Commonly known as: NAMENDA Take 10 mg by mouth 2 (two) times daily.   multivitamin tablet Take 1 tablet by mouth daily.   pantoprazole 40 MG tablet Commonly known as: Protonix Take 1 tablet (40 mg total) by mouth 2 (two) times daily.   tamsulosin 0.4 MG Caps capsule Commonly known as: FLOMAX Take 0.4 mg by mouth daily.   Vascepa 1 g capsule Generic drug: icosapent Ethyl Take 2 g by mouth 2 (two) times daily.   venlafaxine 37.5 MG tablet Commonly known as: EFFEXOR Take 37.5 mg by mouth daily at 6 (six) AM.   vitamin B-12 500 MCG tablet Commonly known as: CYANOCOBALAMIN Take 500 mcg by mouth daily.        Contact information for follow-up providers     Marguerita Merles, Reuel Boom, MD. Schedule an appointment as soon as possible for a visit in 4 week(s).   Specialty: Gastroenterology Why: Hospital Follow Up Contact information: [address] [place] [address] [phone]         Sherol Dade, DO. Schedule an appointment as soon as possible for a visit in 1 week(s).   Specialty: Family  Medicine Why: Hospital Follow Up Contact information: [address] [place] [address] [phone]              Contact information for after-discharge care     Destination     [facility] .   Service: Skilled Nursing Contact information: [address] [place] [address] [phone]                    No Known Allergies Allergies as of 01/30/2022   No Known Allergies      Medication List     TAKE these medications    acetaminophen 650 MG CR tablet Commonly known as: TYLENOL Take 650 mg by mouth every 8 (eight) hours as needed for pain.   albuterol (2.5 MG/3ML) 0.083% nebulizer solution Commonly known as: PROVENTIL Take 3 mLs (2.5 mg total) by nebulization every 4 (four) hours as needed for wheezing or shortness of breath.   amoxicillin-clavulanate 875-125 MG tablet Commonly known as: AUGMENTIN Take 1  tablet by mouth 2 (two) times daily for 3 days. Start taking on: January 31, 2022   aspirin EC 81 MG tablet Take 1 tablet (81 mg total) by mouth daily with breakfast. Swallow whole.   atorvastatin 20 MG tablet Commonly known as: LIPITOR Take 1 tablet (20 mg total) by mouth daily.   CALCIUM 500 PO Take 1 tablet by mouth daily.   Cholecalciferol 25 MCG (1000 UT) capsule Take 5,000 Units by mouth daily.   cloNIDine 0.1 MG tablet Commonly known as: CATAPRES Take 2 tablets (0.2 mg total) by mouth 2 (two) times daily.   gabapentin 300 MG capsule Commonly known as: NEURONTIN Take 1 capsule (300 mg total) by mouth 3 (three) times daily. Start with one at bedtime for 1 week. Then two at bedtime for one week, then go to full dose.   losartan 25 MG tablet Commonly known as: COZAAR Take 1 tablet (25 mg total) by mouth daily. Start taking on: February 02, 2022 What changed:  medication strength how much to take These instructions start on February 02, 2022. If you are unsure what to do until then, ask your  doctor or other care provider.   memantine 10 MG tablet Commonly known as: NAMENDA Take 10 mg by mouth 2 (two) times daily.   multivitamin tablet Take 1 tablet by mouth daily.   pantoprazole 40 MG tablet Commonly known as: Protonix Take 1 tablet (40 mg total) by mouth 2 (two) times daily.   tamsulosin 0.4 MG Caps capsule Commonly known as: FLOMAX Take 0.4 mg by mouth daily.   Vascepa 1 g capsule Generic drug: icosapent Ethyl Take 2 g by mouth 2 (two) times daily.   venlafaxine 37.5 MG tablet Commonly known as: EFFEXOR Take 37.5 mg by mouth daily at 6 (six) AM.   vitamin B-12 500 MCG tablet Commonly known as: CYANOCOBALAMIN Take 500 mcg by mouth daily.        Procedures/Studies: DG ABD ACUTE 2+V W 1V CHEST  Result Date: 01/29/2022 CLINICAL DATA:  Vomiting EXAM: DG ABDOMEN ACUTE WITH 1 VIEW CHEST COMPARISON:  08/31/2021 FINDINGS: Single-view chest demonstrates low lung volumes. Vague opacity in the right mid lung. Stable cardiomediastinal silhouette. Low lung volumes. Supine and upright views of the abdomen demonstrate no free air beneath the diaphragm. Mild diffuse increased bowel gas without obstructive pattern, gas in the rectum. Moderate stool in the colon. No radiopaque calculi. Vascular calcification IMPRESSION: 1. Possible vague right midlung opacity could be a focus of infection, suggest two-view follow-up chest radiograph 2. Mild diffuse increased bowel gas without obstructive pattern Electronically Signed   By: Jasmine Pang M.D.   On: 01/29/2022 17:26     Subjective: Pt without complaints today.    Discharge Exam: Vitals:   01/30/22 1145 01/30/22 1219  BP: 114/61 (!) 144/79  Pulse: (!) 50 (!) 51  Resp: 18 18  Temp: 97.9 F (36.6 C) (!) 97.5 F (36.4 C)  SpO2: 93% 98%   Vitals:   01/30/22 1125 01/30/22 1130 01/30/22 1145 01/30/22 1219  BP: 91/62 90/64 114/61 (!) 144/79  Pulse: (!) 59 (!) 57 (!) 50 (!) 51  Resp: 16 14 18 18   Temp:   97.9 F (36.6 C)  (!) 97.5 F (36.4 C)  TempSrc:    Axillary  SpO2: 92% 96% 93% 98%  Weight:      Height:       General: Pt is alert, awake, not in acute distress Cardiovascular: normal S1/S2 +, no rubs, no  gallops Respiratory: CTA bilaterally, no wheezing, no rhonchi Abdominal: Soft, NT, ND, bowel sounds + Extremities: no edema, no cyanosis   The results of significant diagnostics from this hospitalization (including imaging, microbiology, ancillary and laboratory) are listed below for reference.     Microbiology: No results found for this or any previous visit (from the past 240 hour(s)).   Labs: BNP (last 3 results) No results for input(s): "BNP" in the last 8760 hours. Basic Metabolic Panel: Recent Labs  Lab 01/29/22 1512 01/30/22 0525  NA 141 140  K 4.0 3.8  CL 102 105  CO2 31 29  GLUCOSE 108* 100*  BUN 21 20  CREATININE 1.02 1.17  CALCIUM 9.2 8.8*   Liver Function Tests: Recent Labs  Lab 01/29/22 1512  AST 19  ALT 23  ALKPHOS 80  BILITOT 0.7  PROT 8.3*  ALBUMIN 4.1   No results for input(s): "LIPASE", "AMYLASE" in the last 168 hours. No results for input(s): "AMMONIA" in the last 168 hours. CBC: Recent Labs  Lab 01/29/22 1512 01/30/22 0525  WBC 7.6 6.2  HGB 14.6 11.9*  HCT 44.2 36.3*  MCV 89.7 90.3  PLT 163 142*   Cardiac Enzymes: No results for input(s): "CKTOTAL", "CKMB", "CKMBINDEX", "TROPONINI" in the last 168 hours. BNP: Invalid input(s): "POCBNP" CBG: No results for input(s): "GLUCAP" in the last 168 hours. D-Dimer No results for input(s): "DDIMER" in the last 72 hours. Hgb A1c No results for input(s): "HGBA1C" in the last 72 hours. Lipid Profile No results for input(s): "CHOL", "HDL", "LDLCALC", "TRIG", "CHOLHDL", "LDLDIRECT" in the last 72 hours. Thyroid function studies No results for input(s): "TSH", "T4TOTAL", "T3FREE", "THYROIDAB" in the last 72 hours.  Invalid input(s): "FREET3" Anemia work up No results for input(s): "VITAMINB12",  "FOLATE", "FERRITIN", "TIBC", "IRON", "RETICCTPCT" in the last 72 hours. Urinalysis    Component Value Date/Time   COLORURINE YELLOW 08/30/2021 0320   APPEARANCEUR CLEAR 08/30/2021 0320   LABSPEC 1.016 08/30/2021 0320   PHURINE 5.0 08/30/2021 0320   GLUCOSEU NEGATIVE 08/30/2021 0320   HGBUR SMALL (A) 08/30/2021 0320   BILIRUBINUR NEGATIVE 08/30/2021 0320   KETONESUR NEGATIVE 08/30/2021 0320   PROTEINUR NEGATIVE 08/30/2021 0320   UROBILINOGEN 0.2 02/19/2013 1857   NITRITE NEGATIVE 08/30/2021 0320   LEUKOCYTESUR NEGATIVE 08/30/2021 0320   Sepsis Labs Recent Labs  Lab 01/29/22 1512 01/30/22 0525  WBC 7.6 6.2   Microbiology No results found for this or any previous visit (from the past 240 hour(s)).  Time coordinating discharge:   SIGNED:  Standley Dakins, MD  [facility] 01/30/2022, 12:49 PM How to contact the [facility] Attending or Consulting provider 7A - 7P or covering provider during after hours 7P -7A, for this patient?  Check the care team in [facility] and look for a) attending/consulting [facility] provider listed and b) the [facility] team listed Log into [email or web address] and use [place]'s universal password to access. If you do not have the password, please contact the hospital operator. Locate the [facility] provider you are looking for under [facility] and page to a number that you can be directly reached. If you still have difficulty reaching the provider, please page the [facility] (Director on Call) for the Hospitalists listed on amion for assistance.

## 2022-01-30 NOTE — Progress Notes (Signed)
Ng Discharge Note  Admit Date:  01/29/2022 Discharge date: 01/30/2022   Warren Sullivan to be D/C'd Skilled nursing facility per MD order.  AVS completed. Patient/caregiver able to verbalize understanding.  Discharge Medication: Allergies as of 01/30/2022   No Known Allergies      Medication List     TAKE these medications    acetaminophen 650 MG CR tablet Commonly known as: TYLENOL Take 650 mg by mouth every 8 (eight) hours as needed for pain.   albuterol (2.5 MG/3ML) 0.083% nebulizer solution Commonly known as: PROVENTIL Take 3 mLs (2.5 mg total) by nebulization every 4 (four) hours as needed for wheezing or shortness of breath.   amoxicillin-clavulanate 875-125 MG tablet Commonly known as: AUGMENTIN Take 1 tablet by mouth 2 (two) times daily for 3 days. Start taking on: January 31, 2022   aspirin EC 81 MG tablet Take 1 tablet (81 mg total) by mouth daily with breakfast. Swallow whole.   atorvastatin 20 MG tablet Commonly known as: LIPITOR Take 1 tablet (20 mg total) by mouth daily.   CALCIUM 500 PO Take 1 tablet by mouth daily.   Cholecalciferol 25 MCG (1000 UT) capsule Take 5,000 Units by mouth daily.   cloNIDine 0.1 MG tablet Commonly known as: CATAPRES Take 2 tablets (0.2 mg total) by mouth 2 (two) times daily.   gabapentin 300 MG capsule Commonly known as: NEURONTIN Take 1 capsule (300 mg total) by mouth 3 (three) times daily. Start with one at bedtime for 1 week. Then two at bedtime for one week, then go to full dose.   losartan 25 MG tablet Commonly known as: COZAAR Take 1 tablet (25 mg total) by mouth daily. Start taking on: February 02, 2022 What changed:  medication strength how much to take These instructions start on February 02, 2022. If you are unsure what to do until then, ask your doctor or other care provider.   memantine 10 MG tablet Commonly known as: NAMENDA Take 10 mg by mouth 2 (two) times daily.   multivitamin tablet Take 1 tablet by  mouth daily.   pantoprazole 40 MG tablet Commonly known as: Protonix Take 1 tablet (40 mg total) by mouth 2 (two) times daily.   tamsulosin 0.4 MG Caps capsule Commonly known as: FLOMAX Take 0.4 mg by mouth daily.   Vascepa 1 g capsule Generic drug: icosapent Ethyl Take 2 g by mouth 2 (two) times daily.   venlafaxine 37.5 MG tablet Commonly known as: EFFEXOR Take 37.5 mg by mouth daily at 6 (six) AM.   vitamin B-12 500 MCG tablet Commonly known as: CYANOCOBALAMIN Take 500 mcg by mouth daily.        Discharge Assessment: Vitals:   01/30/22 1219 01/30/22 1353  BP: (!) 144/79 131/73  Pulse: (!) 51 (!) 57  Resp: 18 19  Temp: (!) 97.5 F (36.4 C) 98.4 F (36.9 C)  SpO2: 98% 94%   Skin clean, dry and intact without evidence of skin break down, no evidence of skin tears noted. IV catheter discontinued intact. Site without signs and symptoms of complications - no redness or edema noted at insertion site, patient denies c/o pain - only slight tenderness at site.  Dressing with slight pressure applied.  D/c Instructions-Education: Discharge instructions given to patient/family with verbalized understanding. D/c education completed with patient/family including follow up instructions, medication list, d/c activities limitations if indicated, with other d/c instructions as indicated by MD - patient able to verbalize understanding, all questions fully answered.  Patient instructed to return to ED, call 911, or call MD for any changes in condition.  Patient escorted via stretcher and D/C to [facility] via EMS.  Cristal Ford, RN 01/30/2022 1:56 PM

## 2022-01-30 NOTE — TOC Transition Note (Signed)
Transition of Care [facility]) - CM/SW Discharge Note   Patient Details  Name: Warren Sullivan MRN: [id number] Date of Birth: [birth year]  [facility] CM/SW Contact:  Leitha Bleak, RN Phone Number: 01/30/2022, 1:19 PM   Clinical Narrative:  Patient  discharging back to [facility], [place] updated patient's sister, Mardene Celeste. RN to call report. Med necessity printed, TOC will call EMS when RN is ready.   Final next level of care: Skilled Nursing Facility Barriers to Discharge: Barriers Resolved   Patient Goals and CMS Choice Patient states their goals for this hospitalization and ongoing recovery are:: return to [email or web address].gov Compare Post Acute Care list provided to:: Patient Choice offered to / list presented to : Patient  Discharge Placement          Patient to be transferred to facility by: EMS Name of family member notified: Mardene Celeste Patient and family notified of of transfer: 01/30/22   Readmission Risk Interventions     No data to display

## 2022-01-30 NOTE — Anesthesia Preprocedure Evaluation (Signed)
Anesthesia Evaluation  Patient identified by MRN, date of birth, ID band Patient awake    Reviewed: Allergy & Precautions, H&P , NPO status , Patient's Chart, lab work & pertinent test results, reviewed documented beta blocker date and time   Airway Mallampati: II  TM Distance: >3 FB Neck ROM: full    Dental no notable dental hx. (+) Edentulous Upper, Edentulous Lower   Pulmonary neg pulmonary ROS, Current Smoker,    Pulmonary exam normal breath sounds clear to auscultation       Cardiovascular Exercise Tolerance: Good hypertension, negative cardio ROS   Rhythm:regular Rate:Normal     Neuro/Psych PSYCHIATRIC DISORDERS Depression Dementia CVA    GI/Hepatic Neg liver ROS, GERD  Medicated,  Endo/Other  negative endocrine ROS  Renal/GU negative Renal ROS  negative genitourinary   Musculoskeletal   Abdominal   Peds  Hematology negative hematology ROS (+)   Anesthesia Other Findings   Reproductive/Obstetrics negative OB ROS                             Anesthesia Physical  Anesthesia Plan  ASA: 4 and emergent  Anesthesia Plan: General   Post-op Pain Management:    Induction:   PONV Risk Score and Plan: Propofol infusion  Airway Management Planned:   Additional Equipment:   Intra-op Plan:   Post-operative Plan:   Informed Consent: I have reviewed the patients History and Physical, chart, labs and discussed the procedure including the risks, benefits and alternatives for the proposed anesthesia with the patient or authorized representative who has indicated his/her understanding and acceptance.     Dental Advisory Given  Plan Discussed with: CRNA  Anesthesia Plan Comments:         Anesthesia Quick Evaluation

## 2022-01-30 NOTE — Discharge Instructions (Signed)
IMPORTANT INFORMATION: PAY CLOSE ATTENTION   PHYSICIAN DISCHARGE INSTRUCTIONS  Follow with Primary care provider  Simpson-Tarokh, Leann, DO  and other consultants as instructed by your Hospitalist Physician  SEEK MEDICAL CARE OR RETURN TO EMERGENCY ROOM IF SYMPTOMS COME BACK, WORSEN OR NEW PROBLEM DEVELOPS   Please note: You were cared for by a hospitalist during your hospital stay. Every effort will be made to forward records to your primary care provider.  You can request that your primary care provider send for your hospital records if they have not received them.  Once you are discharged, your primary care physician will handle any further medical issues. Please note that NO REFILLS for any discharge medications will be authorized once you are discharged, as it is imperative that you return to your primary care physician (or establish a relationship with a primary care physician if you do not have one) for your post hospital discharge needs so that they can reassess your need for medications and monitor your lab values.  Please get a complete blood count and chemistry panel checked by your Primary MD at your next visit, and again as instructed by your Primary MD.  Get Medicines reviewed and adjusted: Please take all your medications with you for your next visit with your Primary MD  Laboratory/radiological data: Please request your Primary MD to go over all hospital tests and procedure/radiological results at the follow up, please ask your primary care provider to get all Hospital records sent to his/her office.  In some cases, they will be blood work, cultures and biopsy results pending at the time of your discharge. Please request that your primary care provider follow up on these results.  If you are diabetic, please bring your blood sugar readings with you to your follow up appointment with primary care.    Please call and make your follow up appointments as soon as possible.    Also  Note the following: If you experience worsening of your admission symptoms, develop shortness of breath, life threatening emergency, suicidal or homicidal thoughts you must seek medical attention immediately by calling 911 or calling your MD immediately  if symptoms less severe.  You must read complete instructions/literature along with all the possible adverse reactions/side effects for all the Medicines you take and that have been prescribed to you. Take any new Medicines after you have completely understood and accpet all the possible adverse reactions/side effects.   Do not drive when taking Pain medications or sleeping medications (Benzodiazepines)  Do not take more than prescribed Pain, Sleep and Anxiety Medications. It is not advisable to combine anxiety,sleep and pain medications without talking with your primary care practitioner  Special Instructions: If you have smoked or chewed Tobacco  in the last 2 yrs please stop smoking, stop any regular Alcohol  and or any Recreational drug use.  Wear Seat belts while driving.  Do not drive if taking any narcotic, mind altering or controlled substances or recreational drugs or alcohol.       

## 2022-01-30 NOTE — Op Note (Signed)
[facility] Patient Name: Warren Sullivan Procedure Date: 01/30/2022 10:55 AM MRN: [id number] Date of Birth: 1960-11-29 Attending MD: Katrinka Blazing ,  CSN: [id number] Age: 61 Admit Type: Outpatient Procedure:                Upper GI endoscopy Indications:              Coffee-ground emesis Providers:                Katrinka Blazing, Buel Ream. Thomasena Edis RN, RN,                            Hinton Rao, Dyann Ruddle Referring MD:              Medicines:                Monitored Anesthesia Care Complications:            No immediate complications. Estimated Blood Loss:     Estimated blood loss: none. Procedure:                Pre-Anesthesia Assessment:                           - Prior to the procedure, a History and Physical                            was performed, and patient medications, allergies                            and sensitivities were reviewed. The patient's                            tolerance of previous anesthesia was reviewed.                           - The risks and benefits of the procedure and the                            sedation options and risks were discussed with the                            patient. All questions were answered and informed                            consent was obtained.                           - ASA Grade Assessment: II - A patient with mild                            systemic disease.                           After obtaining informed consent, the endoscope was                            passed under direct vision. Throughout the  procedure, the patient's blood pressure, pulse, and                            oxygen saturations were monitored continuously. The                            [id number]-H190 ([id number]) scope was introduced through the                            mouth, and advanced to the second part of duodenum.                            The upper GI endoscopy was accomplished without                             difficulty. The patient tolerated the procedure                            well. Scope In: 11:06:39 AM Scope Out: 11:16:58 AM Total Procedure Duration: 0 hours 10 minutes 19 seconds  Findings:      A 4 cm hiatal hernia was present.      A single 2 mm mucosal nodule was found in the lower third of the       esophagus, 37 cm from the incisors. Biopsies were taken with a cold       forceps for histology.      The entire examined stomach was normal. Biopsies were taken with a cold       forceps for Helicobacter pylori testing.      The examined duodenum was normal.      NOTE: I called his sister to inform findings and recs Impression:               - 4 cm hiatal hernia.                           - Mucosal nodule found in the esophagus. Biopsied.                           - Normal stomach. Biopsied.                           - Normal examined duodenum. Moderate Sedation:      Per Anesthesia Care Recommendation:           - Return patient to hospital ward for ongoing care,                            but can be discharged to NH from [facility].                           - Resume previous diet.                           - Await pathology results.                           -  Continue pantoprazole 40 mg twice a day PO Procedure Code(s):        --- Professional ---                           [phone], Esophagogastroduodenoscopy, flexible,                            transoral; with biopsy, single or multiple Diagnosis Code(s):        --- Professional ---                           K44.9, Diaphragmatic hernia without obstruction or                            gangrene                           K22.8, Other specified diseases of esophagus                           K92.0, Hematemesis CPT copyright 2019 American Medical Association. All rights reserved. The codes documented in this report are preliminary and upon coder review may  be revised to meet current compliance  requirements. Katrinka Blazing, MD Katrinka Blazing,  01/30/2022 11:26:46 AM This report has been signed electronically. Number of Addenda: 0

## 2022-01-30 NOTE — Consult Note (Signed)
@LOGO @   Referring Provider: , PA-C Primary Care Physician:  Aline August, DO Primary Gastroenterologist:  Dr. Sherol Dade  Date of Admission: 01/29/22 Date of Consultation: 01/30/22  Reason for Consultation:  Coffee ground emesis  HPI:  Warren Sullivan is a 61 y.o. year old male who resides at [facility] skilled nursing facility with history of dementia, CVA, hemiaplasia, HTN, HLD, depression, GERD, coffee-ground emesis in February 2023 found to have esophagitis with ulceration, peptic duodenitis, active chronic gastritis with H. pylori treated with bismuth quad therapy, repeat EGD in March with erythematous duodenopathy, mild carditis, chronic gastritis with H. pylori s/p treatment with levofloxacin salvage therapy. Patient presented to the emergency room 7/24 from his skilled nursing facility after he had an episode of vomiting that appeared to be coffee grounds.  While in the ED, he also had vomiting that was dark brown in color with some red streaking that was heme positive.  ED Course:  Hypertensive, but otherwise vital signs stable. Hemoglobin 14.6, white blood cell count 7.6, LFTs, kidney function, electrolytes within normal limits. Abdominal and chest x-ray with possible vague right midlung opacity which could be a focus of infection with suggestion for two-view follow-up.  Also with mild diffuse increased bowel gas without obstructive pattern. He was given Zofran, Reglan, IV fluids, and started on PPI infusion.  Pharmacy was also consulted due due to concerns for aspiration pneumonia and he was started on Unasyn.  Today:  Patient's sister 8/24 at bedside. Reports patient has been doing well. She goes to see him every Sunday. Has been eating normally without any complaints of nausea, vomiting, or abdominal pain. Reports he had a couple episodes of vomiting yesterday, but none since. Patient reports he is feeling fine this morning. Had nausea and vomiting yesterday.  Thinks he ate something that didn't settle well with him. No nausea or vomiting today. Denies abdominal pain, heartburn symptoms, dysphagia, change in bowel habits, brbpr, or melena.   Spoke with nursing staff.  No further reports of emesis since being in the emergency room.  He has not had BRBPR or melena.  Past Medical History:  Diagnosis Date   CVA (cerebral vascular accident) (HCC)    Dementia in other diseases classified elsewhere, severe, without behavioral disturbance, psychotic disturbance, mood disturbance, and anxiety (HCC)    GERD (gastroesophageal reflux disease)    Hemiplegia, unspecified affecting right dominant side (HCC)    Hyperlipidemia    Hypertension    MDD (major depressive disorder)    Stroke (HCC)    Vitamin D deficiency     Past Surgical History:  Procedure Laterality Date   BIOPSY  08/18/2021   Procedure: BIOPSY;  Surgeon: 10/16/2021, MD;  [facility];  Service: Gastroenterology;;  Dolores Frame and distal esophagus biopsies    BIOPSY  10/03/2021   Procedure: BIOPSY;  Surgeon: 10/05/2021, MD;  [facility]: [facility];  Service: Gastroenterology;;   ESOPHAGOGASTRODUODENOSCOPY (EGD) WITH PROPOFOL N/A 08/18/2021   Procedure: ESOPHAGOGASTRODUODENOSCOPY (EGD) WITH PROPOFOL;  Surgeon: 10/16/2021, MD;  [facility]: [facility];  Service: Gastroenterology;  Laterality: N/A;   ESOPHAGOGASTRODUODENOSCOPY (EGD) WITH PROPOFOL N/A 10/03/2021   Procedure: ESOPHAGOGASTRODUODENOSCOPY (EGD) WITH PROPOFOL;  Surgeon: 10/05/2021, MD;  Location: [facility];  Service: Gastroenterology;  Laterality: N/A;  1015 ASA 1 In [facility]   HERNIA REPAIR      Prior to Admission medications   Medication Sig Start Date End Date Taking? Authorizing Provider  acetaminophen (TYLENOL)  650 MG CR tablet Take 650 mg by mouth every 8 (eight) hours as needed for pain.   Yes [provider]  aspirin EC 81 MG  tablet Take 1 tablet (81 mg total) by mouth daily with breakfast. Swallow whole. 08/20/21  Yes Emokpae, Courage, MD  atorvastatin (LIPITOR) 20 MG tablet Take 1 tablet (20 mg total) by mouth daily. 08/20/21  Yes Emokpae, Courage, MD  Calcium Carbonate (CALCIUM 500 PO) Take 1 tablet by mouth daily.   Yes [provider]  Cholecalciferol 25 MCG (1000 UT) capsule Take 5,000 Units by mouth daily.   Yes [provider]  cloNIDine (CATAPRES) 0.1 MG tablet Take 2 tablets (0.2 mg total) by mouth 2 (two) times daily. 08/20/21  Yes Emokpae, Courage, MD  gabapentin (NEURONTIN) 300 MG capsule Take 1 capsule (300 mg total) by mouth 3 (three) times daily. Start with one at bedtime for 1 week. Then two at bedtime for one week, then go to full dose. 10/19/15  Yes Stacks, Broadus John, MD  losartan (COZAAR) 50 MG tablet Take 1 tablet (50 mg total) by mouth daily. 08/31/21  Yes Emokpae, Courage, MD  memantine (NAMENDA) 10 MG tablet Take 10 mg by mouth 2 (two) times daily. 07/24/21  Yes [provider]  Multiple Vitamin (MULTIVITAMIN) tablet Take 1 tablet by mouth daily.   Yes [provider]  pantoprazole (PROTONIX) 40 MG tablet Take 1 tablet (40 mg total) by mouth 2 (two) times daily. 08/20/21 08/20/22 Yes Emokpae, Courage, MD  tamsulosin (FLOMAX) 0.4 MG CAPS capsule Take 0.4 mg by mouth daily. 07/11/21  Yes [provider]  VASCEPA 1 g capsule Take 2 g by mouth 2 (two) times daily. 08/08/21  Yes [provider]  venlafaxine (EFFEXOR) 37.5 MG tablet Take 37.5 mg by mouth daily at 6 (six) AM.   Yes [provider]  vitamin B-12 (CYANOCOBALAMIN) 500 MCG tablet Take 500 mcg by mouth daily.   Yes [provider]    Current Facility-Administered Medications  Medication Dose Route Frequency Provider Last Rate Last Admin   0.9 %  sodium chloride infusion   Intravenous PRN Emokpae, Courage, MD       acetaminophen (TYLENOL) tablet 650 mg  650 mg Oral Q6H PRN Emokpae,  Courage, MD       Or   acetaminophen (TYLENOL) suppository 650 mg  650 mg Rectal Q6H PRN Emokpae, Courage, MD       albuterol (PROVENTIL) (2.5 MG/3ML) 0.083% nebulizer solution 2.5 mg  2.5 mg Nebulization Q2H PRN Emokpae, Courage, MD       Ampicillin-Sulbactam (UNASYN) 3 g in sodium chloride 0.9 % 100 mL IVPB  3 g Intravenous Q6H Bryk, Veronda P, RPH 200 mL/hr at 01/30/22 0530 3 g at 01/30/22 0530   aspirin EC tablet 81 mg  81 mg Oral Q breakfast Emokpae, Courage, MD       atorvastatin (LIPITOR) tablet 20 mg  20 mg Oral Daily Emokpae, Courage, MD   20 mg at 01/29/22 2116   bisacodyl (DULCOLAX) suppository 10 mg  10 mg Rectal Daily PRN Shon Hale, MD       cloNIDine (CATAPRES) tablet 0.2 mg  0.2 mg Oral BID Emokpae, Courage, MD   0.2 mg at 01/29/22 2116   dextromethorphan-guaiFENesin (MUCINEX DM) 30-600 MG per 12 hr tablet 1 tablet  1 tablet Oral BID Emokpae, Courage, MD       gabapentin (NEURONTIN) capsule 300 mg  300 mg Oral TID Shon Hale, MD  300 mg at 01/29/22 2116   icosapent Ethyl (VASCEPA) 1 g capsule 2 g  2 g Oral BID Shon HaleEmokpae, Courage, MD   2 g at 01/29/22 2218   labetalol (NORMODYNE) injection 10 mg  10 mg Intravenous Q4H PRN Emokpae, Courage, MD       memantine (NAMENDA) tablet 10 mg  10 mg Oral BID Mariea ClontsEmokpae, Courage, MD   10 mg at 01/29/22 2116   multivitamin with minerals tablet 1 tablet  1 tablet Oral Daily Shon HaleEmokpae, Courage, MD   1 tablet at 01/29/22 1827   ondansetron (ZOFRAN) tablet 4 mg  4 mg Oral Q6H PRN Emokpae, Courage, MD       Or   ondansetron (ZOFRAN) injection 4 mg  4 mg Intravenous Q6H PRN Mariea ClontsEmokpae, Courage, MD       [START ON 02/02/2022] pantoprazole (PROTONIX) injection 40 mg  40 mg Intravenous Q12H Emokpae, Courage, MD       pantoprozole (PROTONIX) 80 mg /NS 100 mL infusion  8 mg/hr Intravenous Continuous Emokpae, Courage, MD 10 mL/hr at 01/30/22 0323 8 mg/hr at 01/30/22 0323   polyethylene glycol (MIRALAX / GLYCOLAX) packet 17 g  17 g Oral BID Emokpae,  Courage, MD   17 g at 01/29/22 1828   polyethylene glycol (MIRALAX / GLYCOLAX) packet 17 g  17 g Oral Daily PRN Shon HaleEmokpae, Courage, MD       senna-docusate (Senokot-S) tablet 2 tablet  2 tablet Oral QHS Emokpae, Courage, MD   2 tablet at 01/29/22 2117   sodium chloride flush (NS) 0.9 % injection 3 mL  3 mL Intravenous Q12H Emokpae, Courage, MD   3 mL at 01/29/22 2221   sodium chloride flush (NS) 0.9 % injection 3 mL  3 mL Intravenous Q12H Emokpae, Courage, MD   3 mL at 01/29/22 2221   sodium chloride flush (NS) 0.9 % injection 3 mL  3 mL Intravenous PRN Emokpae, Courage, MD       tamsulosin (FLOMAX) capsule 0.4 mg  0.4 mg Oral Daily Emokpae, Courage, MD   0.4 mg at 01/29/22 1827   venlafaxine XR (EFFEXOR-XR) 24 hr capsule 37.5 mg  37.5 mg Oral Q breakfast Emokpae, Courage, MD       vitamin B-12 (CYANOCOBALAMIN) tablet 500 mcg  500 mcg Oral Daily Emokpae, Courage, MD   500 mcg at 01/29/22 2218    Allergies as of 01/29/2022   (No Known Allergies)    Family History  Problem Relation Age of Onset   Hypertension Mother    Stroke Mother    Hypertension Father    Hypertension Sister     Social History   Socioeconomic History   Marital status: Divorced    Spouse name: Not on file   Number of children: Not on file   Years of education: Not on file   Highest education level: Not on file  Occupational History   Not on file  Tobacco Use   Smoking status: Every Day    Packs/day: 0.25    Types: Cigarettes   Smokeless tobacco: Not on file  Vaping Use   Vaping Use: Never used  Substance and Sexual Activity   Alcohol use: No   Drug use: No   Sexual activity: Not on file  Other Topics Concern   Not on file  Social History Narrative   Not on file   Social Determinants of Health   Financial Resource Strain: Not on file  Food Insecurity: Not on file  Transportation Needs: Not on file  Physical Activity: Not on file  Stress: Not on file  Social Connections: Not on file  Intimate  Partner Violence: Not on file    Review of Systems: Gen: Denies fever, chills, loss of appetite, cold or flu like symptoms. CV: Denies chest pain, heart palpitations.  Resp: Denies shortness of breath, cough.  GI: See HPI GU : See HPI Heme: See HPI  Physical Exam: Vital signs in last 24 hours: Temp:  [98 F (36.7 C)-98.6 F (37 C)] 98.4 F (36.9 C) (07/25 0602) Pulse Rate:  [52-73] 53 (07/25 0602) Resp:  [14-20] 20 (07/25 0602) BP: (105-197)/(53-99) 123/66 (07/25 0602) SpO2:  [93 %-100 %] 95 % (07/25 0602) Weight:  [96.6 kg] 96.6 kg (07/24 1308) Last BM Date : 01/29/22 General:   Alert,  Well-developed, well-nourished, pleasant and cooperative in NAD Head:  Normocephalic and atraumatic. Eyes:  Sclera clear, no icterus.   Conjunctiva pink. Ears:  Normal auditory acuity. Lungs:  Clear throughout to auscultation.   No wheezes, crackles, or rhonchi. No acute distress. Heart:  Regular rate and rhythm; no murmurs, clicks, rubs,  or gallops. Abdomen:  Soft, nontender and nondistended. No masses, hepatosplenomegaly or hernias noted. Normal bowel sounds, without guarding, and without rebound.   Rectal:  Deferred  Msk:  Symmetrical without gross deformities. Normal posture. Extremities:  Without edema. Neurologic:  Alert and  oriented to person, place, and situation; grossly normal neurologically. Skin:  Intact without significant lesions or rashes. Psych: Normal mood and affect.   Intake/Output from previous day: 07/24 0701 - 07/25 0700 In: 2830.6 [I.V.:17.6; IV Piggyback:2813] Out: -  Intake/Output this shift: No intake/output data recorded.  Lab Results: Recent Labs    01/29/22 1512 01/30/22 0525  WBC 7.6 6.2  HGB 14.6 11.9*  HCT 44.2 36.3*  PLT 163 142*   BMET Recent Labs    01/29/22 1512 01/30/22 0525  NA 141 140  K 4.0 3.8  CL 102 105  CO2 31 29  GLUCOSE 108* 100*  BUN 21 20  CREATININE 1.02 1.17  CALCIUM 9.2 8.8*   LFT Recent Labs    01/29/22 1512   PROT 8.3*  ALBUMIN 4.1  AST 19  ALT 23  ALKPHOS 80  BILITOT 0.7   PT/INR Recent Labs    01/30/22 0525  LABPROT 14.3  INR 1.1    Studies/Results: DG ABD ACUTE 2+V W 1V CHEST  Result Date: 01/29/2022 CLINICAL DATA:  Vomiting EXAM: DG ABDOMEN ACUTE WITH 1 VIEW CHEST COMPARISON:  08/31/2021 FINDINGS: Single-view chest demonstrates low lung volumes. Vague opacity in the right mid lung. Stable cardiomediastinal silhouette. Low lung volumes. Supine and upright views of the abdomen demonstrate no free air beneath the diaphragm. Mild diffuse increased bowel gas without obstructive pattern, gas in the rectum. Moderate stool in the colon. No radiopaque calculi. Vascular calcification IMPRESSION: 1. Possible vague right midlung opacity could be a focus of infection, suggest two-view follow-up chest radiograph 2. Mild diffuse increased bowel gas without obstructive pattern Electronically Signed   By: Jasmine Pang M.D.   On: 01/29/2022 17:26    Impression: 61 y.o. year old male who resides at [facility] skilled nursing facility with history of dementia, CVA, hemiaplasia, HTN, HLD, depression, GERD, coffee-ground emesis in February 2023 found to have esophagitis with ulceration, peptic duodenitis, active chronic gastritis with H. pylori treated with bismuth quad therapy, repeat EGD in March with erythematous duodenopathy, mild carditis, chronic gastritis with H. pylori s/p treatment with levofloxacin salvage therapy. Patient presented to the  emergency room 7/24 from his skilled nursing facility after he had an episode of vomiting that appeared to be coffee grounds.  While in the ED, he also had vomiting that was dark brown in color with some red streaking that was heme positive. No recurrent vomiting since being in the ED. Feeling well this morning. Denies any associated abdominal pain, reflux symptoms, dysphagia. Patient reports he thinks he ate something that didn't agree with him. There has been no report  of melena or brbpr. Notably, his hemoglobin was 14.6 on admission, down to 11.9 this morning, likely influenced by hemodilution as all cell counts have decreased.  BUN within normal limits.  Per outpatient med list, he is on PPI twice daily.  Differentials for coffee ground emesis include gastritis, esophagitis, Mallory-Weiss tear, PUD.  We will plan for EGD for further evaluation.  Regarding his history of H. pylori, he needs documentation of eradication.  He will need gastric biopsies at the time of EGD.   Plan: NPO Proceed with EGD with propofol with Dr. Levon Hedger today. Needs gastric biopsies to confirm H pylori eradication. Discussed with patient as well as his sister Warren Sullivan at bedside and sister Warren Sullivan by phone who are both agreeable to proceed.  Continue PPI infusion for now.  Further recommendations to follow.    LOS: 0 days    01/30/2022, 7:20 AM   Ermalinda Memos, [facility] Gastroenterology

## 2022-01-30 NOTE — Brief Op Note (Signed)
01/29/2022 - 01/30/2022  11:25 AM  PATIENT:  Warren Sullivan  61 y.o. male  PRE-OPERATIVE DIAGNOSIS:  coffee ground emesis  POST-OPERATIVE DIAGNOSIS:  hiatal hernia, esophageal nodule  PROCEDURE:  Procedure(s): ESOPHAGOGASTRODUODENOSCOPY (EGD) WITH PROPOFOL (N/A) BIOPSY  SURGEON:  Surgeon(s) and Role:    * Dolores Frame, MD - Primary  Patient underwent EGD under propofol sedation.  Tolerated the procedure adequately.  A 4 cm hiatal hernia was present. A single 2 mm mucosal nodule was found in the lower third of the esophagus, 37 cm from the incisors.  Biopsies were taken with a cold forceps for histology. The entire examined stomach was normal.  Biopsies were taken with a cold forceps for Helicobacter pylori testing. The examined duodenum was normal.   RECOMMENDATIONS - Return patient to hospital ward for ongoing care, but can be discharged to NH from [facility].  - Resume previous diet.  - Await pathology results.  - Continue pantoprazole 40 mg twice a day PO  Katrinka Blazing, MD Gastroenterology and Hepatology [facility]

## 2022-01-30 NOTE — Transfer of Care (Signed)
Immediate Anesthesia Transfer of Care Note  Patient: Warren Sullivan  Procedure(s) Performed: ESOPHAGOGASTRODUODENOSCOPY (EGD) WITH PROPOFOL BIOPSY  Patient Location: PACU  Anesthesia Type:General  Level of Consciousness: awake, alert  and oriented  Airway & Oxygen Therapy: Patient Spontanous Breathing  Post-op Assessment: Report given to RN, Post -op Vital signs reviewed and stable, Patient moving all extremities X 4 and Patient able to stick tongue midline  Post vital signs: Reviewed  Last Vitals:  Vitals Value Taken Time  BP 80/53 01/30/22 1120  Temp 97.9   Pulse 63 01/30/22 1122  Resp 15 01/30/22 1122  SpO2 90 % 01/30/22 1122  Vitals shown include unvalidated device data.  Last Pain:  Vitals:   01/30/22 1102  TempSrc:   PainSc: 0-No pain         Complications: No notable events documented.

## 2022-02-01 LAB — SURGICAL PATHOLOGY

## 2022-02-01 NOTE — Anesthesia Postprocedure Evaluation (Signed)
Anesthesia Post Note  Patient: HERMES WAFER  Procedure(s) Performed: ESOPHAGOGASTRODUODENOSCOPY (EGD) WITH PROPOFOL BIOPSY  Patient location during evaluation: Phase II Anesthesia Type: General Level of consciousness: awake Pain management: pain level controlled Vital Signs Assessment: post-procedure vital signs reviewed and stable Respiratory status: spontaneous breathing and respiratory function stable Cardiovascular status: blood pressure returned to baseline and stable Postop Assessment: no headache and no apparent nausea or vomiting Anesthetic complications: no Comments: Late entry   No notable events documented.   Last Vitals:  Vitals:   01/30/22 1219 01/30/22 1353  BP: (!) 144/79 131/73  Pulse: (!) 51 (!) 57  Resp: 18 19  Temp: (!) 36.4 C 36.9 C  SpO2: 98% 94%    Last Pain:  Vitals:   01/30/22 1353  TempSrc: Axillary  PainSc:                  Windell Norfolk

## 2022-02-05 ENCOUNTER — Encounter (HOSPITAL_COMMUNITY): Payer: Self-pay | Admitting: Gastroenterology

## 2022-06-07 ENCOUNTER — Emergency Department (HOSPITAL_COMMUNITY)
Admission: EM | Admit: 2022-06-07 | Discharge: 2022-06-07 | Disposition: A | Discharge status: Skilled Nursing Facility | Payer: Medicare Other | Source: Home / Self Care | Attending: Emergency Medicine | Admitting: Emergency Medicine

## 2022-06-07 ENCOUNTER — Encounter (HOSPITAL_COMMUNITY): Payer: Self-pay | Admitting: Emergency Medicine

## 2022-06-07 ENCOUNTER — Other Ambulatory Visit: Payer: Self-pay

## 2022-06-07 ENCOUNTER — Emergency Department (HOSPITAL_COMMUNITY): Payer: Medicare Other

## 2022-06-07 DIAGNOSIS — R112 Nausea with vomiting, unspecified: Secondary | ICD-10-CM | POA: Insufficient documentation

## 2022-06-07 DIAGNOSIS — Z7982 Long term (current) use of aspirin: Secondary | ICD-10-CM | POA: Insufficient documentation

## 2022-06-07 DIAGNOSIS — K2211 Ulcer of esophagus with bleeding: Secondary | ICD-10-CM | POA: Diagnosis not present

## 2022-06-07 LAB — COMPREHENSIVE METABOLIC PANEL
ALT: 16 U/L (ref 0–44)
AST: 18 U/L (ref 15–41)
Albumin: 4.4 g/dL (ref 3.5–5.0)
Alkaline Phosphatase: 82 U/L (ref 38–126)
Anion gap: 12 (ref 5–15)
BUN: 26 mg/dL — ABNORMAL HIGH (ref 8–23)
CO2: 30 mmol/L (ref 22–32)
Calcium: 9.3 mg/dL (ref 8.9–10.3)
Chloride: 99 mmol/L (ref 98–111)
Creatinine, Ser: 1.11 mg/dL (ref 0.61–1.24)
GFR, Estimated: >60 mL/min (ref >60)
Glucose, Bld: 132 mg/dL — ABNORMAL HIGH (ref 70–99)
Potassium: 3.9 mmol/L (ref 3.5–5.1)
Sodium: 141 mmol/L (ref 135–145)
Total Bilirubin: 0.5 mg/dL (ref 0.3–1.2)
Total Protein: 9.1 g/dL — ABNORMAL HIGH (ref 6.5–8.1)

## 2022-06-07 LAB — CBC WITH DIFFERENTIAL/PLATELET
Abs Immature Granulocytes: 0.03 10*3/uL (ref 0.00–0.07)
Basophils Absolute: 0.1 10*3/uL (ref 0.0–0.1)
Basophils Relative: 1 %
Eosinophils Absolute: 0.1 10*3/uL (ref 0.0–0.5)
Eosinophils Relative: 1 %
HCT: 46.6 % (ref 39.0–52.0)
Hemoglobin: 15.7 g/dL (ref 13.0–17.0)
Immature Granulocytes: 0 %
Lymphocytes Relative: 18 %
Lymphs Abs: 1.8 10*3/uL (ref 0.7–4.0)
MCH: 29.7 pg (ref 26.0–34.0)
MCHC: 33.7 g/dL (ref 30.0–36.0)
MCV: 88.3 fL (ref 80.0–100.0)
Monocytes Absolute: 0.7 10*3/uL (ref 0.1–1.0)
Monocytes Relative: 7 %
Neutro Abs: 7.3 10*3/uL (ref 1.7–7.7)
Neutrophils Relative %: 73 %
Platelets: 188 10*3/uL (ref 150–400)
RBC: 5.28 MIL/uL (ref 4.22–5.81)
RDW: 12.8 % (ref 11.5–15.5)
WBC: 10 10*3/uL (ref 4.0–10.5)
nRBC: 0 % (ref 0.0–0.2)

## 2022-06-07 LAB — LIPASE, BLOOD: Lipase: 30 U/L (ref 11–51)

## 2022-06-07 MED ORDER — SODIUM CHLORIDE 0.9 % IV BOLUS
500.0000 mL | Freq: Once | INTRAVENOUS | Status: AC
  Administered 2022-06-07: 500 mL via INTRAVENOUS

## 2022-06-07 MED ORDER — ONDANSETRON HCL 4 MG/2ML IJ SOLN
4.0000 mg | Freq: Once | INTRAMUSCULAR | Status: AC
  Administered 2022-06-07: 4 mg via INTRAVENOUS
  Filled 2022-06-07: qty 2

## 2022-06-07 NOTE — ED Provider Notes (Signed)
[facility] EMERGENCY DEPARTMENT Provider Note   CSN: [id number] Arrival date & time: 06/07/22  0058     History  Chief Complaint  Patient presents with   Emesis    Warren Sullivan is a 61 y.o. male.  Patient presents to the emergency department for evaluation of nausea and vomiting.  Patient reports that he had nausea and vomiting yesterday, feels better today.       Home Medications Prior to Admission medications   Medication Sig Start Date End Date Taking? Authorizing Provider  acetaminophen (TYLENOL) 650 MG CR tablet Take 650 mg by mouth every 8 (eight) hours as needed for pain.    [provider]  albuterol (PROVENTIL) (2.5 MG/3ML) 0.083% nebulizer solution Take 3 mLs (2.5 mg total) by nebulization every 4 (four) hours as needed for wheezing or shortness of breath. 01/30/22   Standley Dakins L, MD  aspirin EC 81 MG tablet Take 1 tablet (81 mg total) by mouth daily with breakfast. Swallow whole. 08/20/21   Shon Hale, MD  atorvastatin (LIPITOR) 20 MG tablet Take 1 tablet (20 mg total) by mouth daily. 08/20/21   Shon Hale, MD  Calcium Carbonate (CALCIUM 500 PO) Take 1 tablet by mouth daily.    [provider]  Cholecalciferol 25 MCG (1000 UT) capsule Take 5,000 Units by mouth daily.    [provider]  cloNIDine (CATAPRES) 0.1 MG tablet Take 2 tablets (0.2 mg total) by mouth 2 (two) times daily. 08/20/21   Shon Hale, MD  gabapentin (NEURONTIN) 300 MG capsule Take 1 capsule (300 mg total) by mouth 3 (three) times daily. Start with one at bedtime for 1 week. Then two at bedtime for one week, then go to full dose. 10/19/15   Mechele Claude, MD  losartan (COZAAR) 25 MG tablet Take 1 tablet (25 mg total) by mouth daily. 02/02/22   Johnson, Clanford L, MD  memantine (NAMENDA) 10 MG tablet Take 10 mg by mouth 2 (two) times daily. 07/24/21   [provider]  Multiple Vitamin (MULTIVITAMIN) tablet Take 1 tablet by mouth daily.     [provider]  pantoprazole (PROTONIX) 40 MG tablet Take 1 tablet (40 mg total) by mouth 2 (two) times daily. 08/20/21 08/20/22  Shon Hale, MD  tamsulosin (FLOMAX) 0.4 MG CAPS capsule Take 0.4 mg by mouth daily. 07/11/21   [provider]  VASCEPA 1 g capsule Take 2 g by mouth 2 (two) times daily. 08/08/21   [provider]  venlafaxine (EFFEXOR) 37.5 MG tablet Take 37.5 mg by mouth daily at 6 (six) AM.    [provider]  vitamin B-12 (CYANOCOBALAMIN) 500 MCG tablet Take 500 mcg by mouth daily.    [provider]      Allergies    Patient has no known allergies.    Review of Systems   Review of Systems  Physical Exam Updated Vital Signs BP (!) 156/81   Pulse 71   Temp 98.4 F (36.9 C)   Resp 13   Ht 5\' 11"  (1.803 m)   Wt 96.6 kg   SpO2 92%   BMI 29.70 kg/m  Physical Exam Vitals and nursing note reviewed.  Constitutional:      General: He is not in acute distress.    Appearance: He is well-developed.  HENT:     Head: Normocephalic and atraumatic.     Mouth/Throat:     Mouth: Mucous membranes are moist.  Eyes:     General: Vision  grossly intact. Gaze aligned appropriately.     Extraocular Movements: Extraocular movements intact.     Conjunctiva/sclera: Conjunctivae normal.  Cardiovascular:     Rate and Rhythm: Normal rate and regular rhythm.     Pulses: Normal pulses.     Heart sounds: Normal heart sounds, S1 normal and S2 normal. No murmur heard.    No friction rub. No gallop.  Pulmonary:     Effort: Pulmonary effort is normal. No respiratory distress.     Breath sounds: Normal breath sounds.  Abdominal:     Palpations: Abdomen is soft.     Tenderness: There is no abdominal tenderness. There is no guarding or rebound.     Hernia: No hernia is present.  Musculoskeletal:        General: No swelling.     Cervical back: Full passive range of motion without pain, normal range of motion and neck supple. No pain with  movement, spinous process tenderness or muscular tenderness. Normal range of motion.     Right lower leg: No edema.     Left lower leg: No edema.  Skin:    General: Skin is warm and dry.     Capillary Refill: Capillary refill takes less than 2 seconds.     Findings: No ecchymosis, erythema, lesion or wound.  Neurological:     Mental Status: He is alert and oriented to person, place, and time.     GCS: GCS eye subscore is 4. GCS verbal subscore is 5. GCS motor subscore is 6.     Cranial Nerves: Cranial nerves 2-12 are intact.     Sensory: Sensation is intact.     Motor: Motor function is intact. No weakness or abnormal muscle tone.     Coordination: Coordination is intact.  Psychiatric:        Mood and Affect: Mood normal.        Speech: Speech normal.        Behavior: Behavior normal.     ED Results / Procedures / Treatments   Labs (all labs ordered are listed, but only abnormal results are displayed) Labs Reviewed  COMPREHENSIVE METABOLIC PANEL - Abnormal; Notable for the following components:      Result Value   Glucose, Bld 132 (*)    BUN 26 (*)    Total Protein 9.1 (*)    All other components within normal limits  CBC WITH DIFFERENTIAL/PLATELET  LIPASE, BLOOD    EKG EKG Interpretation  Date/Time:  Thursday June 07 2022 01:21:57 EST Ventricular Rate:  98 PR Interval:  155 QRS Duration: 65 QT Interval:  412 QTC Calculation: 527 R Axis:   59 Text Interpretation: Sinus rhythm Borderline repolarization abnormality Prolonged QT interval No significant change since last tracing Confirmed by Gilda Crease [phone]) on 06/07/2022 1:27:32 AM  Radiology DG Chest Port 1 View  Result Date: 06/07/2022 CLINICAL DATA:  Cough EXAM: PORTABLE CHEST 1 VIEW COMPARISON:  01/29/2022 FINDINGS: Cardiac shadow is enlarged but stable. Aortic calcifications are seen. The lungs are well aerated bilaterally. No focal infiltrate or effusion is seen. No bony abnormality is noted.  IMPRESSION: No acute abnormality noted. Electronically Signed   By: Alcide Clever M.D.   On: 06/07/2022 03:49    Procedures Procedures    Medications Ordered in ED Medications  sodium chloride 0.9 % bolus 500 mL (500 mLs Intravenous New Bag/Given 06/07/22 0314)  ondansetron (ZOFRAN) injection 4 mg (4 mg Intravenous Given 06/07/22 6045)    ED Course/ Medical  Decision Making/ A&P                           Medical Decision Making Amount and/or Complexity of Data Reviewed Independent Historian: EMS External Data Reviewed: labs, ECG and notes. Labs: ordered. Decision-making details documented in ED Course. Radiology: ordered and independent interpretation performed. Decision-making details documented in ED Course. ECG/medicine tests: ordered and independent interpretation performed. Decision-making details documented in ED Course.  Risk Prescription drug management.   Presents to the emergency department for evaluation of nausea and vomiting.  Patient reports that symptoms were more prevalent yesterday, he felt well at time of arrival to the ED.  That was likely secondary to him having had Zofran.  While here in the department he started to have recurrent nausea.  He has not had any vomiting since he has been in the department.  Abdominal exam is benign, nontender.  His lab work is normal.  Examination and work-up not consistent with an acute surgical process.  Patient not have being nausea here but he was noted to have some cough with sputum production.  Chest x-ray was therefore performed and there is no evidence of pneumonia.  Review of his records reveals that he does have a history of gastritis and duodenitis that was H. pylori positive earlier this year.  This may be recurrence.  We will treat aggressively for gastritis, have him follow-up with his GI doctor for further management.        Final Clinical Impression(s) / ED Diagnoses Final diagnoses:  Nausea and vomiting,  unspecified vomiting type    Rx / DC Orders ED Discharge Orders     None         Quentin Strebel, Canary Brim, MD 06/07/22 0410

## 2022-06-07 NOTE — ED Triage Notes (Signed)
Pt BIB [facility] from [facility] for Nausea and vomiting. Pt states he was sick on his stomach yesterday but he feel fine now. Pt states "its the food they are feeding me that's making me sick."  Pt given PO Zofran at 1900 last night.

## 2022-06-08 ENCOUNTER — Emergency Department (HOSPITAL_COMMUNITY): Payer: Medicare Other

## 2022-06-08 ENCOUNTER — Other Ambulatory Visit: Payer: Self-pay

## 2022-06-08 ENCOUNTER — Inpatient Hospital Stay (HOSPITAL_COMMUNITY)
Admission: EM | Admit: 2022-06-08 | Discharge: 2022-06-11 | DRG: 381 | Disposition: A | Discharge status: Skilled Nursing Facility | Payer: Medicare Other | Source: Skilled Nursing Facility | Attending: Internal Medicine | Admitting: Internal Medicine

## 2022-06-08 ENCOUNTER — Encounter (HOSPITAL_COMMUNITY): Payer: Self-pay

## 2022-06-08 DIAGNOSIS — R1111 Vomiting without nausea: Secondary | ICD-10-CM | POA: Diagnosis not present

## 2022-06-08 DIAGNOSIS — I119 Hypertensive heart disease without heart failure: Secondary | ICD-10-CM | POA: Diagnosis present

## 2022-06-08 DIAGNOSIS — R1110 Vomiting, unspecified: Secondary | ICD-10-CM | POA: Diagnosis present

## 2022-06-08 DIAGNOSIS — F32A Depression, unspecified: Secondary | ICD-10-CM | POA: Diagnosis present

## 2022-06-08 DIAGNOSIS — F02C3 Dementia in other diseases classified elsewhere, severe, with mood disturbance: Secondary | ICD-10-CM | POA: Diagnosis present

## 2022-06-08 DIAGNOSIS — D696 Thrombocytopenia, unspecified: Secondary | ICD-10-CM | POA: Diagnosis present

## 2022-06-08 DIAGNOSIS — E785 Hyperlipidemia, unspecified: Secondary | ICD-10-CM | POA: Diagnosis present

## 2022-06-08 DIAGNOSIS — Z8249 Family history of ischemic heart disease and other diseases of the circulatory system: Secondary | ICD-10-CM

## 2022-06-08 DIAGNOSIS — K922 Gastrointestinal hemorrhage, unspecified: Secondary | ICD-10-CM | POA: Diagnosis present

## 2022-06-08 DIAGNOSIS — K219 Gastro-esophageal reflux disease without esophagitis: Secondary | ICD-10-CM | POA: Diagnosis present

## 2022-06-08 DIAGNOSIS — Z8673 Personal history of transient ischemic attack (TIA), and cerebral infarction without residual deficits: Secondary | ICD-10-CM

## 2022-06-08 DIAGNOSIS — K209 Esophagitis, unspecified without bleeding: Secondary | ICD-10-CM

## 2022-06-08 DIAGNOSIS — I69351 Hemiplegia and hemiparesis following cerebral infarction affecting right dominant side: Secondary | ICD-10-CM

## 2022-06-08 DIAGNOSIS — K2970 Gastritis, unspecified, without bleeding: Secondary | ICD-10-CM | POA: Diagnosis present

## 2022-06-08 DIAGNOSIS — K449 Diaphragmatic hernia without obstruction or gangrene: Secondary | ICD-10-CM

## 2022-06-08 DIAGNOSIS — F17210 Nicotine dependence, cigarettes, uncomplicated: Secondary | ICD-10-CM | POA: Diagnosis present

## 2022-06-08 DIAGNOSIS — G8191 Hemiplegia, unspecified affecting right dominant side: Secondary | ICD-10-CM

## 2022-06-08 DIAGNOSIS — E876 Hypokalemia: Secondary | ICD-10-CM | POA: Diagnosis not present

## 2022-06-08 DIAGNOSIS — I639 Cerebral infarction, unspecified: Secondary | ICD-10-CM

## 2022-06-08 DIAGNOSIS — R9389 Abnormal findings on diagnostic imaging of other specified body structures: Secondary | ICD-10-CM

## 2022-06-08 DIAGNOSIS — K2211 Ulcer of esophagus with bleeding: Principal | ICD-10-CM | POA: Diagnosis present

## 2022-06-08 DIAGNOSIS — N400 Enlarged prostate without lower urinary tract symptoms: Secondary | ICD-10-CM | POA: Diagnosis present

## 2022-06-08 DIAGNOSIS — F0390 Unspecified dementia without behavioral disturbance: Secondary | ICD-10-CM

## 2022-06-08 DIAGNOSIS — I10 Essential (primary) hypertension: Secondary | ICD-10-CM | POA: Diagnosis present

## 2022-06-08 DIAGNOSIS — Z7982 Long term (current) use of aspirin: Secondary | ICD-10-CM

## 2022-06-08 DIAGNOSIS — K2100 Gastro-esophageal reflux disease with esophagitis, without bleeding: Secondary | ICD-10-CM | POA: Diagnosis present

## 2022-06-08 DIAGNOSIS — Z79899 Other long term (current) drug therapy: Secondary | ICD-10-CM

## 2022-06-08 DIAGNOSIS — Z823 Family history of stroke: Secondary | ICD-10-CM

## 2022-06-08 DIAGNOSIS — D72829 Elevated white blood cell count, unspecified: Secondary | ICD-10-CM | POA: Diagnosis present

## 2022-06-08 DIAGNOSIS — N179 Acute kidney failure, unspecified: Secondary | ICD-10-CM | POA: Diagnosis not present

## 2022-06-08 LAB — URINALYSIS, ROUTINE W REFLEX MICROSCOPIC
Bilirubin Urine: NEGATIVE
Glucose, UA: NEGATIVE mg/dL
Ketones, ur: 5 mg/dL — AB
Leukocytes,Ua: NEGATIVE
Nitrite: NEGATIVE
Protein, ur: 100 mg/dL — AB
Specific Gravity, Urine: 1.026 (ref 1.005–1.030)
pH: 6 (ref 5.0–8.0)

## 2022-06-08 LAB — COMPREHENSIVE METABOLIC PANEL
ALT: 15 U/L (ref 0–44)
AST: 17 U/L (ref 15–41)
Albumin: 4.4 g/dL (ref 3.5–5.0)
Alkaline Phosphatase: 76 U/L (ref 38–126)
Anion gap: 16 — ABNORMAL HIGH (ref 5–15)
BUN: 41 mg/dL — ABNORMAL HIGH (ref 8–23)
CO2: 29 mmol/L (ref 22–32)
Calcium: 9.7 mg/dL (ref 8.9–10.3)
Chloride: 100 mmol/L (ref 98–111)
Creatinine, Ser: 1.21 mg/dL (ref 0.61–1.24)
GFR, Estimated: >60 mL/min (ref >60)
Glucose, Bld: 159 mg/dL — ABNORMAL HIGH (ref 70–99)
Potassium: 3.6 mmol/L (ref 3.5–5.1)
Sodium: 145 mmol/L (ref 135–145)
Total Bilirubin: 0.6 mg/dL (ref 0.3–1.2)
Total Protein: 9 g/dL — ABNORMAL HIGH (ref 6.5–8.1)

## 2022-06-08 LAB — CBC
HCT: 47.6 % (ref 39.0–52.0)
Hemoglobin: 16.3 g/dL (ref 13.0–17.0)
MCH: 30.1 pg (ref 26.0–34.0)
MCHC: 34.2 g/dL (ref 30.0–36.0)
MCV: 88 fL (ref 80.0–100.0)
Platelets: 199 10*3/uL (ref 150–400)
RBC: 5.41 MIL/uL (ref 4.22–5.81)
RDW: 12.9 % (ref 11.5–15.5)
WBC: 16.3 10*3/uL — ABNORMAL HIGH (ref 4.0–10.5)
nRBC: 0 % (ref 0.0–0.2)

## 2022-06-08 LAB — TYPE AND SCREEN
ABO/RH(D): O POS
Antibody Screen: NEGATIVE

## 2022-06-08 MED ORDER — LOSARTAN POTASSIUM 50 MG PO TABS
50.0000 mg | ORAL_TABLET | Freq: Every day | ORAL | Status: DC
  Administered 2022-06-09: 50 mg via ORAL
  Filled 2022-06-08 (×2): qty 1

## 2022-06-08 MED ORDER — HYDRALAZINE HCL 20 MG/ML IJ SOLN: 10.0000 mg | Freq: Four times a day (QID) | INTRAMUSCULAR | Status: DC | PRN

## 2022-06-08 MED ORDER — GABAPENTIN 300 MG PO CAPS
300.0000 mg | ORAL_CAPSULE | Freq: Three times a day (TID) | ORAL | Status: DC
  Administered 2022-06-08 – 2022-06-11 (×8): 300 mg via ORAL
  Filled 2022-06-08 (×8): qty 1

## 2022-06-08 MED ORDER — LACTATED RINGERS IV SOLN: INTRAVENOUS | Status: DC

## 2022-06-08 MED ORDER — ATORVASTATIN CALCIUM 10 MG PO TABS
20.0000 mg | ORAL_TABLET | Freq: Every day | ORAL | Status: DC
  Administered 2022-06-08 – 2022-06-11 (×3): 20 mg via ORAL
  Filled 2022-06-08 (×3): qty 2

## 2022-06-08 MED ORDER — SODIUM CHLORIDE 0.9 % IV BOLUS
1000.0000 mL | Freq: Once | INTRAVENOUS | Status: AC
  Administered 2022-06-08: 1000 mL via INTRAVENOUS

## 2022-06-08 MED ORDER — PANTOPRAZOLE SODIUM 40 MG IV SOLR
40.0000 mg | Freq: Two times a day (BID) | INTRAVENOUS | Status: DC
  Administered 2022-06-08 – 2022-06-11 (×6): 40 mg via INTRAVENOUS
  Filled 2022-06-08 (×6): qty 10

## 2022-06-08 MED ORDER — ONDANSETRON HCL 4 MG/2ML IJ SOLN
4.0000 mg | Freq: Once | INTRAMUSCULAR | Status: AC
  Administered 2022-06-08: 4 mg via INTRAVENOUS
  Filled 2022-06-08: qty 2

## 2022-06-08 MED ORDER — MEMANTINE HCL 10 MG PO TABS
10.0000 mg | ORAL_TABLET | Freq: Two times a day (BID) | ORAL | Status: DC
  Administered 2022-06-08 – 2022-06-11 (×6): 10 mg via ORAL
  Filled 2022-06-08 (×6): qty 1

## 2022-06-08 MED ORDER — CLONIDINE HCL 0.1 MG PO TABS
0.2000 mg | ORAL_TABLET | Freq: Two times a day (BID) | ORAL | Status: DC
  Administered 2022-06-08 – 2022-06-09 (×3): 0.2 mg via ORAL
  Filled 2022-06-08 (×4): qty 2

## 2022-06-08 MED ORDER — PANTOPRAZOLE SODIUM 40 MG IV SOLR
40.0000 mg | Freq: Once | INTRAVENOUS | Status: AC
  Administered 2022-06-08: 40 mg via INTRAVENOUS
  Filled 2022-06-08: qty 10

## 2022-06-08 MED ORDER — VENLAFAXINE HCL ER 75 MG PO CP24
75.0000 mg | ORAL_CAPSULE | Freq: Every day | ORAL | Status: DC
  Administered 2022-06-09 – 2022-06-11 (×3): 75 mg via ORAL
  Filled 2022-06-08 (×3): qty 1

## 2022-06-08 MED ORDER — TAMSULOSIN HCL 0.4 MG PO CAPS
0.4000 mg | ORAL_CAPSULE | Freq: Every day | ORAL | Status: DC
  Administered 2022-06-09: 0.4 mg via ORAL
  Filled 2022-06-08 (×2): qty 1

## 2022-06-08 MED ORDER — IOHEXOL 300 MG/ML  SOLN
100.0000 mL | Freq: Once | INTRAMUSCULAR | Status: AC | PRN
  Administered 2022-06-08: 100 mL via INTRAVENOUS

## 2022-06-08 MED ORDER — ONDANSETRON HCL 4 MG/2ML IJ SOLN
4.0000 mg | Freq: Four times a day (QID) | INTRAMUSCULAR | Status: DC | PRN
  Administered 2022-06-08: 4 mg via INTRAVENOUS
  Filled 2022-06-08: qty 2

## 2022-06-08 MED ORDER — ICOSAPENT ETHYL 1 G PO CAPS
2.0000 g | ORAL_CAPSULE | Freq: Two times a day (BID) | ORAL | Status: DC
  Administered 2022-06-08: 1 g via ORAL
  Administered 2022-06-09 – 2022-06-11 (×5): 2 g via ORAL
  Filled 2022-06-08 (×11): qty 2

## 2022-06-08 NOTE — ED Triage Notes (Signed)
Pt brought to ED via [facility] from [facility] for vomiting dark blood this am.  Pt ASA was discontinued last night.

## 2022-06-08 NOTE — ED Notes (Signed)
Pt given sprite 

## 2022-06-08 NOTE — ED Notes (Signed)
EDP @ bedside 

## 2022-06-08 NOTE — Progress Notes (Signed)
Pt arrived to room 331 on 2L [place], a/ox3

## 2022-06-08 NOTE — ED Notes (Signed)
Pt vomited x1 on towel, small amount of pink tinged fluid noted. EDP made aware. EDP informed pt O2 sat 86% on RA, placed on 2L, pt sisters at bedside and states pt "talking out of his head and talking about people who are dead." Orders received.

## 2022-06-08 NOTE — ED Notes (Signed)
Pt vomited x 1, unable to tolerate sprite.

## 2022-06-08 NOTE — ED Notes (Signed)
Pt sisters at bedside and states he is "talking out of his head, talking about people who have already died."

## 2022-06-08 NOTE — ED Provider Notes (Signed)
[facility] EMERGENCY DEPARTMENT Provider Note   CSN: [id number] Arrival date & time: 06/08/22  0854     History  Chief Complaint  Patient presents with   Emesis    Warren Sullivan is a 61 y.o. male.  Pt sent here for vomiting.  Pt reported to have coffee ground emesis yesterday and earlier today.  Pt has a history of the same.  Pt was seen here yesterday and sent home.  Family reports pt continuing to vomit.  Pt's sister reports pt unable to eat or drink.  Pt has also had a sporadic cough   The history is provided by the patient and a relative. No language interpreter was used.  Emesis Severity:  Moderate Duration:  2 days Timing:  Constant Number of daily episodes:  Multiple Quality:  Unable to specify Progression:  Worsening Chronicity:  Recurrent Relieved by:  Nothing Worsened by:  Nothing Ineffective treatments:  None tried Risk factors: no alcohol use and no sick contacts        Home Medications Prior to Admission medications   Medication Sig Start Date End Date Taking? Authorizing Provider  acetaminophen (TYLENOL) 650 MG CR tablet Take 650 mg by mouth every 8 (eight) hours as needed for pain.    [provider]  albuterol (PROVENTIL) (2.5 MG/3ML) 0.083% nebulizer solution Take 3 mLs (2.5 mg total) by nebulization every 4 (four) hours as needed for wheezing or shortness of breath. 01/30/22   Irwin Brakeman L, MD  aspirin EC 81 MG tablet Take 1 tablet (81 mg total) by mouth daily with breakfast. Swallow whole. 08/20/21   Roxan Hockey, MD  atorvastatin (LIPITOR) 20 MG tablet Take 1 tablet (20 mg total) by mouth daily. 08/20/21   Roxan Hockey, MD  Calcium Carbonate (CALCIUM 500 PO) Take 1 tablet by mouth daily.    [provider]  Cholecalciferol 25 MCG (1000 UT) capsule Take 5,000 Units by mouth daily.    [provider]  cloNIDine (CATAPRES) 0.1 MG tablet Take 2 tablets (0.2 mg total) by mouth 2 (two) times daily. 08/20/21    Roxan Hockey, MD  gabapentin (NEURONTIN) 300 MG capsule Take 1 capsule (300 mg total) by mouth 3 (three) times daily. Start with one at bedtime for 1 week. Then two at bedtime for one week, then go to full dose. 10/19/15   Claretta Fraise, MD  losartan (COZAAR) 25 MG tablet Take 1 tablet (25 mg total) by mouth daily. 02/02/22   Johnson, Clanford L, MD  memantine (NAMENDA) 10 MG tablet Take 10 mg by mouth 2 (two) times daily. 07/24/21   [provider]  Multiple Vitamin (MULTIVITAMIN) tablet Take 1 tablet by mouth daily.    [provider]  pantoprazole (PROTONIX) 40 MG tablet Take 1 tablet (40 mg total) by mouth 2 (two) times daily. 08/20/21 08/20/22  Roxan Hockey, MD  tamsulosin (FLOMAX) 0.4 MG CAPS capsule Take 0.4 mg by mouth daily. 07/11/21   [provider]  VASCEPA 1 g capsule Take 2 g by mouth 2 (two) times daily. 08/08/21   [provider]  venlafaxine (EFFEXOR) 37.5 MG tablet Take 37.5 mg by mouth daily at 6 (six) AM.    [provider]  vitamin B-12 (CYANOCOBALAMIN) 500 MCG tablet Take 500 mcg by mouth daily.    [provider]      Allergies    Patient has no known allergies.    Review of Systems   Review of Systems  Gastrointestinal:  Positive for vomiting.  All other systems reviewed and are negative.   Physical Exam Updated Vital Signs BP (!) 162/100   Pulse 79   Temp 98.6 F (37 C) (Oral)   Resp 13   Ht 5\' 11"  (1.803 m)   Wt 96.6 kg   SpO2 97%   BMI 29.70 kg/m  Physical Exam Vitals and nursing note reviewed.  Constitutional:      Appearance: He is well-developed.  HENT:     Head: Normocephalic.  Cardiovascular:     Rate and Rhythm: Normal rate.  Pulmonary:     Effort: Pulmonary effort is normal.  Abdominal:     General: Abdomen is flat. There is no distension.     Palpations: Abdomen is soft.  Musculoskeletal:        General: Normal range of motion.     Cervical back: Normal range of motion.   Neurological:     General: No focal deficit present.     Mental Status: He is alert and oriented to person, place, and time.  Psychiatric:        Mood and Affect: Mood normal.     ED Results / Procedures / Treatments   Labs (all labs ordered are listed, but only abnormal results are displayed) Labs Reviewed  COMPREHENSIVE METABOLIC PANEL - Abnormal; Notable for the following components:      Result Value   Glucose, Bld 159 (*)    BUN 41 (*)    Total Protein 9.0 (*)    Anion gap 16 (*)    All other components within normal limits  CBC - Abnormal; Notable for the following components:   WBC 16.3 (*)    All other components within normal limits  PH, GASTRIC FLUID (GASTROCCULT CARD)  POC OCCULT BLOOD, ED  TYPE AND SCREEN    EKG None  Radiology DG Chest Port 1 View  Result Date: 06/08/2022 CLINICAL DATA:  Hematemesis EXAM: PORTABLE CHEST 1 VIEW COMPARISON:  Chest radiograph 1 day prior FINDINGS: The cardiomediastinal silhouette is stable, with unchanged cardiomegaly. The upper mediastinal contours are stable with unchanged prominence of the right paratracheal stripe. There is possible retrocardiac opacity suboptimally evaluated due to portable technique and overlying leads/wires. There may be a left pleural effusion. The right lung is clear. There is no right effusion. There is no pneumothorax There is no acute osseous abnormality. IMPRESSION: Possible retrocardiac opacity and left pleural effusion, suboptimally evaluated. Consider PA and lateral chest radiographs for better evaluation if indicated. Electronically Signed   By: Valetta Mole M.D.   On: 06/08/2022 13:53   DG Chest Port 1 View  Result Date: 06/07/2022 CLINICAL DATA:  Cough EXAM: PORTABLE CHEST 1 VIEW COMPARISON:  01/29/2022 FINDINGS: Cardiac shadow is enlarged but stable. Aortic calcifications are seen. The lungs are well aerated bilaterally. No focal infiltrate or effusion is seen. No bony abnormality is noted.  IMPRESSION: No acute abnormality noted. Electronically Signed   By: Inez Catalina M.D.   On: 06/07/2022 03:49    Procedures Procedures    Medications Ordered in ED Medications  sodium chloride 0.9 % bolus 1,000 mL (0 mLs Intravenous Stopped 06/08/22 1104)  pantoprazole (PROTONIX) injection 40 mg (40 mg Intravenous Given 06/08/22 0927)  ondansetron (ZOFRAN) injection 4 mg (4 mg Intravenous Given 06/08/22 1158)    ED Course/ Medical Decision Making/ A&P  Medical Decision Making Pt seen here yesterday.  Pt is reported to have vomited blood.  Pt has a history of the same in the past.  Pt had EGD in August.    Amount and/or Complexity of Data Reviewed Labs: ordered. Decision-making details documented in ED Course.    Details: Pt has a elevated wbc count of 16.3.  Hemoglobin is 16.3   Radiology: ordered and independent interpretation performed.    Details: Chest xray obtained   Risk Prescription drug management. Risk Details: Pt has had multiple episodes of vomiting.  Vomit is clear,  Pt given zofran IV.  Pt tolerated small sips of sprite but vomited afterwards.  Pt's family does not feel pt can return to facility.  I doubt upper gi bleeding.  I have not been able to gastrocult anything pt has vomitted.  Nurses report he vomited sprite and saliva.             Final Clinical Impression(s) / ED Diagnoses Final diagnoses:  Vomiting, unspecified vomiting type, unspecified whether nausea present  Esophagitis    Rx / DC Orders ED Discharge Orders     None         Osie Cheeks 06/08/22 Rowland Lathe, MD 06/09/22 [phone]

## 2022-06-08 NOTE — H&P (Signed)
History and Physical    Patient: Warren Sullivan:[id number] DOB: [birth year] DOA: 06/08/2022 DOS: the patient was seen and examined on 06/08/2022 PCP: Sherol Dade, DO  Patient coming from: SNF  Chief Complaint:  Chief Complaint  Patient presents with   Emesis   HPI: Warren Sullivan is a 61 y.o. male with medical history significant of history of CVA, dementia, GERD, hyperlipidemia, hypertension.  Patient is a resident of [facility].  He was brought to the hospital for evaluation due to reports of vomiting dark blood this morning.  He was evaluated for something similar early yesterday morning.  By reports, every time he eats or drinks something he vomits.  Here, he has been given clear liquids, which he vomited.  Emesis here remains stomach contents without blood.  He was given IV fluid bolus, Zofran for antiemetics, and a dose of Protonix IV.  No fevers, chills, abdominal pain, chest pain.  There were some reports from family members that he has been increasingly confused.  Here, he appears to answer questions appropriately in relation to his dementia.  Review of Systems: As mentioned in the history of present illness. All other systems reviewed and are negative. Past Medical History:  Diagnosis Date   CVA (cerebral vascular accident) (HCC)    Dementia in other diseases classified elsewhere, severe, without behavioral disturbance, psychotic disturbance, mood disturbance, and anxiety (HCC)    GERD (gastroesophageal reflux disease)    Hemiplegia, unspecified affecting right dominant side (HCC)    Hyperlipidemia    Hypertension    MDD (major depressive disorder)    Stroke (HCC)    Vitamin D deficiency    Past Surgical History:  Procedure Laterality Date   BIOPSY  08/18/2021   Procedure: BIOPSY;  Surgeon: Dolores Frame, MD;  [facility];  Service: Gastroenterology;;  Bonnielee Haff and distal esophagus biopsies    BIOPSY  10/03/2021    Procedure: BIOPSY;  Surgeon: Dolores Frame, MD;  [facility];  Service: Gastroenterology;;   BIOPSY  01/30/2022   Procedure: BIOPSY;  Surgeon: Dolores Frame, MD;  [facility];  Service: Gastroenterology;;   ESOPHAGOGASTRODUODENOSCOPY (EGD) WITH PROPOFOL N/A 08/18/2021   Surgeon: Marguerita Merles, Reuel Boom, MD; severe acute nonspecific esophagitis with ulceration and reactive hyperplasia, peptic duodenitis, acute chronic gastritis with H. pylori, 3 cm hiatal hernia.   ESOPHAGOGASTRODUODENOSCOPY (EGD) WITH PROPOFOL N/A 10/03/2021   Surgeon: Marguerita Merles, Reuel Boom, MD; myocarditis, chronic gastritis with H. pylori, 2 cm hiatal hernia, erythematous duodenopathy.   ESOPHAGOGASTRODUODENOSCOPY (EGD) WITH PROPOFOL N/A 01/30/2022   Procedure: ESOPHAGOGASTRODUODENOSCOPY (EGD) WITH PROPOFOL;  Surgeon: Dolores Frame, MD;  [facility];  Service: Gastroenterology;  Laterality: N/A;   HERNIA REPAIR     Social History:  reports that he has been smoking cigarettes. He has been smoking an average of .25 packs per day. He does not have any smokeless tobacco history on file. He reports that he does not drink alcohol and does not use drugs.  No Known Allergies  Family History  Problem Relation Age of Onset   Hypertension Mother    Stroke Mother    Hypertension Father    Hypertension Sister     Prior to Admission medications   Medication Sig Start Date End Date Taking? Authorizing Provider  acetaminophen (TYLENOL) 650 MG CR tablet Take 1,300 mg by mouth every 8 (eight) hours as needed (general discomfort).   Yes [provider]  albuterol (PROVENTIL) (2.5 MG/3ML) 0.083% nebulizer  solution Take 3 mLs (2.5 mg total) by nebulization every 4 (four) hours as needed for wheezing or shortness of breath. 01/30/22  Yes Johnson, Clanford L, MD  atorvastatin (LIPITOR) 20 MG tablet Take 1 tablet (20 mg total) by mouth daily. Patient taking  differently: Take 20 mg by mouth at bedtime. 08/20/21  Yes Emokpae, Courage, MD  Calcium Carbonate (CALCIUM 500 PO) Take 1 tablet by mouth daily.   Yes [provider]  Cholecalciferol 25 MCG (1000 UT) capsule Take 5,000 Units by mouth daily.   Yes [provider]  cloNIDine (CATAPRES) 0.1 MG tablet Take 2 tablets (0.2 mg total) by mouth 2 (two) times daily. 08/20/21  Yes Emokpae, Courage, MD  gabapentin (NEURONTIN) 300 MG capsule Take 1 capsule (300 mg total) by mouth 3 (three) times daily. Start with one at bedtime for 1 week. Then two at bedtime for one week, then go to full dose. 10/19/15  Yes Stacks, Broadus John, MD  losartan (COZAAR) 50 MG tablet Take 50 mg by mouth daily. 04/20/22  Yes [provider]  memantine (NAMENDA) 10 MG tablet Take 10 mg by mouth 2 (two) times daily. 07/24/21  Yes [provider]  Multiple Vitamin (MULTIVITAMIN) tablet Take 1 tablet by mouth daily.   Yes [provider]  ondansetron (ZOFRAN) 4 MG tablet Take 4 mg by mouth every 8 (eight) hours as needed for nausea or vomiting. 02/26/22  Yes [provider]  pantoprazole (PROTONIX) 40 MG tablet Take 1 tablet (40 mg total) by mouth 2 (two) times daily. 08/20/21 08/20/22 Yes Emokpae, Courage, MD  tamsulosin (FLOMAX) 0.4 MG CAPS capsule Take 0.4 mg by mouth daily. 07/11/21  Yes [provider]  VASCEPA 1 g capsule Take 2 g by mouth 2 (two) times daily. 08/08/21  Yes [provider]  Venlafaxine HCl 75 MG TB24 Take 75 mg by mouth daily.   Yes [provider]  vitamin B-12 (CYANOCOBALAMIN) 500 MCG tablet Take 500 mcg by mouth daily.   Yes [provider]  aspirin EC 81 MG tablet Take 1 tablet (81 mg total) by mouth daily with breakfast. Swallow whole. 08/20/21   Shon Hale, MD  losartan (COZAAR) 25 MG tablet Take 1 tablet (25 mg total) by mouth daily. Patient not taking: Reported on 06/08/2022 02/02/22   Cleora Fleet, MD    Physical  Exam: Vitals:   06/08/22 1633 06/08/22 1730 06/08/22 1748 06/08/22 1930  BP:  (!) 144/103  (!) 163/93  Pulse: 97 90    Resp: Temp:   98.8 F (37.1 C)   TempSrc:   Oral   SpO2: 99% 97%    Weight:      Height:       General: Elderly male. Awake and alert and oriented x1. No acute cardiopulmonary distress.  HEENT: Normocephalic atraumatic.  Right and left ears normal in appearance.  Pupils equal, round, reactive to light. Extraocular muscles are intact. Sclerae anicteric and noninjected.  Moist mucosal membranes. No mucosal lesions.  Neck: Neck supple without lymphadenopathy. No carotid bruits. No masses palpated.  Cardiovascular: Regular rate with normal S1-S2 sounds. No murmurs, rubs, gallops auscultated. No JVD.  Respiratory: Good respiratory effort with no wheezes, rales, rhonchi. Lungs clear to auscultation bilaterally.  No accessory muscle use. Abdomen: Soft, nontender, nondistended. Active bowel sounds. No masses or hepatosplenomegaly  Skin: No rashes, lesions, or ulcerations.  Dry, warm to touch. 2+ dorsalis pedis and radial pulses. Musculoskeletal: No calf or  leg pain. All major joints not erythematous nontender.  No upper or lower joint deformation.  Good ROM.  No contractures  Psychiatric: Lacks judgment and insight Neurologic: No focal neurological deficits. Strength is 5/5 and symmetric in upper and lower extremities.  Cranial nerves II through XII are grossly intact.  Data Reviewed: Results for orders placed or performed during the hospital encounter of 06/08/22 (from the past 24 hour(s))  Comprehensive metabolic panel     Status: Abnormal   Collection Time: 06/08/22  9:10 AM  Result Value Ref Range   Sodium 145 135 - 145 mmol/L   Potassium 3.6 3.5 - 5.1 mmol/L   Chloride 100 98 - 111 mmol/L   CO2 29 22 - 32 mmol/L   Glucose, Bld 159 (H) 70 - 99 mg/dL   BUN 41 (H) 8 - 23 mg/dL   Creatinine, Ser 7.561.21 0.61 - 1.24 mg/dL   Calcium 9.7 8.9 - 43.310.3 mg/dL   Total  Protein 9.0 (H) 6.5 - 8.1 g/dL   Albumin 4.4 3.5 - 5.0 g/dL   AST 17 15 - 41 U/L   ALT 15 0 - 44 U/L   Alkaline Phosphatase 76 38 - 126 U/L   Total Bilirubin 0.6 0.3 - 1.2 mg/dL   GFR, Estimated >29>60 >51>60 mL/min   Anion gap 16 (H) 5 - 15  CBC     Status: Abnormal   Collection Time: 06/08/22  9:10 AM  Result Value Ref Range   WBC 16.3 (H) 4.0 - 10.5 K/uL   RBC 5.41 4.22 - 5.81 MIL/uL   Hemoglobin 16.3 13.0 - 17.0 g/dL   HCT 88.447.6 16.639.0 - 06.352.0 %   MCV 88.0 80.0 - 100.0 fL   MCH 30.1 26.0 - 34.0 pg   MCHC 34.2 30.0 - 36.0 g/dL   RDW 01.612.9 01.011.5 - 93.215.5 %   Platelets 199 150 - 400 K/uL   nRBC 0.0 0.0 - 0.2 %  Type and screen [facility]     Status: None   Collection Time: 06/08/22  9:10 AM  Result Value Ref Range   ABO/RH(D) O POS    Antibody Screen NEG    Sample Expiration      06/11/2022,2359 Performed at [facility], [address] [place], [place] [address]   Urinalysis, Routine w reflex microscopic Urine, In & Out Cath     Status: Abnormal   Collection Time: 06/08/22  5:42 PM  Result Value Ref Range   Color, Urine YELLOW YELLOW   APPearance CLEAR CLEAR   Specific Gravity, Urine 1.026 1.005 - 1.030   pH 6.0 5.0 - 8.0   Glucose, UA NEGATIVE NEGATIVE mg/dL   Hgb urine dipstick SMALL (A) NEGATIVE   Bilirubin Urine NEGATIVE NEGATIVE   Ketones, ur 5 (A) NEGATIVE mg/dL   Protein, ur 220100 (A) NEGATIVE mg/dL   Nitrite NEGATIVE NEGATIVE   Leukocytes,Ua NEGATIVE NEGATIVE   RBC / HPF 11-20 0 - 5 RBC/hpf   WBC, UA 0-5 0 - 5 WBC/hpf   Bacteria, UA RARE (A) NONE SEEN   Squamous Epithelial / LPF 0-5 0 - 5   Mucus PRESENT    CT CHEST ABDOMEN PELVIS W CONTRAST  Result Date: 06/08/2022 CLINICAL DATA:  Vomiting dark red blood. History of esophageal mass. EXAM: CT CHEST, ABDOMEN, AND PELVIS WITH CONTRAST TECHNIQUE: Multidetector CT imaging of the chest, abdomen and pelvis was performed following the standard protocol during bolus administration of intravenous contrast. RADIATION DOSE  REDUCTION: This exam was performed  according to the departmental dose-optimization program which includes automated exposure control, adjustment of the mA and/or kV according to patient size and/or use of iterative reconstruction technique. CONTRAST:  OMNIPAQUE IOHEXOL 300 MG/ML  SOLN COMPARISON:  CT abdomen and pelvis 08/17/2021.  CT chest 08/19/2021. FINDINGS: CT CHEST FINDINGS Cardiovascular: No significant vascular findings. Normal heart size. No pericardial effusion. There are atherosclerotic calcifications of the aorta and coronary arteries. Mediastinum/Nodes: There are no enlarged mediastinal or hilar lymph nodes. There is a right thyroid nodule measuring 1 cm. There is a small hiatal hernia, unchanged. There is circumferential wall thickening of the entire esophagus most significant in the mid and lower esophagus. There are small paraesophageal lymph nodes similar to prior. There is mild stranding surrounding the gastroesophageal junction without evidence for fluid collection or pneumomediastinum. Findings are similar to the prior study. Lungs/Pleura: There are scattered atelectatic changes in both lungs. There are left-sided pleural calcifications similar to the prior study. There is no new focal lung infiltrate or pleural effusion. There is no pneumothorax. Trachea and central airways are patent. Musculoskeletal: No chest wall mass or suspicious bone lesions identified. No acute fractures. There are healed left-sided ninth and tenth rib fractures. CT ABDOMEN PELVIS FINDINGS Hepatobiliary: No focal liver abnormality is seen. No gallstones, gallbladder wall thickening, or biliary dilatation. Pancreas: Unremarkable. No pancreatic ductal dilatation or surrounding inflammatory changes. Spleen: Normal in size without focal abnormality. Adrenals/Urinary Tract: Subcentimeter cyst in the left kidney is unchanged. There is no hydronephrosis or perinephric stranding. The adrenal glands and bladder are within  normal limits. Stomach/Bowel: Small hiatal hernia is present. Otherwise, the stomach is within normal limits. No bowel obstruction, pneumatosis or free air. Appendix within normal limits. There is colonic diverticulosis. There is large stool burden in the rectum. Vascular/Lymphatic: Aortic atherosclerosis. No enlarged abdominal or pelvic lymph nodes. Reproductive: Prostate is unremarkable. Other: No abdominal wall hernia or abnormality. No abdominopelvic ascites. Musculoskeletal: No acute or significant osseous findings. IMPRESSION: 1. Findings compatible with a diffuse esophagitis. 2. Stable findings stranding and small lymph nodes surrounding the gastroesophageal junction with small hiatal hernia. Findings may be related to gastritis and esophagitis. Underlying neoplasm not excluded at this level. No evidence for fluid collection or pneumomediastinum. 3. Colonic diverticulosis.  Large stool burden in the rectum. 4. Subcentimeter left Bosniak I benign renal cyst. No follow-up imaging is recommended. JACR 2018 Feb; 264-273, Management of the Incidental Renal Mass on CT, RadioGraphics 2021; 814-848, Bosniak Classification of Cystic Renal Masses, Version 2019. (Ref: J Am Coll Radiol. 2015 Feb;12(2): 143-50). Aortic Atherosclerosis (ICD10-I70.0). Electronically Signed   By: Darliss Cheney M.D.   On: 06/08/2022 17:44   DG Chest Port 1 View  Result Date: 06/08/2022 CLINICAL DATA:  Hematemesis EXAM: PORTABLE CHEST 1 VIEW COMPARISON:  Chest radiograph 1 day prior FINDINGS: The cardiomediastinal silhouette is stable, with unchanged cardiomegaly. The upper mediastinal contours are stable with unchanged prominence of the right paratracheal stripe. There is possible retrocardiac opacity suboptimally evaluated due to portable technique and overlying leads/wires. There may be a left pleural effusion. The right lung is clear. There is no right effusion. There is no pneumothorax There is no acute osseous abnormality.  IMPRESSION: Possible retrocardiac opacity and left pleural effusion, suboptimally evaluated. Consider PA and lateral chest radiographs for better evaluation if indicated. Electronically Signed   By: Lesia Hausen M.D.   On: 06/08/2022 13:53     Assessment and Plan: No notes have been filed under this hospital service. Service: Hospitalist  Principal Problem:   Vomiting Active Problems:   Essential hypertension   GERD (gastroesophageal reflux disease)   Dementia without behavioral disturbance (HCC)   Stroke (HCC)   Hemiplegia, unspecified affecting right dominant side (HCC)  Vomiting/esophagitis Observation No evidence of hematemesis here Blood counts appear stable IV fluids Protonix IV Zofran Recheck CBC in the morning GERD IV Protonix Hypertension Continue antihypertensives Dementia Continue Namenda History of stroke with hemiplegia   Advance Care Planning:   Code Status: Prior full code  Consults: None  Family Communication: None  Severity of Illness: The appropriate patient status for this patient is OBSERVATION. Observation status is judged to be reasonable and necessary in order to provide the required intensity of service to ensure the patient's safety. The patient's presenting symptoms, physical exam findings, and initial radiographic and laboratory data in the context of their medical condition is felt to place them at decreased risk for further clinical deterioration. Furthermore, it is anticipated that the patient will be medically stable for discharge from the hospital within 2 midnights of admission.   Author: Levie Heritage, DO 06/08/2022 8:05 PM  For on call review [email or web address].

## 2022-06-09 DIAGNOSIS — E785 Hyperlipidemia, unspecified: Secondary | ICD-10-CM | POA: Diagnosis present

## 2022-06-09 DIAGNOSIS — K2211 Ulcer of esophagus with bleeding: Secondary | ICD-10-CM | POA: Diagnosis present

## 2022-06-09 DIAGNOSIS — K449 Diaphragmatic hernia without obstruction or gangrene: Secondary | ICD-10-CM | POA: Diagnosis present

## 2022-06-09 DIAGNOSIS — F02C3 Dementia in other diseases classified elsewhere, severe, with mood disturbance: Secondary | ICD-10-CM | POA: Diagnosis present

## 2022-06-09 DIAGNOSIS — K922 Gastrointestinal hemorrhage, unspecified: Secondary | ICD-10-CM | POA: Diagnosis not present

## 2022-06-09 DIAGNOSIS — F17210 Nicotine dependence, cigarettes, uncomplicated: Secondary | ICD-10-CM | POA: Diagnosis present

## 2022-06-09 DIAGNOSIS — R1111 Vomiting without nausea: Secondary | ICD-10-CM

## 2022-06-09 DIAGNOSIS — R9389 Abnormal findings on diagnostic imaging of other specified body structures: Secondary | ICD-10-CM | POA: Diagnosis not present

## 2022-06-09 DIAGNOSIS — I69351 Hemiplegia and hemiparesis following cerebral infarction affecting right dominant side: Secondary | ICD-10-CM | POA: Diagnosis not present

## 2022-06-09 DIAGNOSIS — F0390 Unspecified dementia without behavioral disturbance: Secondary | ICD-10-CM | POA: Diagnosis not present

## 2022-06-09 DIAGNOSIS — R112 Nausea with vomiting, unspecified: Secondary | ICD-10-CM

## 2022-06-09 DIAGNOSIS — D72829 Elevated white blood cell count, unspecified: Secondary | ICD-10-CM | POA: Diagnosis present

## 2022-06-09 DIAGNOSIS — G8191 Hemiplegia, unspecified affecting right dominant side: Secondary | ICD-10-CM | POA: Diagnosis not present

## 2022-06-09 DIAGNOSIS — Z7982 Long term (current) use of aspirin: Secondary | ICD-10-CM | POA: Diagnosis not present

## 2022-06-09 DIAGNOSIS — K2100 Gastro-esophageal reflux disease with esophagitis, without bleeding: Secondary | ICD-10-CM | POA: Diagnosis present

## 2022-06-09 DIAGNOSIS — K209 Esophagitis, unspecified without bleeding: Secondary | ICD-10-CM | POA: Diagnosis not present

## 2022-06-09 DIAGNOSIS — I10 Essential (primary) hypertension: Secondary | ICD-10-CM | POA: Diagnosis not present

## 2022-06-09 DIAGNOSIS — K2970 Gastritis, unspecified, without bleeding: Secondary | ICD-10-CM | POA: Diagnosis present

## 2022-06-09 DIAGNOSIS — Z823 Family history of stroke: Secondary | ICD-10-CM | POA: Diagnosis not present

## 2022-06-09 DIAGNOSIS — F32A Depression, unspecified: Secondary | ICD-10-CM | POA: Diagnosis present

## 2022-06-09 DIAGNOSIS — Z79899 Other long term (current) drug therapy: Secondary | ICD-10-CM | POA: Diagnosis not present

## 2022-06-09 DIAGNOSIS — N179 Acute kidney failure, unspecified: Secondary | ICD-10-CM | POA: Diagnosis not present

## 2022-06-09 DIAGNOSIS — E876 Hypokalemia: Secondary | ICD-10-CM | POA: Diagnosis not present

## 2022-06-09 DIAGNOSIS — I119 Hypertensive heart disease without heart failure: Secondary | ICD-10-CM | POA: Diagnosis present

## 2022-06-09 DIAGNOSIS — Z8249 Family history of ischemic heart disease and other diseases of the circulatory system: Secondary | ICD-10-CM | POA: Diagnosis not present

## 2022-06-09 DIAGNOSIS — D696 Thrombocytopenia, unspecified: Secondary | ICD-10-CM | POA: Diagnosis present

## 2022-06-09 DIAGNOSIS — N400 Enlarged prostate without lower urinary tract symptoms: Secondary | ICD-10-CM | POA: Diagnosis present

## 2022-06-09 LAB — CBC
HCT: 43.5 % (ref 39.0–52.0)
Hemoglobin: 14.2 g/dL (ref 13.0–17.0)
MCH: 29.6 pg (ref 26.0–34.0)
MCHC: 32.6 g/dL (ref 30.0–36.0)
MCV: 90.8 fL (ref 80.0–100.0)
Platelets: 173 10*3/uL (ref 150–400)
RBC: 4.79 MIL/uL (ref 4.22–5.81)
RDW: 13 % (ref 11.5–15.5)
WBC: 11.1 10*3/uL — ABNORMAL HIGH (ref 4.0–10.5)
nRBC: 0 % (ref 0.0–0.2)

## 2022-06-09 LAB — BASIC METABOLIC PANEL
Anion gap: 8 (ref 5–15)
BUN: 42 mg/dL — ABNORMAL HIGH (ref 8–23)
CO2: 30 mmol/L (ref 22–32)
Calcium: 8.8 mg/dL — ABNORMAL LOW (ref 8.9–10.3)
Chloride: 107 mmol/L (ref 98–111)
Creatinine, Ser: 1.13 mg/dL (ref 0.61–1.24)
GFR, Estimated: >60 mL/min (ref >60)
Glucose, Bld: 116 mg/dL — ABNORMAL HIGH (ref 70–99)
Potassium: 3.5 mmol/L (ref 3.5–5.1)
Sodium: 145 mmol/L (ref 135–145)

## 2022-06-09 MED ORDER — SUCRALFATE 1 GM/10ML PO SUSP
1.0000 g | Freq: Four times a day (QID) | ORAL | Status: DC
  Administered 2022-06-09 – 2022-06-11 (×9): 1 g via ORAL
  Filled 2022-06-09 (×9): qty 10

## 2022-06-09 NOTE — Evaluation (Signed)
Physical Therapy Evaluation Patient Details Name: Warren Sullivan MRN: [id number] DOB: [birth year] Today's Date: 06/09/2022  History of Present Illness  Warren Sullivan is a 61 y.o. male with medical history significant of history of CVA, dementia, GERD, hyperlipidemia, hypertension.  Patient is a resident of [facility].  He was brought to the hospital for evaluation due to reports of vomiting dark blood this morning.  He was evaluated for something similar early yesterday morning.  By reports, every time he eats or drinks something he vomits.  Here, he has been given clear liquids, which he vomited.  Emesis here remains stomach contents without blood.  He was given IV fluid bolus, Zofran for antiemetics, and a dose of Protonix IV.  No fevers, chills, abdominal pain, chest pain.     There were some reports from family members that he has been increasingly confused.  Here, he appears to answer questions appropriately in relation to his dementia.   Clinical Impression  Patient demonstrates slow labored movement for sitting up at bedside, once seated frequent leaning laterally to the right that resolved after 2-3 minutes, very unsteady on feet having to lean on armrest of chair during transfer due to BLE weakness.  Patient tolerated sitting up in chair after therapy with his sister present in room.  Patient will benefit from continued skilled physical therapy in hospital and recommended venue below to increase strength, balance, endurance for safe ADLs and gait.         Recommendations for follow up therapy are one component of a multi-disciplinary discharge planning process, led by the attending physician.  Recommendations may be updated based on patient status, additional functional criteria and insurance authorization.  Follow Up Recommendations Skilled nursing-short term rehab (<3 hours/day) Can patient physically be transported by private vehicle: No    Assistance Recommended at Discharge  Intermittent Supervision/Assistance  Patient can return home with the following  A lot of help with bathing/dressing/bathroom;A lot of help with walking and/or transfers;Help with stairs or ramp for entrance;Assistance with cooking/housework    Equipment Recommendations None recommended by PT  Recommendations for Other Services       Functional Status Assessment Patient has had a recent decline in their functional status and demonstrates the ability to make significant improvements in function in a reasonable and predictable amount of time.     Precautions / Restrictions Precautions Precautions: Fall Restrictions Weight Bearing Restrictions: No      Mobility  Bed Mobility Overal bed mobility: Needs Assistance Bed Mobility: Supine to Sit     Supine to sit: Mod assist     General bed mobility comments: increased time, labored movement    Transfers Overall transfer level: Needs assistance Equipment used: 1 person hand held assist, None Transfers: Sit to/from Stand, Bed to chair/wheelchair/BSC Sit to Stand: Mod assist   Step pivot transfers: Mod assist       General transfer comment: had to lean on armrest of chair during transfer due to BLE weakness    Ambulation/Gait                  Stairs            Wheelchair Mobility    Modified Rankin (Stroke Patients Only)       Balance Overall balance assessment: Needs assistance Sitting-balance support: Feet supported, No upper extremity supported Sitting balance-Leahy Scale: Fair Sitting balance - Comments: seated at EOB   Standing balance support: During functional activity, Bilateral upper extremity supported Standing  balance-Leahy Scale: Poor Standing balance comment: leaning on armrest of chair                             Pertinent Vitals/Pain Pain Assessment Pain Assessment: No/denies pain    Home Living Family/patient expects to be discharged to:: Skilled nursing facility                         Prior Function Prior Level of Function : Needs assist       Physical Assist : Mobility (physical);ADLs (physical) Mobility (physical): Bed mobility;Transfers;Gait;Stairs   Mobility Comments: Supervised transfers to his wheelchair, sometimes requires assistance, uses wheelchair for mobility ADLs Comments: assisted by SNF staff     Hand Dominance        Extremity/Trunk Assessment   Upper Extremity Assessment Upper Extremity Assessment: Generalized weakness    Lower Extremity Assessment Lower Extremity Assessment: Generalized weakness    Cervical / Trunk Assessment Cervical / Trunk Assessment: Normal  Communication   Communication: No difficulties  Cognition Arousal/Alertness: Awake/alert Behavior During Therapy: WFL for tasks assessed/performed Overall Cognitive Status: History of cognitive impairments - at baseline                                          General Comments      Exercises     Assessment/Plan    PT Assessment Patient needs continued PT services  PT Problem List Decreased strength;Decreased activity tolerance;Decreased balance;Decreased mobility       PT Treatment Interventions DME instruction;Functional mobility training;Therapeutic activities;Therapeutic exercise;Patient/family education;Wheelchair mobility training;Balance training    PT Goals (Current goals can be found in the Care Plan section)  Acute Rehab PT Goals Patient Stated Goal: return home (SNF LTC) PT Goal Formulation: With patient/family Time For Goal Achievement: 06/23/22 Potential to Achieve Goals: Good    Frequency Min 2X/week     Co-evaluation               AM-PAC PT "6 Clicks" Mobility  Outcome Measure Help needed turning from your back to your side while in a flat bed without using bedrails?: A Lot Help needed moving from lying on your back to sitting on the side of a flat bed without using bedrails?: A Lot Help  needed moving to and from a bed to a chair (including a wheelchair)?: A Lot Help needed standing up from a chair using your arms (e.g., wheelchair or bedside chair)?: A Lot Help needed to walk in hospital room?: Total Help needed climbing 3-5 steps with a railing? : Total 6 Click Score: 10    End of Session   Activity Tolerance: Patient tolerated treatment well;Patient limited by fatigue Patient left: in chair;with call bell/phone within reach;with chair alarm set;with family/visitor present Nurse Communication: Mobility status PT Visit Diagnosis: Unsteadiness on feet (R26.81);Other abnormalities of gait and mobility (R26.89);Muscle weakness (generalized) (M62.81)    Time: 8295-6213 PT Time Calculation (min) (ACUTE ONLY): 25 min   Charges:   PT Evaluation $PT Eval Moderate Complexity: 1 Mod PT Treatments $Therapeutic Activity: 23-37 mins        1:49 PM, 06/09/22 Ocie Bob, MPT Physical Therapist with [facility] [phone] office [phone] mobile phone

## 2022-06-09 NOTE — TOC Initial Note (Signed)
Transition of Care [facility]) - Initial/Assessment Note    Patient Details  Name: Warren Sullivan MRN: [id number] Date of Birth: 09/18/1960  Transition of Care [facility]) CM/SW Contact:    Villa Herb, LCSWA Phone Number: 06/09/2022, 1:10 PM  Clinical Narrative:                 CSW noted per chart review that pt is from [facility]. CSW spoke to [place] who states pt is a resident at their facility. Pt will be able to return at D/C. TOC to follow.   Expected Discharge Plan: Long Term Nursing Home Barriers to Discharge: Continued Medical Work up   Patient Goals and CMS Choice Patient states their goals for this hospitalization and ongoing recovery are:: return to facility CMS Medicare.gov Compare Post Acute Care list provided to:: Patient Choice offered to / list presented to : Patient  Expected Discharge Plan and Services Expected Discharge Plan: Long Term Nursing Home In-house Referral: Clinical Social Work Discharge Planning Services: CM Consult Post Acute Care Choice: Nursing Home Living arrangements for the past 2 months: Skilled Nursing Facility                                      Prior Living Arrangements/Services Living arrangements for the past 2 months: Skilled Nursing Facility Lives with:: Facility Resident Patient language and need for interpreter reviewed:: Yes Do you feel safe going back to the place where you live?: Yes      Need for Family Participation in Patient Care: Yes (Comment) Care giver support system in place?: Yes (comment)   Criminal Activity/Legal Involvement Pertinent to Current Situation/Hospitalization: No - Comment as needed  Activities of Daily Living      Permission Sought/Granted                  Emotional Assessment Appearance:: Appears stated age     Orientation: : Oriented to Place, Oriented to Self Alcohol / Substance Use: Not Applicable Psych Involvement: No (comment)  Admission diagnosis:  Vomiting  [R11.10] Esophagitis [K20.90] Vomiting, unspecified vomiting type, unspecified whether nausea present [R11.10] Patient Active Problem List   Diagnosis Date Noted   Esophagitis 06/09/2022   Hiatal hernia 06/09/2022   Abnormal CT of the chest 06/09/2022   Stroke (HCC) 06/08/2022   Hemiplegia, unspecified affecting right dominant side (HCC) 06/08/2022   Emesis, persistent 01/29/2022   Helicobacter pylori duodenitis 11/06/2021   Fall at home, initial encounter 08/29/2021   Pneumonia 08/28/2021   Hypokalemia 08/18/2021   Hyponatremia 08/18/2021   AKI (acute kidney injury) (HCC) 08/18/2021   Hypocalcemia 08/18/2021   Hyperglycemia 08/18/2021   Vomiting 08/18/2021   GERD (gastroesophageal reflux disease) 08/18/2021   Dehydration 08/18/2021   Hyperlipidemia, unspecified 08/18/2021   Dementia without behavioral disturbance (HCC) 08/18/2021   Small bowel obstruction (HCC) 08/17/2021   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 [place] [address] Phone: [phone] Fax: [phone]  [facility]  - [place], [place] - [address] [place] [address] Phone: [phone] Fax: [phone]  Social Determinants of Health (SDOH) Interventions    Readmission Risk Interventions     No data to display           

## 2022-06-09 NOTE — Plan of Care (Signed)

## 2022-06-09 NOTE — Consult Note (Signed)
Consulting  Provider: Dr. Hanley Ben Primary Care Physician:  Sherol Dade, DO Primary Gastroenterologist:  Dr. Levon Hedger   Reason for Consultation: Nausea vomiting, abnormal CT  HPI:  Warren Sullivan is a 61 y.o. male with a past medical history of dementia, CVA, hemiaplasia, dyslipidemia, depression, GERD with esophagitis, hiatal hernia, who was brought to the hospital from nursing home due to intractable nausea and vomiting, with suspected bloody emesis.  Patient is a poor historian.  His sister Mardene Celeste is bedside who provides majority of HPI.  Notes nausea and vomiting x 2 days.  Unable to eat or drink anything despite having an appetite.  Vomited clear in the ER, no blood.  Given small sips of Sprite yesterday evening which she subsequently vomited.  Underwent CT, abdomen, pelvis chest which I personally reviewed which showed findings compatible with diffuse esophagitis, hiatal hernia, stable findings draining small lymph nodes surrounding the GE junction.  Patient well-known to the GI service has been seen previously for similar complaints and abnormal CT scans.  Has had 3 upper endoscopies since February:  EGD February 2023 found to have esophagitis with ulceration, peptic duodenitis, active chronic gastritis with H. pylori treated with bismuth quad therapy,   EGD in March 2023 with erythematous duodenopathy, mild carditis, chronic gastritis with H. pylori s/p treatment with levofloxacin salvage therapy.   EGD July 2023 with hiatal hernia, submucosal nodule in the lower third of the esophagus, normal stomach, normal duodenum.  Gastric biopsies with intestinal metaplasia, negative for H. pylori.    Past Medical History:  Diagnosis Date   CVA (cerebral vascular accident) (HCC)    Dementia in other diseases classified elsewhere, severe, without behavioral disturbance, psychotic disturbance, mood disturbance, and anxiety (HCC)    GERD (gastroesophageal reflux disease)     Hemiplegia, unspecified affecting right dominant side (HCC)    Hyperlipidemia    Hypertension    MDD (major depressive disorder)    Stroke (HCC)    Vitamin D deficiency     Past Surgical History:  Procedure Laterality Date   BIOPSY  08/18/2021   Procedure: BIOPSY;  Surgeon: Dolores Frame, MD;  [facility];  Service: Gastroenterology;;  Bonnielee Haff and distal esophagus biopsies    BIOPSY  10/03/2021   Procedure: BIOPSY;  Surgeon: Dolores Frame, MD;  [facility]: [facility];  Service: Gastroenterology;;   BIOPSY  01/30/2022   Procedure: BIOPSY;  Surgeon: Dolores Frame, MD;  [facility];  Service: Gastroenterology;;   ESOPHAGOGASTRODUODENOSCOPY (EGD) WITH PROPOFOL N/A 08/18/2021   Surgeon: Marguerita Merles, Reuel Boom, MD; severe acute nonspecific esophagitis with ulceration and reactive hyperplasia, peptic duodenitis, acute chronic gastritis with H. pylori, 3 cm hiatal hernia.   ESOPHAGOGASTRODUODENOSCOPY (EGD) WITH PROPOFOL N/A 10/03/2021   Surgeon: Marguerita Merles, Reuel Boom, MD; myocarditis, chronic gastritis with H. pylori, 2 cm hiatal hernia, erythematous duodenopathy.   ESOPHAGOGASTRODUODENOSCOPY (EGD) WITH PROPOFOL N/A 01/30/2022   Procedure: ESOPHAGOGASTRODUODENOSCOPY (EGD) WITH PROPOFOL;  Surgeon: Dolores Frame, MD;  Location: [facility];  Service: Gastroenterology;  Laterality: N/A;   HERNIA REPAIR      Prior to Admission medications   Medication Sig Start Date End Date Taking? Authorizing Provider  acetaminophen (TYLENOL) 650 MG CR tablet Take 1,300 mg by mouth every 8 (eight) hours as needed (general discomfort).   Yes [provider]  albuterol (PROVENTIL) (2.5 MG/3ML) 0.083% nebulizer solution Take 3 mLs (2.5 mg total) by nebulization every 4 (four) hours as needed for wheezing or shortness of breath. 01/30/22  Yes Johnson, Clanford L, MD  atorvastatin (LIPITOR) 20 MG tablet Take 1 tablet (20 mg  total) by mouth daily. Patient taking differently: Take 20 mg by mouth at bedtime. 08/20/21  Yes Emokpae, Courage, MD  Calcium Carbonate (CALCIUM 500 PO) Take 1 tablet by mouth daily.   Yes [provider]  Cholecalciferol 25 MCG (1000 UT) capsule Take 5,000 Units by mouth daily.   Yes [provider]  cloNIDine (CATAPRES) 0.1 MG tablet Take 2 tablets (0.2 mg total) by mouth 2 (two) times daily. 08/20/21  Yes Emokpae, Courage, MD  gabapentin (NEURONTIN) 300 MG capsule Take 1 capsule (300 mg total) by mouth 3 (three) times daily. Start with one at bedtime for 1 week. Then two at bedtime for one week, then go to full dose. 10/19/15  Yes Stacks, Cletus Gash, MD  losartan (COZAAR) 50 MG tablet Take 50 mg by mouth daily. 04/20/22  Yes [provider]  memantine (NAMENDA) 10 MG tablet Take 10 mg by mouth 2 (two) times daily. 07/24/21  Yes [provider]  Multiple Vitamin (MULTIVITAMIN) tablet Take 1 tablet by mouth daily.   Yes [provider]  ondansetron (ZOFRAN) 4 MG tablet Take 4 mg by mouth every 8 (eight) hours as needed for nausea or vomiting. 02/26/22  Yes [provider]  pantoprazole (PROTONIX) 40 MG tablet Take 1 tablet (40 mg total) by mouth 2 (two) times daily. 08/20/21 08/20/22 Yes Emokpae, Courage, MD  tamsulosin (FLOMAX) 0.4 MG CAPS capsule Take 0.4 mg by mouth daily. 07/11/21  Yes [provider]  VASCEPA 1 g capsule Take 2 g by mouth 2 (two) times daily. 08/08/21  Yes [provider]  Venlafaxine HCl 75 MG TB24 Take 75 mg by mouth daily.   Yes [provider]  vitamin B-12 (CYANOCOBALAMIN) 500 MCG tablet Take 500 mcg by mouth daily.   Yes [provider]  aspirin EC 81 MG tablet Take 1 tablet (81 mg total) by mouth daily with breakfast. Swallow whole. 08/20/21   Roxan Hockey, MD  losartan (COZAAR) 25 MG tablet Take 1 tablet (25 mg total) by mouth daily. Patient not taking: Reported on 06/08/2022 02/02/22    Murlean Iba, MD    Current Facility-Administered Medications  Medication Dose Route Frequency Provider Last Rate Last Admin   atorvastatin (LIPITOR) tablet 20 mg  20 mg Oral QHS Truett Mainland, DO   20 mg at 06/08/22 2254   cloNIDine (CATAPRES) tablet 0.2 mg  0.2 mg Oral BID Truett Mainland, DO   0.2 mg at 06/09/22 0945   gabapentin (NEURONTIN) capsule 300 mg  300 mg Oral TID Truett Mainland, DO   300 mg at 06/09/22 0945   hydrALAZINE (APRESOLINE) injection 10 mg  10 mg Intravenous Q6H PRN Adefeso, Oladapo, DO       icosapent Ethyl (VASCEPA) 1 g capsule 2 g  2 g Oral BID Truett Mainland, DO   2 g at 06/09/22 0945   lactated ringers infusion   Intravenous Continuous Truett Mainland, DO 100 mL/hr at 06/09/22 0454 Infusion Verify at 06/09/22 0454   losartan (COZAAR) tablet 50 mg  50 mg Oral Daily Truett Mainland, DO   50 mg at 06/09/22 0945   memantine (NAMENDA) tablet 10 mg  10 mg Oral BID Truett Mainland, DO   10 mg at 06/09/22 0945   ondansetron (ZOFRAN) injection 4 mg  4 mg Intravenous Q6H PRN Truett Mainland, DO   4 mg  at 06/08/22 2315   pantoprazole (PROTONIX) injection 40 mg  40 mg Intravenous Q12H Truett Mainland, DO   40 mg at 06/09/22 0945   sucralfate (CARAFATE) 1 GM/10ML suspension 1 g  1 g Oral Q6H Shreena Baines K, DO       tamsulosin (FLOMAX) capsule 0.4 mg  0.4 mg Oral Daily Truett Mainland, DO   0.4 mg at 06/09/22 0945   venlafaxine XR (EFFEXOR-XR) 24 hr capsule 75 mg  75 mg Oral Daily Truett Mainland, DO   75 mg at 06/09/22 0945    Allergies as of 06/08/2022   (No Known Allergies)    Family History  Problem Relation Age of Onset   Hypertension Mother    Stroke Mother    Hypertension Father    Hypertension Sister     Social History   Socioeconomic History   Marital status: Divorced    Spouse name: Not on file   Number of children: Not on file   Years of education: Not on file   Highest education level: Not on file  Occupational History   Not  on file  Tobacco Use   Smoking status: Every Day    Packs/day: 0.25    Types: Cigarettes   Smokeless tobacco: Not on file  Vaping Use   Vaping Use: Never used  Substance and Sexual Activity   Alcohol use: No   Drug use: No   Sexual activity: Not on file  Other Topics Concern   Not on file  Social History Narrative   Not on file   Social Determinants of Health   Financial Resource Strain: Not on file  Food Insecurity: No Food Insecurity (06/09/2022)   Hunger Vital Sign    Worried About Running Out of Food in the Last Year: Never true    Ran Out of Food in the Last Year: Never true  Transportation Needs: No Transportation Needs (06/09/2022)   PRAPARE - Hydrologist (Medical): No    Lack of Transportation (Non-Medical): No  Physical Activity: Not on file  Stress: Not on file  Social Connections: Not on file  Intimate Partner Violence: Not At Risk (06/09/2022)   Humiliation, Afraid, Rape, and Kick questionnaire    Fear of Current or Ex-Partner: No    Emotionally Abused: No    Physically Abused: No    Sexually Abused: No    Review of Systems: General: Negative for anorexia, weight loss, fever, chills, fatigue, weakness. Eyes: Negative for vision changes.  ENT: Negative for hoarseness, difficulty swallowing , nasal congestion. CV: Negative for chest pain, angina, palpitations, dyspnea on exertion, peripheral edema.  Respiratory: Negative for dyspnea at rest, dyspnea on exertion, cough, sputum, wheezing.  GI: See history of present illness. GU:  Negative for dysuria, hematuria, urinary incontinence, urinary frequency, nocturnal urination.  MS: Negative for joint pain, low back pain.  Derm: Negative for rash or itching.  Neuro: Negative for weakness, abnormal sensation, seizure, frequent headaches, memory loss, confusion.  Psych: Negative for anxiety, depression Endo: Negative for unusual weight change.  Heme: Negative for bruising or  bleeding. Allergy: Negative for rash or hives.  Physical Exam: Vital signs in last 24 hours: Temp:  [98.1 F (36.7 C)-98.8 F (37.1 C)] 98.6 F (37 C) (12/02 0813) Pulse Rate:  [77-107] 83 (12/02 0813) Resp:  [10-31] 19 (12/02 0813) BP: (118-191)/(76-118) 131/83 (12/02 0813) SpO2:  [90 %-99 %] 93 % (12/02 0813) FiO2 (%):  [28 %] 28 % (  12/01 2130) Weight:  [96.6 kg] 96.6 kg (12/01 2132)   General:   Alert,  Well-developed, well-nourished, pleasant and cooperative in NAD Head:  Normocephalic and atraumatic. Eyes:  Sclera clear, no icterus.   Conjunctiva pink. Ears:  Normal auditory acuity. Nose:  No deformity, discharge,  or lesions. Mouth:  No deformity or lesions, dentition normal. Neck:  Supple; no masses or thyromegaly. Lungs:  Clear throughout to auscultation.   No wheezes, crackles, or rhonchi. No acute distress. Heart:  Regular rate and rhythm; no murmurs, clicks, rubs,  or gallops. Abdomen:  Soft, nontender and nondistended. No masses, hepatosplenomegaly or hernias noted. Normal bowel sounds, without guarding, and without rebound.   Msk:  Symmetrical without gross deformities. Normal posture. Pulses:  Normal pulses noted. Extremities:  Without clubbing or edema. Neurologic:  Alert and  oriented x4;  grossly normal neurologically. Skin:  Intact without significant lesions or rashes. Cervical Nodes:  No significant cervical adenopathy. Psych:  Alert and cooperative. Normal mood and affect.  Intake/Output from previous day: 12/01 0701 - 12/02 0700 In: 1896.8 [P.O.:240; I.V.:656.8; IV Piggyback:1000] Out: 1000 [Urine:1000] Intake/Output this shift: No intake/output data recorded.  Lab Results: Recent Labs    06/07/22 0125 06/08/22 0910 06/09/22 0512  WBC 10.0 16.3* 11.1*  HGB 15.7 16.3 14.2  HCT 46.6 47.6 43.5  PLT 188 199 173   BMET Recent Labs    06/07/22 0125 06/08/22 0910 06/09/22 0512  NA 141 145 145  K 3.9 3.6 3.5  CL 99 100 107  CO2 30 29 30    GLUCOSE 132* 159* 116*  BUN 26* 41* 42*  CREATININE 1.11 1.21 1.13  CALCIUM 9.3 9.7 8.8*   LFT Recent Labs    06/07/22 0125 06/08/22 0910  PROT 9.1* 9.0*  ALBUMIN 4.4 4.4  AST 18 17  ALT 16 15  ALKPHOS 82 76  BILITOT 0.5 0.6   PT/INR No results for input(s): "LABPROT", "INR" in the last 72 hours. Hepatitis Panel No results for input(s): "HEPBSAG", "HCVAB", "HEPAIGM", "HEPBIGM" in the last 72 hours. C-Diff No results for input(s): "CDIFFTOX" in the last 72 hours.  Studies/Results: CT CHEST ABDOMEN PELVIS W CONTRAST  Result Date: 06/08/2022 CLINICAL DATA:  Vomiting dark red blood. History of esophageal mass. EXAM: CT CHEST, ABDOMEN, AND PELVIS WITH CONTRAST TECHNIQUE: Multidetector CT imaging of the chest, abdomen and pelvis was performed following the standard protocol during bolus administration of intravenous contrast. RADIATION DOSE REDUCTION: This exam was performed according to the departmental dose-optimization program which includes automated exposure control, adjustment of the mA and/or kV according to patient size and/or use of iterative reconstruction technique. CONTRAST:  166mL OMNIPAQUE IOHEXOL 300 MG/ML  SOLN COMPARISON:  CT abdomen and pelvis 08/17/2021.  CT chest 08/19/2021. FINDINGS: CT CHEST FINDINGS Cardiovascular: No significant vascular findings. Normal heart size. No pericardial effusion. There are atherosclerotic calcifications of the aorta and coronary arteries. Mediastinum/Nodes: There are no enlarged mediastinal or hilar lymph nodes. There is a right thyroid nodule measuring 1 cm. There is a small hiatal hernia, unchanged. There is circumferential wall thickening of the entire esophagus most significant in the mid and lower esophagus. There are small paraesophageal lymph nodes similar to prior. There is mild stranding surrounding the gastroesophageal junction without evidence for fluid collection or pneumomediastinum. Findings are similar to the prior study.  Lungs/Pleura: There are scattered atelectatic changes in both lungs. There are left-sided pleural calcifications similar to the prior study. There is no new focal lung infiltrate or pleural effusion. There  is no pneumothorax. Trachea and central airways are patent. Musculoskeletal: No chest wall mass or suspicious bone lesions identified. No acute fractures. There are healed left-sided ninth and tenth rib fractures. CT ABDOMEN PELVIS FINDINGS Hepatobiliary: No focal liver abnormality is seen. No gallstones, gallbladder wall thickening, or biliary dilatation. Pancreas: Unremarkable. No pancreatic ductal dilatation or surrounding inflammatory changes. Spleen: Normal in size without focal abnormality. Adrenals/Urinary Tract: Subcentimeter cyst in the left kidney is unchanged. There is no hydronephrosis or perinephric stranding. The adrenal glands and bladder are within normal limits. Stomach/Bowel: Small hiatal hernia is present. Otherwise, the stomach is within normal limits. No bowel obstruction, pneumatosis or free air. Appendix within normal limits. There is colonic diverticulosis. There is large stool burden in the rectum. Vascular/Lymphatic: Aortic atherosclerosis. No enlarged abdominal or pelvic lymph nodes. Reproductive: Prostate is unremarkable. Other: No abdominal wall hernia or abnormality. No abdominopelvic ascites. Musculoskeletal: No acute or significant osseous findings. IMPRESSION: 1. Findings compatible with a diffuse esophagitis. 2. Stable findings stranding and small lymph nodes surrounding the gastroesophageal junction with small hiatal hernia. Findings may be related to gastritis and esophagitis. Underlying neoplasm not excluded at this level. No evidence for fluid collection or pneumomediastinum. 3. Colonic diverticulosis.  Large stool burden in the rectum. 4. Subcentimeter left Bosniak I benign renal cyst. No follow-up imaging is recommended. JACR 2018 Feb; 264-273, Management of the Incidental  Renal Mass on CT, RadioGraphics 2021; 814-848, Bosniak Classification of Cystic Renal Masses, Version 2019. (Ref: J Am Coll Radiol. 2015 Feb;12(2): 143-50). Aortic Atherosclerosis (ICD10-I70.0). Electronically Signed   By: Ronney Asters M.D.   On: 06/08/2022 17:44   DG Chest Port 1 View  Result Date: 06/08/2022 CLINICAL DATA:  Hematemesis EXAM: PORTABLE CHEST 1 VIEW COMPARISON:  Chest radiograph 1 day prior FINDINGS: The cardiomediastinal silhouette is stable, with unchanged cardiomegaly. The upper mediastinal contours are stable with unchanged prominence of the right paratracheal stripe. There is possible retrocardiac opacity suboptimally evaluated due to portable technique and overlying leads/wires. There may be a left pleural effusion. The right lung is clear. There is no right effusion. There is no pneumothorax There is no acute osseous abnormality. IMPRESSION: Possible retrocardiac opacity and left pleural effusion, suboptimally evaluated. Consider PA and lateral chest radiographs for better evaluation if indicated. Electronically Signed   By: Valetta Mole M.D.   On: 06/08/2022 13:53    Impression: *Nausea and vomiting *Abnormal CT imaging *Chronic GERD with esophagitis *Hiatal hernia  Plan: Patient with extensive upper GI evaluation with 3 EGDs this year alone.  CT imaging appears similar to prior.  Discussed with patient and sister who is bedside.  Do not think we need to pursue EGD at this time.  Will pursue supportive measures instead.  IV PPI twice daily.  We will add Carafate liquid every 6 hours.  Start on clear liquid diet today and advance as tolerated.  Antiemetics as needed.  We will see how he does today.  If he has ongoing nausea and vomiting, can consider upper endoscopy in the a.m.  Thank you for the consultation  Elon Alas. Abbey Chatters, D.O. Gastroenterology and Hepatology [facility]    LOS: 0 days     06/09/2022, 12:07 PM

## 2022-06-09 NOTE — Progress Notes (Signed)
PROGRESS NOTE    Warren Sullivan  XBD:[id number] DOB: [birth year] DOA: 06/08/2022 PCP: Sherol Dade, DO   Brief Narrative:  61 y.o. male with medical history significant of history of CVA, dementia, GERD, hyperlipidemia, hypertension, resident of [facility] presented with hematemesis and increased confusion.  On presentation, CT of chest, abdomen and pelvis with contrast showed findings suggestive of diffuse esophagitis along with gastritis; underlying neoplasm could not be excluded.  Hemoglobin 16.3 on presentation.  He was started on IV Protonix.  Assessment & Plan:   Probable upper GI bleeding presenting with hematemesis Esophagitis/gastritis -Presented with hematemesis and was found to have imaging findings suggestive of diffuse esophagitis along with gastritis, underlying neoplasm could not be excluded.  Initial hemoglobin was 16.3.  Currently on IV Protonix. -Switch to n.p.o.  Continue antiemetics as needed.  I have consulted GI.  Follow recommendations.  Monitor H&H  Leukocytosis -Possibly reactive.  Improving.  Monitor  Hypertension -Monitor blood pressure.  Continue clonidine, losartan, tamsulosin  BPH -Continue tamsulosin  Dementia -Continue memantine  Hyperlipidemia -Continue statin  Depression -Continue venlafaxine XR  History of unspecified stroke with hemiplegia -Outpatient follow-up  Physical deconditioning -PT eval   DVT prophylaxis: SCDs Code Status: Full Family Communication: None at bedside Disposition Plan: Status is: Observation The patient will require care spanning > 2 midnights and should be moved to inpatient because: Of H&H monitoring and GI evaluation    Consultants: GI  Procedures: None  Antimicrobials: None   Subjective: Patient seen and examined at bedside.  Awake, poor historian, slightly confused to time.  No fever, chest pain, agitation reported.  Patient denies any current nausea or  vomiting.  Objective: Vitals:   06/09/22 0021 06/09/22 0454 06/09/22 0501 06/09/22 0813  BP: (!) 118/93 (!) 128/96 125/76 131/83  Pulse:   80 83  Resp:    19  Temp:   98.4 F (36.9 C) 98.6 F (37 C)  TempSrc:   Oral Oral  SpO2:   90% 93%  Weight:      Height:        Intake/Output Summary (Last 24 hours) at 06/09/2022 0943 Last data filed at 06/09/2022 0500 Gross per 24 hour  Intake 1896.76 ml  Output 1000 ml  Net 896.76 ml   Filed Weights   06/08/22 0859 06/08/22 2132  Weight: 96.6 kg 96.6 kg    Examination:  General exam: Appears calm and comfortable.  Looks chronically ill and deconditioned.  Currently on room air. Respiratory system: Bilateral decreased breath sounds at bases Cardiovascular system: S1 & S2 heard, Rate controlled Gastrointestinal system: Abdomen is nondistended, soft and nontender. Normal bowel sounds heard. Extremities: No cyanosis, clubbing, edema  Central nervous system: Awake, extremely slow to respond, poor historian and slightly confused to time.  No focal neurological deficits. Moving extremities Skin: No rashes, lesions or ulcers Psychiatry: Extremely flat affect.  Not agitated.    Data Reviewed: I have personally reviewed following labs and imaging studies  CBC: Recent Labs  Lab 06/07/22 0125 06/08/22 0910 06/09/22 0512  WBC 10.0 16.3* 11.1*  NEUTROABS 7.3  --   --   HGB 15.7 16.3 14.2  HCT 46.6 47.6 43.5  MCV 88.3 88.0 90.8  PLT 188 199 173   Basic Metabolic Panel: Recent Labs  Lab 06/07/22 0125 06/08/22 0910 06/09/22 0512  NA 141 145 145  K 3.9 3.6 3.5  CL 99 100 107  CO2 30 29 30   GLUCOSE 132* 159* 116*  BUN 26* 41* 42*  CREATININE 1.11 1.21 1.13  CALCIUM 9.3 9.7 8.8*   GFR: Estimated Creatinine Clearance: 79.3 mL/min (by C-G formula based on SCr of 1.13 mg/dL). Liver Function Tests: Recent Labs  Lab 06/07/22 0125 06/08/22 0910  AST 18 17  ALT 16 15  ALKPHOS 82 76  BILITOT 0.5 0.6  PROT 9.1* 9.0*  ALBUMIN  4.4 4.4   Recent Labs  Lab 06/07/22 0125  LIPASE 30   No results for input(s): "AMMONIA" in the last 168 hours. Coagulation Profile: No results for input(s): "INR", "PROTIME" in the last 168 hours. Cardiac Enzymes: No results for input(s): "CKTOTAL", "CKMB", "CKMBINDEX", "TROPONINI" in the last 168 hours. BNP (last 3 results) No results for input(s): "PROBNP" in the last 8760 hours. HbA1C: No results for input(s): "HGBA1C" in the last 72 hours. CBG: No results for input(s): "GLUCAP" in the last 168 hours. Lipid Profile: No results for input(s): "CHOL", "HDL", "LDLCALC", "TRIG", "CHOLHDL", "LDLDIRECT" in the last 72 hours. Thyroid Function Tests: No results for input(s): "TSH", "T4TOTAL", "FREET4", "T3FREE", "THYROIDAB" in the last 72 hours. Anemia Panel: No results for input(s): "VITAMINB12", "FOLATE", "FERRITIN", "TIBC", "IRON", "RETICCTPCT" in the last 72 hours. Sepsis Labs: No results for input(s): "PROCALCITON", "LATICACIDVEN" in the last 168 hours.  No results found for this or any previous visit (from the past 240 hour(s)).       Radiology Studies: CT CHEST ABDOMEN PELVIS W CONTRAST  Result Date: 06/08/2022 CLINICAL DATA:  Vomiting dark red blood. History of esophageal mass. EXAM: CT CHEST, ABDOMEN, AND PELVIS WITH CONTRAST TECHNIQUE: Multidetector CT imaging of the chest, abdomen and pelvis was performed following the standard protocol during bolus administration of intravenous contrast. RADIATION DOSE REDUCTION: This exam was performed according to the departmental dose-optimization program which includes automated exposure control, adjustment of the mA and/or kV according to patient size and/or use of iterative reconstruction technique. CONTRAST:  OMNIPAQUE IOHEXOL 300 MG/ML  SOLN COMPARISON:  CT abdomen and pelvis 08/17/2021.  CT chest 08/19/2021. FINDINGS: CT CHEST FINDINGS Cardiovascular: No significant vascular findings. Normal heart size. No pericardial  effusion. There are atherosclerotic calcifications of the aorta and coronary arteries. Mediastinum/Nodes: There are no enlarged mediastinal or hilar lymph nodes. There is a right thyroid nodule measuring 1 cm. There is a small hiatal hernia, unchanged. There is circumferential wall thickening of the entire esophagus most significant in the mid and lower esophagus. There are small paraesophageal lymph nodes similar to prior. There is mild stranding surrounding the gastroesophageal junction without evidence for fluid collection or pneumomediastinum. Findings are similar to the prior study. Lungs/Pleura: There are scattered atelectatic changes in both lungs. There are left-sided pleural calcifications similar to the prior study. There is no new focal lung infiltrate or pleural effusion. There is no pneumothorax. Trachea and central airways are patent. Musculoskeletal: No chest wall mass or suspicious bone lesions identified. No acute fractures. There are healed left-sided ninth and tenth rib fractures. CT ABDOMEN PELVIS FINDINGS Hepatobiliary: No focal liver abnormality is seen. No gallstones, gallbladder wall thickening, or biliary dilatation. Pancreas: Unremarkable. No pancreatic ductal dilatation or surrounding inflammatory changes. Spleen: Normal in size without focal abnormality. Adrenals/Urinary Tract: Subcentimeter cyst in the left kidney is unchanged. There is no hydronephrosis or perinephric stranding. The adrenal glands and bladder are within normal limits. Stomach/Bowel: Small hiatal hernia is present. Otherwise, the stomach is within normal limits. No bowel obstruction, pneumatosis or free air. Appendix within normal limits. There is colonic diverticulosis. There is large stool burden in the rectum.  Vascular/Lymphatic: Aortic atherosclerosis. No enlarged abdominal or pelvic lymph nodes. Reproductive: Prostate is unremarkable. Other: No abdominal wall hernia or abnormality. No abdominopelvic ascites.  Musculoskeletal: No acute or significant osseous findings. IMPRESSION: 1. Findings compatible with a diffuse esophagitis. 2. Stable findings stranding and small lymph nodes surrounding the gastroesophageal junction with small hiatal hernia. Findings may be related to gastritis and esophagitis. Underlying neoplasm not excluded at this level. No evidence for fluid collection or pneumomediastinum. 3. Colonic diverticulosis.  Large stool burden in the rectum. 4. Subcentimeter left Bosniak I benign renal cyst. No follow-up imaging is recommended. JACR 2018 Feb; 264-273, Management of the Incidental Renal Mass on CT, Radio[employer] 2021; 814-848, Bosniak Classification of Cystic Renal Masses, Version 2019. (Ref: J Am Coll Radiol. 2015 Feb;12(2): 143-50). Aortic Atherosclerosis (ICD10-I70.0). Electronically Signed   By: Darliss Cheney M.D.   On: 06/08/2022 17:44   DG Chest Port 1 View  Result Date: 06/08/2022 CLINICAL DATA:  Hematemesis EXAM: PORTABLE CHEST 1 VIEW COMPARISON:  Chest radiograph 1 day prior FINDINGS: The cardiomediastinal silhouette is stable, with unchanged cardiomegaly. The upper mediastinal contours are stable with unchanged prominence of the right paratracheal stripe. There is possible retrocardiac opacity suboptimally evaluated due to portable technique and overlying leads/wires. There may be a left pleural effusion. The right lung is clear. There is no right effusion. There is no pneumothorax There is no acute osseous abnormality. IMPRESSION: Possible retrocardiac opacity and left pleural effusion, suboptimally evaluated. Consider PA and lateral chest radiographs for better evaluation if indicated. Electronically Signed   By: Lesia Hausen M.D.   On: 06/08/2022 13:53        Scheduled Meds:  atorvastatin  20 mg Oral QHS   cloNIDine  0.2 mg Oral BID   gabapentin  300 mg Oral TID   icosapent Ethyl  2 g Oral BID   losartan  50 mg Oral Daily   memantine  10 mg Oral BID   pantoprazole  40 mg  Intravenous Q12H   tamsulosin  0.4 mg Oral Daily   venlafaxine XR  75 mg Oral Daily   Continuous Infusions:  lactated ringers 100 mL/hr at 06/09/22 0454          Glade Lloyd, MD [facility] 06/09/2022, 9:43 AM

## 2022-06-09 NOTE — Care Management Obs Status (Signed)
MEDICARE OBSERVATION STATUS NOTIFICATION   Patient Details  Name: Warren Sullivan MRN: [id number] Date of Birth: [birth year]   Medicare Observation Status Notification Given:  Yes    Villa Herb, LCSWA 06/09/2022, 1:12 PM

## 2022-06-09 NOTE — Plan of Care (Signed)
  Problem: Acute Rehab PT Goals(only PT should resolve) Goal: Pt Will Go Supine/Side To Sit Outcome: Progressing Flowsheets (Taken 06/09/2022 1351) Pt will go Supine/Side to Sit:  with min guard assist  with minimal assist Goal: Patient Will Transfer Sit To/From Stand Outcome: Progressing Flowsheets (Taken 06/09/2022 1351) Patient will transfer sit to/from stand:  with minimal assist  with moderate assist Goal: Pt Will Transfer Bed To Chair/Chair To Bed Outcome: Progressing Flowsheets (Taken 06/09/2022 1351) Pt will Transfer Bed to Chair/Chair to Bed:  with min assist  with mod assist Problem: Acute Rehab PT Goals(only PT should resolve) Goal: Patient Will Perform Sitting Balance Flowsheets (Taken 06/09/2022 1352) Patient will perform sitting balance: with modified independence  1:52 PM, 06/09/22 Ocie Bob, MPT Physical Therapist with [facility] 336 [phone] office [phone] mobile phone

## 2022-06-10 DIAGNOSIS — R1111 Vomiting without nausea: Secondary | ICD-10-CM | POA: Diagnosis not present

## 2022-06-10 DIAGNOSIS — K209 Esophagitis, unspecified without bleeding: Secondary | ICD-10-CM

## 2022-06-10 DIAGNOSIS — F0390 Unspecified dementia without behavioral disturbance: Secondary | ICD-10-CM | POA: Diagnosis not present

## 2022-06-10 DIAGNOSIS — I10 Essential (primary) hypertension: Secondary | ICD-10-CM | POA: Diagnosis not present

## 2022-06-10 DIAGNOSIS — K922 Gastrointestinal hemorrhage, unspecified: Secondary | ICD-10-CM

## 2022-06-10 LAB — COMPREHENSIVE METABOLIC PANEL
ALT: 10 U/L (ref 0–44)
AST: 10 U/L — ABNORMAL LOW (ref 15–41)
Albumin: 3.2 g/dL — ABNORMAL LOW (ref 3.5–5.0)
Alkaline Phosphatase: 50 U/L (ref 38–126)
Anion gap: 7 (ref 5–15)
BUN: 59 mg/dL — ABNORMAL HIGH (ref 8–23)
CO2: 30 mmol/L (ref 22–32)
Calcium: 8.5 mg/dL — ABNORMAL LOW (ref 8.9–10.3)
Chloride: 107 mmol/L (ref 98–111)
Creatinine, Ser: 2.17 mg/dL — ABNORMAL HIGH (ref 0.61–1.24)
GFR, Estimated: 33 mL/min — ABNORMAL LOW (ref >60)
Glucose, Bld: 108 mg/dL — ABNORMAL HIGH (ref 70–99)
Potassium: 3.4 mmol/L — ABNORMAL LOW (ref 3.5–5.1)
Sodium: 144 mmol/L (ref 135–145)
Total Bilirubin: 0.8 mg/dL (ref 0.3–1.2)
Total Protein: 6.3 g/dL — ABNORMAL LOW (ref 6.5–8.1)

## 2022-06-10 LAB — CBC WITH DIFFERENTIAL/PLATELET
Abs Immature Granulocytes: 0.03 10*3/uL (ref 0.00–0.07)
Basophils Absolute: 0.1 10*3/uL (ref 0.0–0.1)
Basophils Relative: 1 %
Eosinophils Absolute: 0.3 10*3/uL (ref 0.0–0.5)
Eosinophils Relative: 3 %
HCT: 37.5 % — ABNORMAL LOW (ref 39.0–52.0)
Hemoglobin: 12.2 g/dL — ABNORMAL LOW (ref 13.0–17.0)
Immature Granulocytes: 0 %
Lymphocytes Relative: 30 %
Lymphs Abs: 2.9 10*3/uL (ref 0.7–4.0)
MCH: 30 pg (ref 26.0–34.0)
MCHC: 32.5 g/dL (ref 30.0–36.0)
MCV: 92.1 fL (ref 80.0–100.0)
Monocytes Absolute: 1 10*3/uL (ref 0.1–1.0)
Monocytes Relative: 10 %
Neutro Abs: 5.6 10*3/uL (ref 1.7–7.7)
Neutrophils Relative %: 56 %
Platelets: 144 10*3/uL — ABNORMAL LOW (ref 150–400)
RBC: 4.07 MIL/uL — ABNORMAL LOW (ref 4.22–5.81)
RDW: 13.2 % (ref 11.5–15.5)
WBC: 9.9 10*3/uL (ref 4.0–10.5)
nRBC: 0 % (ref 0.0–0.2)

## 2022-06-10 LAB — MAGNESIUM: Magnesium: 2.1 mg/dL (ref 1.7–2.4)

## 2022-06-10 MED ORDER — SODIUM CHLORIDE 0.9 % IV SOLN: INTRAVENOUS | Status: DC

## 2022-06-10 MED ORDER — TAMSULOSIN HCL 0.4 MG PO CAPS
0.4000 mg | ORAL_CAPSULE | Freq: Every day | ORAL | Status: DC
  Administered 2022-06-10: 0.4 mg via ORAL
  Filled 2022-06-10: qty 1

## 2022-06-10 NOTE — Progress Notes (Signed)
Subjective: Patient without complaints today.  Sisters not bedside.  Discussed with RN who states patient has done well with full liquids.  No nausea or vomiting.  Creatinine up.  Objective: Vital signs in last 24 hours: Temp:  [98.3 F (36.8 C)-99.8 F (37.7 C)] 98.4 F (36.9 C) (12/03 0506) Pulse Rate:  [62-69] 62 (12/03 0506) Resp:  [20] 20 (12/03 0506) BP: (80-109)/(62-71) 109/71 (12/03 0506) SpO2:  [88 %-95 %] 88 % (12/03 0506) Weight:  [101.9 kg] 101.9 kg (12/03 0500)   General:   Alert and oriented, pleasant Head:  Normocephalic and atraumatic. Eyes:  No icterus, sclera clear. Conjuctiva pink.  Abdomen:  Bowel sounds present, soft, non-tender, non-distended. No HSM or hernias noted. No rebound or guarding. No masses appreciated  Msk:  Symmetrical without gross deformities. Normal posture. Extremities:  Without clubbing or edema. Neurologic:  Alert and  oriented x4;  grossly normal neurologically. Skin:  Warm and dry, intact without significant lesions.  Cervical Nodes:  No significant cervical adenopathy. Psych:  Alert and cooperative. Normal mood and affect.  Intake/Output from previous day: 12/02 0701 - 12/03 0700 In: 3031.9 [P.O.:600; I.V.:2431.9] Out: -  Intake/Output this shift: Total I/O In: 240 [P.O.:240] Out: -   Lab Results: Recent Labs    06/08/22 0910 06/09/22 0512 06/10/22 0501  WBC 16.3* 11.1* 9.9  HGB 16.3 14.2 12.2*  HCT 47.6 43.5 37.5*  PLT 199 173 144*   BMET Recent Labs    06/08/22 0910 06/09/22 0512 06/10/22 0501  NA 145 145 144  K 3.6 3.5 3.4*  CL 100 107 107  CO2 29 30 30   GLUCOSE 159* 116* 108*  BUN 41* 42* 59*  CREATININE 1.21 1.13 2.17*  CALCIUM 9.7 8.8* 8.5*   LFT Recent Labs    06/08/22 0910 06/10/22 0501  PROT 9.0* 6.3*  ALBUMIN 4.4 3.2*  AST 17 10*  ALT 15 10  ALKPHOS 76 50  BILITOT 0.6 0.8   PT/INR No results for input(s): "LABPROT", "INR" in the last 72 hours. Hepatitis Panel No results for input(s):  "HEPBSAG", "HCVAB", "HEPAIGM", "HEPBIGM" in the last 72 hours.   Studies/Results: CT CHEST ABDOMEN PELVIS W CONTRAST  Result Date: 06/08/2022 CLINICAL DATA:  Vomiting dark red blood. History of esophageal mass. EXAM: CT CHEST, ABDOMEN, AND PELVIS WITH CONTRAST TECHNIQUE: Multidetector CT imaging of the chest, abdomen and pelvis was performed following the standard protocol during bolus administration of intravenous contrast. RADIATION DOSE REDUCTION: This exam was performed according to the departmental dose-optimization program which includes automated exposure control, adjustment of the mA and/or kV according to patient size and/or use of iterative reconstruction technique. CONTRAST:  14/07/2021 OMNIPAQUE IOHEXOL 300 MG/ML  SOLN COMPARISON:  CT abdomen and pelvis 08/17/2021.  CT chest 08/19/2021. FINDINGS: CT CHEST FINDINGS Cardiovascular: No significant vascular findings. Normal heart size. No pericardial effusion. There are atherosclerotic calcifications of the aorta and coronary arteries. Mediastinum/Nodes: There are no enlarged mediastinal or hilar lymph nodes. There is a right thyroid nodule measuring 1 cm. There is a small hiatal hernia, unchanged. There is circumferential wall thickening of the entire esophagus most significant in the mid and lower esophagus. There are small paraesophageal lymph nodes similar to prior. There is mild stranding surrounding the gastroesophageal junction without evidence for fluid collection or pneumomediastinum. Findings are similar to the prior study. Lungs/Pleura: There are scattered atelectatic changes in both lungs. There are left-sided pleural calcifications similar to the prior study. There is no new focal lung infiltrate or pleural  effusion. There is no pneumothorax. Trachea and central airways are patent. Musculoskeletal: No chest wall mass or suspicious bone lesions identified. No acute fractures. There are healed left-sided ninth and tenth rib fractures. CT ABDOMEN  PELVIS FINDINGS Hepatobiliary: No focal liver abnormality is seen. No gallstones, gallbladder wall thickening, or biliary dilatation. Pancreas: Unremarkable. No pancreatic ductal dilatation or surrounding inflammatory changes. Spleen: Normal in size without focal abnormality. Adrenals/Urinary Tract: Subcentimeter cyst in the left kidney is unchanged. There is no hydronephrosis or perinephric stranding. The adrenal glands and bladder are within normal limits. Stomach/Bowel: Small hiatal hernia is present. Otherwise, the stomach is within normal limits. No bowel obstruction, pneumatosis or free air. Appendix within normal limits. There is colonic diverticulosis. There is large stool burden in the rectum. Vascular/Lymphatic: Aortic atherosclerosis. No enlarged abdominal or pelvic lymph nodes. Reproductive: Prostate is unremarkable. Other: No abdominal wall hernia or abnormality. No abdominopelvic ascites. Musculoskeletal: No acute or significant osseous findings. IMPRESSION: 1. Findings compatible with a diffuse esophagitis. 2. Stable findings stranding and small lymph nodes surrounding the gastroesophageal junction with small hiatal hernia. Findings may be related to gastritis and esophagitis. Underlying neoplasm not excluded at this level. No evidence for fluid collection or pneumomediastinum. 3. Colonic diverticulosis.  Large stool burden in the rectum. 4. Subcentimeter left Bosniak I benign renal cyst. No follow-up imaging is recommended. JACR 2018 Feb; 264-273, Management of the Incidental Renal Mass on CT, [employer] 2021; 814-848, Bosniak Classification of Cystic Renal Masses, Version 2019. (Ref: J Am Coll Radiol. 2015 Feb;12(2): 143-50). Aortic Atherosclerosis (ICD10-I70.0). Electronically Signed   By: Darliss Cheney M.D.   On: 06/08/2022 17:44   DG Chest Port 1 View  Result Date: 06/08/2022 CLINICAL DATA:  Hematemesis EXAM: PORTABLE CHEST 1 VIEW COMPARISON:  Chest radiograph 1 day prior FINDINGS: The  cardiomediastinal silhouette is stable, with unchanged cardiomegaly. The upper mediastinal contours are stable with unchanged prominence of the right paratracheal stripe. There is possible retrocardiac opacity suboptimally evaluated due to portable technique and overlying leads/wires. There may be a left pleural effusion. The right lung is clear. There is no right effusion. There is no pneumothorax There is no acute osseous abnormality. IMPRESSION: Possible retrocardiac opacity and left pleural effusion, suboptimally evaluated. Consider PA and lateral chest radiographs for better evaluation if indicated. Electronically Signed   By: Lesia Hausen M.D.   On: 06/08/2022 13:53    Impression: *Nausea and vomiting *Abnormal CT imaging *Chronic GERD with esophagitis *Hiatal hernia   Plan: Patient with extensive upper GI evaluation with 3 EGDs this year alone.  CT imaging appears similar to prior.   Discussed with patient and sister.  Do not think we need to pursue EGD at this time.  Will pursue supportive measures instead.   IV PPI twice daily.  Continue Carafate liquid every 6 hours.   Tolerating full liquid diet.  Advance to heart healthy today.   Antiemetics as needed.   We will see how he does today.  If he has ongoing nausea and vomiting, can consider inpatient upper endoscopy.  Agree with IV fluids for AKI.   Hennie Duos. Marletta Lor, D.O. Gastroenterology and Hepatology [facility]   LOS: 1 day    06/10/2022, 11:27 AM

## 2022-06-10 NOTE — Progress Notes (Signed)
Patient back in bed resting comfortably, no complaints at this time. No N/V. Did well with solid foods at lunch time.

## 2022-06-10 NOTE — Progress Notes (Signed)
PROGRESS NOTE    Warren Sullivan  [id number] DOB: Oct 05, 1960 DOA: 06/08/2022 PCP: Hal Morales, DO   Brief Narrative:  61 y.o. male with medical history significant of history of CVA, dementia, GERD, hyperlipidemia, hypertension, resident of [facility] presented with hematemesis and increased confusion.  On presentation, CT of chest, abdomen and pelvis with contrast showed findings suggestive of diffuse esophagitis along with gastritis; underlying neoplasm could not be excluded.  Hemoglobin 16.3 on presentation.  He was started on IV Protonix.  GI was consulted.  Assessment & Plan:   Probable upper GI bleeding presenting with hematemesis Esophagitis/gastritis -Presented with hematemesis and was found to have imaging findings suggestive of diffuse esophagitis along with gastritis, underlying neoplasm could not be excluded.  Initial hemoglobin was 16.3.  Hemoglobin 12.2 today. -Currently on IV Protonix and oral Carafate. -GI following and recommending conservative management.  Diet advancement as per GI.  -Monitor H&H  Leukocytosis -Possibly reactive.  Resolved  Acute kidney injury -Creatinine has bumped up to 2.17 today.  DC losartan.  Switch IV fluids to normal saline at 100 cc an hour.  Repeat a.m. labs.  Mild hypokalemia -Repeat a.m. labs  Hypertension -Monitor blood pressure.  Continue clonidine, losartan, tamsulosin  Thrombocytopenia -Questionable cause.  Monitor.  BPH -Continue tamsulosin  Dementia -Continue memantine  Hyperlipidemia -Continue statin  Depression -Continue venlafaxine XR  History of unspecified stroke with hemiplegia -Outpatient follow-up  Physical deconditioning -PT recommends SNF placement.  Social worker following.   DVT prophylaxis: SCDs Code Status: Full Family Communication: None at bedside Disposition Plan: Status is: inpatient because: Of H&H monitoring, need for IV fluids.    Consultants: GI  Procedures:  None  Antimicrobials: None   Subjective: Patient seen and examined at bedside.  Awake, poor historian, still slightly confused to time.  No agitation, fever, vomiting reported. Objective: Vitals:   06/09/22 1800 06/09/22 2044 06/10/22 0500 06/10/22 0506  BP: 95/62 97/62  109/71  Pulse: 65 69  62  Resp:  20  20  Temp:  98.3 F (36.8 C)  98.4 F (36.9 C)  TempSrc:  Oral  Oral  SpO2:  95%  (!) 88%  Weight:   101.9 kg   Height:        Intake/Output Summary (Last 24 hours) at 06/10/2022 0820 Last data filed at 06/10/2022 0526 Gross per 24 hour  Intake 3031.89 ml  Output --  Net 3031.89 ml    Filed Weights   06/08/22 0859 06/08/22 2132 06/10/22 0500  Weight: 96.6 kg 96.6 kg 101.9 kg    Examination:  General: On room air.  No distress.  Chronically ill and deconditioned looking. ENT/neck: No thyromegaly.  JVD is not elevated  respiratory: Decreased breath sounds at bases bilaterally with some crackles; no wheezing  CVS: S1-S2 heard, rate controlled currently Abdominal: Soft, nontender, slightly distended; no organomegaly,  bowel sounds are heard Extremities: Trace lower extremity edema; no cyanosis  CNS: Awake but still very slow to respond and slightly confused to time.  Extremely poor historian.  No focal neurologic deficit.  Moves extremities Lymph: No obvious lymphadenopathy Skin: No obvious ecchymosis/lesions  psych: Currently not agitated.  Very flat affect.   Musculoskeletal: No obvious joint swelling/deformity     Data Reviewed: I have personally reviewed following labs and imaging studies  CBC: Recent Labs  Lab 06/07/22 0125 06/08/22 0910 06/09/22 0512 06/10/22 0501  WBC 10.0 16.3* 11.1* 9.9  NEUTROABS 7.3  --   --  5.6  HGB 15.7 16.3  14.2 12.2*  HCT 46.6 47.6 43.5 37.5*  MCV 88.3 88.0 90.8 92.1  PLT 188 199 173 144*    Basic Metabolic Panel: Recent Labs  Lab 06/07/22 0125 06/08/22 0910 06/09/22 0512 06/10/22 0501  NA 141 145 145 144  K 3.9  3.6 3.5 3.4*  CL 99 100 107 107  CO2 30 29 30 30   GLUCOSE 132* 159* 116* 108*  BUN 26* 41* 42* 59*  CREATININE 1.11 1.21 1.13 2.17*  CALCIUM 9.3 9.7 8.8* 8.5*  MG  --   --   --  2.1    GFR: Estimated Creatinine Clearance: 42.3 mL/min (A) (by C-G formula based on SCr of 2.17 mg/dL (H)). Liver Function Tests: Recent Labs  Lab 06/07/22 0125 06/08/22 0910 06/10/22 0501  AST 18 17 10*  ALT 16 15 10   ALKPHOS 82 76 50  BILITOT 0.5 0.6 0.8  PROT 9.1* 9.0* 6.3*  ALBUMIN 4.4 4.4 3.2*    Recent Labs  Lab 06/07/22 0125  LIPASE 30    No results for input(s): "AMMONIA" in the last 168 hours. Coagulation Profile: No results for input(s): "INR", "PROTIME" in the last 168 hours. Cardiac Enzymes: No results for input(s): "CKTOTAL", "CKMB", "CKMBINDEX", "TROPONINI" in the last 168 hours. BNP (last 3 results) No results for input(s): "PROBNP" in the last 8760 hours. HbA1C: No results for input(s): "HGBA1C" in the last 72 hours. CBG: No results for input(s): "GLUCAP" in the last 168 hours. Lipid Profile: No results for input(s): "CHOL", "HDL", "LDLCALC", "TRIG", "CHOLHDL", "LDLDIRECT" in the last 72 hours. Thyroid Function Tests: No results for input(s): "TSH", "T4TOTAL", "FREET4", "T3FREE", "THYROIDAB" in the last 72 hours. Anemia Panel: No results for input(s): "VITAMINB12", "FOLATE", "FERRITIN", "TIBC", "IRON", "RETICCTPCT" in the last 72 hours. Sepsis Labs: No results for input(s): "PROCALCITON", "LATICACIDVEN" in the last 168 hours.  No results found for this or any previous visit (from the past 240 hour(s)).       Radiology Studies: CT CHEST ABDOMEN PELVIS W CONTRAST  Result Date: 06/08/2022 CLINICAL DATA:  Vomiting dark red blood. History of esophageal mass. EXAM: CT CHEST, ABDOMEN, AND PELVIS WITH CONTRAST TECHNIQUE: Multidetector CT imaging of the chest, abdomen and pelvis was performed following the standard protocol during bolus administration of intravenous  contrast. RADIATION DOSE REDUCTION: This exam was performed according to the departmental dose-optimization program which includes automated exposure control, adjustment of the mA and/or kV according to patient size and/or use of iterative reconstruction technique. CONTRAST:  06/09/22 OMNIPAQUE IOHEXOL 300 MG/ML  SOLN COMPARISON:  CT abdomen and pelvis 08/17/2021.  CT chest 08/19/2021. FINDINGS: CT CHEST FINDINGS Cardiovascular: No significant vascular findings. Normal heart size. No pericardial effusion. There are atherosclerotic calcifications of the aorta and coronary arteries. Mediastinum/Nodes: There are no enlarged mediastinal or hilar lymph nodes. There is a right thyroid nodule measuring 1 cm. There is a small hiatal hernia, unchanged. There is circumferential wall thickening of the entire esophagus most significant in the mid and lower esophagus. There are small paraesophageal lymph nodes similar to prior. There is mild stranding surrounding the gastroesophageal junction without evidence for fluid collection or pneumomediastinum. Findings are similar to the prior study. Lungs/Pleura: There are scattered atelectatic changes in both lungs. There are left-sided pleural calcifications similar to the prior study. There is no new focal lung infiltrate or pleural effusion. There is no pneumothorax. Trachea and central airways are patent. Musculoskeletal: No chest wall mass or suspicious bone lesions identified. No acute fractures. There are healed left-sided ninth  and tenth rib fractures. CT ABDOMEN PELVIS FINDINGS Hepatobiliary: No focal liver abnormality is seen. No gallstones, gallbladder wall thickening, or biliary dilatation. Pancreas: Unremarkable. No pancreatic ductal dilatation or surrounding inflammatory changes. Spleen: Normal in size without focal abnormality. Adrenals/Urinary Tract: Subcentimeter cyst in the left kidney is unchanged. There is no hydronephrosis or perinephric stranding. The adrenal glands  and bladder are within normal limits. Stomach/Bowel: Small hiatal hernia is present. Otherwise, the stomach is within normal limits. No bowel obstruction, pneumatosis or free air. Appendix within normal limits. There is colonic diverticulosis. There is large stool burden in the rectum. Vascular/Lymphatic: Aortic atherosclerosis. No enlarged abdominal or pelvic lymph nodes. Reproductive: Prostate is unremarkable. Other: No abdominal wall hernia or abnormality. No abdominopelvic ascites. Musculoskeletal: No acute or significant osseous findings. IMPRESSION: 1. Findings compatible with a diffuse esophagitis. 2. Stable findings stranding and small lymph nodes surrounding the gastroesophageal junction with small hiatal hernia. Findings may be related to gastritis and esophagitis. Underlying neoplasm not excluded at this level. No evidence for fluid collection or pneumomediastinum. 3. Colonic diverticulosis.  Large stool burden in the rectum. 4. Subcentimeter left Bosniak I benign renal cyst. No follow-up imaging is recommended. JACR 2018 Feb; 264-273, Management of the Incidental Renal Mass on CT, [employer] 2021; 814-848, Bosniak Classification of Cystic Renal Masses, Version 2019. (Ref: J Am Coll Radiol. 2015 Feb;12(2): 143-50). Aortic Atherosclerosis (ICD10-I70.0). Electronically Signed   By: Ronney Asters M.D.   On: 06/08/2022 17:44   DG Chest Port 1 View  Result Date: 06/08/2022 CLINICAL DATA:  Hematemesis EXAM: PORTABLE CHEST 1 VIEW COMPARISON:  Chest radiograph 1 day prior FINDINGS: The cardiomediastinal silhouette is stable, with unchanged cardiomegaly. The upper mediastinal contours are stable with unchanged prominence of the right paratracheal stripe. There is possible retrocardiac opacity suboptimally evaluated due to portable technique and overlying leads/wires. There may be a left pleural effusion. The right lung is clear. There is no right effusion. There is no pneumothorax There is no acute osseous  abnormality. IMPRESSION: Possible retrocardiac opacity and left pleural effusion, suboptimally evaluated. Consider PA and lateral chest radiographs for better evaluation if indicated. Electronically Signed   By: Valetta Mole M.D.   On: 06/08/2022 13:53        Scheduled Meds:  atorvastatin  20 mg Oral QHS   cloNIDine  0.2 mg Oral BID   gabapentin  300 mg Oral TID   icosapent Ethyl  2 g Oral BID   losartan  50 mg Oral Daily   memantine  10 mg Oral BID   pantoprazole  40 mg Intravenous Q12H   sucralfate  1 g Oral Q6H   tamsulosin  0.4 mg Oral Daily   venlafaxine XR  75 mg Oral Daily   Continuous Infusions:  lactated ringers 100 mL/hr at 06/10/22 [id number]          Aline August, MD [facility] 06/10/2022, 8:20 AM

## 2022-06-11 ENCOUNTER — Telehealth: Payer: Self-pay | Admitting: Gastroenterology

## 2022-06-11 DIAGNOSIS — F0390 Unspecified dementia without behavioral disturbance: Secondary | ICD-10-CM | POA: Diagnosis not present

## 2022-06-11 DIAGNOSIS — K209 Esophagitis, unspecified without bleeding: Secondary | ICD-10-CM | POA: Diagnosis not present

## 2022-06-11 DIAGNOSIS — K922 Gastrointestinal hemorrhage, unspecified: Secondary | ICD-10-CM | POA: Diagnosis not present

## 2022-06-11 DIAGNOSIS — R1111 Vomiting without nausea: Secondary | ICD-10-CM | POA: Diagnosis not present

## 2022-06-11 LAB — CBC WITH DIFFERENTIAL/PLATELET
Abs Immature Granulocytes: 0.03 10*3/uL (ref 0.00–0.07)
Basophils Absolute: 0.1 10*3/uL (ref 0.0–0.1)
Basophils Relative: 1 %
Eosinophils Absolute: 0.4 10*3/uL (ref 0.0–0.5)
Eosinophils Relative: 4 %
HCT: 37.5 % — ABNORMAL LOW (ref 39.0–52.0)
Hemoglobin: 12.4 g/dL — ABNORMAL LOW (ref 13.0–17.0)
Immature Granulocytes: 0 %
Lymphocytes Relative: 24 %
Lymphs Abs: 2.4 10*3/uL (ref 0.7–4.0)
MCH: 30.2 pg (ref 26.0–34.0)
MCHC: 33.1 g/dL (ref 30.0–36.0)
MCV: 91.2 fL (ref 80.0–100.0)
Monocytes Absolute: 0.9 10*3/uL (ref 0.1–1.0)
Monocytes Relative: 9 %
Neutro Abs: 6 10*3/uL (ref 1.7–7.7)
Neutrophils Relative %: 62 %
Platelets: 141 10*3/uL — ABNORMAL LOW (ref 150–400)
RBC: 4.11 MIL/uL — ABNORMAL LOW (ref 4.22–5.81)
RDW: 13 % (ref 11.5–15.5)
WBC: 9.7 10*3/uL (ref 4.0–10.5)
nRBC: 0 % (ref 0.0–0.2)

## 2022-06-11 LAB — BASIC METABOLIC PANEL
Anion gap: 7 (ref 5–15)
BUN: 39 mg/dL — ABNORMAL HIGH (ref 8–23)
CO2: 29 mmol/L (ref 22–32)
Calcium: 8.3 mg/dL — ABNORMAL LOW (ref 8.9–10.3)
Chloride: 108 mmol/L (ref 98–111)
Creatinine, Ser: 1.04 mg/dL (ref 0.61–1.24)
GFR, Estimated: >60 mL/min (ref >60)
Glucose, Bld: 95 mg/dL (ref 70–99)
Potassium: 3.4 mmol/L — ABNORMAL LOW (ref 3.5–5.1)
Sodium: 144 mmol/L (ref 135–145)

## 2022-06-11 LAB — MAGNESIUM: Magnesium: 1.9 mg/dL (ref 1.7–2.4)

## 2022-06-11 MED ORDER — SUCRALFATE 1 GM/10ML PO SUSP: 1.0000 g | Freq: Four times a day (QID) | ORAL | 1 refills | Status: DC

## 2022-06-11 MED ORDER — ATORVASTATIN CALCIUM 20 MG PO TABS: 20.0000 mg | ORAL_TABLET | Freq: Every day | ORAL | Status: AC

## 2022-06-11 NOTE — Discharge Summary (Addendum)
Physician Discharge Summary  Warren Sullivan WNU:[id number] DOB: [birth year] DOA: 06/08/2022  PCP: Sherol Dade, DO  Admit date: 06/08/2022 Discharge date: 06/11/2022  Admitted From: SNF Disposition: SNF  Recommendations for Outpatient Follow-up:  Follow up with SNF provider at earliest convenience with repeat CBC/BMP in the next few days Outpatient follow-up with GI Recommend outpatient follow-up by palliative care for goals of care discussion  follow up in ED if symptoms worsen or new appear   Home Health: No Equipment/Devices: None  Discharge Condition: Guarded  CODE STATUS: Full Diet recommendation: Heart healthy  Brief/Interim Summary: 61 y.o. male with medical history significant of history of CVA, dementia, GERD, hyperlipidemia, hypertension, [facility] presented with hematemesis and increased confusion.  On presentation, CT of chest, abdomen and pelvis with contrast showed findings suggestive of diffuse esophagitis along with gastritis; underlying neoplasm could not be excluded.  Hemoglobin 16.3 on presentation.  He was started on IV Protonix.  GI was consulted.  He was managed conservatively.  During the hospitalization, he has not had any more episodes of hematemesis.  GI has advanced the diet and he is tolerating it.  His hemoglobin is stable.  GI has cleared the patient for discharge.  Discharge patient to SNF today.  Discharge Diagnoses:   Probable upper GI bleeding presenting with hematemesis Esophagitis/gastritis -Presented with hematemesis and was found to have imaging findings suggestive of diffuse esophagitis along with gastritis, underlying neoplasm could not be excluded.  Initial hemoglobin was 16.3.  Hemoglobin 12.4 today. -Currently on IV Protonix and oral Carafate. -GI following and recommending conservative management.  Diet has been advanced to solid diet by GI and patient is tolerating it.  No further hematemesis. -GI has cleared the  patient for discharge.  Discharge patient to SNF today.  GI recommended PPI twice daily on discharge along with Carafate for 2 weeks.  Outpatient follow-up with GI.  Outpatient follow-up of CBC.  Leukocytosis -Possibly reactive.  Resolved   Acute kidney injury -Creatinine bumped up to 2.17 on 06/10/2022.  DC'd losartan.  Treated with IV fluids.  Creatinine 1.04 today.  Outpatient follow-up of BMP.  Keep losartan on hold.  Mild hypokalemia -Outpatient follow-up.   Hypertension -Monitor blood pressure.  Continue clonidine, tamsulosin.  Losartan to remain on hold.   Thrombocytopenia -Questionable cause.  Outpatient follow-up.  BPH -Continue tamsulosin   Dementia -Continue memantine -Palliative care consultation as an outpatient for goals of care discussion.   Hyperlipidemia -Continue statin   Depression -Continue venlafaxine XR   History of unspecified stroke with hemiplegia -Outpatient follow-up   Physical deconditioning -PT recommends SNF placement.     Discharge Instructions   Allergies as of 06/11/2022   No Known Allergies      Medication List     STOP taking these medications    aspirin EC 81 MG tablet   losartan 25 MG tablet Commonly known as: COZAAR   losartan 50 MG tablet Commonly known as: COZAAR       TAKE these medications    acetaminophen 650 MG CR tablet Commonly known as: TYLENOL Take 1,300 mg by mouth every 8 (eight) hours as needed (general discomfort).   albuterol (2.5 MG/3ML) 0.083% nebulizer solution Commonly known as: PROVENTIL Take 3 mLs (2.5 mg total) by nebulization every 4 (four) hours as needed for wheezing or shortness of breath.   atorvastatin 20 MG tablet Commonly known as: LIPITOR Take 1 tablet (20 mg total) by mouth at bedtime. What changed:  when to take  this Another medication with the same name was removed. Continue taking this medication, and follow the directions you see here.   CALCIUM 500 PO Take 1 tablet by  mouth daily.   Cholecalciferol 25 MCG (1000 UT) capsule Take 5,000 Units by mouth daily.   cloNIDine 0.1 MG tablet Commonly known as: CATAPRES Take 2 tablets (0.2 mg total) by mouth 2 (two) times daily.   gabapentin 300 MG capsule Commonly known as: NEURONTIN Take 1 capsule (300 mg total) by mouth 3 (three) times daily. Start with one at bedtime for 1 week. Then two at bedtime for one week, then go to full dose.   memantine 10 MG tablet Commonly known as: NAMENDA Take 10 mg by mouth 2 (two) times daily.   multivitamin tablet Take 1 tablet by mouth daily.   ondansetron 4 MG tablet Commonly known as: ZOFRAN Take 4 mg by mouth every 8 (eight) hours as needed for nausea or vomiting.   pantoprazole 40 MG tablet Commonly known as: Protonix Take 1 tablet (40 mg total) by mouth 2 (two) times daily.   sucralfate 1 GM/10ML suspension Commonly known as: CARAFATE Take 10 mLs (1 g total) by mouth every 6 (six) hours. For 2 weeks as per GI recommendations   tamsulosin 0.4 MG Caps capsule Commonly known as: FLOMAX Take 0.4 mg by mouth daily.   Vascepa 1 g capsule Generic drug: icosapent Ethyl Take 2 g by mouth 2 (two) times daily.   Venlafaxine HCl 75 MG Tb24 Take 75 mg by mouth daily.   vitamin B-12 500 MCG tablet Commonly known as: CYANOCOBALAMIN Take 500 mcg by mouth daily.          Follow-up Information     Simpson-Tarokh, Leann, DO. Schedule an appointment as soon as possible for a visit in 1 week(s).   Specialty: Family Medicine Why: With repeat CBC/BMP Contact information: [address] [place] [id number] [phone]                No Known Allergies  Consultations: GI   Procedures/Studies: CT CHEST ABDOMEN PELVIS W CONTRAST  Result Date: 06/08/2022 CLINICAL DATA:  Vomiting dark red blood. History of esophageal mass. EXAM: CT CHEST, ABDOMEN, AND PELVIS WITH CONTRAST TECHNIQUE: Multidetector CT imaging of the chest, abdomen and pelvis was  performed following the standard protocol during bolus administration of intravenous contrast. RADIATION DOSE REDUCTION: This exam was performed according to the departmental dose-optimization program which includes automated exposure control, adjustment of the mA and/or kV according to patient size and/or use of iterative reconstruction technique. CONTRAST:  100mL OMNIPAQUE IOHEXOL 300 MG/ML  SOLN COMPARISON:  CT abdomen and pelvis 08/17/2021.  CT chest 08/19/2021. FINDINGS: CT CHEST FINDINGS Cardiovascular: No significant vascular findings. Normal heart size. No pericardial effusion. There are atherosclerotic calcifications of the aorta and coronary arteries. Mediastinum/Nodes: There are no enlarged mediastinal or hilar lymph nodes. There is a right thyroid nodule measuring 1 cm. There is a small hiatal hernia, unchanged. There is circumferential wall thickening of the entire esophagus most significant in the mid and lower esophagus. There are small paraesophageal lymph nodes similar to prior. There is mild stranding surrounding the gastroesophageal junction without evidence for fluid collection or pneumomediastinum. Findings are similar to the prior study. Lungs/Pleura: There are scattered atelectatic changes in both lungs. There are left-sided pleural calcifications similar to the prior study. There is no new focal lung infiltrate or pleural effusion. There is no pneumothorax. Trachea and central airways are patent. Musculoskeletal:  No chest wall mass or suspicious bone lesions identified. No acute fractures. There are healed left-sided ninth and tenth rib fractures. CT ABDOMEN PELVIS FINDINGS Hepatobiliary: No focal liver abnormality is seen. No gallstones, gallbladder wall thickening, or biliary dilatation. Pancreas: Unremarkable. No pancreatic ductal dilatation or surrounding inflammatory changes. Spleen: Normal in size without focal abnormality. Adrenals/Urinary Tract: Subcentimeter cyst in the left kidney  is unchanged. There is no hydronephrosis or perinephric stranding. The adrenal glands and bladder are within normal limits. Stomach/Bowel: Small hiatal hernia is present. Otherwise, the stomach is within normal limits. No bowel obstruction, pneumatosis or free air. Appendix within normal limits. There is colonic diverticulosis. There is large stool burden in the rectum. Vascular/Lymphatic: Aortic atherosclerosis. No enlarged abdominal or pelvic lymph nodes. Reproductive: Prostate is unremarkable. Other: No abdominal wall hernia or abnormality. No abdominopelvic ascites. Musculoskeletal: No acute or significant osseous findings. IMPRESSION: 1. Findings compatible with a diffuse esophagitis. 2. Stable findings stranding and small lymph nodes surrounding the gastroesophageal junction with small hiatal hernia. Findings may be related to gastritis and esophagitis. Underlying neoplasm not excluded at this level. No evidence for fluid collection or pneumomediastinum. 3. Colonic diverticulosis.  Large stool burden in the rectum. 4. Subcentimeter left Bosniak I benign renal cyst. No follow-up imaging is recommended. JACR 2018 Feb; 264-273, Management of the Incidental Renal Mass on CT, Radio[employer] 2021; 814-848, Bosniak Classification of Cystic Renal Masses, Version 2019. (Ref: J Am Coll Radiol. 2015 Feb;12(2): 143-50). Aortic Atherosclerosis (ICD10-I70.0). Electronically Signed   By: Darliss Cheney M.D.   On: 06/08/2022 17:44   DG Chest Port 1 View  Result Date: 06/08/2022 CLINICAL DATA:  Hematemesis EXAM: PORTABLE CHEST 1 VIEW COMPARISON:  Chest radiograph 1 day prior FINDINGS: The cardiomediastinal silhouette is stable, with unchanged cardiomegaly. The upper mediastinal contours are stable with unchanged prominence of the right paratracheal stripe. There is possible retrocardiac opacity suboptimally evaluated due to portable technique and overlying leads/wires. There may be a left pleural effusion. The right lung is  clear. There is no right effusion. There is no pneumothorax There is no acute osseous abnormality. IMPRESSION: Possible retrocardiac opacity and left pleural effusion, suboptimally evaluated. Consider PA and lateral chest radiographs for better evaluation if indicated. Electronically Signed   By: Lesia Hausen M.D.   On: 06/08/2022 13:53   DG Chest Port 1 View  Result Date: 06/07/2022 CLINICAL DATA:  Cough EXAM: PORTABLE CHEST 1 VIEW COMPARISON:  01/29/2022 FINDINGS: Cardiac shadow is enlarged but stable. Aortic calcifications are seen. The lungs are well aerated bilaterally. No focal infiltrate or effusion is seen. No bony abnormality is noted. IMPRESSION: No acute abnormality noted. Electronically Signed   By: Alcide Clever M.D.   On: 06/07/2022 03:49      Subjective: Patient seen and examined at bedside.  Poor historian.  Nursing staff reports no hematemesis.  Patient has tolerated solid food.  No fever or chest pain reported.  Discharge Exam: Vitals:   06/10/22 2039 06/11/22 0500  BP: (!) 136/96 (!) 163/88  Pulse: 61 91  Resp: 20 18  Temp: 98.6 F (37 C) 98.4 F (36.9 C)  SpO2: 93% 93%    General: Pt is alert, awake, not in acute distress.  Looks chronically and deconditioned.  Currently on room air.  Awake but slightly confused. Cardiovascular: rate controlled, S1/S2 + Respiratory: bilateral decreased breath sounds at bases with some scattered crackles Abdominal: Soft, NT, distended, bowel sounds + Extremities: Trace lower extremity edema; no cyanosis    The results  of significant diagnostics from this hospitalization (including imaging, microbiology, ancillary and laboratory) are listed below for reference.     Microbiology: No results found for this or any previous visit (from the past 240 hour(s)).   Labs: BNP (last 3 results) No results for input(s): "BNP" in the last 8760 hours. Basic Metabolic Panel: Recent Labs  Lab 06/07/22 0125 06/08/22 0910 06/09/22 0512  06/10/22 0501 06/11/22 0431  NA 141 145 145 144 144  K 3.9 3.6 3.5 3.4* 3.4*  CL 99 100 107 107 108  CO2 30 29 30 30 29   GLUCOSE 132* 159* 116* 108* 95  BUN 26* 41* 42* 59* 39*  CREATININE 1.11 1.21 1.13 2.17* 1.04  CALCIUM 9.3 9.7 8.8* 8.5* 8.3*  MG  --   --   --  2.1 1.9   Liver Function Tests: Recent Labs  Lab 06/07/22 0125 06/08/22 0910 06/10/22 0501  AST 18 17 10*  ALT 16 15 10   ALKPHOS 82 76 50  BILITOT 0.5 0.6 0.8  PROT 9.1* 9.0* 6.3*  ALBUMIN 4.4 4.4 3.2*   Recent Labs  Lab 06/07/22 0125  LIPASE 30   No results for input(s): "AMMONIA" in the last 168 hours. CBC: Recent Labs  Lab 06/07/22 0125 06/08/22 0910 06/09/22 0512 06/10/22 0501 06/11/22 0431  WBC 10.0 16.3* 11.1* 9.9 9.7  NEUTROABS 7.3  --   --  5.6 6.0  HGB 15.7 16.3 14.2 12.2* 12.4*  HCT 46.6 47.6 43.5 37.5* 37.5*  MCV 88.3 88.0 90.8 92.1 91.2  PLT 188 199 173 144* 141*   Cardiac Enzymes: No results for input(s): "CKTOTAL", "CKMB", "CKMBINDEX", "TROPONINI" in the last 168 hours. BNP: Invalid input(s): "POCBNP" CBG: No results for input(s): "GLUCAP" in the last 168 hours. D-Dimer No results for input(s): "DDIMER" in the last 72 hours. Hgb A1c No results for input(s): "HGBA1C" in the last 72 hours. Lipid Profile No results for input(s): "CHOL", "HDL", "LDLCALC", "TRIG", "CHOLHDL", "LDLDIRECT" in the last 72 hours. Thyroid function studies No results for input(s): "TSH", "T4TOTAL", "T3FREE", "THYROIDAB" in the last 72 hours.  Invalid input(s): "FREET3" Anemia work up No results for input(s): "VITAMINB12", "FOLATE", "FERRITIN", "TIBC", "IRON", "RETICCTPCT" in the last 72 hours. Urinalysis    Component Value Date/Time   COLORURINE YELLOW 06/08/2022 1742   APPEARANCEUR CLEAR 06/08/2022 1742   LABSPEC 1.026 06/08/2022 1742   PHURINE 6.0 06/08/2022 1742   GLUCOSEU NEGATIVE 06/08/2022 1742   HGBUR SMALL (A) 06/08/2022 1742   BILIRUBINUR NEGATIVE 06/08/2022 1742   KETONESUR 5 (A)  06/08/2022 1742   PROTEINUR 100 (A) 06/08/2022 1742   UROBILINOGEN 0.2 02/19/2013 1857   NITRITE NEGATIVE 06/08/2022 1742   LEUKOCYTESUR NEGATIVE 06/08/2022 1742   Sepsis Labs Recent Labs  Lab 06/08/22 0910 06/09/22 0512 06/10/22 0501 06/11/22 0431  WBC 16.3* 11.1* 9.9 9.7   Microbiology No results found for this or any previous visit (from the past 240 hour(s)).   Time coordinating discharge: 35 minutes  SIGNED:   14/03/23, MD  [facility] 06/11/2022, 8:15 AM

## 2022-06-11 NOTE — Telephone Encounter (Signed)
Mandy/Mitzie:   Please arrange hospital follow-up. [facility] GI is [facility]. Any APP can see. Thanks!

## 2022-06-11 NOTE — [place] FL2 (Signed)
[place] MEDICAID FL2 LEVEL OF CARE FORM     IDENTIFICATION  Patient Name: Warren Sullivan Birthdate: Mar 12, 1961 Sex: male Admission Date (Current Location): 06/08/2022  [facility] and [place] Number:  [employer] and Address:  [facility],  [address] [place] [address]      Provider Number: [id number]  Attending Physician Name and Address:  Glade Lloyd, MD  Relative Name and Phone Number:       Current Level of Care: Hospital Recommended Level of Care: Skilled Nursing Facility Prior Approval Number:    Date Approved/Denied:   PASRR Number:    Discharge Plan: SNF    Current Diagnoses: Patient Active Problem List   Diagnosis Date Noted   Esophagitis 06/09/2022   Hiatal hernia 06/09/2022   Abnormal CT of the chest 06/09/2022   GI bleed 06/09/2022   Stroke (HCC) 06/08/2022   Hemiplegia, unspecified affecting right dominant side (HCC) 06/08/2022   Emesis, persistent 01/29/2022   Helicobacter pylori duodenitis 11/06/2021   Fall at home, initial encounter 08/29/2021   Pneumonia 08/28/2021   Hypokalemia 08/18/2021   Hyponatremia 08/18/2021   AKI (acute kidney injury) (HCC) 08/18/2021   Hypocalcemia 08/18/2021   Hyperglycemia 08/18/2021   Vomiting 08/18/2021   GERD (gastroesophageal reflux disease) 08/18/2021   Dehydration 08/18/2021   Hyperlipidemia, unspecified 08/18/2021   Dementia without behavioral disturbance (HCC) 08/18/2021   Small bowel obstruction (HCC) 08/17/2021   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 10/19/2015   Tobacco dependence 06/21/2015   Essential hypertension 06/14/2015    Orientation RESPIRATION BLADDER Height & Weight     Self, Place  Normal Continent Weight: 224 lb 10.4 oz (101.9 kg) Height:  5\' 11"  (180.3 cm)  BEHAVIORAL SYMPTOMS/MOOD NEUROLOGICAL BOWEL NUTRITION STATUS      Continent Diet (see dc summary)  AMBULATORY STATUS COMMUNICATION OF NEEDS Skin   Extensive Assist Verbally Normal                        Personal Care Assistance Level of Assistance  Bathing, Feeding, Dressing Bathing Assistance: Maximum assistance Feeding assistance: Independent Dressing Assistance: Maximum assistance     Functional Limitations Info  Sight, Hearing, Speech Sight Info: Adequate Hearing Info: Adequate Speech Info: Adequate    SPECIAL CARE FACTORS FREQUENCY                       Contractures Contractures Info: Not present    Additional Factors Info  Code Status, Allergies Code Status Info: Full Allergies Info: NKA           Current Medications (06/11/2022):  This is the current hospital active medication list Current Facility-Administered Medications  Medication Dose Route Frequency Provider Last Rate Last Admin   atorvastatin (LIPITOR) tablet 20 mg  20 mg Oral QHS 14/10/2021, DO   20 mg at 06/11/22 0004   gabapentin (NEURONTIN) capsule 300 mg  300 mg Oral TID 14/04/23, DO   300 mg at 06/11/22 14/04/23   hydrALAZINE (APRESOLINE) injection 10 mg  10 mg Intravenous Q6H PRN Adefeso, Oladapo, DO       icosapent Ethyl (VASCEPA) 1 g capsule 2 g  2 g Oral BID 7619, DO   2 g at 06/11/22 0003   memantine (NAMENDA) tablet 10 mg  10 mg Oral BID 14/04/23, DO   10 mg at 06/11/22 0848   ondansetron (ZOFRAN) injection 4 mg  4  mg Intravenous Q6H PRN Levie Heritage, DO   4 mg at 06/08/22 2315   pantoprazole (PROTONIX) injection 40 mg  40 mg Intravenous Q12H Levie Heritage, DO   40 mg at 06/11/22 0847   sucralfate (CARAFATE) 1 GM/10ML suspension 1 g  1 g Oral Q6H Carver, Charles K, DO   1 g at 06/11/22 9480   tamsulosin (FLOMAX) capsule 0.4 mg  0.4 mg Oral QPC supper Glade Lloyd, MD   0.4 mg at 06/10/22 1649   venlafaxine XR (EFFEXOR-XR) 24 hr capsule 75 mg  75 mg Oral Daily Levie Heritage, DO   75 mg at 06/11/22 1655     Discharge Medications: Please see discharge summary for a list of discharge medications.  Relevant Imaging  Results:  Relevant Lab Results:   Additional Information Please call [facility] Outpatient palliative to refer  Elliot Gault, LCSW

## 2022-06-11 NOTE — Progress Notes (Signed)
Report called to [place] at [facility].

## 2022-06-11 NOTE — Progress Notes (Signed)
[facility]) Hospital Liaison note:  Notified via workque of request for [facility] services. Will continue to follow for disposition.  Please call with any outpatient palliative questions or concerns.  Thank you for the opportunity to participate in this patient's care.  Thank you, Abran Cantor, LPN [facility] Liaison [phone]

## 2022-06-11 NOTE — Care Management Important Message (Signed)
Important Message  Patient Details  Name: Warren Sullivan MRN: [id number] Date of Birth: Nov 11, 1960   Medicare Important Message Given:  N/A - LOS <3 / Initial given by admissions     Corey Harold 06/11/2022, 11:29 AM

## 2022-06-11 NOTE — TOC Transition Note (Signed)
Transition of Care [facility]) - CM/SW Discharge Note   Patient Details  Name: Warren Sullivan MRN: [id number] Date of Birth: [birth year]  [facility]) CM/SW Contact:  Elliot Gault, LCSW Phone Number: 06/11/2022, 11:13 AM   Clinical Narrative:     Pt stable for dc per MD. Updated pt's sister. Updated Debbie at [facility]. DC clinical sent electronically. RN to call report. EMS arranged.  Updated pt's FL2 to indicate [facility]iative for outpatient follow up.  There are no other TOC needs for dc.  Final next level of care: Skilled Nursing Facility Barriers to Discharge: Barriers Resolved   Patient Goals and CMS Choice Patient states their goals for this hospitalization and ongoing recovery are:: return to facility CMS Medicare.gov Compare Post Acute Care list provided to:: Patient Choice offered to / list presented to : Patient  Discharge Placement                       Discharge Plan and Services In-house Referral: Clinical Social Work Discharge Planning Services: CM Consult Post Acute Care Choice: Nursing Home                               Social Determinants of Health (SDOH) Interventions     Readmission Risk Interventions     No data to display

## 2022-06-13 NOTE — Progress Notes (Unsigned)
GI Office Note    Referring Provider: Hal Morales, * Primary Care Physician:  Hal Morales, DO Primary Gastroenterologist: Harvel Quale, MD  Date:  06/14/2022  ID:  Warren Sullivan, DOB October 21, 1960, MRN [id number]   Chief Complaint   Chief Complaint  Patient presents with   ER follow up    Pt was seen in ER at [facility] on 06/08/22 for vomiting blood. He is here for a follow up today. No vomiting since being seen in ER.    History of Present Illness  Warren Sullivan is a 61 y.o. male with a history of dementia, CVA, hemiaplasia, dyslipidemia, depression, GERD with esophagitis, and hiatal hernia presenting today for hospital follow-up.  Patient well-known to others on the GI service given he has been seen previously for similar complaints and abnormal CT scans.  Patient recently seen in the hospital 06/09/2022 for intractable nausea and vomiting, suspected bloody emesis.  Patient noted to be a poor historian, sister provided majority of HPI.  Patient had had nausea vomiting for 2 days and unable to eat or drink despite good appetite.  Clear nonbloody emesis in the ED.  CT chest, abdomen, pelvis with diffuse esophagitis, hiatal hernia, stable small lymph nodes surrounding GE junction.  Given extensive endoscopic workup this year and similar CT findings to prior, he was treated supportively with IV PPI twice daily as well as Carafate every 6 hours.  IV fluids provided for AKI.  With PPI and Carafate, patient was able to tolerate diet and was discharged 06/11/2022.  Prior endoscopic workup: EGD February 2023: -Esophagitis with ulceration -Peptic duodenitis -Active chronic gastritis with H. Pylori  (treated with bismuth quadruple therapy)  EGD March 2023 -Erythematous duodenopathy -Mild carditis -Chronic gastritis with H. pylori s/p treatment with levofloxacin salvage therapy  EGD July 2023 -Hiatal hernia -Submucosal nodule lower third of  esophagus -Normal stomach -Normal duodenum -Gastric biopsies with intestinal metaplasia, negative for H. Pylori   Today: No more vomiting since left the hospital. Denies any nausea, abdominal pain. Has a good appetite, eats all his food. Had a BM yesterday. Unsure about any blood present. Weight has been stable. Denies heartburn, dysphagia. No constipation or diarrhea. No odynophagia.    Current Outpatient Medications  Medication Sig Dispense Refill   acetaminophen (TYLENOL) 650 MG CR tablet Take 1,300 mg by mouth every 8 (eight) hours as needed (general discomfort).     albuterol (PROVENTIL) (2.5 MG/3ML) 0.083% nebulizer solution Take 3 mLs (2.5 mg total) by nebulization every 4 (four) hours as needed for wheezing or shortness of breath. 75 mL 12   aspirin EC 81 MG tablet Take 81 mg by mouth daily. Swallow whole.     atorvastatin (LIPITOR) 20 MG tablet Take 1 tablet (20 mg total) by mouth at bedtime.     Calcium Carbonate (CALCIUM 500 PO) Take 1 tablet by mouth daily.     Cholecalciferol 25 MCG (1000 UT) capsule Take 5,000 Units by mouth daily.     cloNIDine (CATAPRES) 0.1 MG tablet Take 2 tablets (0.2 mg total) by mouth 2 (two) times daily. 60 tablet 3   gabapentin (NEURONTIN) 300 MG capsule Take 1 capsule (300 mg total) by mouth 3 (three) times daily. Start with one at bedtime for 1 week. Then two at bedtime for one week, then go to full dose. 90 capsule 5   memantine (NAMENDA) 10 MG tablet Take 10 mg by mouth 2 (two) times daily.     Multiple Vitamin (MULTIVITAMIN) tablet  Take 1 tablet by mouth daily.     ondansetron (ZOFRAN) 4 MG tablet Take 4 mg by mouth every 8 (eight) hours as needed for nausea or vomiting.     pantoprazole (PROTONIX) 40 MG tablet Take 1 tablet (40 mg total) by mouth 2 (two) times daily. 60 tablet 3   sucralfate (CARAFATE) 1 GM/10ML suspension Take 10 mLs (1 g total) by mouth every 6 (six) hours. For 2 weeks as per GI recommendations 420 mL 1   tamsulosin (FLOMAX)  0.4 MG CAPS capsule Take 0.4 mg by mouth daily.     VASCEPA 1 g capsule Take 2 g by mouth 2 (two) times daily.     Venlafaxine HCl 75 MG TB24 Take 75 mg by mouth daily.     vitamin B-12 (CYANOCOBALAMIN) 500 MCG tablet Take 500 mcg by mouth daily.     No current facility-administered medications for this visit.    Past Medical History:  Diagnosis Date   CVA (cerebral vascular accident) (HCC)    Dementia in other diseases classified elsewhere, severe, without behavioral disturbance, psychotic disturbance, mood disturbance, and anxiety (HCC)    GERD (gastroesophageal reflux disease)    Hemiplegia, unspecified affecting right dominant side (HCC)    Hyperlipidemia    Hypertension    MDD (major depressive disorder)    Stroke (HCC)    Vitamin D deficiency     Past Surgical History:  Procedure Laterality Date   BIOPSY  08/18/2021   Procedure: BIOPSY;  Surgeon: Dolores Frame, MD;  Location: [facility];  Service: Gastroenterology;;  Bonnielee Haff and distal esophagus biopsies    BIOPSY  10/03/2021   Procedure: BIOPSY;  Surgeon: Dolores Frame, MD;  Location: [facility];  Service: Gastroenterology;;   BIOPSY  01/30/2022   Procedure: BIOPSY;  Surgeon: Dolores Frame, MD;  Location: [facility];  Service: Gastroenterology;;   ESOPHAGOGASTRODUODENOSCOPY (EGD) WITH PROPOFOL N/A 08/18/2021   Surgeon: Marguerita Merles, Reuel Boom, MD; severe acute nonspecific esophagitis with ulceration and reactive hyperplasia, peptic duodenitis, acute chronic gastritis with H. pylori, 3 cm hiatal hernia.   ESOPHAGOGASTRODUODENOSCOPY (EGD) WITH PROPOFOL N/A 10/03/2021   Surgeon: Marguerita Merles, Reuel Boom, MD; myocarditis, chronic gastritis with H. pylori, 2 cm hiatal hernia, erythematous duodenopathy.   ESOPHAGOGASTRODUODENOSCOPY (EGD) WITH PROPOFOL N/A 01/30/2022   Procedure: ESOPHAGOGASTRODUODENOSCOPY (EGD) WITH PROPOFOL;  Surgeon: Dolores Frame, MD;  Location:  [facility];  Service: Gastroenterology;  Laterality: N/A;   HERNIA REPAIR      Family History  Problem Relation Age of Onset   Hypertension Mother    Stroke Mother    Hypertension Father    Hypertension Sister     Allergies as of 06/14/2022   (No Known Allergies)    Social History   Socioeconomic History   Marital status: Divorced    Spouse name: Not on file   Number of children: Not on file   Years of education: Not on file   Highest education level: Not on file  Occupational History   Not on file  Tobacco Use   Smoking status: Every Day    Packs/day: 0.25    Types: Cigarettes   Smokeless tobacco: Not on file  Vaping Use   Vaping Use: Never used  Substance and Sexual Activity   Alcohol use: No   Drug use: No   Sexual activity: Not on file  Other Topics Concern   Not on file  Social History Narrative   Not on file   Social  Determinants of Health   Financial Resource Strain: Not on file  Food Insecurity: No Food Insecurity (06/09/2022)   Hunger Vital Sign    Worried About Running Out of Food in the Last Year: Never true    Ran Out of Food in the Last Year: Never true  Transportation Needs: No Transportation Needs (06/09/2022)   PRAPARE - Hydrologist (Medical): No    Lack of Transportation (Non-Medical): No  Physical Activity: Not on file  Stress: Not on file  Social Connections: Not on file     Review of Systems   Gen: Denies fever, chills, anorexia. Denies fatigue, weakness, weight loss.  CV: Denies chest pain, palpitations, syncope, peripheral edema, and claudication. Resp: Denies dyspnea at rest, cough, wheezing, coughing up blood, and pleurisy. GI: See HPI Derm: Denies rash, itching, dry skin Psych: Denies depression, anxiety, memory loss, confusion. No homicidal or suicidal ideation.  Heme: Denies bruising, bleeding, and enlarged lymph nodes.   Physical Exam   BP 130/80 (BP Location: Right Arm, Patient Position:  Sitting, Cuff Size: Large)   Temp (!) 97.1 F (36.2 C) (Oral)   Ht 5\' 11"  (1.803 m)   Wt 212 lb 6.4 oz (96.3 kg)   SpO2 96%   BMI 29.62 kg/m   General:   Alert No distress noted. Pleasant and cooperative. In wheelchair Head:  Normocephalic and atraumatic. Eyes:  Conjuctiva clear without scleral icterus. Lungs:  Clear to auscultation bilaterally. No wheezes, rales, or rhonchi. No distress.  Heart:  S1, S2 present without murmurs appreciated.  Abdomen:  +BS, soft, non-tender and non-distended. No rebound or guarding. No HSM or masses noted. Rectal: deferred Msk:  in wheelchair Extremities:  Without edema. Psych:  Alert and cooperative. Normal mood and affect. Answers questions appropriately.    Assessment  Warren Sullivan is a 61 y.o. male with a history of dementia, CVA, hemiaplasia, dyslipidemia, depression, GERD with esophagitis, and hiatal hernia presenting today for hospital follow-up.  Patient well-known to others on the GI service given he has been seen previously for similar complaints and abnormal CT scans.  GERD with esophagitis, hiatal hernia, previous H. pylori: Patient underwent multiple EGDs this year as outlined in HPI.  Has a small hiatal hernia present and multiple tissue biopsies revealing H. pylori gastritis.  Originally treated with bismuth quadruple therapy, repeat EGD with ongoing H. pylori and was treated with levofloxacin salvage therapy.  Follow-up EGD in July with biopsies negative for H. pylori.  Recent hospitalization 06/09/2022 for nausea and vomiting with CT evidence of esophagitis.  Treated with IV PPI twice daily and started on Carafate every 6 hours.  Hemoglobin stable on discharge in the 12 range.  Would not proceed with any further endoscopic workup at this time.  Currently doing well since hospitalization, denies lack of appetite, nausea, or any recurrent vomiting.  Patient unsure about any melena or BRBPR.  Denies any abdominal pain.  For now we will  continue pantoprazole 40 mg twice daily and Carafate 1 g 4 times daily for 2 weeks.  May use Zofran for any reoccurring nausea or vomiting.  PLAN   Continue pantoprazole 40 mg twice daily. Continue Carafate 1 g 4 times daily for 2 weeks. Continue Zofran every 8 hours as needed Notify the office if any recurrent nausea, vomiting, or hematemesis. Follow GERD diet. Follow up in 3-4 months.    Venetia Night, MSN, FNP-BC, AGACNP-BC [facility]  I have reviewed the note and agree  with the APP's assessment as described in this progress note  [name], MD Gastroenterology and Hepatology [facility]

## 2022-06-14 ENCOUNTER — Ambulatory Visit (INDEPENDENT_AMBULATORY_CARE_PROVIDER_SITE_OTHER): Payer: Medicare Other | Admitting: Gastroenterology

## 2022-06-14 ENCOUNTER — Encounter: Payer: Self-pay | Admitting: Gastroenterology

## 2022-06-14 VITALS — BP 130/80 | Temp 97.1°F | Ht 71.0 in | Wt 212.4 lb

## 2022-06-14 DIAGNOSIS — K219 Gastro-esophageal reflux disease without esophagitis: Secondary | ICD-10-CM

## 2022-06-14 DIAGNOSIS — K449 Diaphragmatic hernia without obstruction or gangrene: Secondary | ICD-10-CM | POA: Diagnosis not present

## 2022-06-14 DIAGNOSIS — R112 Nausea with vomiting, unspecified: Secondary | ICD-10-CM

## 2022-06-14 DIAGNOSIS — B9681 Helicobacter pylori [H. pylori] as the cause of diseases classified elsewhere: Secondary | ICD-10-CM

## 2022-06-14 DIAGNOSIS — K2970 Gastritis, unspecified, without bleeding: Secondary | ICD-10-CM | POA: Diagnosis not present

## 2022-06-14 DIAGNOSIS — K2980 Duodenitis without bleeding: Secondary | ICD-10-CM

## 2022-06-14 NOTE — Patient Instructions (Addendum)
Was a pleasure to see you today!  I am glad that you are feeling better!  You should continue to take your pantoprazole 40 mg twice daily, 30 minutes prior to breakfast and dinner.  Continue Carafate 1 g 4 times daily for 2 weeks and then stop.  Monitor for recurrent nausea and vomiting.  If nausea vomiting reoccurs, he may use Zofran every 8 hours as needed.  Will follow-up in 3-4 months.  It was a pleasure to see you today. I want to create trusting relationships with patients. If you receive a survey regarding your visit,  I greatly appreciate you taking time to fill this out on paper or through your MyChart. I value your feedback.  Brooke Bonito, MSN, FNP-BC, AGACNP-BC [facility]

## 2022-06-22 ENCOUNTER — Non-Acute Institutional Stay: Payer: Medicare Other | Admitting: Family Medicine

## 2022-06-22 ENCOUNTER — Encounter: Payer: Self-pay | Admitting: Family Medicine

## 2022-06-22 VITALS — BP 110/70 | HR 71 | Temp 97.9°F | Resp 16 | Wt 212.4 lb

## 2022-06-22 DIAGNOSIS — K209 Esophagitis, unspecified without bleeding: Secondary | ICD-10-CM

## 2022-06-22 DIAGNOSIS — I7121 Aneurysm of the ascending aorta, without rupture: Secondary | ICD-10-CM | POA: Insufficient documentation

## 2022-06-22 DIAGNOSIS — K922 Gastrointestinal hemorrhage, unspecified: Secondary | ICD-10-CM

## 2022-06-22 NOTE — Progress Notes (Signed)
[facility] Palliative Care Consult Note Telephone: [phone]  Fax: [phone]   Date of encounter: 06/22/22 6:07 PM PATIENT NAME: Warren Sullivan [id number]   [phone] (home)  DOB: 01-08-1961 MRN: [id number] PRIMARY CARE PROVIDER:    Hal Morales, DO,  [place] [address] [phone]  REFERRING PROVIDER:   Hal Morales, DO [place],  [place] [address] [phone]  RESPONSIBLE PARTY:    Contact Information     Name Relation Home Work Mobile   Warren Sullivan   [phone]   Warren Sullivan   [phone]   Warren Sullivan [phone]          I met face to face with patient in [facility] [place] facility. Palliative Care was asked to follow this patient by consultation request of  Simpson-Tarokh, Leann, DO to address advance care planning and complex medical decision making. This is the initial visit.          ASSESSMENT, SYMPTOM MANAGEMENT AND PLAN / RECOMMENDATIONS:   Esophagitis/GI bleed No further bleeding noted. VSS.   Encourage continued used of PPI and Carafate as prescribed. Avoid irritating foods. Monitor stools for s/sx of bleeding.  Monitor pt for hypotension, tachycardia, tachypnea, lightheadedness and Chest pain if he has bleeding. Smoking cessation would help but pt is not interested in quitting   Ascending aortic aneurysm without rupture Maintain BP < 140/90. On 08/19/21 ascending aorta measured 4.8 cm with recommendation for monitoring Q 6 months. Need to follow up with decision maker on goals of care.  If no surgical intervention planned, would be beneficial to have MOST in place.  Follow up Palliative Care Visit: Palliative care will continue to follow for complex medical decision making, advance care planning, and clarification of goals. Return 4 weeks or prn.    This visit was coded  based on medical decision making (MDM).  PPS: 50%  HOSPICE ELIGIBILITY/DIAGNOSIS: TBD  Chief Complaint:  Warren Sullivan received a referral to follow up with patient for chronic disease management with recent nausea, vomiting and GI bleed in setting of dementia.  Palliative Care is also following for advance directive planning and defining/refining goals of care.  HISTORY OF PRESENT ILLNESS:  Warren Sullivan is a 61 y.o. year old male with dementia without behavioral disturbance, hx of H pylori + biopsies 08/18/21, rare + 10/03/21 and H pylori negative on 01/30/22. Biopsies have shown chronic gastritis and duodenitis negative for malignancy accompanied by coffee ground emesis.  He also has HTN, hx of stroke with residual right sided hemiplegia.  Currently denies pain, fever, nausea and vomiting, constipation.  Does endorse good appetite.  Facility statf indicates he eats well all the time and at times will not remember that he has eaten and ask for more. Recent admission 12/1-4/23 for hematemesis and confusion.  GI was consulted but he has had extensive work up through out the year and has been treated for H, pylori.  HGB was stable and he was tolerating the diet in the hospital so he was d/c'd and GI indicated only conservative follow up as he has diffuse esophagitis and gastritis. His PPI was increased to BID and added Carafate. His HGB was stable at d/c. Nursing facility staff indicate no further hematemesis.  History obtained from review of EMR, discussion with facility staff and/or Warren Sullivan.   08/18/21 SURGICAL PATHOLOGY  THIS IS AN ADDENDUM REPORT  CASE: [id number]  PATIENT: Warren Sullivan  Surgical Pathology Report  Addendum   Reason for Addendum #1:  Immunohistochemistry results  Reason for Addendum #2:  Additional special stains   Clinical History: Possible esophageal mass on CT ([facility])      FINAL MICROSCOPIC DIAGNOSIS:   A. DUODENUM, BIOPSY:  -  Active chronic duodenitis with surface gastric foveolar metaplasia,  consistent with peptic duodenitis.  See comment   B. STOMACH, BIOPSY:  - Gastric antral and oxyntic mucosa with active chronic gastritis.  See  comment   C. CARDIA, BIOPSY:  - Esophageal squamous and cardiac mucosa with active chronic nonspecific  carditis  - Negative for intestinal metaplasia, dysplasia or malignancy   D. ESOPHAGUS, DISTAL, BIOPSY:  - Esophageal squamous and cardiac mucosa showing severe acute  nonspecific esophagitis with ulceration and reactive hyperplasia  - Negative for intestinal metaplasia, dysplasia or malignancy       COMMENT:   A and B.   Immunohistochemical stain for Helicobacter pylori is pending  and will be reported in an addendum.    D.  Differential diagnosis can include infection, reflux, protracted  vomiting and drug-effect.  Viral cytopathic changes are not seen.  GMS  stain to rule out fungal organisms is pending and will be reported in an  addendum.     GROSS DESCRIPTION:   A.  Received in formalin are tan, soft tissue fragments that are  submitted in toto. Number: 4 size: 0.1 to 0.3 cm blocks: 1   B.  Received in formalin are tan, soft tissue fragments that are  submitted in toto. Number: 6 size: 0.1 to 0.2 cm blocks: 1   C.  Received in formalin are tan, soft tissue fragments that are  submitted in toto. Number: 5 size: 0.1 to 0.3 cm blocks: 1   D.  Received in formalin are tan, soft tissue fragments that are  submitted in toto. Number: Multiple size: 0.1 to 0.3 cm blocks: 1  (KW, 08/21/2021)     Final Diagnosis performed by Jaquita Folds, MD.   Electronically  signed 08/22/2021  Technical component performed at [facility] [place]  [address], [place], [place] [address].   Professional component performed at [facility],  [place] [address] [place], [place] [address].   Immunohistochemistry Technical component (if  applicable) was performed  at [facility] [address] [place],  [place], [place] [address].   IMMUNOHISTOCHEMISTRY DISCLAIMER (if applicable):  Some of these immunohistochemical stains may have been developed and the  performance characteristics determine by [facility]. Some  may not have been cleared or approved by the U.S. Food and Drug  Administration. The FDA has determined that such clearance or approval  is not necessary. This test is used for clinical purposes. It should not  be regarded as investigational or for research. This laboratory is  certified under the [place]  (CLIA-88) as qualified to perform high complexity clinical laboratory  testing.  The controls stained appropriately.      ADDENDUM:    A and B.  Immunohistochemical stains for Helicobacter pylori are  positive.   08/19/21 CT chest with contrast: IMPRESSION: 1. Mass-like thickening of the distal esophagus just before the gastroesophageal junction concerning for potential neoplasm. Prominent borderline enlarged middle mediastinal lymph nodes are noted adjacent to this. Correlation with results of recent biopsy are recommended to exclude neoplasm. 2. 7 x 5 mm (mean diameter of 6 mm) right  Warren lobe pulmonary nodule. This is nonspecific. Attention on follow-up studies is recommended to ensure stability or regression, however, particularly in light of the potential esophageal neoplasm. 3. Aortic atherosclerosis, in addition to left main and three-vessel coronary artery disease. Please note that although the presence of coronary artery calcium documents the presence of coronary artery disease, the severity of this disease and any potential stenosis cannot be assessed on this non-gated CT examination. Assessment for potential risk factor modification, dietary therapy or pharmacologic therapy may be warranted, if clinically  indicated. 4. There is also aneurysmal dilatation of the ascending thoracic aorta (4.8 cm in diameter). Ascending thoracic aortic aneurysm. Recommend semi-annual imaging followup by CTA or MRA and referral to cardiothoracic surgery if not already obtained. This recommendation follows 2010ACCF/AHA/AATS/ACR/ASA/SCA/SCAI/SIR/STS/SVM Guidelines for the Diagnosis and Management of Patients With Thoracic Aortic Disease. Circulation. 2010; 121: V785-Y850. Aortic aneurysm NOS (ICD10-I71.9).   06/08/22 CT Chest, ABD and pelvis with contrast: IMPRESSION: 1. Findings compatible with a diffuse esophagitis. 2. Stable findings stranding and small lymph nodes surrounding the gastroesophageal junction with small hiatal hernia. Findings may be related to gastritis and esophagitis. Underlying neoplasm not excluded at this level. No evidence for fluid collection or pneumomediastinum. 3. Colonic diverticulosis.  Large stool burden in the rectum. 4. Subcentimeter left Bosniak I benign renal cyst. No follow-up imaging is recommended.     Latest Ref Rng & Units 06/11/2022    4:31 AM 06/10/2022    5:01 AM 06/09/2022    5:12 AM  CBC  WBC 4.0 - 10.5 K/uL 9.7  9.9  11.1   Hemoglobin 13.0 - 17.0 g/dL 12.4  12.2  14.2   Hematocrit 39.0 - 52.0 % 37.5  37.5  43.5   Platelets 150 - 400 K/uL 141  144  173        Latest Ref Rng & Units 06/11/2022    4:31 AM 06/10/2022    5:01 AM 06/09/2022    5:12 AM  CMP  Glucose 70 - 99 mg/dL 95  108  116   BUN 8 - 23 mg/dL 39  59  42   Creatinine 0.61 - 1.24 mg/dL 1.04  2.17  1.13   Sodium 135 - 145 mmol/L 144  144  145   Potassium 3.5 - 5.1 mmol/L 3.4  3.4  3.5   Chloride 98 - 111 mmol/L 108  107  107   CO2 22 - 32 mmol/L _0 Calcium 8.9 - 10.3 mg/dL 8.3  8.5  8.8   Total Protein 6.5 - 8.1 g/dL  6.3    Total Bilirubin 0.3 - 1.2 mg/dL  0.8    Alkaline Phos 38 - 126 U/L  50    AST 15 - 41 U/L  10    ALT 0 - 44 U/L  10             Component Ref Range & Units 2 wk  ago 9 mo ago 10 mo ago 9 yr ago 13 yr ago  Color, Urine _1  YELLOW  APPearance _2  CLEAR  Specific Gravity, Urine 1.005 - 1.030 1.026 1.016 >1.046 High  1.005 1.006  pH 5.0 - 8.0 6.0 5.0 6.0 6.5 7.0  Glucose, UA NEGATIVE mg/dL _3   Hgb urine dipstick NEGATIVE SMALL Abnormal  SMALL Abnormal  NEGATIVE NEGATIVE NEGATIVE  Bilirubin Urine _4  NEGATIVE  Ketones, ur NEGATIVE mg/dL 5 Abnormal  NEGATIVE NEGATIVE NEGATIVE NEGATIVE  Protein, ur NEGATIVE mg/dL 100 Abnormal  NEGATIVE 30 Abnormal  NEGATIVE NEGATIVE  Nitrite _0  NEGATIVE  Leukocytes,Ua NEGATIVE NEGATIVE NEGATIVE NEGATIVE    RBC / HPF 0 - 5 RBC/hpf 11-20 0-5 0-5    WBC, UA 0 - 5 WBC/hpf 0-5 0-5 0-5    Bacteria, UA NONE SEEN RARE Abnormal  NONE SEEN NONE SEEN    Squamous Epithelial / LPF 0 - 5 0-5 0-5 CM 0-5    Mucus  PRESENT  PRESENT CM    Comment: Performed at [facility], [address], [place], [place] [address]  Urobilinogen, UA     0.2 R 1.0 R  Leukocytes, UA     NEGATIVE R, CM    NEGATIVE MICROSCOPIC NOT DONE ON URINES WITH NEGATIVE PROTEIN, BLOOD, LEUKOCYTES, NITRITE, OR GLUCOSE <1000 mg/dL.                     I reviewed EMR for available labs, medications, imaging, studies and related documents.  Records reviewed and summarized above.   ROS General: NAD EYES: denies vision changes ENMT: denies dysphagia Cardiovascular: denies chest pain, denies DOE Pulmonary: denies cough, denies increased SOB Abdomen: endorses good appetite if it is a food he likes, endorses some trouble with having to strain at times, endorses continence of bowel GU: denies dysuria, endorses continence of urine MSK:  denies increased weakness, no falls reported Skin: denies rashes or wounds Neurological: denies pain, denies insomnia Psych: Endorses positive  mood Heme/lymph/immuno: denies bruises, abnormal bleeding  Physical Exam: Current and past weights: 212 lbs 6.4 oz as of 06/21/22 Constitutional: NAD General: obese  EYES: anicteric sclera, lids intact, no discharge  ENMT: intact hearing, oral mucous membranes moist CV: S1S2, RRR, no LE edema Pulmonary: CTAB, no increased work of breathing, no cough, room air Abdomen: normo-active BS + 4 quadrants, soft and non tender, no ascites GU: deferred MSK: no sarcopenia, moves all extremities, uses wc for mobility Skin: warm and dry, no rashes or wounds on visible skin Neuro:  noted right sided generalized weakness, noted cognitive impairment Psych: non-anxious affect, A and O x 2 Hem/lymph/immuno: no widespread bruising  CURRENT PROBLEM LIST:  Patient Active Problem List   Diagnosis Date Noted   Esophagitis 06/09/2022   Hiatal hernia 06/09/2022   Abnormal CT of the chest 06/09/2022   GI bleed 06/09/2022   Stroke ([place]) 06/08/2022   Hemiplegia, unspecified affecting right dominant side ([place]) 06/08/2022   Emesis, persistent 60/45/4098   Helicobacter pylori duodenitis 11/06/2021   Fall at home, initial encounter 08/29/2021   Pneumonia 08/28/2021   Hypokalemia 08/18/2021   Hyponatremia 08/18/2021   AKI (acute kidney injury) ([place]) 08/18/2021   Hypocalcemia 08/18/2021   Hyperglycemia 08/18/2021   Vomiting 08/18/2021   GERD (gastroesophageal reflux disease) 08/18/2021   Dehydration 08/18/2021   Hyperlipidemia, unspecified 08/18/2021   Dementia without behavioral disturbance ([place]) 08/18/2021   Small bowel obstruction ([place]) 08/17/2021   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 10/19/2015   Tobacco dependence 06/21/2015   Essential hypertension 06/14/2015   PAST MEDICAL HISTORY:  Active Ambulatory Problems    Diagnosis Date Noted   Essential hypertension 06/14/2015   Tobacco dependence 06/21/2015   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 10/19/2015   Small bowel  obstruction ([place]) 08/17/2021   Hypokalemia 08/18/2021   Hyponatremia 08/18/2021   AKI (acute kidney injury) ([place]) 08/18/2021   Hypocalcemia 08/18/2021   Hyperglycemia 08/18/2021  Vomiting 08/18/2021   GERD (gastroesophageal reflux disease) 08/18/2021   Dehydration 08/18/2021   Hyperlipidemia, unspecified 08/18/2021   Dementia without behavioral disturbance ([place]) 08/18/2021   Pneumonia 08/28/2021   Fall at home, initial encounter 33/54/5625   Helicobacter pylori duodenitis 11/06/2021   Emesis, persistent 01/29/2022   Stroke ([place]) 06/08/2022   Hemiplegia, unspecified affecting right dominant side ([place]) 06/08/2022   Esophagitis 06/09/2022   Hiatal hernia 06/09/2022   Abnormal CT of the chest 06/09/2022   GI bleed 06/09/2022   Resolved Ambulatory Problems    Diagnosis Date Noted   Esophageal mass 08/18/2021   Past Medical History:  Diagnosis Date   CVA (cerebral vascular accident) ([place])    Dementia in other diseases classified elsewhere, severe, without behavioral disturbance, psychotic disturbance, mood disturbance, and anxiety (HCC)    Hyperlipidemia    Hypertension    MDD (major depressive disorder)    Vitamin D deficiency    SOCIAL HX:  Social History   Tobacco Use   Smoking status: Every Day    Packs/day: 0.25    Types: Cigarettes   Smokeless tobacco: Not on file  Substance Use Topics   Alcohol use: No   FAMILY HX:  Family History  Problem Relation Age of Onset   Hypertension Mother    Stroke Mother    Hypertension Father    Hypertension Sullivan        Preferred Pharmacy: ALLERGIES: No Known Allergies   PERTINENT MEDICATIONS:  Outpatient Encounter Medications as of 06/22/2022  Medication Sig   acetaminophen (TYLENOL) 650 MG CR tablet Take 1,300 mg by mouth every 8 (eight) hours as needed (general discomfort).   albuterol (PROVENTIL) (2.5 MG/3ML) 0.083% nebulizer solution Take 3 mLs (2.5 mg total) by nebulization every 4 (four) hours as needed for  wheezing or shortness of breath.   aspirin EC 81 MG tablet Take 81 mg by mouth daily. Swallow whole.   atorvastatin (LIPITOR) 20 MG tablet Take 1 tablet (20 mg total) by mouth at bedtime.   Calcium Carbonate (CALCIUM 500 PO) Take 1 tablet by mouth daily.   Cholecalciferol 25 MCG (1000 UT) capsule Take 5,000 Units by mouth daily.   cloNIDine (CATAPRES) 0.1 MG tablet Take 2 tablets (0.2 mg total) by mouth 2 (two) times daily.   gabapentin (NEURONTIN) 300 MG capsule Take 1 capsule (300 mg total) by mouth 3 (three) times daily. Start with one at bedtime for 1 week. Then two at bedtime for one week, then go to full dose.   memantine (NAMENDA) 10 MG tablet Take 10 mg by mouth 2 (two) times daily.   Multiple Vitamin (MULTIVITAMIN) tablet Take 1 tablet by mouth daily.   ondansetron (ZOFRAN) 4 MG tablet Take 4 mg by mouth every 8 (eight) hours as needed for nausea or vomiting.   pantoprazole (PROTONIX) 40 MG tablet Take 1 tablet (40 mg total) by mouth 2 (two) times daily.   sucralfate (CARAFATE) 1 GM/10ML suspension Take 10 mLs (1 g total) by mouth every 6 (six) hours. For 2 weeks as per GI recommendations   tamsulosin (FLOMAX) 0.4 MG CAPS capsule Take 0.4 mg by mouth daily.   VASCEPA 1 g capsule Take 2 g by mouth 2 (two) times daily.   Venlafaxine HCl 75 MG TB24 Take 75 mg by mouth daily.   vitamin B-12 (CYANOCOBALAMIN) 500 MCG tablet Take 500 mcg by mouth daily.   No facility-administered encounter medications on file as of 06/22/2022.      Advance Care Planning/Goals of  Care:  Identification  of a healthcare agent   CODE STATUS: Currently full code    Thank you for the opportunity to participate in the care of Mr. Walsh.  The palliative care team will continue to follow. Please call our office at [phone] if we can be of additional assistance.   Marijo Conception, FNP-C  COVID-19 PATIENT SCREENING TOOL Asked and negative response unless otherwise noted:  Have you had symptoms of  covid, tested positive or been in contact with someone with symptoms/positive test in the past 5-10 days?  Unknown

## 2022-07-08 ENCOUNTER — Telehealth: Payer: Self-pay | Admitting: Family Medicine

## 2022-07-08 DIAGNOSIS — U071 COVID-19: Secondary | ICD-10-CM | POA: Insufficient documentation

## 2022-07-08 NOTE — Telephone Encounter (Signed)
Marliss Coots, nurse at facility called to say pt tested positive for Covid 19 today.  She states he doesn't feel bad, has a runny nose.  Pt has a recent hx of diffuse esophagitis with vomiting, and intermittent uncontrolled hypertension on multiple agents with hx of stroke and is in a wc with poor mobility. Pt is not on anticoagulation.  Last CR 3 weeks ago was 1.04.  Orders sent to [facility] pharmacy in [place] for Paxlovid 300-100 mg BID x 5 days.  Advised contact droplet precautions and use of mask when pt outside of room.  Pt has routine orders which includes Robitussin DM.  Advised to hold Atorvastatin for 7 days then resume.  Prescription e-prescribed.  Joycelyn Man FNP-C

## 2022-07-24 ENCOUNTER — Encounter: Payer: Self-pay | Admitting: Family Medicine

## 2022-07-24 ENCOUNTER — Non-Acute Institutional Stay: Payer: Medicare Other | Admitting: Family Medicine

## 2022-07-24 VITALS — BP 112/64 | HR 59 | Temp 97.8°F | Resp 16 | Wt 214.6 lb

## 2022-07-24 DIAGNOSIS — Z515 Encounter for palliative care: Secondary | ICD-10-CM

## 2022-07-24 DIAGNOSIS — U071 COVID-19: Secondary | ICD-10-CM

## 2022-07-24 NOTE — Progress Notes (Signed)
[facility] Palliative Care Consult Note Telephone: [phone]  Fax: [phone]   Date of encounter: 07/24/22 11:27 PM PATIENT NAME: Warren [facility] Home [id number]   [phone] (home)  DOB: 03-Oct-1960 MRN: [id number] PRIMARY CARE PROVIDER:    Hal Morales, DO,  [place] [address] [phone]  REFERRING PROVIDER:   Hal Morales, DO [place],  [place] [address] [phone]  RESPONSIBLE PARTY:    Contact Information     Name Relation Home Work Mobile   [place] Sister   [phone]   [place], [place]   [phone]   Delene Loll [phone]          I met face to face with patient in [facility] [place] facility. Palliative Care was asked to follow this patient by consultation request of  Simpson-Tarokh, Leann, DO to address advance care planning and complex medical decision making. This is the follow up visit.          ASSESSMENT, SYMPTOM MANAGEMENT AND PLAN / RECOMMENDATIONS:  Covid 19 virus infection Resolved with no identified sequelae at present.  Palliative Care encounter Follow up with family to see if pt has [facility] POA Start discussions of goals of care  Follow up Palliative Care Visit: Palliative care will continue to follow for complex medical decision making, advance care planning, and clarification of goals. Return 4 weeks or prn.    This visit was coded based on medical decision making (MDM).  PPS: 50%  HOSPICE ELIGIBILITY/DIAGNOSIS: TBD  Chief Complaint:  [place] continues to follow up with patient for chronic disease management with recent Covid infection in setting of dementia.  Palliative Care is also following for advance directive planning and defining/refining goals of care.  HISTORY OF PRESENT ILLNESS:  Warren Sullivan is a 62 y.o. year old male with  dementia without behavioral disturbance, hx of H pylori + biopsies 08/18/21, rare + 10/03/21 and H pylori negative on 01/30/22. Biopsies have shown chronic gastritis and duodenitis negative for malignancy accompanied by coffee ground emesis.  He also has HTN, hx of stroke with residual right sided hemiplegia.  Currently denies pain, fever, nausea and vomiting, hematemesis/blood in stools and constipation.  Does endorse good appetite and no falls. He self propels in his wc. He was recently treated for mild course of Covid infection with antiviral therapy and per staff he has done well.  History obtained from review of EMR, discussion with facility staff and/or Warren Sullivan.    I reviewed EMR for available labs, medications, imaging, studies and related documents.  No new records since last visit.   ROS General: NAD EYES: denies vision changes ENMT: denies dysphagia Cardiovascular: denies chest pain/DOE Pulmonary: denies cough, denies increased SOB Abdomen: endorses good appetite, per staff continent of bowel GU: denies dysuria,  continent of urine MSK:  denies increased weakness, no falls reported, using WC for mobility Skin: denies rashes or wounds Neurological: denies pain and insomnia Psych: Endorses positive mood Heme/lymph/immuno: denies bruises, abnormal bleeding  Physical Exam: Current and past weights: 212 lbs 6.4 oz as of 06/21/22, 214.6 on 07/16/22 facility scale Constitutional: NAD General: obese, mildly disheveled  EYES: anicteric sclera, lids intact, no discharge  ENMT: intact hearing, oral mucous membranes moist CV: S1S2, RRR, trace non-pitting BLE edema Pulmonary: CTAB, no increased work of breathing, no cough or dyspnea, room air Abdomen: normo-active BS + 4 quadrants, soft and non  tender, no ascites GU: deferred MSK: no sarcopenia, moves all extremities, uses wc for mobility Skin: warm and dry, no rashes or wounds on visible skin Neuro:  noted right sided generalized  weakness, noted cognitive impairment Psych: non-anxious affect, A and O x 2 Hem/lymph/immuno: no widespread bruising  CURRENT PROBLEM LIST:  Patient Active Problem List   Diagnosis Date Noted   COVID-19 virus infection 07/08/2022   Aneurysm of ascending aorta without rupture (HCC) 06/22/2022   Esophagitis 06/09/2022   Hiatal hernia 06/09/2022   Abnormal CT of the chest 06/09/2022   GI bleed 06/09/2022   Stroke (HCC) 06/08/2022   Hemiplegia, unspecified affecting right dominant side (HCC) 06/08/2022   Emesis, persistent 01/29/2022   Helicobacter pylori duodenitis 11/06/2021   Hypokalemia 08/18/2021   Hyponatremia 08/18/2021   Hypocalcemia 08/18/2021   Hyperglycemia 08/18/2021   Vomiting 08/18/2021   GERD (gastroesophageal reflux disease) 08/18/2021   Hyperlipidemia, unspecified 08/18/2021   Dementia without behavioral disturbance (HCC) 08/18/2021   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 10/19/2015   Tobacco dependence 06/21/2015   Essential hypertension 06/14/2015   PAST MEDICAL HISTORY:  Active Ambulatory Problems    Diagnosis Date Noted   Essential hypertension 06/14/2015   Tobacco dependence 06/21/2015   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 10/19/2015   Hypokalemia 08/18/2021   Hyponatremia 08/18/2021   Hypocalcemia 08/18/2021   Hyperglycemia 08/18/2021   Vomiting 08/18/2021   GERD (gastroesophageal reflux disease) 08/18/2021   Hyperlipidemia, unspecified 08/18/2021   Dementia without behavioral disturbance (HCC) 08/18/2021   Helicobacter pylori duodenitis 11/06/2021   Emesis, persistent 01/29/2022   Stroke (HCC) 06/08/2022   Hemiplegia, unspecified affecting right dominant side (HCC) 06/08/2022   Esophagitis 06/09/2022   Hiatal hernia 06/09/2022   Abnormal CT of the chest 06/09/2022   GI bleed 06/09/2022   Aneurysm of ascending aorta without rupture (HCC) 06/22/2022   COVID-19 virus infection 07/08/2022   Resolved Ambulatory Problems     Diagnosis Date Noted   Small bowel obstruction (HCC) 08/17/2021   AKI (acute kidney injury) (HCC) 08/18/2021   Esophageal mass 08/18/2021   Dehydration 08/18/2021   Pneumonia 08/28/2021   Fall at home, initial encounter 08/29/2021   Past Medical History:  Diagnosis Date   CVA (cerebral vascular accident) (HCC)    Dementia in other diseases classified elsewhere, severe, without behavioral disturbance, psychotic disturbance, mood disturbance, and anxiety (HCC)    Hyperlipidemia    Hypertension    MDD (major depressive disorder)    Vitamin D deficiency    SOCIAL HX:  Social History   Tobacco Use   Smoking status: Every Day    Packs/day: 0.25    Types: Cigarettes   Smokeless tobacco: Not on file  Substance Use Topics   Alcohol use: No   FAMILY HX:  Family History  Problem Relation Age of Onset   Hypertension Mother    Stroke Mother    Hypertension Father    Hypertension Sister        Preferred Pharmacy: ALLERGIES: No Known Allergies   PERTINENT MEDICATIONS:  Outpatient Encounter Medications as of 07/24/2022  Medication Sig   acetaminophen (TYLENOL) 650 MG CR tablet Take 1,300 mg by mouth every 8 (eight) hours as needed (general discomfort).   albuterol (PROVENTIL) (2.5 MG/3ML) 0.083% nebulizer solution Take 3 mLs (2.5 mg total) by nebulization every 4 (four) hours as needed for wheezing or shortness of breath.   aspirin EC 81 MG tablet Take 81 mg by mouth daily. Swallow whole.  atorvastatin (LIPITOR) 20 MG tablet Take 1 tablet (20 mg total) by mouth at bedtime.   Calcium Carbonate (CALCIUM 500 PO) Take 1 tablet by mouth daily.   Cholecalciferol 25 MCG (1000 UT) capsule Take 5,000 Units by mouth daily.   cloNIDine (CATAPRES) 0.1 MG tablet Take 2 tablets (0.2 mg total) by mouth 2 (two) times daily.   gabapentin (NEURONTIN) 300 MG capsule Take 1 capsule (300 mg total) by mouth 3 (three) times daily. Start with one at bedtime for 1 week. Then two at bedtime for one week,  then go to full dose.   memantine (NAMENDA) 10 MG tablet Take 10 mg by mouth 2 (two) times daily.   Multiple Vitamin (MULTIVITAMIN) tablet Take 1 tablet by mouth daily.   ondansetron (ZOFRAN) 4 MG tablet Take 4 mg by mouth every 8 (eight) hours as needed for nausea or vomiting.   pantoprazole (PROTONIX) 40 MG tablet Take 1 tablet (40 mg total) by mouth 2 (two) times daily.   sucralfate (CARAFATE) 1 GM/10ML suspension Take 10 mLs (1 g total) by mouth every 6 (six) hours. For 2 weeks as per GI recommendations   tamsulosin (FLOMAX) 0.4 MG CAPS capsule Take 0.4 mg by mouth daily.   VASCEPA 1 g capsule Take 2 g by mouth 2 (two) times daily.   Venlafaxine HCl 75 MG TB24 Take 75 mg by mouth daily.   vitamin B-12 (CYANOCOBALAMIN) 500 MCG tablet Take 500 mcg by mouth daily.   No facility-administered encounter medications on file as of 07/24/2022.      Advance Care Planning/Goals of Care:  Identification of a healthcare agent- has both sister Warren Sullivan and son Warren Sullivan listed as to notify on admission  CODE STATUS: Currently full code    Thank you for the opportunity to participate in the care of Mr. Eltzroth.  The palliative care team will continue to follow. Please call our office at [phone] if we can be of additional assistance.   Marijo Conception, FNP-C  COVID-19 PATIENT SCREENING TOOL Asked and negative response unless otherwise noted:  Have you had symptoms of covid, tested positive or been in contact with someone with symptoms/positive test in the past 5-10 days?  Tested positive after last visit 06/23/22

## 2022-08-28 ENCOUNTER — Non-Acute Institutional Stay: Payer: Medicare Other | Admitting: Family Medicine

## 2022-08-28 VITALS — BP 128/52 | HR 70 | Temp 98.0°F

## 2022-08-28 DIAGNOSIS — Z515 Encounter for palliative care: Secondary | ICD-10-CM

## 2022-08-28 DIAGNOSIS — F0390 Unspecified dementia without behavioral disturbance: Secondary | ICD-10-CM

## 2022-08-28 NOTE — Progress Notes (Signed)
[facility] Pall[facility] Consult Note Telephone: [phone]  Fax: [phone]   Date of encounter: 08/28/22 12:29 PM PATIENT NAME: Warren Sullivan [id number]   [phone] (home)  DOB: 01-Sep-1960 MRN: [id number] PRIMARY CARE PROVIDER:    Hal Morales, DO,  [place] [address] [phone]  REFERRING PROVIDER:   Hal Morales, DO [place],   [id number] [phone]  RESPONSIBLE PARTY:    Contact Information     Name Relation Home Work Mobile   [place] Sister   [phone]   [place], [place]   [phone]   Delene Loll [phone]          I met face to face with patient in [facility] [place] facility. Palliative Care was asked to follow this patient by consultation request of  Simpson-Tarokh, Leann, DO to address advance care planning and complex medical decision making. This is the follow up visit.          ASSESSMENT, SYMPTOM MANAGEMENT AND PLAN / RECOMMENDATIONS:   Dementia without behavioral disturbance Fast 7 score 6b Continue Namenda BID   Palliative Care Encounter Plan to follow up with family on goals of care to address interventions  Follow up Palliative Care Visit: Palliative care will continue to follow for complex medical decision making, advance care planning, and clarification of goals. Return 4 weeks or prn.    This visit was coded based on medical decision making (MDM).  PPS: 50%  HOSPICE ELIGIBILITY/DIAGNOSIS: TBD  Chief Complaint:  [place] continues to follow up with patient for chronic disease management in setting of dementia.  Palliative Care is also following for advance directive planning and defining/refining goals of care.  HISTORY OF PRESENT ILLNESS:  Warren Sullivan is a 62 y.o. year old male with dementia without behavioral  disturbance, hx of H pylori + biopsies 08/18/21, rare + 10/03/21 and H pylori negative on 01/30/22. Biopsies have shown chronic gastritis and duodenitis negative for malignancy accompanied by coffee ground emesis.  He also has HTN, hx of stroke with residual right sided hemiplegia.  Currently denies pain, fever, nausea and vomiting, hematemesis/blood in stools and constipation.  Does endorse good appetite and no falls. He self propels in his wc. He was recently treated for mild course of Covid infection with antiviral therapy and per staff he has done well.  History obtained from review of EMR, discussion with facility staff and/or Mr. Fok.    I reviewed EMR for available labs, medications, imaging, studies and related documents.  No new records since last visit.   ROS General: NAD EYES: denies vision changes ENMT: denies dysphagia Cardiovascular: denies chest pain/DOE Pulmonary: denies cough, denies increased SOB Abdomen: endorses good appetite, per staff continent of bowel GU: denies dysuria,  continent of urine MSK:  denies increased weakness, no falls reported, using WC for mobility Skin: denies rashes or wounds Neurological: denies pain and insomnia Psych: Endorses positive mood Heme/lymph/immuno: denies bruises, abnormal bleeding  Physical Exam: Current and past weights: 212 lbs 6.4 oz as of 06/21/22, 214.6 on 07/16/22 facility scale Constitutional: NAD General: obese, mildly disheveled  EYES: anicteric sclera, lids intact, no discharge  ENMT: intact hearing, oral mucous membranes moist CV: S1S2, RRR, trace non-pitting BLE edema Pulmonary: CTAB, no increased work of breathing, no cough or dyspnea, room air Abdomen: normo-active BS + 4 quadrants, soft and non tender, no ascites GU: deferred MSK:  no sarcopenia, moves all extremities, uses wc for mobility Skin: warm and dry, no rashes or wounds on visible skin Neuro:  noted right sided generalized weakness, noted cognitive  impairment Psych: non-anxious affect, A and O x 2 Hem/lymph/immuno: no widespread bruising  CURRENT PROBLEM LIST:  Patient Active Problem List   Diagnosis Date Noted   COVID-19 virus infection 07/08/2022   Aneurysm of ascending aorta without rupture ([place]) 06/22/2022   Esophagitis 06/09/2022   Hiatal hernia 06/09/2022   Abnormal CT of the chest 06/09/2022   GI bleed 06/09/2022   Stroke ([place]) 06/08/2022   Hemiplegia, unspecified affecting right dominant side ([place]) 06/08/2022   Emesis, persistent 0000000   Helicobacter pylori duodenitis 11/06/2021   Hypokalemia 08/18/2021   Hyponatremia 08/18/2021   Hypocalcemia 08/18/2021   Hyperglycemia 08/18/2021   Vomiting 08/18/2021   GERD (gastroesophageal reflux disease) 08/18/2021   Hyperlipidemia, unspecified 08/18/2021   Dementia without behavioral disturbance ([place]) 08/18/2021   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 10/19/2015   Tobacco dependence 06/21/2015   Essential hypertension 06/14/2015   PAST MEDICAL HISTORY:  Active Ambulatory Problems    Diagnosis Date Noted   Essential hypertension 06/14/2015   Tobacco dependence 06/21/2015   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 10/19/2015   Hypokalemia 08/18/2021   Hyponatremia 08/18/2021   Hypocalcemia 08/18/2021   Hyperglycemia 08/18/2021   Vomiting 08/18/2021   GERD (gastroesophageal reflux disease) 08/18/2021   Hyperlipidemia, unspecified 08/18/2021   Dementia without behavioral disturbance ([place]) 0000000   Helicobacter pylori duodenitis 11/06/2021   Emesis, persistent 01/29/2022   Stroke ([place]) 06/08/2022   Hemiplegia, unspecified affecting right dominant side ([place]) 06/08/2022   Esophagitis 06/09/2022   Hiatal hernia 06/09/2022   Abnormal CT of the chest 06/09/2022   GI bleed 06/09/2022   Aneurysm of ascending aorta without rupture ([place]) 06/22/2022   COVID-19 virus infection 07/08/2022   Resolved Ambulatory Problems    Diagnosis Date Noted    Small bowel obstruction ([place]) 08/17/2021   AKI (acute kidney injury) ([place]) 08/18/2021   Esophageal mass 08/18/2021   Dehydration 08/18/2021   Pneumonia 08/28/2021   Fall at home, initial encounter 08/29/2021   Past Medical History:  Diagnosis Date   CVA (cerebral vascular accident) ([place])    Dementia in other diseases classified elsewhere, severe, without behavioral disturbance, psychotic disturbance, mood disturbance, and anxiety ([place])    Hyperlipidemia    Hypertension    MDD (major depressive disorder)    Vitamin D deficiency    SOCIAL HX:  Social History   Tobacco Use   Smoking status: Every Day    Packs/day: 0.25    Types: Cigarettes   Smokeless tobacco: Not on file  Substance Use Topics   Alcohol use: No   FAMILY HX:  Family History  Problem Relation Age of Onset   Hypertension Mother    Stroke Mother    Hypertension Father    Hypertension Sister        Preferred Pharmacy: ALLERGIES: No Known Allergies   PERTINENT MEDICATIONS:  Outpatient Encounter Medications as of 08/28/2022  Medication Sig   acetaminophen (TYLENOL) 650 MG CR tablet Take 1,300 mg by mouth every 8 (eight) hours as needed (general discomfort).   albuterol (PROVENTIL) (2.5 MG/3ML) 0.083% nebulizer solution Take 3 mLs (2.5 mg total) by nebulization every 4 (four) hours as needed for wheezing or shortness of breath.   aspirin EC 81 MG tablet Take 81 mg by mouth daily. Swallow whole.   atorvastatin (LIPITOR) 20 MG  tablet Take 1 tablet (20 mg total) by mouth at bedtime.   Calcium Carbonate (CALCIUM 500 PO) Take 1 tablet by mouth daily.   Cholecalciferol 25 MCG (1000 UT) capsule Take 5,000 Units by mouth daily.   cloNIDine (CATAPRES) 0.1 MG tablet Take 2 tablets (0.2 mg total) by mouth 2 (two) times daily.   gabapentin (NEURONTIN) 300 MG capsule Take 1 capsule (300 mg total) by mouth 3 (three) times daily. Start with one at bedtime for 1 week. Then two at bedtime for one week, then go to full dose.    memantine (NAMENDA) 10 MG tablet Take 10 mg by mouth 2 (two) times daily.   Multiple Vitamin (MULTIVITAMIN) tablet Take 1 tablet by mouth daily.   ondansetron (ZOFRAN) 4 MG tablet Take 4 mg by mouth every 8 (eight) hours as needed for nausea or vomiting.   pantoprazole (PROTONIX) 40 MG tablet Take 1 tablet (40 mg total) by mouth 2 (two) times daily.   sucralfate (CARAFATE) 1 GM/10ML suspension Take 10 mLs (1 g total) by mouth every 6 (six) hours. For 2 weeks as per GI recommendations   tamsulosin (FLOMAX) 0.4 MG CAPS capsule Take 0.4 mg by mouth daily.   VASCEPA 1 g capsule Take 2 g by mouth 2 (two) times daily.   Venlafaxine HCl 75 MG TB24 Take 75 mg by mouth daily.   vitamin B-12 (CYANOCOBALAMIN) 500 MCG tablet Take 500 mcg by mouth daily.   No facility-administered encounter medications on file as of 08/28/2022.      Advance Care Planning/Goals of Care:  Identification of a healthcare agent- has both sister Mikeal Hawthorne and son Yeriel Rittel listed as to notify on admission  CODE STATUS: Currently full code    Thank you for the opportunity to participate in the care of Mr. Garde.  The palliative care team will continue to follow. Please call our office at [phone] if we can be of additional assistance.   Marijo Conception, FNP-C  COVID-19 PATIENT SCREENING TOOL Asked and negative response unless otherwise noted:  Have you had symptoms of covid, tested positive or been in contact with someone with symptoms/positive test in the past 5-10 days?  unknown

## 2022-09-03 ENCOUNTER — Encounter: Payer: Self-pay | Admitting: Family Medicine

## 2022-09-03 DIAGNOSIS — Z515 Encounter for palliative care: Secondary | ICD-10-CM | POA: Insufficient documentation

## 2022-10-03 NOTE — Progress Notes (Addendum)
GI Office Note    Referring Provider: Hal Sullivan, * Primary Care Physician:  Warren Morales, DO Primary Gastroenterologist: Warren Quale, MD  Date:  10/04/2022  ID:  Warren Sullivan, DOB [birth year], MRN [id number]   Chief Complaint   Chief Complaint  Patient presents with   Follow-up    Patient here today for a follow up on his [place]. Patient denies any current gi issues. Patient takes Pantoprazole 40 mg one bid.      History of Present Illness  Warren Sullivan is a 62 y.o. male with a history of dementia, CVA, hemiplegia, dyslipidemia, depression, GERD with esophagitis, and hiatal hernia presenting today for follow up.   Hospitalization 06/09/2022 for intractable nausea and vomiting, suspected bloody emesis.  Patient noted to be a poor historian, sister provided majority of HPI.  Patient had had nausea vomiting for 2 days and unable to eat or drink despite good appetite.  Clear nonbloody emesis in the ED.  CT chest, abdomen, pelvis with diffuse esophagitis, hiatal hernia, stable small lymph nodes surrounding GE junction.  Given extensive endoscopic workup in 2023 and similar CT findings to prior, he was treated supportively with IV PPI twice daily as well as Carafate every 6 hours.  IV fluids provided for AKI.  With PPI and Carafate, patient was able to tolerate diet and was discharged 06/11/2022.  Prior endoscopic workup: EGD February 2023: -Esophagitis with ulceration -Peptic duodenitis -Active chronic gastritis with H. Pylori  (treated with bismuth quadruple therapy)   EGD March 2023 -Erythematous duodenopathy -Mild carditis -Chronic gastritis with H. pylori s/p treatment with levofloxacin salvage therapy   EGD July 2023 -Hiatal hernia -Submucosal nodule lower third of esophagus -Normal stomach -Normal duodenum -Gastric biopsies with intestinal metaplasia, negative for H. Pylori  Last seen 06/14/22. No N/V, abdominal pian. Good appetite.  Unsure about brbpr or melena. No heartburn, dysphagia, constipation, diarrhea. Advised to continue PPI BID, carafate for 2 weeks, zofran as needed for nausea, GERD diet.   Today: GERD/esophagitis, H. Pylori: Good appetite. No abdominal pain, N/V. No reflux. No constipation, diarrhea. No dysphagia. No weight loss. Sister present today and states she has not been made aware of any recent issues with N/V, hematemesis, etc.     Current Outpatient Medications  Medication Sig Dispense Refill   acetaminophen (TYLENOL) 650 MG CR tablet Take 1,300 mg by mouth every 8 (eight) hours as needed (general discomfort).     albuterol (PROVENTIL) (2.5 MG/3ML) 0.083% nebulizer solution Take 3 mLs (2.5 mg total) by nebulization every 4 (four) hours as needed for wheezing or shortness of breath. 75 mL 12   atorvastatin (LIPITOR) 20 MG tablet Take 1 tablet (20 mg total) by mouth at bedtime.     Calcium Carbonate (CALCIUM 500 PO) Take 1 tablet by mouth daily.     Cholecalciferol 25 MCG (1000 UT) capsule Take 5,000 Units by mouth daily.     cloNIDine (CATAPRES) 0.1 MG tablet Take 2 tablets (0.2 mg total) by mouth 2 (two) times daily. 60 tablet 3   gabapentin (NEURONTIN) 300 MG capsule Take 1 capsule (300 mg total) by mouth 3 (three) times daily. Start with one at bedtime for 1 week. Then two at bedtime for one week, then go to full dose. 90 capsule 5   memantine (NAMENDA) 10 MG tablet Take 10 mg by mouth 2 (two) times daily.     Multiple Vitamin (MULTIVITAMIN) tablet Take 1 tablet by mouth daily.     ondansetron (ZOFRAN) 4  MG tablet Take 4 mg by mouth every 8 (eight) hours as needed for nausea or vomiting.     pantoprazole (PROTONIX) 40 MG tablet Take 1 tablet (40 mg total) by mouth 2 (two) times daily. 60 tablet 3   tamsulosin (FLOMAX) 0.4 MG CAPS capsule Take 0.4 mg by mouth daily.     VASCEPA 1 g capsule Take 2 g by mouth 2 (two) times daily.     Venlafaxine HCl 75 MG TB24 Take 75 mg by mouth daily.     vitamin  B-12 (CYANOCOBALAMIN) 500 MCG tablet Take 500 mcg by mouth daily.     No current facility-administered medications for this visit.    Past Medical History:  Diagnosis Date   AKI (acute kidney injury) ([place]) 08/18/2021   CVA (cerebral vascular accident) ([place])    Dehydration 08/18/2021   Dementia in other diseases classified elsewhere, severe, without behavioral disturbance, psychotic disturbance, mood disturbance, and anxiety ([place])    Fall at home, initial encounter 08/29/2021   GERD (gastroesophageal reflux disease)    Hemiplegia, unspecified affecting right dominant side ([place])    Hyperlipidemia    Hypertension    MDD (major depressive disorder)    Pneumonia 08/28/2021   Small bowel obstruction ([place]) 08/17/2021   Stroke ([place])    Vitamin D deficiency     Past Surgical History:  Procedure Laterality Date   BIOPSY  08/18/2021   Procedure: BIOPSY;  Surgeon: Warren Quale, MD;  [facility];  Service: Gastroenterology;;  Warren Sullivan and distal esophagus biopsies    BIOPSY  10/03/2021   Procedure: BIOPSY;  Surgeon: Warren Quale, MD;  [facility];  Service: Gastroenterology;;   BIOPSY  01/30/2022   Procedure: BIOPSY;  Surgeon: Warren Quale, MD;  [facility];  Service: Gastroenterology;;   ESOPHAGOGASTRODUODENOSCOPY (EGD) WITH PROPOFOL N/A 08/18/2021   Surgeon: Warren Sullivan, Warren Quince, MD; severe acute nonspecific esophagitis with ulceration and reactive hyperplasia, peptic duodenitis, acute chronic gastritis with H. pylori, 3 cm hiatal hernia.   ESOPHAGOGASTRODUODENOSCOPY (EGD) WITH PROPOFOL N/A 10/03/2021   Surgeon: Warren Sullivan, Warren Quince, MD; myocarditis, chronic gastritis with H. pylori, 2 cm hiatal hernia, erythematous duodenopathy.   ESOPHAGOGASTRODUODENOSCOPY (EGD) WITH PROPOFOL N/A 01/30/2022   Procedure: ESOPHAGOGASTRODUODENOSCOPY (EGD) WITH PROPOFOL;  Surgeon: Warren Quale, MD;   [facility];  Service: Gastroenterology;  Laterality: N/A;   HERNIA REPAIR      Family History  Problem Relation Age of Onset   Hypertension Mother    Stroke Mother    Hypertension Father    Hypertension Sister     Allergies as of 10/04/2022   (No Known Allergies)    Social History   Socioeconomic History   Marital status: Divorced    Spouse name: Not on file   Number of children: Not on file   Years of education: Not on file   Highest education level: Not on file  Occupational History   Not on file  Tobacco Use   Smoking status: Every Day    Packs/day: .25    Types: Cigarettes   Smokeless tobacco: Not on file  Vaping Use   Vaping Use: Never used  Substance and Sexual Activity   Alcohol use: No   Drug use: No   Sexual activity: Not on file  Other Topics Concern   Not on file  Social History Narrative   Not on file   Social Determinants of Health   Financial Resource Strain: Not on file  Food Insecurity: No Food Insecurity (06/09/2022)   Hunger Vital Sign    Worried About Running Out of Food in the Last Year: Never true    Ran Out of Food in the Last Year: Never true  Transportation Needs: No Transportation Needs (06/09/2022)   PRAPARE - Hydrologist (Medical): No    Lack of Transportation (Non-Medical): No  Physical Activity: Not on file  Stress: Not on file  Social Connections: Not on file     Review of Systems   Gen: Denies fever, chills, anorexia. Denies fatigue, weakness, weight loss.  CV: Denies chest pain, palpitations, syncope, peripheral edema, and claudication. Resp: Denies dyspnea at rest, cough, wheezing, coughing up blood, and pleurisy. GI: See HPI Derm: Denies rash, itching, dry skin Psych: Denies depression, anxiety, memory loss, confusion. No homicidal or suicidal ideation.  Heme: Denies bruising, bleeding, and enlarged lymph nodes.   Physical Exam   BP 128/81 (BP Location: Right Arm, Patient  Position: Sitting, Cuff Size: Large)   Pulse 65   Temp (!) 97.5 F (36.4 C) (Temporal)   Ht 6' (1.829 m)   Wt 218 lb (98.9 kg)   BMI 29.57 kg/m   General:   Alert and oriented. No distress noted. Pleasant and cooperative.  In wheelchair Head:  Normocephalic and atraumatic. Eyes:  Conjuctiva clear without scleral icterus. Mouth:  Oral mucosa pink and moist. Good dentition. No lesions. Lungs:  Clear to auscultation bilaterally. No wheezes, rales, or rhonchi. No distress.  Heart:  S1, S2 present without murmurs appreciated.  Abdomen:  +BS, soft, non-tender and non-distended. No rebound or guarding. No HSM or masses noted. Rectal: deferred Msk: In wheelchair, unable to assess gait. Extremities:  Without edema. Neurologic:  Alert and  oriented x4.  Slow to respond to questions Psych:  Alert and cooperative. Normal mood and affect.   Assessment  HUTSON NAJARRO is a 62 y.o. male with a history of dementia, CVA, hemiplegia, dyslipidemia, depression, GERD with esophagitis and H. Pylori s/p documented eradication, and hiatal hernia presenting today for follow up.   GERD, esophagitis, history of h. Pylori gastritis: History of multiple EGDs and 2023 as outlined in HPI.  Multiple tissue biopsies with H. pylori gastritis and he was treated with bismuth quadruple therapy and subsequently levofloxacin salvage therapy.  Had negative biopsies in July 2023.  Was treated with IV PPI and Carafate for esophagitis in December 2023.  He has not been experiencing any further nausea or vomiting episodes.  Denies any lack of appetite, dysphagia, abdominal pain, unintentional weight loss.  Has no complaints for me today.  Will continue pantoprazole 40 mg twice daily for now.  PLAN   Continue pantoprazole 40 mg BID. GERD diet/lifestyle modifications Advised to monitor for melena or brbpr.  Follow up in 6 months.     Warren Night, MSN, FNP-BC, AGACNP-BC [facility]  I have  reviewed the note and agree with the APP's assessment as described in this progress note  Given recurrent episodes of hematemesis/coffee ground emesis, he should continue PPI BID  Warren Peppers, MD Gastroenterology and [facility]

## 2022-10-04 ENCOUNTER — Encounter: Payer: Self-pay | Admitting: Gastroenterology

## 2022-10-04 ENCOUNTER — Ambulatory Visit (INDEPENDENT_AMBULATORY_CARE_PROVIDER_SITE_OTHER): Payer: Medicare Other | Admitting: Gastroenterology

## 2022-10-04 VITALS — BP 128/81 | HR 65 | Temp 97.5°F | Ht 72.0 in | Wt 218.0 lb

## 2022-10-04 DIAGNOSIS — K2970 Gastritis, unspecified, without bleeding: Secondary | ICD-10-CM | POA: Diagnosis not present

## 2022-10-04 DIAGNOSIS — K219 Gastro-esophageal reflux disease without esophagitis: Secondary | ICD-10-CM

## 2022-10-04 DIAGNOSIS — K209 Esophagitis, unspecified without bleeding: Secondary | ICD-10-CM | POA: Diagnosis not present

## 2022-10-04 DIAGNOSIS — B9681 Helicobacter pylori [H. pylori] as the cause of diseases classified elsewhere: Secondary | ICD-10-CM | POA: Diagnosis not present

## 2022-10-04 NOTE — Patient Instructions (Signed)
Continue taking pantoprazole 40 mg twice daily.  Follow a GERD diet:  Avoid fried, fatty, greasy, spicy, citrus foods. Avoid caffeine and carbonated beverages. Avoid chocolate. Try eating 4-6 small meals a day rather than 3 large meals. Do not eat within 3 hours of laying down. Prop head of bed up on wood or bricks to create a 6 inch incline.  Continue to monitor for any rectal bleeding or black/tarry stools.  Please notify the office if this occurs.  Will plan to follow-up in 6 months.  It was a pleasure to see you today. I want to create trusting relationships with patients. If you receive a survey regarding your visit,  I greatly appreciate you taking time to fill this out on paper or through your MyChart. I value your feedback.  Venetia Night, MSN, FNP-BC, AGACNP-BC [facility]

## 2022-10-25 ENCOUNTER — Non-Acute Institutional Stay: Payer: Medicare Other | Admitting: Family Medicine

## 2022-10-25 DIAGNOSIS — I10 Essential (primary) hypertension: Secondary | ICD-10-CM

## 2022-10-25 DIAGNOSIS — F0390 Unspecified dementia without behavioral disturbance: Secondary | ICD-10-CM

## 2022-10-25 NOTE — Progress Notes (Signed)
Therapist, nutritional Palliative Care Consult Note Telephone: [phone]  Fax: [phone]   Date of encounter: 10/25/22 9:46 AM PATIENT NAME: Warren Sullivan [employer] [address] [place] [address]   [phone] (home)  DOB: [birth year] MRN: [id number] PRIMARY CARE PROVIDER:    Sherol Dade, DO,  [address] [place] [address] [phone]  REFERRING PROVIDER:   Sherol Dade, DO [address] [place],  [place] [address] [phone]  Emergency Contact:    Contact Information     Name Relation Home Work Mobile   Turner Sister   [phone]   jader, kos   [phone]   Everette Rank [phone]         Health Care POA/Health Care Agent   I met face to face with patient in [facility] facility. Palliative Care was asked to follow this patient by consultation request of Simpson-Tarokh, Leann, * to address advance care planning and complex medical decision making. This is a follow up visit.  ASSESSMENT AND / RECOMMENDATIONS: PPS: 50%  Dementia:  Currently stable on treatment plan.  Continue on current dosing of Namenda.  No changes recommended at this time.  Staff to continue to monitor and reorient as needed.   Hypertension:  Stable on current therapy.  Continue current dosing of clonidine 0.1mg  PO x 2 tabs BID.  Staff to monitor for and report irregularities in BP.  Discussed reporting parameters and s/sx of stroke.   Follow up Palliative Care Visit:  Palliative Care continuing to follow up by monitoring for changes in appetite, weight, functional and cognitive status for chronic disease progression and management in agreement with patient's stated goals of care. Next visit in 4 weeks or prn.  Chief Complaint : Follow up Palliative Care visit for medical management visit relating chronic conditions and comorbidities in setting of CVA with residual hemiplegia, HTN and  Dementia.   HISTORY OF PRESENT ILLNESS: Warren Sullivan is a 62 y.o. year old male. No acute complaints currently.  No recent falls or hospitalizations.  No recent medication changes.  Currently stable on treatment plan.    ACTIVITIES OF DAILY LIVING: CONTINENT OF BLADDER? Yes with incontinence episodes CONTINENT OF BOWEL? Yes  BATHING/DRESSING/FEEDING: Requires assistance with bathing and dressing, able to feed independently   MOBILITY:   Wheelchair    APPETITE? Good   This visit was coded based on medical decision making (MDM).  CURRENT PROBLEM LIST:  Patient Active Problem List   Diagnosis Date Noted   Palliative care encounter 09/03/2022   COVID-19 virus infection 07/08/2022   Aneurysm of ascending aorta without rupture 06/22/2022   Esophagitis 06/09/2022   Hiatal hernia 06/09/2022   Abnormal CT of the chest 06/09/2022   GI bleed 06/09/2022   Stroke 06/08/2022   Hemiplegia, unspecified affecting right dominant side 06/08/2022   Emesis, persistent 01/29/2022   Helicobacter pylori duodenitis 11/06/2021   Hypokalemia 08/18/2021   Hyponatremia 08/18/2021   Hypocalcemia 08/18/2021   Hyperglycemia 08/18/2021   Vomiting 08/18/2021   GERD (gastroesophageal reflux disease) 08/18/2021   Hyperlipidemia, unspecified 08/18/2021   Dementia without behavioral disturbance 08/18/2021   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 10/19/2015   Tobacco dependence 06/21/2015   Essential hypertension 06/14/2015   PAST MEDICAL HISTORY:  Active Ambulatory Problems    Diagnosis Date Noted   Essential hypertension 06/14/2015   Tobacco dependence 06/21/2015   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 10/19/2015   Hypokalemia 08/18/2021   Hyponatremia  08/18/2021   Hypocalcemia 08/18/2021   Hyperglycemia 08/18/2021   Vomiting 08/18/2021   GERD (gastroesophageal reflux disease) 08/18/2021   Hyperlipidemia, unspecified 08/18/2021   Dementia without behavioral disturbance  08/18/2021   Helicobacter pylori duodenitis 11/06/2021   Emesis, persistent 01/29/2022   Stroke 06/08/2022   Hemiplegia, unspecified affecting right dominant side 06/08/2022   Esophagitis 06/09/2022   Hiatal hernia 06/09/2022   Abnormal CT of the chest 06/09/2022   GI bleed 06/09/2022   Aneurysm of ascending aorta without rupture 06/22/2022   COVID-19 virus infection 07/08/2022   Palliative care encounter 09/03/2022   Resolved Ambulatory Problems    Diagnosis Date Noted   Small bowel obstruction 08/17/2021   AKI (acute kidney injury) 08/18/2021   Esophageal mass 08/18/2021   Dehydration 08/18/2021   Pneumonia 08/28/2021   Fall at home, initial encounter 08/29/2021   Past Medical History:  Diagnosis Date   CVA (cerebral vascular accident) ([facility])    Dementia in other diseases classified elsewhere, severe, without behavioral disturbance, psychotic disturbance, mood disturbance, and anxiety ([facility])    Hyperlipidemia    Hypertension    MDD (major depressive disorder)    Vitamin D deficiency    SOCIAL HX:  Social History   Tobacco Use   Smoking status: Every Day    Packs/day: .25    Types: Cigarettes   Smokeless tobacco: Not on file  Substance Use Topics   Alcohol use: No   FAMILY HX:  Family History  Problem Relation Age of Onset   Hypertension Mother    Stroke Mother    Hypertension Father    Hypertension Sister        Preferred Pharmacy: ALLERGIES: No Known Allergies   PERTINENT MEDICATIONS:  Outpatient Encounter Medications as of 10/25/2022  Medication Sig   acetaminophen (TYLENOL) 650 MG CR tablet Take 1,300 mg by mouth every 8 (eight) hours as needed (general discomfort).   albuterol (PROVENTIL) (2.5 MG/3ML) 0.083% nebulizer solution Take 3 mLs (2.5 mg total) by nebulization every 4 (four) hours as needed for wheezing or shortness of breath.   atorvastatin (LIPITOR) 20 MG tablet Take 1 tablet (20 mg total) by mouth at bedtime.   Calcium Carbonate (CALCIUM 500  PO) Take 1 tablet by mouth daily.   Cholecalciferol 25 MCG (1000 UT) capsule Take 5,000 Units by mouth daily.   cloNIDine (CATAPRES) 0.1 MG tablet Take 2 tablets (0.2 mg total) by mouth 2 (two) times daily.   gabapentin (NEURONTIN) 300 MG capsule Take 1 capsule (300 mg total) by mouth 3 (three) times daily. Start with one at bedtime for 1 week. Then two at bedtime for one week, then go to full dose.   memantine (NAMENDA) 10 MG tablet Take 10 mg by mouth 2 (two) times daily.   Multiple Vitamin (MULTIVITAMIN) tablet Take 1 tablet by mouth daily.   ondansetron (ZOFRAN) 4 MG tablet Take 4 mg by mouth every 8 (eight) hours as needed for nausea or vomiting.   pantoprazole (PROTONIX) 40 MG tablet Take 1 tablet (40 mg total) by mouth 2 (two) times daily.   tamsulosin (FLOMAX) 0.4 MG CAPS capsule Take 0.4 mg by mouth daily.   VASCEPA 1 g capsule Take 2 g by mouth 2 (two) times daily.   Venlafaxine HCl 75 MG TB24 Take 75 mg by mouth daily.   vitamin B-12 (CYANOCOBALAMIN) 500 MCG tablet Take 500 mcg by mouth daily.   No facility-administered encounter medications on file as of 10/25/2022.   Physical Exam: GENERAL: NAD HEENT:  Edentulous  LUNGS: CTAB,  no increased respiratory effort or accessory muscle use, No SOB  CARDIAC:  S1S2, RRR with no MRG, trace bipedal edema, No cyanosis ABD:  Normo-active BS x 4 quads, soft, non-tender EXTREMITIES: Decreased ROM, no deformity, weakness to BLE, No muscle atrophy/subcutaneous fat loss  NEURO:  Mild cognitive impairment, Impaired balance PSYCH:  non-anxious affect, A & O x 2, forgetful  History obtained from review of EMR, discussion with facility staff/caregiver and/or patient.     Please call our main office at [phone] if we can be of additional assistance.    Joycelyn Man FNP-C  [facility]  Phone:  [phone]

## 2022-11-01 ENCOUNTER — Ambulatory Visit (INDEPENDENT_AMBULATORY_CARE_PROVIDER_SITE_OTHER): Payer: Medicare Other | Admitting: Gastroenterology

## 2022-11-08 ENCOUNTER — Ambulatory Visit (INDEPENDENT_AMBULATORY_CARE_PROVIDER_SITE_OTHER): Payer: Medicare Other | Admitting: Gastroenterology

## 2022-11-09 ENCOUNTER — Encounter: Payer: Self-pay | Admitting: Family Medicine

## 2022-11-25 ENCOUNTER — Inpatient Hospital Stay (HOSPITAL_COMMUNITY)
Admission: EM | Admit: 2022-11-25 | Discharge: 2022-12-01 | DRG: 871 | Disposition: A | Discharge status: Skilled Nursing Facility | Payer: Medicare Other | Source: Skilled Nursing Facility | Attending: Internal Medicine | Admitting: Internal Medicine

## 2022-11-25 ENCOUNTER — Emergency Department (HOSPITAL_COMMUNITY): Payer: Medicare Other

## 2022-11-25 ENCOUNTER — Other Ambulatory Visit: Payer: Self-pay

## 2022-11-25 ENCOUNTER — Encounter (HOSPITAL_COMMUNITY): Payer: Self-pay

## 2022-11-25 DIAGNOSIS — K529 Noninfective gastroenteritis and colitis, unspecified: Secondary | ICD-10-CM | POA: Diagnosis present

## 2022-11-25 DIAGNOSIS — N401 Enlarged prostate with lower urinary tract symptoms: Secondary | ICD-10-CM | POA: Diagnosis present

## 2022-11-25 DIAGNOSIS — R338 Other retention of urine: Secondary | ICD-10-CM | POA: Diagnosis present

## 2022-11-25 DIAGNOSIS — K56600 Partial intestinal obstruction, unspecified as to cause: Secondary | ICD-10-CM | POA: Diagnosis present

## 2022-11-25 DIAGNOSIS — R6520 Severe sepsis without septic shock: Secondary | ICD-10-CM | POA: Diagnosis present

## 2022-11-25 DIAGNOSIS — R7989 Other specified abnormal findings of blood chemistry: Secondary | ICD-10-CM | POA: Diagnosis not present

## 2022-11-25 DIAGNOSIS — Z1152 Encounter for screening for COVID-19: Secondary | ICD-10-CM

## 2022-11-25 DIAGNOSIS — F17210 Nicotine dependence, cigarettes, uncomplicated: Secondary | ICD-10-CM | POA: Diagnosis present

## 2022-11-25 DIAGNOSIS — E782 Mixed hyperlipidemia: Secondary | ICD-10-CM | POA: Diagnosis present

## 2022-11-25 DIAGNOSIS — R7302 Impaired glucose tolerance (oral): Secondary | ICD-10-CM | POA: Diagnosis present

## 2022-11-25 DIAGNOSIS — Z8249 Family history of ischemic heart disease and other diseases of the circulatory system: Secondary | ICD-10-CM

## 2022-11-25 DIAGNOSIS — I69354 Hemiplegia and hemiparesis following cerebral infarction affecting left non-dominant side: Secondary | ICD-10-CM

## 2022-11-25 DIAGNOSIS — E785 Hyperlipidemia, unspecified: Secondary | ICD-10-CM | POA: Diagnosis present

## 2022-11-25 DIAGNOSIS — Z8719 Personal history of other diseases of the digestive system: Secondary | ICD-10-CM | POA: Diagnosis not present

## 2022-11-25 DIAGNOSIS — J440 Chronic obstructive pulmonary disease with acute lower respiratory infection: Secondary | ICD-10-CM | POA: Diagnosis present

## 2022-11-25 DIAGNOSIS — I4892 Unspecified atrial flutter: Secondary | ICD-10-CM | POA: Diagnosis not present

## 2022-11-25 DIAGNOSIS — F32A Depression, unspecified: Secondary | ICD-10-CM | POA: Diagnosis present

## 2022-11-25 DIAGNOSIS — R1110 Vomiting, unspecified: Secondary | ICD-10-CM | POA: Diagnosis not present

## 2022-11-25 DIAGNOSIS — Z993 Dependence on wheelchair: Secondary | ICD-10-CM | POA: Diagnosis not present

## 2022-11-25 DIAGNOSIS — J9601 Acute respiratory failure with hypoxia: Secondary | ICD-10-CM | POA: Diagnosis present

## 2022-11-25 DIAGNOSIS — F02C4 Dementia in other diseases classified elsewhere, severe, with anxiety: Secondary | ICD-10-CM | POA: Diagnosis present

## 2022-11-25 DIAGNOSIS — F02C3 Dementia in other diseases classified elsewhere, severe, with mood disturbance: Secondary | ICD-10-CM | POA: Diagnosis present

## 2022-11-25 DIAGNOSIS — I10 Essential (primary) hypertension: Secondary | ICD-10-CM | POA: Diagnosis not present

## 2022-11-25 DIAGNOSIS — F0390 Unspecified dementia without behavioral disturbance: Secondary | ICD-10-CM | POA: Diagnosis not present

## 2022-11-25 DIAGNOSIS — F17200 Nicotine dependence, unspecified, uncomplicated: Secondary | ICD-10-CM | POA: Diagnosis present

## 2022-11-25 DIAGNOSIS — K920 Hematemesis: Secondary | ICD-10-CM

## 2022-11-25 DIAGNOSIS — K219 Gastro-esophageal reflux disease without esophagitis: Secondary | ICD-10-CM | POA: Diagnosis present

## 2022-11-25 DIAGNOSIS — J9602 Acute respiratory failure with hypercapnia: Secondary | ICD-10-CM | POA: Diagnosis not present

## 2022-11-25 DIAGNOSIS — A419 Sepsis, unspecified organism: Principal | ICD-10-CM | POA: Insufficient documentation

## 2022-11-25 DIAGNOSIS — J189 Pneumonia, unspecified organism: Secondary | ICD-10-CM

## 2022-11-25 DIAGNOSIS — Z79899 Other long term (current) drug therapy: Secondary | ICD-10-CM

## 2022-11-25 DIAGNOSIS — E559 Vitamin D deficiency, unspecified: Secondary | ICD-10-CM | POA: Diagnosis present

## 2022-11-25 DIAGNOSIS — J181 Lobar pneumonia, unspecified organism: Secondary | ICD-10-CM | POA: Diagnosis not present

## 2022-11-25 DIAGNOSIS — Z823 Family history of stroke: Secondary | ICD-10-CM

## 2022-11-25 DIAGNOSIS — K921 Melena: Secondary | ICD-10-CM | POA: Diagnosis not present

## 2022-11-25 DIAGNOSIS — R739 Hyperglycemia, unspecified: Secondary | ICD-10-CM | POA: Diagnosis present

## 2022-11-25 DIAGNOSIS — R933 Abnormal findings on diagnostic imaging of other parts of digestive tract: Secondary | ICD-10-CM | POA: Diagnosis not present

## 2022-11-25 LAB — BLOOD GAS, VENOUS
Acid-Base Excess: 4.5 mmol/L — ABNORMAL HIGH (ref 0.0–2.0)
Bicarbonate: 32.7 mmol/L — ABNORMAL HIGH (ref 20.0–28.0)
Drawn by: 442
O2 Saturation: 31.3 %
Patient temperature: 39.3
pCO2, Ven: 69 mmHg — ABNORMAL HIGH (ref 44–60)
pH, Ven: 7.3 (ref 7.25–7.43)
pO2, Ven: <31 mmHg — CL (ref 32–45)

## 2022-11-25 LAB — LACTIC ACID, PLASMA: Lactic Acid, Venous: 1.6 mmol/L (ref 0.5–1.9)

## 2022-11-25 LAB — PROTIME-INR
INR: 1 (ref 0.8–1.2)
Prothrombin Time: 13.8 seconds (ref 11.4–15.2)

## 2022-11-25 LAB — CBC WITH DIFFERENTIAL/PLATELET
Abs Immature Granulocytes: 0.05 10*3/uL (ref 0.00–0.07)
Basophils Absolute: 0 10*3/uL (ref 0.0–0.1)
Basophils Relative: 0 %
Eosinophils Absolute: 0 10*3/uL (ref 0.0–0.5)
Eosinophils Relative: 0 %
HCT: 45.7 % (ref 39.0–52.0)
Hemoglobin: 15.1 g/dL (ref 13.0–17.0)
Immature Granulocytes: 0 %
Lymphocytes Relative: 5 %
Lymphs Abs: 0.7 10*3/uL (ref 0.7–4.0)
MCH: 29.9 pg (ref 26.0–34.0)
MCHC: 33 g/dL (ref 30.0–36.0)
MCV: 90.5 fL (ref 80.0–100.0)
Monocytes Absolute: 0.6 10*3/uL (ref 0.1–1.0)
Monocytes Relative: 4 %
Neutro Abs: 12.8 10*3/uL — ABNORMAL HIGH (ref 1.7–7.7)
Neutrophils Relative %: 91 %
Platelets: 148 10*3/uL — ABNORMAL LOW (ref 150–400)
RBC: 5.05 MIL/uL (ref 4.22–5.81)
RDW: 13.7 % (ref 11.5–15.5)
WBC: 14.2 10*3/uL — ABNORMAL HIGH (ref 4.0–10.5)
nRBC: 0 % (ref 0.0–0.2)

## 2022-11-25 LAB — COMPREHENSIVE METABOLIC PANEL
ALT: 21 U/L (ref 0–44)
AST: 20 U/L (ref 15–41)
Albumin: 4.2 g/dL (ref 3.5–5.0)
Alkaline Phosphatase: 71 U/L (ref 38–126)
Anion gap: 11 (ref 5–15)
BUN: 8 mg/dL (ref 8–23)
CO2: 30 mmol/L (ref 22–32)
Calcium: 8.5 mg/dL — ABNORMAL LOW (ref 8.9–10.3)
Chloride: 96 mmol/L — ABNORMAL LOW (ref 98–111)
Creatinine, Ser: 0.9 mg/dL (ref 0.61–1.24)
GFR, Estimated: >60 mL/min (ref >60)
Glucose, Bld: 202 mg/dL — ABNORMAL HIGH (ref 70–99)
Potassium: 3.5 mmol/L (ref 3.5–5.1)
Sodium: 137 mmol/L (ref 135–145)
Total Bilirubin: 0.7 mg/dL (ref 0.3–1.2)
Total Protein: 7.5 g/dL (ref 6.5–8.1)

## 2022-11-25 LAB — TROPONIN I (HIGH SENSITIVITY)
Troponin I (High Sensitivity): 6 ng/L (ref <18)
Troponin I (High Sensitivity): 35 ng/L — ABNORMAL HIGH (ref <18)

## 2022-11-25 LAB — SARS CORONAVIRUS 2 BY RT PCR: SARS Coronavirus 2 by RT PCR: NEGATIVE

## 2022-11-25 LAB — BRAIN NATRIURETIC PEPTIDE: B Natriuretic Peptide: 36 pg/mL (ref 0.0–100.0)

## 2022-11-25 LAB — PROCALCITONIN: Procalcitonin: 0.57 ng/mL

## 2022-11-25 LAB — MRSA NEXT GEN BY PCR, NASAL: MRSA by PCR Next Gen: NOT DETECTED

## 2022-11-25 LAB — HEMOGLOBIN A1C
Hgb A1c MFr Bld: 5.8 % — ABNORMAL HIGH (ref 4.8–5.6)
Mean Plasma Glucose: 119.76 mg/dL

## 2022-11-25 LAB — APTT: aPTT: 23 seconds — ABNORMAL LOW (ref 24–36)

## 2022-11-25 MED ORDER — ATORVASTATIN CALCIUM 20 MG PO TABS
20.0000 mg | ORAL_TABLET | Freq: Every day | ORAL | Status: DC
  Administered 2022-11-25 – 2022-11-30 (×5): 20 mg via ORAL
  Filled 2022-11-25 (×6): qty 1

## 2022-11-25 MED ORDER — MEMANTINE HCL 10 MG PO TABS
10.0000 mg | ORAL_TABLET | Freq: Two times a day (BID) | ORAL | Status: DC
  Administered 2022-11-25 – 2022-12-01 (×12): 10 mg via ORAL
  Filled 2022-11-25 (×11): qty 1

## 2022-11-25 MED ORDER — LACTATED RINGERS IV SOLN: INTRAVENOUS | Status: AC

## 2022-11-25 MED ORDER — ONDANSETRON HCL 4 MG/2ML IJ SOLN
4.0000 mg | Freq: Four times a day (QID) | INTRAMUSCULAR | Status: DC | PRN
  Administered 2022-11-26 – 2022-11-30 (×3): 4 mg via INTRAVENOUS
  Filled 2022-11-25 (×3): qty 2

## 2022-11-25 MED ORDER — IPRATROPIUM-ALBUTEROL 0.5-2.5 (3) MG/3ML IN SOLN
3.0000 mL | Freq: Once | RESPIRATORY_TRACT | Status: AC
  Administered 2022-11-25: 3 mL via RESPIRATORY_TRACT
  Filled 2022-11-25: qty 3

## 2022-11-25 MED ORDER — SODIUM CHLORIDE 0.9 % IV SOLN
500.0000 mg | INTRAVENOUS | Status: AC
  Administered 2022-11-25 – 2022-11-29 (×5): 500 mg via INTRAVENOUS
  Filled 2022-11-25 (×5): qty 5

## 2022-11-25 MED ORDER — ENOXAPARIN SODIUM 60 MG/0.6ML IJ SOSY
50.0000 mg | PREFILLED_SYRINGE | INTRAMUSCULAR | Status: DC
  Administered 2022-11-25 – 2022-11-28 (×4): 50 mg via SUBCUTANEOUS
  Filled 2022-11-25 (×5): qty 0.6

## 2022-11-25 MED ORDER — IPRATROPIUM-ALBUTEROL 0.5-2.5 (3) MG/3ML IN SOLN
3.0000 mL | Freq: Three times a day (TID) | RESPIRATORY_TRACT | Status: DC
  Administered 2022-11-26 – 2022-11-29 (×10): 3 mL via RESPIRATORY_TRACT
  Filled 2022-11-25 (×9): qty 3

## 2022-11-25 MED ORDER — TAMSULOSIN HCL 0.4 MG PO CAPS
0.4000 mg | ORAL_CAPSULE | Freq: Every day | ORAL | Status: DC
  Administered 2022-11-25 – 2022-12-01 (×7): 0.4 mg via ORAL
  Filled 2022-11-25 (×7): qty 1

## 2022-11-25 MED ORDER — ACETAMINOPHEN 325 MG PO TABS: 650.0000 mg | ORAL_TABLET | Freq: Four times a day (QID) | ORAL | Status: DC | PRN

## 2022-11-25 MED ORDER — ONDANSETRON HCL 4 MG PO TABS: 4.0000 mg | ORAL_TABLET | Freq: Four times a day (QID) | ORAL | Status: DC | PRN

## 2022-11-25 MED ORDER — VENLAFAXINE HCL ER 75 MG PO CP24
75.0000 mg | ORAL_CAPSULE | Freq: Every day | ORAL | Status: DC
  Administered 2022-11-25 – 2022-12-01 (×7): 75 mg via ORAL
  Filled 2022-11-25 (×3): qty 1
  Filled 2022-11-25: qty 2
  Filled 2022-11-25 (×3): qty 1
  Filled 2022-11-25: qty 2
  Filled 2022-11-25: qty 1

## 2022-11-25 MED ORDER — ACETAMINOPHEN 650 MG RE SUPP: 650.0000 mg | Freq: Four times a day (QID) | RECTAL | Status: DC | PRN

## 2022-11-25 MED ORDER — LACTATED RINGERS IV BOLUS (SEPSIS)
1000.0000 mL | Freq: Once | INTRAVENOUS | Status: AC
  Administered 2022-11-25: 1000 mL via INTRAVENOUS

## 2022-11-25 MED ORDER — ACETAMINOPHEN 650 MG RE SUPP
650.0000 mg | Freq: Once | RECTAL | Status: AC
  Administered 2022-11-25: 650 mg via RECTAL
  Filled 2022-11-25: qty 1

## 2022-11-25 MED ORDER — IPRATROPIUM-ALBUTEROL 0.5-2.5 (3) MG/3ML IN SOLN
3.0000 mL | Freq: Three times a day (TID) | RESPIRATORY_TRACT | Status: DC
  Administered 2022-11-25: 3 mL via RESPIRATORY_TRACT
  Filled 2022-11-25: qty 3

## 2022-11-25 MED ORDER — ADULT MULTIVITAMIN W/MINERALS CH
1.0000 | ORAL_TABLET | Freq: Every day | ORAL | Status: DC
  Administered 2022-11-25 – 2022-11-27 (×3): 1 via ORAL
  Filled 2022-11-25 (×3): qty 1

## 2022-11-25 MED ORDER — VITAMIN B-12 1000 MCG PO TABS
500.0000 ug | ORAL_TABLET | Freq: Every day | ORAL | Status: DC
  Administered 2022-11-25 – 2022-12-01 (×7): 500 ug via ORAL
  Filled 2022-11-25 (×7): qty 1

## 2022-11-25 MED ORDER — BUDESONIDE 0.5 MG/2ML IN SUSP
0.5000 mg | Freq: Two times a day (BID) | RESPIRATORY_TRACT | Status: DC
  Administered 2022-11-25 – 2022-11-30 (×11): 0.5 mg via RESPIRATORY_TRACT
  Filled 2022-11-25 (×12): qty 2

## 2022-11-25 MED ORDER — SODIUM CHLORIDE 0.9 % IV SOLN
2.0000 g | INTRAVENOUS | Status: AC
  Administered 2022-11-25 – 2022-11-29 (×5): 2 g via INTRAVENOUS
  Filled 2022-11-25 (×5): qty 20

## 2022-11-25 MED ORDER — ARFORMOTEROL TARTRATE 15 MCG/2ML IN NEBU
15.0000 ug | INHALATION_SOLUTION | Freq: Two times a day (BID) | RESPIRATORY_TRACT | Status: DC
  Administered 2022-11-25 – 2022-11-30 (×11): 15 ug via RESPIRATORY_TRACT
  Filled 2022-11-25 (×12): qty 2

## 2022-11-25 NOTE — ED Notes (Signed)
Report given to Tori LPN.

## 2022-11-25 NOTE — Hospital Course (Signed)
62 year old with a history of COPD, CVA with left hemiparesis, dementia, hypertension, hyperlipidemia, GERD, BPH presenting from [facility] with hypoxia and possible altered mental status.  Apparently, the patient was noted by staff at [facility] to have some altered mentation.  His vitals were checked, and he was noted to have hypoxia.  Upon EMS arrival, patient had oxygen saturation of 78% on room air.  He was placed on nonrebreather initially,, and he was transported to the emergency department for further evaluation and treatment.  The patient himself denies any fever, chills, chest pain, shortness of breath, nausea, vomiting, diarrhea, abdominal pain, dysuria, hematuria.  He does have a chronic cough.  Patient's sisters are at the bedside to assist with history.  They state that the patient has smoked since he was 62 years old.  They were not able to clarify, she smoked, but they stated that "he smoked like a chimney."  The patient continues to smoke about 4 cigarettes/day. At the bedside, the patient's sisters state that his mental status is at baseline.  At baseline, the patient is wheelchair-bound.  He does not walk.  He requires assistance with transfers. In the ED, the patient was febrile up to 102.4 F with tachycardia 110-120.  He has soft blood pressures with SBP in the 100s.  Oxygen saturation is 94-95% on 3 L.  WBC 14.2, hemoglobin 15.1, platelets 1-40,000.  LFTs were unremarkable.  Sodium 137, potassium 3.5, bicarbonate 30, serum creatinine 0.90.  Chest x-ray showed bilateral infiltrates VBG showed 7.3/69/<31/32.  EKG showed sinus tachycardia with nonspecific T wave changes.  BNP 36.  Lactic acid 1.6.  The patient was started on IV fluids and IV ceftriaxone and azithromycin. His respiratory status gradually improved.  Unfortunately, he developed multiple episodes of emesis, some with coffee grounds in the evening 11/26/22.  NG was placed, but patient pulled it out.

## 2022-11-25 NOTE — ED Provider Notes (Signed)
[place] [facility] AT [facility] Provider Note   CSN: [id number] Arrival date & time: 11/25/22  7829     History  Chief Complaint  Patient presents with   Altered Mental Status    Warren Sullivan is a 62 y.o. male.  Level 5 caveat secondary to dementia.  Patient brought in from his facility for low oxygen saturation.  Found to be satting 78% on room air and placed on nasal cannula.  Does not normally require oxygen.  EMS switched him over to a nonrebreather and transported.  He also said that facility was concerned that he was altered but unclear.  Patient himself denies any complaints.  The history is provided by the patient and the EMS personnel.  Altered Mental Status Presenting symptoms: confusion   Most recent episode:  Today Context: dementia and nursing home resident   Associated symptoms: no abdominal pain and no headaches        Home Medications Prior to Admission medications   Medication Sig Start Date End Date Taking? Authorizing Provider  acetaminophen (TYLENOL) 650 MG CR tablet Take 1,300 mg by mouth every 8 (eight) hours as needed (general discomfort).    [provider]  albuterol (PROVENTIL) (2.5 MG/3ML) 0.083% nebulizer solution Take 3 mLs (2.5 mg total) by nebulization every 4 (four) hours as needed for wheezing or shortness of breath. 01/30/22   Johnson, Clanford L, MD  atorvastatin (LIPITOR) 20 MG tablet Take 1 tablet (20 mg total) by mouth at bedtime. 06/11/22   Glade Lloyd, MD  Calcium Carbonate (CALCIUM 500 PO) Take 1 tablet by mouth daily.    [provider]  Cholecalciferol 25 MCG (1000 UT) capsule Take 5,000 Units by mouth daily.    [provider]  cloNIDine (CATAPRES) 0.1 MG tablet Take 2 tablets (0.2 mg total) by mouth 2 (two) times daily. 08/20/21   Shon Hale, MD  gabapentin (NEURONTIN) 300 MG capsule Take 1 capsule (300 mg total) by mouth 3 (three) times daily. Start with one at bedtime for  1 week. Then two at bedtime for one week, then go to full dose. 10/19/15   Mechele Claude, MD  memantine (NAMENDA) 10 MG tablet Take 10 mg by mouth 2 (two) times daily. 07/24/21   [provider]  Multiple Vitamin (MULTIVITAMIN) tablet Take 1 tablet by mouth daily.    [provider]  ondansetron (ZOFRAN) 4 MG tablet Take 4 mg by mouth every 8 (eight) hours as needed for nausea or vomiting. 02/26/22   [provider]  pantoprazole (PROTONIX) 40 MG tablet Take 1 tablet (40 mg total) by mouth 2 (two) times daily. 08/20/21 10/04/22  Shon Hale, MD  tamsulosin (FLOMAX) 0.4 MG CAPS capsule Take 0.4 mg by mouth daily. 07/11/21   [provider]  VASCEPA 1 g capsule Take 2 g by mouth 2 (two) times daily. 08/08/21   [provider]  Venlafaxine HCl 75 MG TB24 Take 75 mg by mouth daily.    [provider]  vitamin B-12 (CYANOCOBALAMIN) 500 MCG tablet Take 500 mcg by mouth daily.    [provider]      Allergies    Patient has no known allergies.    Review of Systems   Review of Systems  Unable to perform ROS: Dementia  Gastrointestinal:  Negative for abdominal pain.  Neurological:  Negative for headaches.  Psychiatric/Behavioral:  Positive for confusion.     Physical Exam Updated Vital Signs BP 113/67  Pulse 64   Temp 99.4 F (37.4 C) (Oral)   Resp (!) 22   Ht 6' (1.829 m)   Wt 107.9 kg   SpO2 (!) 89% Comment: pt asleep  BMI 32.26 kg/m  Physical Exam Vitals and nursing note reviewed.  Constitutional:      General: He is not in acute distress.    Appearance: Normal appearance. He is well-developed. He is obese.  HENT:     Head: Normocephalic and atraumatic.  Eyes:     Conjunctiva/sclera: Conjunctivae normal.  Cardiovascular:     Rate and Rhythm: Regular rhythm. Tachycardia present.     Heart sounds: No murmur heard. Pulmonary:     Effort: Accessory muscle usage present. No respiratory distress.     Breath sounds:  Examination of the left-upper field reveals rhonchi. Examination of the left-middle field reveals rhonchi. Examination of the left-lower field reveals rhonchi. Rhonchi present.  Abdominal:     Palpations: Abdomen is soft.     Tenderness: There is no abdominal tenderness. There is no guarding or rebound.  Musculoskeletal:        General: No deformity. Normal range of motion.     Cervical back: Neck supple.     Right lower leg: Edema present.     Left lower leg: Edema present.  Skin:    General: Skin is warm and dry.     Capillary Refill: Capillary refill takes less than 2 seconds.  Neurological:     General: No focal deficit present.     Mental Status: He is alert. He is disoriented.     Sensory: No sensory deficit.     Motor: No weakness.     ED Results / Procedures / Treatments   Labs (all labs ordered are listed, but only abnormal results are displayed) Labs Reviewed  COMPREHENSIVE METABOLIC PANEL - Abnormal; Notable for the following components:      Result Value   Chloride 96 (*)    Glucose, Bld 202 (*)    Calcium 8.5 (*)    All other components within normal limits  CBC WITH DIFFERENTIAL/PLATELET - Abnormal; Notable for the following components:   WBC 14.2 (*)    Platelets 148 (*)    Neutro Abs 12.8 (*)    All other components within normal limits  APTT - Abnormal; Notable for the following components:   aPTT 23 (*)    All other components within normal limits  BLOOD GAS, VENOUS - Abnormal; Notable for the following components:   pCO2, Ven 69 (*)    pO2, Ven <31 (*)    Bicarbonate 32.7 (*)    Acid-Base Excess 4.5 (*)    All other components within normal limits  TROPONIN I (HIGH SENSITIVITY) - Abnormal; Notable for the following components:   Troponin I (High Sensitivity) 35 (*)    All other components within normal limits  CULTURE, BLOOD (ROUTINE X 2)  CULTURE, BLOOD (ROUTINE X 2)  SARS CORONAVIRUS 2 BY RT PCR  MRSA NEXT GEN BY PCR, NASAL  PROTIME-INR  BRAIN  NATRIURETIC PEPTIDE  LACTIC ACID, PLASMA  PROCALCITONIN  URINALYSIS, W/ REFLEX TO CULTURE (INFECTION SUSPECTED)  HEMOGLOBIN A1C  HIV ANTIBODY (ROUTINE TESTING W REFLEX)  BASIC METABOLIC PANEL  CBC  TROPONIN I (HIGH SENSITIVITY)    EKG EKG Interpretation  Date/Time:  Sunday Nov 25 2022 09:12:28 EDT Ventricular Rate:  109 PR Interval:  167 QRS Duration: 87 QT Interval:  340 QTC Calculation: 458 R Axis:   47 Text  Interpretation: Sinus tachycardia Ventricular premature complex Aberrant complex Consider left atrial enlargement Borderline T wave abnormalities No significant change since prior 12/23 Confirmed by Meridee Score [phone]) on 11/25/2022 9:20:11 AM  Radiology DG Chest Port 1 View  Result Date: 11/25/2022 CLINICAL DATA:  62 year old male with history of altered mental status. Possible sepsis. EXAM: PORTABLE CHEST 1 VIEW COMPARISON:  Chest x-ray 06/08/2022. FINDINGS: Opacity at the left base which may reflect areas of atelectasis and/or consolidation, with superimposed small left pleural effusion. Diffuse interstitial prominence with widespread peribronchial cuffing. Ill-defined opacity in the right mid lung. No pneumothorax. No evidence of pulmonary edema. Heart size appears borderline enlarged. Moderate hiatal hernia. Mediastinal contours are distorted by patient positioning. Atherosclerotic calcifications are noted in the thoracic aorta. IMPRESSION: 1. Diffuse interstitial prominence and peribronchial cuffing, concerning for an acute bronchitis. Ill-defined opacity in the right mid lung opacity at the left lung base concerning for developing multilobar bronchopneumonia. 2. Small left pleural effusion. Electronically Signed   By: Trudie Reed M.D.   On: 11/25/2022 09:44    Procedures .Critical Care  Performed by: Terrilee Files, MD Authorized by: Terrilee Files, MD   Critical care provider statement:    Critical care time (minutes):  45   Critical care time was  exclusive of:  Separately billable procedures and treating other patients   Critical care was necessary to treat or prevent imminent or life-threatening deterioration of the following conditions:  CNS failure or compromise, respiratory failure and sepsis   Critical care was time spent personally by me on the following activities:  Development of treatment plan with patient or surrogate, discussions with consultants, evaluation of patient's response to treatment, examination of patient, obtaining history from patient or surrogate, ordering and performing treatments and interventions, ordering and review of laboratory studies, ordering and review of radiographic studies, pulse oximetry, re-evaluation of patient's condition and review of old charts   I assumed direction of critical care for this patient from another provider in my specialty: no       Medications Ordered in ED Medications  lactated ringers infusion ( Intravenous Rate/Dose Change 11/25/22 1706)  cefTRIAXone (ROCEPHIN) 2 g in sodium chloride 0.9 % 100 mL IVPB (0 g Intravenous Stopped 11/25/22 1052)  azithromycin (ZITHROMAX) 500 mg in sodium chloride 0.9 % 250 mL IVPB (500 mg Intravenous New Bag/Given 11/25/22 1015)  atorvastatin (LIPITOR) tablet 20 mg (has no administration in time range)  memantine (NAMENDA) tablet 10 mg (10 mg Oral Given 11/25/22 1159)  venlafaxine XR (EFFEXOR-XR) 24 hr capsule 75 mg (75 mg Oral Given 11/25/22 1219)  tamsulosin (FLOMAX) capsule 0.4 mg (0.4 mg Oral Given 11/25/22 1159)  cyanocobalamin (VITAMIN B12) tablet 500 mcg (500 mcg Oral Given 11/25/22 1159)  multivitamin with minerals tablet 1 tablet (1 tablet Oral Given 11/25/22 1159)  enoxaparin (LOVENOX) injection 50 mg (50 mg Subcutaneous Given 11/25/22 1200)  acetaminophen (TYLENOL) tablet 650 mg (has no administration in time range)    Or  acetaminophen (TYLENOL) suppository 650 mg (has no administration in time range)  ondansetron (ZOFRAN) tablet 4 mg (has no  administration in time range)    Or  ondansetron (ZOFRAN) injection 4 mg (has no administration in time range)  ipratropium-albuterol (DUONEB) 0.5-2.5 (3) MG/3ML nebulizer solution 3 mL (3 mLs Nebulization Given 11/25/22 1439)  budesonide (PULMICORT) nebulizer solution 0.5 mg (has no administration in time range)  arformoterol (BROVANA) nebulizer solution 15 mcg (has no administration in time range)  ipratropium-albuterol (DUONEB) 0.5-2.5 (3) MG/3ML nebulizer  solution 3 mL (3 mLs Nebulization Given 11/25/22 0929)  acetaminophen (TYLENOL) suppository 650 mg (650 mg Rectal Given 11/25/22 1003)  lactated ringers bolus 1,000 mL (1,000 mLs Intravenous New Bag/Given 11/25/22 1016)    ED Course/ Medical Decision Making/ A&P Clinical Course as of 11/25/22 1727  Sun Nov 25, 2022  1191 Chest x-ray interpreted by me as possible infiltrate in the right mid and possible left base.  Awaiting radiology reading. [MB]  [phone] Patient has a temperature of 102.7.  Will initiate code sepsis. [MB]  1035 Patient was able to trend down to 3 L nasal cannula [MB]  1051 Patient's sisters are at bedside and I updated them on plan.  Discussed with [facility] hospitalist Dr. Arbutus Leas who will evaluate patient for admission. [MB]    Clinical Course User Index [MB] Terrilee Files, MD                             Medical Decision Making Amount and/or Complexity of Data Reviewed Labs: ordered. Radiology: ordered. ECG/medicine tests: ordered.  Risk OTC drugs. Prescription drug management. Decision regarding hospitalization.   This patient complains of low oxygen level confusion; this involves an extensive number of treatment Options and is a complaint that carries with it a high risk of complications and morbidity. The differential includes pneumonia, PE, sepsis, Sirs, stroke, bleed, dehydration, metabolic derangement, hypercapnia, hypoxia  I ordered, reviewed and interpreted labs, which included CBC with elevated white  count, chemistries with elevated glucose, lactate normal, COVID-negative, VBG with some CO2 retention and low oxygen level, blood culture sent, troponins mildly elevated need to be trended I ordered medication IV fluids IV antibiotics rectal Tylenol and reviewed PMP when indicated. I ordered imaging studies which included chest x-ray and I independently    visualized and interpreted imaging which showed probable multifocal pneumonia Additional history obtained from EMS and patient's sisters Previous records obtained and reviewed in epic including discharge summary from December I consulted [facility] hospitalist Dr. Arbutus Leas and discussed lab and imaging findings and discussed disposition.  Cardiac monitoring reviewed, normal sinus rhythm Social determinants considered, ongoing tobacco use Critical Interventions: Initiation of IV antibiotics and IV fluids per sepsis protocol.  After the interventions stated above, I reevaluated the patient and found patient's oxygenation to be stable and blood pressure also good.  He will be admitted to the hospital for further management Admission and further testing considered, patient will benefit from mission the hospital for further treatment.  Patient in agreement with plan.  Family updated.         Final Clinical Impression(s) / ED Diagnoses Final diagnoses:  Acute respiratory failure with hypoxia (HCC)  Community acquired pneumonia, unspecified laterality    Rx / DC Orders ED Discharge Orders     None         Terrilee Files, MD 11/25/22 1731

## 2022-11-25 NOTE — ED Notes (Signed)
Pt given a sprite at this time. 

## 2022-11-25 NOTE — Progress Notes (Signed)
Patient family stated that patient has not voided since being at the hospital.Bladder scan done and shows 220 mL. Notified Maryanne, CRN and Dr. Arbutus Leas. No new orders at this time.     11/25/22 1517  Urine Characteristics  Urinary Interventions Bladder scan  Bladder Scan Volume (mL) 220 mL

## 2022-11-25 NOTE — ED Triage Notes (Signed)
[facility] called out to [facility] for AMS and fever for this pt. Pt is having altered memory that different than usual. Per [facility] pt oxygen saturation was 78% RA and placed on 3L by the facility with no change. Upon EMS arrival pt was placed on a nonrebreather at 15% and oxygen saturation increased to 100%.

## 2022-11-25 NOTE — H&P (Signed)
History and Physical    Patient: Warren Sullivan VWU:[id number] DOB: 12/07/1960 DOA: 11/25/2022 DOS: the patient was seen and examined on 11/25/2022 PCP: Sherol Dade, DO  Patient coming from: SNF  Chief Complaint:  Chief Complaint  Patient presents with   Altered Mental Status   HPI: Warren Sullivan is a 62 year old with a history of COPD, CVA with left hemiparesis, dementia, hypertension, hyperlipidemia, GERD, BPH presenting from [facility] with hypoxia and possible altered mental status.  Apparently, the patient was noted by staff at [facility] to have some altered mentation.  His vitals were checked, and he was noted to have hypoxia.  Upon EMS arrival, patient had oxygen saturation of 78% on room air.  He was placed on nonrebreather initially,, and he was transported to the emergency department for further evaluation and treatment.  The patient himself denies any fever, chills, chest pain, shortness of breath, nausea, vomiting, diarrhea, abdominal pain, dysuria, hematuria.  He does have a chronic cough.  Patient's sisters are at the bedside to assist with history.  They state that the patient has smoked since he was 62 years old.  They were not able to clarify, she smoked, but they stated that "he smoked like a chimney."  The patient continues to smoke about 4 cigarettes/day. At the bedside, the patient's sisters state that his mental status is at baseline.  At baseline, the patient is wheelchair-bound.  He does not walk.  He requires assistance with transfers. In the ED, the patient was febrile up to 102.4 F with tachycardia 110-120.  He has soft blood pressures with SBP in the 100s.  Oxygen saturation is 94-95% on 3 L.  WBC 14.2, hemoglobin 15.1, platelets 1-40,000.  LFTs were unremarkable.  Sodium 137, potassium 3.5, bicarbonate 30, serum creatinine 0.90.  Chest x-ray showed bilateral infiltrates VBG showed 7.3/69/<31/32.  EKG showed sinus tachycardia with nonspecific T  wave changes.  BNP 36.  Lactic acid 1.6.  The patient was started on IV fluids and IV ceftriaxone and azithromycin.   Review of Systems: As mentioned in the history of present illness. All other systems reviewed and are negative. Past Medical History:  Diagnosis Date   AKI (acute kidney injury) (HCC) 08/18/2021   CVA (cerebral vascular accident) (HCC)    Dehydration 08/18/2021   Dementia in other diseases classified elsewhere, severe, without behavioral disturbance, psychotic disturbance, mood disturbance, and anxiety (HCC)    Fall at home, initial encounter 08/29/2021   GERD (gastroesophageal reflux disease)    Hemiplegia, unspecified affecting right dominant side (HCC)    Hyperlipidemia    Hypertension    MDD (major depressive disorder)    Pneumonia 08/28/2021   Small bowel obstruction (HCC) 08/17/2021   Stroke (HCC)    Vitamin D deficiency    Past Surgical History:  Procedure Laterality Date   BIOPSY  08/18/2021   Procedure: BIOPSY;  Surgeon: Dolores Frame, MD;  [facility]: [facility];  Service: Gastroenterology;;  Bonnielee Haff and distal esophagus biopsies    BIOPSY  10/03/2021   Procedure: BIOPSY;  Surgeon: Dolores Frame, MD;  Location: [facility];  Service: Gastroenterology;;   BIOPSY  01/30/2022   Procedure: BIOPSY;  Surgeon: Dolores Frame, MD;  Location: [facility];  Service: Gastroenterology;;   ESOPHAGOGASTRODUODENOSCOPY (EGD) WITH PROPOFOL N/A 08/18/2021   Surgeon: Marguerita Merles, Reuel Boom, MD; severe acute nonspecific esophagitis with ulceration and reactive hyperplasia, peptic duodenitis, acute chronic gastritis with H. pylori, 3 cm hiatal hernia.   ESOPHAGOGASTRODUODENOSCOPY (  EGD) WITH PROPOFOL N/A 10/03/2021   Surgeon: Marguerita Merles, Reuel Boom, MD; myocarditis, chronic gastritis with H. pylori, 2 cm hiatal hernia, erythematous duodenopathy.   ESOPHAGOGASTRODUODENOSCOPY (EGD) WITH PROPOFOL N/A 01/30/2022   Procedure:  ESOPHAGOGASTRODUODENOSCOPY (EGD) WITH PROPOFOL;  Surgeon: Dolores Frame, MD;  Location: [facility];  Service: Gastroenterology;  Laterality: N/A;   HERNIA REPAIR     Social History:  reports that he has been smoking cigarettes. He has been smoking an average of .25 packs per day. He does not have any smokeless tobacco history on file. He reports that he does not drink alcohol and does not use drugs.  No Known Allergies  Family History  Problem Relation Age of Onset   Hypertension Mother    Stroke Mother    Hypertension Father    Hypertension Sister     Prior to Admission medications   Medication Sig Start Date End Date Taking? Authorizing Provider  acetaminophen (TYLENOL) 650 MG CR tablet Take 1,300 mg by mouth every 8 (eight) hours as needed (general discomfort).    [provider]  albuterol (PROVENTIL) (2.5 MG/3ML) 0.083% nebulizer solution Take 3 mLs (2.5 mg total) by nebulization every 4 (four) hours as needed for wheezing or shortness of breath. 01/30/22   Johnson, Clanford L, MD  atorvastatin (LIPITOR) 20 MG tablet Take 1 tablet (20 mg total) by mouth at bedtime. 06/11/22   Glade Lloyd, MD  Calcium Carbonate (CALCIUM 500 PO) Take 1 tablet by mouth daily.    [provider]  Cholecalciferol 25 MCG (1000 UT) capsule Take 5,000 Units by mouth daily.    [provider]  cloNIDine (CATAPRES) 0.1 MG tablet Take 2 tablets (0.2 mg total) by mouth 2 (two) times daily. 08/20/21   Shon Hale, MD  gabapentin (NEURONTIN) 300 MG capsule Take 1 capsule (300 mg total) by mouth 3 (three) times daily. Start with one at bedtime for 1 week. Then two at bedtime for one week, then go to full dose. 10/19/15   Mechele Claude, MD  memantine (NAMENDA) 10 MG tablet Take 10 mg by mouth 2 (two) times daily. 07/24/21   [provider]  Multiple Vitamin (MULTIVITAMIN) tablet Take 1 tablet by mouth daily.    [provider]  ondansetron (ZOFRAN) 4 MG  tablet Take 4 mg by mouth every 8 (eight) hours as needed for nausea or vomiting. 02/26/22   [provider]  pantoprazole (PROTONIX) 40 MG tablet Take 1 tablet (40 mg total) by mouth 2 (two) times daily. 08/20/21 10/04/22  Shon Hale, MD  tamsulosin (FLOMAX) 0.4 MG CAPS capsule Take 0.4 mg by mouth daily. 07/11/21   [provider]  VASCEPA 1 g capsule Take 2 g by mouth 2 (two) times daily. 08/08/21   [provider]  Venlafaxine HCl 75 MG TB24 Take 75 mg by mouth daily.    [provider]  vitamin B-12 (CYANOCOBALAMIN) 500 MCG tablet Take 500 mcg by mouth daily.    [provider]    Physical Exam: Vitals:   11/25/22 1000 11/25/22 1017 11/25/22 1045 11/25/22 1051  BP: 118/72 121/71 104/65   Pulse: (!) 103 96 83   Resp: (!) 28 20 20    Temp:    (!) 101 F (38.3 C)  TempSrc:    Oral  SpO2: 95% 92% 92%   Weight:      Height:       GENERAL:  A&O x 2, NAD, well developed, cooperative, follows commands HEENT: [place]/AT, No  thrush, No icterus, No oral ulcers Neck:  No neck mass, No meningismus, soft, supple CV: RRR, no S3, no S4, no rub, no JVD Lungs: Diminished breath sounds bilateral.  Bilateral rales, left greater than right.  No wheezing Abd: soft/NT +BS, nondistended Ext: No edema, no lymphangitis, no cyanosis, no rashes Neuro:  CN II-XII intact, strength 4/5 in RUE, RLE, strength 4/-5 LUE, LLE; sensation intact bilateral; no dysmetria; babinski equivocal  Data Reviewed: Data reviewed above in the history  Assessment and Plan: Acute respiratory failure with hypoxia and hypercarbia -Secondary to pneumonia in the setting of COPD -Stable on 3 L -Wean oxygen for saturation greater 92%  Severe sepsis -Present on admission -Presented with fever, tachycardia, leukocytosis and respiratory failure -Secondary to pneumonia -Lactic acid peaked 1.6 -Check procalcitonin  Lobar pneumonia -Continue ceftriaxone and azithromycin  COPD -Start  DuoNebs -Patient has smoked since age 29  Mixed hyperlipidemia -Continue statin  Dementia without behavioral disturbance -Continue Namenda  Essential hypertension -Holding clonidine secondary to soft blood pressures  Depression/anxiety -Continue venlafaxine  BPH -Continue tamsulosin  Hyperglycemia -Check hemoglobin A1c     Advance Care Planning: FULL  Consults: none  Family Communication: sisters 5/19  Severity of Illness: The appropriate patient status for this patient is INPATIENT. Inpatient status is judged to be reasonable and necessary in order to provide the required intensity of service to ensure the patient's safety. The patient's presenting symptoms, physical exam findings, and initial radiographic and laboratory data in the context of their chronic comorbidities is felt to place them at high risk for further clinical deterioration. Furthermore, it is not anticipated that the patient will be medically stable for discharge from the hospital within 2 midnights of admission.   * I certify that at the point of admission it is my clinical judgment that the patient will require inpatient hospital care spanning beyond 2 midnights from the point of admission due to high intensity of service, high risk for further deterioration and high frequency of surveillance required.*  Author: Catarina Hartshorn, MD 11/25/2022 11:11 AM  For on call review [email or web address].

## 2022-11-25 NOTE — Progress Notes (Signed)
Patient arrived to room 313 around 1200. Yellow mews. Notified CN MaryAnne. Yellow mews protocol started.     11/25/22 1200  Assess: MEWS Score  Temp 99.7 F (37.6 C)  BP (!) 97/59  MAP (mmHg) 71  Pulse Rate 70  Resp (!) 22  SpO2 93 %  O2 Device Nasal Cannula  O2 Flow Rate (L/min) 2 L/min  Assess: MEWS Score  MEWS Temp 0  MEWS Systolic 1  MEWS Pulse 0  MEWS RR 1  MEWS LOC 0  MEWS Score 2  MEWS Score Color Yellow  Assess: if the MEWS score is Yellow or Red  Were vital signs taken at a resting state? Yes  Focused Assessment No change from prior assessment  Does the patient meet 2 or more of the SIRS criteria? No  MEWS guidelines implemented  Yes, yellow  Treat  MEWS Interventions Considered administering scheduled or prn medications/treatments as ordered  Take Vital Signs  Increase Vital Sign Frequency  Yellow: Q2hr x1, continue Q4hrs until patient remains green for 12hrs  Escalate  MEWS: Escalate Yellow: Discuss with charge nurse and consider notifying provider and/or RRT  Notify: Charge Nurse/RN  Name of Charge Nurse/RN Notified Audiological scientist, RN  Assess: SIRS CRITERIA  SIRS Temperature  0  SIRS Pulse 0  SIRS Respirations  1  SIRS WBC 0  SIRS Score Sum  1

## 2022-11-25 NOTE — Progress Notes (Signed)
[facility]link following for sepsis protocol. 

## 2022-11-26 ENCOUNTER — Inpatient Hospital Stay (HOSPITAL_COMMUNITY): Payer: Medicare Other

## 2022-11-26 DIAGNOSIS — R7989 Other specified abnormal findings of blood chemistry: Secondary | ICD-10-CM

## 2022-11-26 DIAGNOSIS — J9601 Acute respiratory failure with hypoxia: Secondary | ICD-10-CM

## 2022-11-26 DIAGNOSIS — F17200 Nicotine dependence, unspecified, uncomplicated: Secondary | ICD-10-CM | POA: Diagnosis not present

## 2022-11-26 DIAGNOSIS — J181 Lobar pneumonia, unspecified organism: Secondary | ICD-10-CM | POA: Diagnosis not present

## 2022-11-26 DIAGNOSIS — F0390 Unspecified dementia without behavioral disturbance: Secondary | ICD-10-CM | POA: Diagnosis not present

## 2022-11-26 LAB — BASIC METABOLIC PANEL
Anion gap: 7 (ref 5–15)
BUN: 21 mg/dL (ref 8–23)
CO2: 26 mmol/L (ref 22–32)
Calcium: 8.4 mg/dL — ABNORMAL LOW (ref 8.9–10.3)
Chloride: 103 mmol/L (ref 98–111)
Creatinine, Ser: 0.86 mg/dL (ref 0.61–1.24)
GFR, Estimated: >60 mL/min (ref >60)
Glucose, Bld: 114 mg/dL — ABNORMAL HIGH (ref 70–99)
Potassium: 3.7 mmol/L (ref 3.5–5.1)
Sodium: 136 mmol/L (ref 135–145)

## 2022-11-26 LAB — ECHOCARDIOGRAM COMPLETE
AR max vel: 2.19 cm2
AV Area VTI: 1.97 cm2
AV Area mean vel: 2.25 cm2
AV Mean grad: 3.4 mmHg
AV Peak grad: 7 mmHg
Ao pk vel: 1.32 m/s
Area-P 1/2: 3.39 cm2
Est EF: >75
Height: 72 in
MV M vel: 0.86 m/s
MV Peak grad: 3 mmHg
S' Lateral: 3.3 cm
Weight: 3806.02 oz

## 2022-11-26 LAB — CBC
HCT: 35.2 % — ABNORMAL LOW (ref 39.0–52.0)
Hemoglobin: 11.6 g/dL — ABNORMAL LOW (ref 13.0–17.0)
MCH: 29.5 pg (ref 26.0–34.0)
MCHC: 33 g/dL (ref 30.0–36.0)
MCV: 89.6 fL (ref 80.0–100.0)
Platelets: 135 10*3/uL — ABNORMAL LOW (ref 150–400)
RBC: 3.93 MIL/uL — ABNORMAL LOW (ref 4.22–5.81)
RDW: 13.5 % (ref 11.5–15.5)
WBC: 18.5 10*3/uL — ABNORMAL HIGH (ref 4.0–10.5)
nRBC: 0 % (ref 0.0–0.2)

## 2022-11-26 LAB — HIV ANTIBODY (ROUTINE TESTING W REFLEX): HIV Screen 4th Generation wRfx: NONREACTIVE

## 2022-11-26 MED ORDER — HYDRALAZINE HCL 20 MG/ML IJ SOLN
10.0000 mg | Freq: Four times a day (QID) | INTRAMUSCULAR | Status: DC | PRN
  Administered 2022-11-26 – 2022-12-01 (×4): 10 mg via INTRAVENOUS
  Filled 2022-11-26 (×4): qty 1

## 2022-11-26 NOTE — TOC Initial Note (Signed)
Transition of Care [facility]) - Initial/Assessment Note    Patient Details  Name: Warren Sullivan MRN: [id number] Date of Birth: February 14, 1961  Transition of Care [facility]) CM/SW Contact:    Annice Needy, LCSW Phone Number: 11/26/2022, 10:15 AM  Clinical Narrative:                 Patient is a LTC resident at [facility]. Admitted for Acute respiratory failure with hypoxia and hypercarbia. Expected to d/c back to facility on Wednesday.   Expected Discharge Plan: Skilled Nursing Facility Barriers to Discharge: No Barriers Identified   Patient Goals and CMS Choice Patient states their goals for this hospitalization and ongoing recovery are:: patient is LTC at [facility]          Expected Discharge Plan and Services     Post Acute Care Choice: Skilled Nursing Facility Living arrangements for the past 2 months: Skilled Nursing Facility                                      Prior Living Arrangements/Services Living arrangements for the past 2 months: Skilled Nursing Facility Lives with:: Facility Resident          Need for Family Participation in Patient Care: Yes (Comment) Care giver support system in place?: Yes (comment)   Criminal Activity/Legal Involvement Pertinent to Current Situation/Hospitalization: No - Comment as needed  Activities of Daily Living Home Assistive Devices/Equipment: None ADL Screening (condition at time of admission) Patient's cognitive ability adequate to safely complete daily activities?: No Is the patient deaf or have difficulty hearing?: No Does the patient have difficulty seeing, even when wearing glasses/contacts?: No Does the patient have difficulty concentrating, remembering, or making decisions?: No Patient able to express need for assistance with ADLs?: No Does the patient have difficulty dressing or bathing?: Yes Independently performs ADLs?: No Communication: Independent Dressing (OT): Needs assistance Is this a change  from baseline?: Pre-admission baseline Grooming: Needs assistance Is this a change from baseline?: Pre-admission baseline Feeding: Independent Bathing: Needs assistance Is this a change from baseline?: Pre-admission baseline Toileting: Needs assistance Is this a change from baseline?: Pre-admission baseline In/Out Bed: Dependent Is this a change from baseline?: Pre-admission baseline Walks in Home: Dependent Is this a change from baseline?: Pre-admission baseline Does the patient have difficulty walking or climbing stairs?: Yes Weakness of Legs: Both Weakness of Arms/Hands: None  Permission Sought/Granted Permission sought to share information with : Family Supports    Share Information with NAME: Derrill Kay, sister           Emotional Assessment       Orientation: : Oriented to Self Alcohol / Substance Use: Not Applicable Psych Involvement: No (comment)  Admission diagnosis:  Acute respiratory failure with hypoxia and hypercarbia (HCC) [J96.01, J96.02] Patient Active Problem List   Diagnosis Date Noted   Acute respiratory failure with hypoxia and hypercarbia (HCC) 11/25/2022   Sepsis due to undetermined organism (HCC) 11/25/2022   Lobar pneumonia (HCC) 11/25/2022   Palliative care encounter 09/03/2022   COVID-19 virus infection 07/08/2022   Aneurysm of ascending aorta without rupture (HCC) 06/22/2022   Esophagitis 06/09/2022   Hiatal hernia 06/09/2022   Abnormal CT of the chest 06/09/2022   GI bleed 06/09/2022   Stroke (HCC) 06/08/2022   Hemiplegia, unspecified affecting right dominant side (HCC) 06/08/2022   Emesis, persistent 01/29/2022   Helicobacter pylori duodenitis 11/06/2021  Hypokalemia 08/18/2021   Hyponatremia 08/18/2021   Hypocalcemia 08/18/2021   Hyperglycemia 08/18/2021   Vomiting 08/18/2021   GERD (gastroesophageal reflux disease) 08/18/2021   Hyperlipidemia, unspecified 08/18/2021   Dementia without behavioral disturbance (HCC) 08/18/2021    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 [place] [address] Phone: [phone] Fax: [phone]     Social Determinants of Health (SDOH) Social History: SDOH Screenings   Food Insecurity: No Food Insecurity (11/25/2022)  Housing: Patient Declined (11/25/2022)  Transportation Needs: No Transportation Needs (11/25/2022)  Utilities: Not At Risk (11/25/2022)  Tobacco Use: High Risk (11/25/2022)   SDOH Interventions:     Readmission Risk Interventions     No data to display

## 2022-11-26 NOTE — Progress Notes (Signed)
Pt receiving breathing treatment.

## 2022-11-26 NOTE — Progress Notes (Signed)
  Echocardiogram 2D Echocardiogram has been performed.  Maren Reamer 11/26/2022, 10:58 AM

## 2022-11-26 NOTE — [place] FL2 (Signed)
[place] MEDICAID FL2 LEVEL OF CARE FORM     IDENTIFICATION  Patient Name: Warren Sullivan Birthdate: [birth year] Sex: male Admission Date (Current Location): 11/25/2022  [address] and [place] Number:  Aaron Edelman [id number] K Facility and Address:  [facility],  [address] [place] [address]      Provider Number: [id number]  Attending Physician Name and Address:  Catarina Hartshorn, MD  Relative Name and Phone Number:  Derrill Kay (Sister) [phone]    Current Level of Care: Hospital Recommended Level of Care: Skilled Nursing Facility Prior Approval Number:    Date Approved/Denied:   PASRR Number:    Discharge Plan: SNF    Current Diagnoses: Patient Active Problem List   Diagnosis Date Noted   Acute respiratory failure with hypoxia and hypercarbia (HCC) 11/25/2022   Sepsis due to undetermined organism (HCC) 11/25/2022   Lobar pneumonia (HCC) 11/25/2022   Palliative care encounter 09/03/2022   COVID-19 virus infection 07/08/2022   Aneurysm of ascending aorta without rupture (HCC) 06/22/2022   Esophagitis 06/09/2022   Hiatal hernia 06/09/2022   Abnormal CT of the chest 06/09/2022   GI bleed 06/09/2022   Stroke (HCC) 06/08/2022   Hemiplegia, unspecified affecting right dominant side (HCC) 06/08/2022   Emesis, persistent 01/29/2022   Helicobacter pylori duodenitis 11/06/2021   Hypokalemia 08/18/2021   Hyponatremia 08/18/2021   Hypocalcemia 08/18/2021   Hyperglycemia 08/18/2021   Vomiting 08/18/2021   GERD (gastroesophageal reflux disease) 08/18/2021   Hyperlipidemia, unspecified 08/18/2021   Dementia without behavioral disturbance (HCC) 08/18/2021   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 10/19/2015   Tobacco dependence 06/21/2015   Essential hypertension 06/14/2015    Orientation RESPIRATION BLADDER Height & Weight     Self  Normal External catheter Weight: 237 lb 14 oz (107.9 kg) Height:  6' (182.9 cm)  BEHAVIORAL SYMPTOMS/MOOD  NEUROLOGICAL BOWEL NUTRITION STATUS      Incontinent Diet (heart healthy)  AMBULATORY STATUS COMMUNICATION OF NEEDS Skin   Extensive Assist Verbally Normal                       Personal Care Assistance Level of Assistance  Bathing, Feeding, Dressing Bathing Assistance: Maximum assistance Feeding assistance: Independent Dressing Assistance: Maximum assistance     Functional Limitations Info  Sight, Hearing, Speech Sight Info: Adequate Hearing Info: Adequate Speech Info: Adequate    SPECIAL CARE FACTORS FREQUENCY        PT Frequency: n/a              Contractures Contractures Info: Not present    Additional Factors Info  Code Status, Allergies Code Status Info: Full code Allergies Info: NKA           Current Medications (11/26/2022):  This is the current hospital active medication list Current Facility-Administered Medications  Medication Dose Route Frequency Provider Last Rate Last Admin   acetaminophen (TYLENOL) tablet 650 mg  650 mg Oral Q6H PRN Tat, David, MD       Or   acetaminophen (TYLENOL) suppository 650 mg  650 mg Rectal Q6H PRN Tat, Onalee Hua, MD       arformoterol (BROVANA) nebulizer solution 15 mcg  15 mcg Nebulization BID Tat, Onalee Hua, MD   15 mcg at 11/26/22 0841   atorvastatin (LIPITOR) tablet 20 mg  20 mg Oral QHS Catarina Hartshorn, MD   20 mg at 11/25/22 2126   azithromycin (ZITHROMAX) 500 mg in sodium chloride 0.9 % 250 mL IVPB  500 mg Intravenous Q24H  Catarina Hartshorn, MD 250 mL/hr at 11/26/22 1004 500 mg at 11/26/22 1004   budesonide (PULMICORT) nebulizer solution 0.5 mg  0.5 mg Nebulization BID Tat, David, MD   0.5 mg at 11/26/22 0836   cefTRIAXone (ROCEPHIN) 2 g in sodium chloride 0.9 % 100 mL IVPB  2 g Intravenous Q24H Catarina Hartshorn, MD 200 mL/hr at 11/26/22 0924 2 g at 11/26/22 1610   cyanocobalamin (VITAMIN B12) tablet 500 mcg  500 mcg Oral Daily Tat, David, MD   500 mcg at 11/26/22 0915   enoxaparin (LOVENOX) injection 50 mg  50 mg Subcutaneous Q24H Tat,  Onalee Hua, MD   50 mg at 11/25/22 1200   ipratropium-albuterol (DUONEB) 0.5-2.5 (3) MG/3ML nebulizer solution 3 mL  3 mL Nebulization TID Tat, Onalee Hua, MD   3 mL at 11/26/22 0834   memantine (NAMENDA) tablet 10 mg  10 mg Oral BID Tat, Onalee Hua, MD   10 mg at 11/26/22 9604   multivitamin with minerals tablet 1 tablet  1 tablet Oral Daily Tat, David, MD   1 tablet at 11/26/22 0916   ondansetron (ZOFRAN) tablet 4 mg  4 mg Oral Q6H PRN Tat, Onalee Hua, MD       Or   ondansetron [facility]) injection 4 mg  4 mg Intravenous Q6H PRN Tat, Onalee Hua, MD       tamsulosin [facility]) capsule 0.4 mg  0.4 mg Oral Daily Tat, David, MD   0.4 mg at 11/26/22 0915   venlafaxine XR (EFFEXOR-XR) 24 hr capsule 75 mg  75 mg Oral Daily Madueme, Elvira C, RPH   75 mg at 11/26/22 5409     Discharge Medications: Please see discharge summary for a list of discharge medications.  Relevant Imaging Results:  Relevant Lab Results:   Additional Information    Sondi Desch, Juleen China, LCSW

## 2022-11-26 NOTE — Progress Notes (Signed)
PROGRESS NOTE  Warren Sullivan ZOX:[id number] DOB: [birth year] DOA: 11/25/2022 PCP: Sherol Dade, DO  Brief History:  62 year old with a history of COPD, CVA with left hemiparesis, dementia, hypertension, hyperlipidemia, GERD, BPH presenting from [facility] with hypoxia and possible altered mental status.  Apparently, the patient was noted by staff at [facility] to have some altered mentation.  His vitals were checked, and he was noted to have hypoxia.  Upon EMS arrival, patient had oxygen saturation of 78% on room air.  He was placed on nonrebreather initially,, and he was transported to the emergency department for further evaluation and treatment.  The patient himself denies any fever, chills, chest pain, shortness of breath, nausea, vomiting, diarrhea, abdominal pain, dysuria, hematuria.  He does have a chronic cough.  Patient's sisters are at the bedside to assist with history.  They state that the patient has smoked since he was 62 years old.  They were not able to clarify, she smoked, but they stated that "he smoked like a chimney."  The patient continues to smoke about 4 cigarettes/day. At the bedside, the patient's sisters state that his mental status is at baseline.  At baseline, the patient is wheelchair-bound.  He does not walk.  He requires assistance with transfers. In the ED, the patient was febrile up to 102.4 F with tachycardia 110-120.  He has soft blood pressures with SBP in the 100s.  Oxygen saturation is 94-95% on 3 L.  WBC 14.2, hemoglobin 15.1, platelets 1-40,000.  LFTs were unremarkable.  Sodium 137, potassium 3.5, bicarbonate 30, serum creatinine 0.90.  Chest x-ray showed bilateral infiltrates VBG showed 7.3/69/<31/32.  EKG showed sinus tachycardia with nonspecific T wave changes.  BNP 36.  Lactic acid 1.6.  The patient was started on IV fluids and IV ceftriaxone and azithromycin.   Assessment/Plan:  Acute respiratory failure with hypoxia and  hypercarbia -Secondary to pneumonia in the setting of COPD -Stable on 3 L -Wean oxygen for saturation greater 92% -11/26/22--weaned to RA   Severe sepsis -Present on admission -Presented with fever, tachycardia, leukocytosis and respiratory failure -Secondary to pneumonia -Lactic acid peaked 1.6 -Check procalcitonin 0.57   Lobar pneumonia -Continue ceftriaxone and azithromycin   COPD -Start DuoNebs -Patient has smoked since age 36   Mixed hyperlipidemia -Continue statin   Dementia without behavioral disturbance -Continue Namenda   Essential hypertension -Holding clonidine secondary to soft blood pressures   Depression/anxiety -Continue venlafaxine   BPH -Continue tamsulosin   Hyperglycemia/Impaired glucose tolerance -Check hemoglobin A1c--5.8     Family Communication:   sister updated 5/20  Consultants:  none  Code Status:  FULL  DVT Prophylaxis:  SCDs   Procedures: As Listed in Progress Note Above  Antibiotics: Ceftriaxone 5/19>> Azithro 5/19>>      Subjective: Patient states he is breathing better.  Had one episode of vomiting due to excess cough.  Denies f/c, cp, n/v/d, abd pain  Objective: Vitals:   11/26/22 0838 11/26/22 0841 11/26/22 1440 11/26/22 1445  BP:   134/65 (!) 163/97  Pulse:   79 78  Resp:   18 20  Temp:   98.3 F (36.8 C) 98.5 F (36.9 C)  TempSrc:   Oral Oral  SpO2: 94% 98% 91% 96%  Weight:      Height:        Intake/Output Summary (Last 24 hours) at 11/26/2022 1755 Last data filed at 11/26/2022 1740 Gross per 24 hour  Intake 1331.26  ml  Output 700 ml  Net 631.26 ml   Weight change:  Exam:  General:  Pt is alert, follows commands appropriately, not in acute distress HEENT: No icterus, No thrush, No neck mass, [place]bins/AT Cardiovascular: RRR, S1/S2, no rubs, no gallops Respiratory: diminished BS.  Bibasilar rales.  Minimal basilar wheeze Abdomen: Soft/+BS, non tender, non distended, no guarding Extremities: No  edema, No lymphangitis, No petechiae, No rashes, no synovitis   Data Reviewed: I have personally reviewed following labs and imaging studies Basic Metabolic Panel: Recent Labs  Lab 11/25/22 0931 11/26/22 0537  NA 137 136  K 3.5 3.7  CL 96* 103  CO2 30 26  GLUCOSE 202* 114*  BUN 8 21  CREATININE 0.90 0.86  CALCIUM 8.5* 8.4*   Liver Function Tests: Recent Labs  Lab 11/25/22 0931  AST 20  ALT 21  ALKPHOS 71  BILITOT 0.7  PROT 7.5  ALBUMIN 4.2   No results for input(s): "LIPASE", "AMYLASE" in the last 168 hours. No results for input(s): "AMMONIA" in the last 168 hours. Coagulation Profile: Recent Labs  Lab 11/25/22 0931  INR 1.0   CBC: Recent Labs  Lab 11/25/22 0931 11/26/22 0537  WBC 14.2* 18.5*  NEUTROABS 12.8*  --   HGB 15.1 11.6*  HCT 45.7 35.2*  MCV 90.5 89.6  PLT 148* 135*   Cardiac Enzymes: No results for input(s): "CKTOTAL", "CKMB", "CKMBINDEX", "TROPONINI" in the last 168 hours. BNP: Invalid input(s): "POCBNP" CBG: No results for input(s): "GLUCAP" in the last 168 hours. HbA1C: Recent Labs    11/25/22 1206  HGBA1C 5.8*   Urine analysis:    Component Value Date/Time   COLORURINE YELLOW 06/08/2022 1742   APPEARANCEUR CLEAR 06/08/2022 1742   LABSPEC 1.026 06/08/2022 1742   PHURINE 6.0 06/08/2022 1742   GLUCOSEU NEGATIVE 06/08/2022 1742   HGBUR SMALL (A) 06/08/2022 1742   BILIRUBINUR NEGATIVE 06/08/2022 1742   KETONESUR 5 (A) 06/08/2022 1742   PROTEINUR 100 (A) 06/08/2022 1742   UROBILINOGEN 0.2 02/19/2013 1857   NITRITE NEGATIVE 06/08/2022 1742   LEUKOCYTESUR NEGATIVE 06/08/2022 1742   Sepsis Labs: @LABRCNTIP (procalcitonin:4,lacticidven:4) ) Recent Results (from the past 240 hour(s))  Blood Culture (routine x 2)     Status: None (Preliminary result)   Collection Time: 11/25/22  9:31 AM   Specimen: Left Antecubital; Blood  Result Value Ref Range Status   Specimen Description   Final    LEFT ANTECUBITAL BOTTLES DRAWN AEROBIC AND  ANAEROBIC   Special Requests Blood Culture adequate volume  Final   Culture   Final    NO GROWTH <12 HOURS Performed at [facility], [address], [place], [place] [address]    Report Status PENDING  Incomplete  Blood Culture (routine x 2)     Status: None (Preliminary result)   Collection Time: 11/25/22  9:31 AM   Specimen: BLOOD  Result Value Ref Range Status   Specimen Description BLOOD  Final   Special Requests NONE  Final   Culture   Final    NO GROWTH <12 HOURS Performed at [facility], [address], [place], [place] [address]    Report Status PENDING  Incomplete  SARS Coronavirus 2 by RT PCR (hospital order, performed in [facility] hospital lab) *cepheid single result test* Anterior Nasal Swab     Status: None   Collection Time: 11/25/22  9:31 AM   Specimen: Anterior Nasal Swab  Result Value Ref Range Status   SARS Coronavirus 2 by RT PCR NEGATIVE  NEGATIVE Final    Comment: (NOTE) SARS-CoV-2 target nucleic acids are NOT DETECTED.  The SARS-CoV-2 RNA is generally detectable in upper and lower respiratory specimens during the acute phase of infection. The lowest concentration of SARS-CoV-2 viral copies this assay can detect is 250 copies / mL. A negative result does not preclude SARS-CoV-2 infection and should not be used as the sole basis for treatment or other patient management decisions.  A negative result may occur with improper specimen collection / handling, submission of specimen other than nasopharyngeal swab, presence of viral mutation(s) within the areas targeted by this assay, and inadequate number of viral copies (<250 copies / mL). A negative result must be combined with clinical observations, patient history, and epidemiological information.  Fact Sheet for Patients:   RoadLapTop.co.za  Fact Sheet for Healthcare Providers: http://kim-miller.com/  This test is not yet approved or  cleared by the [place] FDA and has been authorized for detection and/or diagnosis of SARS-CoV-2 by FDA under an Emergency Use Authorization (EUA).  This EUA will remain in effect (meaning this test can be used) for the duration of the COVID-19 declaration under Section 564(b)(1) of the Act, 21 U.S.C. section 360bbb-3(b)(1), unless the authorization is terminated or revoked sooner.  Performed at [facility], [address], [place], [place] [address]   MRSA Next Gen by PCR, Nasal     Status: None   Collection Time: 11/25/22  1:15 PM   Specimen: Nasal Mucosa; Nasal Swab  Result Value Ref Range Status   MRSA by PCR Next Gen NOT DETECTED NOT DETECTED Final    Comment: (NOTE) The GeneXpert MRSA Assay (FDA approved for NASAL specimens only), is one component of a comprehensive MRSA colonization surveillance program. It is not intended to diagnose MRSA infection nor to guide or monitor treatment for MRSA infections. Test performance is not FDA approved in patients less than 60 years old. Performed at [facility], [address], [place], [place] [address]      Scheduled Meds:  arformoterol  15 mcg Nebulization BID   atorvastatin  20 mg Oral QHS   budesonide (PULMICORT) nebulizer solution  0.5 mg Nebulization BID   cyanocobalamin  500 mcg Oral Daily   enoxaparin (LOVENOX) injection  50 mg Subcutaneous Q24H   ipratropium-albuterol  3 mL Nebulization TID   memantine  10 mg Oral BID   multivitamin with minerals  1 tablet Oral Daily   tamsulosin  0.4 mg Oral Daily   venlafaxine XR  75 mg Oral Daily   Continuous Infusions:  azithromycin Stopped (11/26/22 1104)   cefTRIAXone (ROCEPHIN)  IV Stopped (11/26/22 0954)    Procedures/Studies: ECHOCARDIOGRAM COMPLETE  Result Date: 11/26/2022    ECHOCARDIOGRAM REPORT   Patient Name:   Warren Sullivan Date of Exam: 11/26/2022 Medical Rec #:  [id number]         Height:       72.0 in Accession #:    [id number]        Weight:       237.9 lb Date of Birth:   26-Dec-1960         BSA:          2.294 m Patient Age:    61 years          BP:           111/71 mmHg Patient Gender: M                 HR:  88 bpm. [facility] Procedure: 2D Echo, Cardiac Doppler and Color Doppler Indications:     Elevated Troponin  History:         Patient has no prior history of Echocardiogram examinations.                  Sepsis and Stroke, Signs/Symptoms:Altered Mental Status; Risk                  Factors:Current Smoker, Hypertension and Dyslipidemia.  Sonographer:     Aron Baba Referring Phys:  [phone] Lawrnce Reyez Diagnosing Phys: Dina Rich MD  Sonographer Comments: Technically difficult study due to poor echo windows and no subcostal window. Image acquisition challenging due to uncooperative patient. IMPRESSIONS  1. Left ventricular ejection fraction, by estimation, is >75%. The left ventricle has hyperdynamic function. The left ventricle has no regional wall motion abnormalities. Left ventricular diastolic parameters are consistent with Grade I diastolic dysfunction (impaired relaxation).  2. RV not well visualized. Grossly appears normal in size and function. Indeterminate PASP, IVC poorly visualized. . Right ventricular systolic function was not well visualized. The right ventricular size is not well visualized.  3. The mitral valve was not well visualized. No evidence of mitral valve regurgitation. No evidence of mitral stenosis.  4. The tricuspid valve is abnormal.  5. The aortic valve was not well visualized. Aortic valve regurgitation is not visualized. No aortic stenosis is present.  6. Limited visualization of the ascending aortic aneurysm, consider additional imaging with CTA or MRA. Marland Kitchen Aortic dilatation noted. There is mild dilatation of the aortic root, measuring 42 mm. 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 FINDINGS  Left Ventricle: Left ventricular ejection fraction, by estimation, is >75%. The left ventricle  has hyperdynamic function. The left ventricle has no regional wall motion abnormalities. The left ventricular internal cavity size was normal in size. There is no left ventricular hypertrophy. Left ventricular diastolic parameters are consistent with Grade I diastolic dysfunction (impaired relaxation). Normal left ventricular filling pressure. Right Ventricle: RV not well visualized. Grossly appears normal in size and function. Indeterminate PASP, IVC poorly visualized. The right ventricular size is not well visualized. Right vetricular wall thickness was not well visualized. Right ventricular  systolic function was not well visualized. Left Atrium: Left atrial size was normal in size. Right Atrium: Right atrial size was normal in size. Pericardium: There is no evidence of pericardial effusion. Mitral Valve: The mitral valve was not well visualized. No evidence of mitral valve regurgitation. No evidence of mitral valve stenosis. Tricuspid Valve: The tricuspid valve is abnormal. Tricuspid valve regurgitation is mild . No evidence of tricuspid stenosis. Aortic Valve: The aortic valve was not well visualized. Aortic valve regurgitation is not visualized. No aortic stenosis is present. Aortic valve mean gradient measures 3.4 mmHg. Aortic valve peak gradient measures 7.0 mmHg. Aortic valve area, by VTI measures 1.97 cm. Pulmonic Valve: The pulmonic valve was not well visualized. Pulmonic valve regurgitation is not visualized. No evidence of pulmonic stenosis. Aorta: Limited visualization of the ascending aortic aneurysm, consider additional imaging with CTA or MRA. Aortic dilatation noted. There is mild dilatation of the aortic root, measuring 42 mm. 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 Venous: The inferior vena cava was not well visualized. IAS/Shunts: The interatrial septum was not well visualized.  LEFT VENTRICLE PLAX 2D LVIDd:         4.50 cm  Diastology LVIDs:          3.30 cm   LV e' medial:    6.96 cm/s LV PW:         1.00 cm   LV E/e' medial:  10.0 LV IVS:        1.00 cm   LV e' lateral:   9.03 cm/s LVOT diam:     1.90 cm   LV E/e' lateral: 7.7 LV SV:         54 LV SV Index:   23 LVOT Area:     2.84 cm  RIGHT VENTRICLE RV S prime:     20.80 cm/s LEFT ATRIUM           Index        RIGHT ATRIUM           Index LA diam:      4.60 cm 2.01 cm/m   RA Area:     27.40 cm LA Vol (A2C): 37.6 ml 16.39 ml/m  RA Volume:   95.60 ml  41.68 ml/m LA Vol (A4C): 60.8 ml 26.51 ml/m  AORTIC VALVE AV Area (Vmax):    2.19 cm AV Area (Vmean):   2.25 cm AV Area (VTI):     1.97 cm AV Vmax:           131.84 cm/s AV Vmean:          83.772 cm/s AV VTI:            0.272 m AV Peak Grad:      7.0 mmHg AV Mean Grad:      3.4 mmHg LVOT Vmax:         102.00 cm/s LVOT Vmean:        66.500 cm/s LVOT VTI:          0.189 m LVOT/AV VTI ratio: 0.69  AORTA Ao Root diam: 4.20 cm Ao Asc diam:  5.00 cm MITRAL VALVE                TRICUSPID VALVE MV Area (PHT): 3.39 cm     TR Peak grad:   19.2 mmHg MV Decel Time: 224 msec     TR Vmax:        219.00 cm/s MR Peak grad: 3.0 mmHg MR Vmax:      86.00 cm/s    SHUNTS MV E velocity: 69.80 cm/s   Systemic VTI:  0.19 m MV A velocity: 102.00 cm/s  Systemic Diam: 1.90 cm MV E/A ratio:  0.68 Dina Rich MD Electronically signed by Dina Rich MD Signature Date/Time: 11/26/2022/12:40:08 PM    Final (Updated)    DG Chest Port 1 View  Result Date: 11/25/2022 CLINICAL DATA:  62 year old male with history of altered mental status. Possible sepsis. EXAM: PORTABLE CHEST 1 VIEW COMPARISON:  Chest x-ray 06/08/2022. FINDINGS: Opacity at the left base which may reflect areas of atelectasis and/or consolidation, with superimposed small left pleural effusion. Diffuse interstitial prominence with widespread peribronchial cuffing. Ill-defined opacity in the right mid lung. No pneumothorax. No evidence of pulmonary edema. Heart size appears borderline enlarged. Moderate hiatal  hernia. Mediastinal contours are distorted by patient positioning. Atherosclerotic calcifications are noted in the thoracic aorta. IMPRESSION: 1. Diffuse interstitial prominence and peribronchial cuffing, concerning for an acute bronchitis. Ill-defined opacity in the right mid lung opacity at the left lung base concerning for developing multilobar bronchopneumonia. 2. Small left pleural effusion. Electronically Signed   By: Trudie Reed M.D.   On: 11/25/2022  09:44    Catarina Hartshorn, DO  [facility]  If 7PM-7AM, please contact night-coverage [email or web address] Password [email or web address] 11/26/2022, 5:55 PM   LOS: 1 day

## 2022-11-26 NOTE — Progress Notes (Signed)
Patient had two episodes of projectile vomiting last night. Looked dark brown with coffee grounds. Vitals taken after each episode, SpO2 94% clear lung sounds. Dr. Thomes Dinning and charge nurse notified. Unable to get sample for the occult bld gastric/ duodenum.

## 2022-11-27 ENCOUNTER — Inpatient Hospital Stay (HOSPITAL_COMMUNITY): Payer: Medicare Other

## 2022-11-27 DIAGNOSIS — R1110 Vomiting, unspecified: Secondary | ICD-10-CM

## 2022-11-27 DIAGNOSIS — J9602 Acute respiratory failure with hypercapnia: Secondary | ICD-10-CM | POA: Diagnosis not present

## 2022-11-27 DIAGNOSIS — K920 Hematemesis: Secondary | ICD-10-CM | POA: Diagnosis not present

## 2022-11-27 DIAGNOSIS — A419 Sepsis, unspecified organism: Secondary | ICD-10-CM | POA: Diagnosis not present

## 2022-11-27 DIAGNOSIS — F0390 Unspecified dementia without behavioral disturbance: Secondary | ICD-10-CM | POA: Diagnosis not present

## 2022-11-27 DIAGNOSIS — J9601 Acute respiratory failure with hypoxia: Secondary | ICD-10-CM | POA: Diagnosis not present

## 2022-11-27 DIAGNOSIS — F17200 Nicotine dependence, unspecified, uncomplicated: Secondary | ICD-10-CM | POA: Diagnosis not present

## 2022-11-27 LAB — CBC
HCT: 38.2 % — ABNORMAL LOW (ref 39.0–52.0)
Hemoglobin: 12.9 g/dL — ABNORMAL LOW (ref 13.0–17.0)
MCH: 29.7 pg (ref 26.0–34.0)
MCHC: 33.8 g/dL (ref 30.0–36.0)
MCV: 87.8 fL (ref 80.0–100.0)
Platelets: 144 10*3/uL — ABNORMAL LOW (ref 150–400)
RBC: 4.35 MIL/uL (ref 4.22–5.81)
RDW: 13.4 % (ref 11.5–15.5)
WBC: 11.4 10*3/uL — ABNORMAL HIGH (ref 4.0–10.5)
nRBC: 0 % (ref 0.0–0.2)

## 2022-11-27 LAB — HEMOGLOBIN AND HEMATOCRIT, BLOOD
HCT: 39.3 % (ref 39.0–52.0)
Hemoglobin: 13.2 g/dL (ref 13.0–17.0)

## 2022-11-27 LAB — BASIC METABOLIC PANEL
Anion gap: 9 (ref 5–15)
BUN: 17 mg/dL (ref 8–23)
CO2: 26 mmol/L (ref 22–32)
Calcium: 8.5 mg/dL — ABNORMAL LOW (ref 8.9–10.3)
Chloride: 101 mmol/L (ref 98–111)
Creatinine, Ser: 0.83 mg/dL (ref 0.61–1.24)
GFR, Estimated: >60 mL/min (ref >60)
Glucose, Bld: 158 mg/dL — ABNORMAL HIGH (ref 70–99)
Potassium: 3.7 mmol/L (ref 3.5–5.1)
Sodium: 136 mmol/L (ref 135–145)

## 2022-11-27 LAB — CULTURE, BLOOD (ROUTINE X 2)
Culture: NO GROWTH
Culture: NO GROWTH

## 2022-11-27 LAB — OCCULT BLOOD GASTRIC / DUODENUM (SPECIMEN CUP)
Occult Blood, Gastric: POSITIVE — AB
pH, Gastric: 3

## 2022-11-27 LAB — TROPONIN I (HIGH SENSITIVITY): Troponin I (High Sensitivity): 198 ng/L (ref <18)

## 2022-11-27 LAB — MAGNESIUM: Magnesium: 1.9 mg/dL (ref 1.7–2.4)

## 2022-11-27 LAB — PROCALCITONIN: Procalcitonin: 0.98 ng/mL

## 2022-11-27 MED ORDER — POTASSIUM CHLORIDE IN NACL 20-0.9 MEQ/L-% IV SOLN: INTRAVENOUS | Status: AC

## 2022-11-27 MED ORDER — METOCLOPRAMIDE HCL 5 MG/ML IJ SOLN
5.0000 mg | Freq: Four times a day (QID) | INTRAMUSCULAR | Status: DC
  Administered 2022-11-27 – 2022-12-01 (×16): 5 mg via INTRAVENOUS
  Filled 2022-11-27 (×16): qty 2

## 2022-11-27 MED ORDER — LABETALOL HCL 5 MG/ML IV SOLN
5.0000 mg | Freq: Four times a day (QID) | INTRAVENOUS | Status: DC | PRN
  Administered 2022-11-28 – 2022-12-01 (×2): 5 mg via INTRAVENOUS
  Filled 2022-11-27 (×2): qty 4

## 2022-11-27 MED ORDER — ONDANSETRON HCL 4 MG/2ML IJ SOLN
4.0000 mg | Freq: Four times a day (QID) | INTRAMUSCULAR | Status: DC
  Administered 2022-11-27 – 2022-12-01 (×16): 4 mg via INTRAVENOUS
  Filled 2022-11-27 (×17): qty 2

## 2022-11-27 MED ORDER — PANTOPRAZOLE SODIUM 40 MG IV SOLR
40.0000 mg | Freq: Two times a day (BID) | INTRAVENOUS | Status: DC
  Administered 2022-11-27 – 2022-12-01 (×9): 40 mg via INTRAVENOUS
  Filled 2022-11-27 (×9): qty 10

## 2022-11-27 NOTE — Progress Notes (Signed)
PROGRESS NOTE  Warren Sullivan GEX:[id number] DOB: 1961/01/22 DOA: 11/25/2022 PCP: Sherol Dade, DO  Brief History:  62 year old with a history of COPD, CVA with left hemiparesis, dementia, hypertension, hyperlipidemia, GERD, BPH presenting from [facility] with hypoxia and possible altered mental status.  Apparently, the patient was noted by staff at [facility] to have some altered mentation.  His vitals were checked, and he was noted to have hypoxia.  Upon EMS arrival, patient had oxygen saturation of 78% on room air.  He was placed on nonrebreather initially,, and he was transported to the emergency department for further evaluation and treatment.  The patient himself denies any fever, chills, chest pain, shortness of breath, nausea, vomiting, diarrhea, abdominal pain, dysuria, hematuria.  He does have a chronic cough.  Patient's sisters are at the bedside to assist with history.  They state that the patient has smoked since he was 62 years old.  They were not able to clarify, she smoked, but they stated that "he smoked like a chimney."  The patient continues to smoke about 4 cigarettes/day. At the bedside, the patient's sisters state that his mental status is at baseline.  At baseline, the patient is wheelchair-bound.  He does not walk.  He requires assistance with transfers. In the ED, the patient was febrile up to 102.4 F with tachycardia 110-120.  He has soft blood pressures with SBP in the 100s.  Oxygen saturation is 94-95% on 3 L.  WBC 14.2, hemoglobin 15.1, platelets 1-40,000.  LFTs were unremarkable.  Sodium 137, potassium 3.5, bicarbonate 30, serum creatinine 0.90.  Chest x-ray showed bilateral infiltrates VBG showed 7.3/69/<31/32.  EKG showed sinus tachycardia with nonspecific T wave changes.  BNP 36.  Lactic acid 1.6.  The patient was started on IV fluids and IV ceftriaxone and azithromycin.   Assessment/Plan:  Acute respiratory failure with hypoxia and  hypercarbia -Secondary to pneumonia in the setting of COPD -Stable on 3 L -Wean oxygen for saturation greater 92% -11/26/22--weaned to RA but back on oxygen since starting vomiting   Severe sepsis -Present on admission -Presented with fever, tachycardia, leukocytosis and respiratory failure -Secondary to pneumonia -Lactic acid peaked 1.6 -Check procalcitonin 0.57  Intractable vomiting -11/26/22--multiple emesis, some with coffee grounds, pulled out NG -CT abd/ pelvis -start zofran -start pantoprazole -GI consult   Lobar pneumonia -Continue ceftriaxone and azithromycin   COPD -Start DuoNebs -Patient has smoked since age 55   Mixed hyperlipidemia -Continue statin   Dementia without behavioral disturbance -Continue Namenda   Essential hypertension -Holding clonidine secondary to soft blood pressures initially -now cannot tolerate due to emesis   Depression/anxiety -Continue venlafaxine   BPH -Continue tamsulosin   Hyperglycemia/Impaired glucose tolerance -Check hemoglobin A1c--5.8         Family Communication:   sister updated 5/21   Consultants:  none   Code Status:  FULL   DVT Prophylaxis:  SCDs     Procedures: As Listed in Progress Note Above   Antibiotics: Ceftriaxone 5/19>> Azithro 5/19>>         Subjective: Patient had multiple emesis.  Complains of abd pain.  Denies f/c, cp, sob, diarrhea  Objective: Vitals:   11/27/22 0803 11/27/22 0805 11/27/22 0807 11/27/22 0900  BP:    (!) 162/93  Pulse:    100  Resp:    20  Temp:    98.5 F (36.9 C)  TempSrc:    Oral  SpO2: 90% 94% 95%  92%  Weight:      Height:        Intake/Output Summary (Last 24 hours) at 11/27/2022 0921 Last data filed at 11/27/2022 0354 Gross per 24 hour  Intake 350 ml  Output 360 ml  Net -10 ml   Weight change:  Exam:  General:  Pt is alert, follows commands appropriately, not in acute distress HEENT: No icterus, No thrush, No neck mass,  [place]/AT Cardiovascular: RRR, S1/S2, no rubs, no gallops Respiratory: bibasilar rales.  No wheeze Abdomen: Soft/+BS, diffusely tender, non distended, no guarding Extremities: No edema, No lymphangitis, No petechiae, No rashes, no synovitis   Data Reviewed: I have personally reviewed following labs and imaging studies Basic Metabolic Panel: Recent Labs  Lab 11/25/22 0931 11/26/22 0537 11/27/22 0438  NA 137 136 136  K 3.5 3.7 3.7  CL 96* 103 101  CO2 30 26 26   GLUCOSE 202* 114* 158*  BUN 8 21 17   CREATININE 0.90 0.86 0.83  CALCIUM 8.5* 8.4* 8.5*  MG  --   --  1.9   Liver Function Tests: Recent Labs  Lab 11/25/22 0931  AST 20  ALT 21  ALKPHOS 71  BILITOT 0.7  PROT 7.5  ALBUMIN 4.2   No results for input(s): "LIPASE", "AMYLASE" in the last 168 hours. No results for input(s): "AMMONIA" in the last 168 hours. Coagulation Profile: Recent Labs  Lab 11/25/22 0931  INR 1.0   CBC: Recent Labs  Lab 11/25/22 0931 11/26/22 0537 11/27/22 0438  WBC 14.2* 18.5* 11.4*  NEUTROABS 12.8*  --   --   HGB 15.1 11.6* 12.9*  HCT 45.7 35.2* 38.2*  MCV 90.5 89.6 87.8  PLT 148* 135* 144*   Cardiac Enzymes: No results for input(s): "CKTOTAL", "CKMB", "CKMBINDEX", "TROPONINI" in the last 168 hours. BNP: Invalid input(s): "POCBNP" CBG: No results for input(s): "GLUCAP" in the last 168 hours. HbA1C: Recent Labs    11/25/22 1206  HGBA1C 5.8*   Urine analysis:    Component Value Date/Time   COLORURINE YELLOW 06/08/2022 1742   APPEARANCEUR CLEAR 06/08/2022 1742   LABSPEC 1.026 06/08/2022 1742   PHURINE 6.0 06/08/2022 1742   GLUCOSEU NEGATIVE 06/08/2022 1742   HGBUR SMALL (A) 06/08/2022 1742   BILIRUBINUR NEGATIVE 06/08/2022 1742   KETONESUR 5 (A) 06/08/2022 1742   PROTEINUR 100 (A) 06/08/2022 1742   UROBILINOGEN 0.2 02/19/2013 1857   NITRITE NEGATIVE 06/08/2022 1742   LEUKOCYTESUR NEGATIVE 06/08/2022 1742   Sepsis  Labs: @LABRCNTIP (procalcitonin:4,lacticidven:4) ) Recent Results (from the past 240 hour(s))  Blood Culture (routine x 2)     Status: None (Preliminary result)   Collection Time: 11/25/22  9:31 AM   Specimen: Left Antecubital; Blood  Result Value Ref Range Status   Specimen Description   Final    LEFT ANTECUBITAL BOTTLES DRAWN AEROBIC AND ANAEROBIC   Special Requests Blood Culture adequate volume  Final   Culture   Final    NO GROWTH 2 DAYS Performed at [facility], [address] [place], [place] [address]    Report Status PENDING  Incomplete  Blood Culture (routine x 2)     Status: None (Preliminary result)   Collection Time: 11/25/22  9:31 AM   Specimen: BLOOD  Result Value Ref Range Status   Specimen Description BLOOD  Final   Special Requests NONE  Final   Culture   Final    NO GROWTH 2 DAYS Performed at [facility], [address], [place], [place] [address]  Report Status PENDING  Incomplete  SARS Coronavirus 2 by RT PCR (hospital order, performed in [facility] hospital lab) *cepheid single result test* Anterior Nasal Swab     Status: None   Collection Time: 11/25/22  9:31 AM   Specimen: Anterior Nasal Swab  Result Value Ref Range Status   SARS Coronavirus 2 by RT PCR NEGATIVE NEGATIVE Final    Comment: (NOTE) SARS-CoV-2 target nucleic acids are NOT DETECTED.  The SARS-CoV-2 RNA is generally detectable in upper and lower respiratory specimens during the acute phase of infection. The lowest concentration of SARS-CoV-2 viral copies this assay can detect is 250 copies / mL. A negative result does not preclude SARS-CoV-2 infection and should not be used as the sole basis for treatment or other patient management decisions.  A negative result may occur with improper specimen collection / handling, submission of specimen other than nasopharyngeal swab, presence of viral mutation(s) within the areas targeted by this assay, and inadequate number of viral copies (<250  copies / mL). A negative result must be combined with clinical observations, patient history, and epidemiological information.  Fact Sheet for Patients:   RoadLapTop.co.za  Fact Sheet for Healthcare Providers: http://kim-miller.com/  This test is not yet approved or  cleared by the [place] FDA and has been authorized for detection and/or diagnosis of SARS-CoV-2 by FDA under an Emergency Use Authorization (EUA).  This EUA will remain in effect (meaning this test can be used) for the duration of the COVID-19 declaration under Section 564(b)(1) of the Act, 21 U.S.C. section 360bbb-3(b)(1), unless the authorization is terminated or revoked sooner.  Performed at [facility], [address], [place], [place] [address]   MRSA Next Gen by PCR, Nasal     Status: None   Collection Time: 11/25/22  1:15 PM   Specimen: Nasal Mucosa; Nasal Swab  Result Value Ref Range Status   MRSA by PCR Next Gen NOT DETECTED NOT DETECTED Final    Comment: (NOTE) The GeneXpert MRSA Assay (FDA approved for NASAL specimens only), is one component of a comprehensive MRSA colonization surveillance program. It is not intended to diagnose MRSA infection nor to guide or monitor treatment for MRSA infections. Test performance is not FDA approved in patients less than 4 years old. Performed at [facility], [address], [place], [place] [address]      Scheduled Meds:  arformoterol  15 mcg Nebulization BID   atorvastatin  20 mg Oral QHS   budesonide (PULMICORT) nebulizer solution  0.5 mg Nebulization BID   cyanocobalamin  500 mcg Oral Daily   enoxaparin (LOVENOX) injection  50 mg Subcutaneous Q24H   ipratropium-albuterol  3 mL Nebulization TID   memantine  10 mg Oral BID   metoCLOPramide (REGLAN) injection  5 mg Intravenous Q6H   multivitamin with minerals  1 tablet Oral Daily   ondansetron (ZOFRAN) IV  4 mg Intravenous Q6H   pantoprazole (PROTONIX) IV  40 mg  Intravenous Q12H   tamsulosin  0.4 mg Oral Daily   venlafaxine XR  75 mg Oral Daily   Continuous Infusions:  azithromycin Stopped (11/26/22 1104)   cefTRIAXone (ROCEPHIN)  IV 2 g (11/27/22 0857)    Procedures/Studies: DG Chest Port 1 View  Result Date: 11/27/2022 CLINICAL DATA:  Nasogastric tube placement EXAM: PORTABLE CHEST 1 VIEW COMPARISON:  11/25/2022, CT 06/08/2022 FINDINGS: Moderate hiatal hernia. Accounting for this, nasogastric tube tip is within the a mid body of the stomach. Proximal side hole is likely within the  proximal body of the stomach at the diaphragmatic hiatus peer visualized lung bases are clear. Cardiac size within normal limits. IMPRESSION: 1. Nasogastric tube tip within the mid body of the stomach. Electronically Signed   By: Helyn Numbers M.D.   On: 11/27/2022 04:03   ECHOCARDIOGRAM COMPLETE  Result Date: 11/26/2022    ECHOCARDIOGRAM REPORT   Patient Name:   Warren Sullivan Date of Exam: 11/26/2022 Medical Rec #:  [id number]         Height:       72.0 in Accession #:    [id number]        Weight:       237.9 lb Date of Birth:  [birth year]         BSA:          2.294 m Patient Age:    61 years          BP:           111/71 mmHg Patient Gender: M                 HR:           88 bpm. [facility] Procedure: 2D Echo, Cardiac Doppler and Color Doppler Indications:     Elevated Troponin  History:         Patient has no prior history of Echocardiogram examinations.                  Sepsis and Stroke, Signs/Symptoms:Altered Mental Status; Risk                  Factors:Current Smoker, Hypertension and Dyslipidemia.  Sonographer:     Aron Baba Referring Phys:  [phone] Ladarious Kresse Diagnosing Phys: Dina Rich MD  Sonographer Comments: Technically difficult study due to poor echo windows and no subcostal window. Image acquisition challenging due to uncooperative patient. IMPRESSIONS  1. Left ventricular ejection fraction, by estimation, is >75%. The left ventricle has  hyperdynamic function. The left ventricle has no regional wall motion abnormalities. Left ventricular diastolic parameters are consistent with Grade I diastolic dysfunction (impaired relaxation).  2. RV not well visualized. Grossly appears normal in size and function. Indeterminate PASP, IVC poorly visualized. . Right ventricular systolic function was not well visualized. The right ventricular size is not well visualized.  3. The mitral valve was not well visualized. No evidence of mitral valve regurgitation. No evidence of mitral stenosis.  4. The tricuspid valve is abnormal.  5. The aortic valve was not well visualized. Aortic valve regurgitation is not visualized. No aortic stenosis is present.  6. Limited visualization of the ascending aortic aneurysm, consider additional imaging with CTA or MRA. Marland Kitchen Aortic dilatation noted. There is mild dilatation of the aortic root, measuring 42 mm. 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 FINDINGS  Left Ventricle: Left ventricular ejection fraction, by estimation, is >75%. The left ventricle has hyperdynamic function. The left ventricle has no regional wall motion abnormalities. The left ventricular internal cavity size was normal in size. There is no left ventricular hypertrophy. Left ventricular diastolic parameters are consistent with Grade I diastolic dysfunction (impaired relaxation). Normal left ventricular filling pressure. Right Ventricle: RV not well visualized. Grossly appears normal in size and function. Indeterminate PASP, IVC poorly visualized. The right ventricular size is not well visualized. Right vetricular wall thickness was not well visualized. Right ventricular  systolic function was not well visualized. Left Atrium: Left  atrial size was normal in size. Right Atrium: Right atrial size was normal in size. Pericardium: There is no evidence of pericardial effusion. Mitral Valve: The mitral valve was not well visualized.  No evidence of mitral valve regurgitation. No evidence of mitral valve stenosis. Tricuspid Valve: The tricuspid valve is abnormal. Tricuspid valve regurgitation is mild . No evidence of tricuspid stenosis. Aortic Valve: The aortic valve was not well visualized. Aortic valve regurgitation is not visualized. No aortic stenosis is present. Aortic valve mean gradient measures 3.4 mmHg. Aortic valve peak gradient measures 7.0 mmHg. Aortic valve area, by VTI measures 1.97 cm. Pulmonic Valve: The pulmonic valve was not well visualized. Pulmonic valve regurgitation is not visualized. No evidence of pulmonic stenosis. Aorta: Limited visualization of the ascending aortic aneurysm, consider additional imaging with CTA or MRA. Aortic dilatation noted. There is mild dilatation of the aortic root, measuring 42 mm. 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 Venous: The inferior vena cava was not well visualized. IAS/Shunts: The interatrial septum was not well visualized.  LEFT VENTRICLE PLAX 2D LVIDd:         4.50 cm   Diastology LVIDs:         3.30 cm   LV e' medial:    6.96 cm/s LV PW:         1.00 cm   LV E/e' medial:  10.0 LV IVS:        1.00 cm   LV e' lateral:   9.03 cm/s LVOT diam:     1.90 cm   LV E/e' lateral: 7.7 LV SV:         54 LV SV Index:   23 LVOT Area:     2.84 cm  RIGHT VENTRICLE RV S prime:     20.80 cm/s LEFT ATRIUM           Index        RIGHT ATRIUM           Index LA diam:      4.60 cm 2.01 cm/m   RA Area:     27.40 cm LA Vol (A2C): 37.6 ml 16.39 ml/m  RA Volume:   95.60 ml  41.68 ml/m LA Vol (A4C): 60.8 ml 26.51 ml/m  AORTIC VALVE AV Area (Vmax):    2.19 cm AV Area (Vmean):   2.25 cm AV Area (VTI):     1.97 cm AV Vmax:           131.84 cm/s AV Vmean:          83.772 cm/s AV VTI:            0.272 m AV Peak Grad:      7.0 mmHg AV Mean Grad:      3.4 mmHg LVOT Vmax:         102.00 cm/s LVOT Vmean:        66.500 cm/s LVOT VTI:          0.189 m LVOT/AV VTI ratio:  0.69  AORTA Ao Root diam: 4.20 cm Ao Asc diam:  5.00 cm MITRAL VALVE                TRICUSPID VALVE MV Area (PHT): 3.39 cm     TR Peak grad:   19.2 mmHg MV Decel Time: 224 msec     TR Vmax:        219.00 cm/s MR Peak grad: 3.0 mmHg MR Vmax:  86.00 cm/s    SHUNTS MV E velocity: 69.80 cm/s   Systemic VTI:  0.19 m MV A velocity: 102.00 cm/s  Systemic Diam: 1.90 cm MV E/A ratio:  0.68 Dina Rich MD Electronically signed by Dina Rich MD Signature Date/Time: 11/26/2022/12:40:08 PM    Final (Updated)    DG Chest Port 1 View  Result Date: 11/25/2022 CLINICAL DATA:  62 year old male with history of altered mental status. Possible sepsis. EXAM: PORTABLE CHEST 1 VIEW COMPARISON:  Chest x-ray 06/08/2022. FINDINGS: Opacity at the left base which may reflect areas of atelectasis and/or consolidation, with superimposed small left pleural effusion. Diffuse interstitial prominence with widespread peribronchial cuffing. Ill-defined opacity in the right mid lung. No pneumothorax. No evidence of pulmonary edema. Heart size appears borderline enlarged. Moderate hiatal hernia. Mediastinal contours are distorted by patient positioning. Atherosclerotic calcifications are noted in the thoracic aorta. IMPRESSION: 1. Diffuse interstitial prominence and peribronchial cuffing, concerning for an acute bronchitis. Ill-defined opacity in the right mid lung opacity at the left lung base concerning for developing multilobar bronchopneumonia. 2. Small left pleural effusion. Electronically Signed   By: Trudie Reed M.D.   On: 11/25/2022 09:44    Catarina Hartshorn, DO  [facility]  If 7PM-7AM, please contact night-coverage [email or web address] Password [email or web address] 11/27/2022, 9:21 AM   LOS: 2 days

## 2022-11-27 NOTE — Consult Note (Addendum)
@LOGO @   Referring Provider: [facility] hospitalist Primary Care Physician:  Sherol Dade, DO Primary Gastroenterologist:  Dr. Levon Hedger  Date of Admission: 11/25/2022 Date of Consultation: 11/27/2022  Reason for Consultation: Coffee-ground emesis  HPI:  Warren Sullivan is a 62 y.o. year old male with a history of COPD, CVA with left hemiparesis, dementia, hypertension, hyperlipidemia, BPH, GERD, ulcerative esophagitis, H. pylori gastritis treated with bismuth quadruple therapy followed by levofloxacin salvage therapy with eradication on EGD in July 2023, duodenitis, who presented from [facility] with hypoxia and possible altered mental status.  ED course: Febrile up to 102.4 F with tachycardia 110-120. He has soft blood pressures with SBP in the 100s. Oxygen saturation is 94-95% on 3 L.  WBC 14.2, hemoglobin 15.1, platelets 148,000. LFTs were unremarkable. Sodium 137, potassium 3.5, bicarbonate 30, serum creatinine 0.90.  Chest x-ray showed bilateral infiltrates  Lactic acid 1.6.  The patient was started on IV fluids and IV ceftriaxone and azithromycin, and admitted under hospitalist service.    Overnight between 5/19 - 5/20, patient developed vomiting that was dark with coffee grounds.  Ultimately, NG tube was placed early morning of 5/21 due to ongoing coffee-ground emesis.  Gastric occult blood was positive.  Hemoglobin initially 15.1 on day of admission, 11.6 yesterday, 12.9 today.  He was started on IV PPI BID, IV Reglan every 6 hours, and CT A/P without contrast was ordered this morning which just resulted and shows mildly dilated loops of small bowel in the central abdomen, favoring small bowel enteritis or partial small bowel obstruction, moderate hiatal hernia, cholelithiasis without cholecystitis, continue nonobstructing left upper pole renal calculus.    Consult:  Patient is difficult historian due to dementia.  Though he was still at [facility]. Not aware of why  he is in the hospital. He was reoriented.  Let him know that he was here due to pneumonia.  Also let him know that he been having some vomiting recently.  He reports he did vomit this morning, but denies any nausea or vomiting since that time.  He does not remember having vomiting prior to today or any before coming to the hospital.  Reports occasional heartburn symptoms, but nothing routine.  Denies abdominal pain, dysphagia.  Denies NSAID use.  Denies BRBPR or melena.  States he has bowel movements every few days.  Reports he had a bowel movement yesterday, but nothing recorded in the chart.  Spoke with nurse at [facility]. She reports no nausea, vomiting, coffee ground emesis, hematemesis, brbpr, or melena there. Has been taking Protonix BID for GERD.   Called patient's sister, Warren Sullivan to discuss. She also reports patient has been doing well without any nausea or vomiting until he was hospitalized.  She is agreeable to patient having procedures if needed.  Past Medical History:  Diagnosis Date   AKI (acute kidney injury) (HCC) 08/18/2021   CVA (cerebral vascular accident) (HCC)    Dehydration 08/18/2021   Dementia in other diseases classified elsewhere, severe, without behavioral disturbance, psychotic disturbance, mood disturbance, and anxiety (HCC)    Fall at home, initial encounter 08/29/2021   GERD (gastroesophageal reflux disease)    Hemiplegia, unspecified affecting right dominant side (HCC)    Hyperlipidemia    Hypertension    MDD (major depressive disorder)    Pneumonia 08/28/2021   Small bowel obstruction (HCC) 08/17/2021   Stroke (HCC)    Vitamin D deficiency     Past Surgical History:  Procedure Laterality Date   BIOPSY  08/18/2021   Procedure: BIOPSY;  Surgeon: Dolores Frame, MD;  [facility];  Service: Gastroenterology;;  Bonnielee Haff and distal esophagus biopsies    BIOPSY  10/03/2021   Procedure: BIOPSY;  Surgeon: Dolores Frame, MD;  [facility]: [facility];  Service: Gastroenterology;;   BIOPSY  01/30/2022   Procedure: BIOPSY;  Surgeon: Dolores Frame, MD;  [facility];  Service: Gastroenterology;;   ESOPHAGOGASTRODUODENOSCOPY (EGD) WITH PROPOFOL N/A 08/18/2021   Surgeon: Marguerita Merles, Reuel Boom, MD; severe acute nonspecific esophagitis with ulceration and reactive hyperplasia, peptic duodenitis, acute chronic gastritis with H. pylori, 3 cm hiatal hernia.   ESOPHAGOGASTRODUODENOSCOPY (EGD) WITH PROPOFOL N/A 10/03/2021   Surgeon: Marguerita Merles, Reuel Boom, MD; myocarditis, chronic gastritis with H. pylori, 2 cm hiatal hernia, erythematous duodenopathy.   ESOPHAGOGASTRODUODENOSCOPY (EGD) WITH PROPOFOL N/A 01/30/2022   Procedure: ESOPHAGOGASTRODUODENOSCOPY (EGD) WITH PROPOFOL;  Surgeon: Dolores Frame, MD;  [facility];  Service: Gastroenterology;  Laterality: N/A;   HERNIA REPAIR      Prior to Admission medications   Medication Sig Start Date End Date Taking? Authorizing Provider  acetaminophen (TYLENOL) 650 MG CR tablet Take 1,300 mg by mouth every 8 (eight) hours as needed (general discomfort).   Yes [provider]  albuterol (PROVENTIL) (2.5 MG/3ML) 0.083% nebulizer solution Take 3 mLs (2.5 mg total) by nebulization every 4 (four) hours as needed for wheezing or shortness of breath. 01/30/22  Yes Johnson, Clanford L, MD  atorvastatin (LIPITOR) 20 MG tablet Take 1 tablet (20 mg total) by mouth at bedtime. 06/11/22  Yes Glade Lloyd, MD  Calcium Carbonate (CALCIUM 500 PO) Take 1 tablet by mouth daily.   Yes [provider]  Cholecalciferol 25 MCG (1000 UT) capsule Take 5,000 Units by mouth daily.   Yes [provider]  cloNIDine (CATAPRES) 0.1 MG tablet Take 2 tablets (0.2 mg total) by mouth 2 (two) times daily. 08/20/21  Yes Emokpae, Courage, MD  gabapentin (NEURONTIN) 300 MG capsule Take 1 capsule (300 mg total) by mouth 3 (three) times  daily. Start with one at bedtime for 1 week. Then two at bedtime for one week, then go to full dose. 10/19/15  Yes Stacks, Broadus John, MD  memantine (NAMENDA) 10 MG tablet Take 10 mg by mouth 2 (two) times daily. 07/24/21  Yes [provider]  Multiple Vitamin (MULTIVITAMIN) tablet Take 1 tablet by mouth daily.   Yes [provider]  ondansetron (ZOFRAN) 4 MG tablet Take 4 mg by mouth every 8 (eight) hours as needed for nausea or vomiting. 02/26/22  Yes [provider]  pantoprazole (PROTONIX) 40 MG tablet Take 1 tablet (40 mg total) by mouth 2 (two) times daily. 08/20/21 11/25/22 Yes Emokpae, Courage, MD  tamsulosin (FLOMAX) 0.4 MG CAPS capsule Take 0.4 mg by mouth daily. 07/11/21  Yes [provider]  VASCEPA 1 g capsule Take 2 g by mouth 2 (two) times daily. 08/08/21  Yes [provider]  Venlafaxine HCl 75 MG TB24 Take 75 mg by mouth daily.   Yes [provider]  vitamin B-12 (CYANOCOBALAMIN) 500 MCG tablet Take 500 mcg by mouth daily. Patient not taking: Reported on 11/25/2022    [provider]    Current Facility-Administered Medications  Medication Dose Route Frequency Provider Last Rate Last Admin   0.9 % NaCl with KCl 20 mEq/ L  infusion   Intravenous Continuous Tat, David, MD 75 mL/hr at 11/27/22 1040 New Bag at 11/27/22 1040   acetaminophen (  TYLENOL) tablet 650 mg  650 mg Oral Q6H PRN Tat, David, MD       Or   acetaminophen (TYLENOL) suppository 650 mg  650 mg Rectal Q6H PRN Tat, Onalee Hua, MD       arformoterol (BROVANA) nebulizer solution 15 mcg  15 mcg Nebulization BID Tat, David, MD   15 mcg at 11/27/22 0806   atorvastatin (LIPITOR) tablet 20 mg  20 mg Oral Maura Crandall, MD   20 mg at 11/26/22 2051   azithromycin (ZITHROMAX) 500 mg in sodium chloride 0.9 % 250 mL IVPB  500 mg Intravenous Q24H Tat, Onalee Hua, MD 250 mL/hr at 11/27/22 1040 500 mg at 11/27/22 1040   budesonide (PULMICORT) nebulizer solution 0.5 mg  0.5 mg Nebulization BID  Tat, David, MD   0.5 mg at 11/27/22 0805   cefTRIAXone (ROCEPHIN) 2 g in sodium chloride 0.9 % 100 mL IVPB  2 g Intravenous Q24H Catarina Hartshorn, MD 200 mL/hr at 11/27/22 0857 2 g at 11/27/22 0857   cyanocobalamin (VITAMIN B12) tablet 500 mcg  500 mcg Oral Daily Tat, David, MD   500 mcg at 11/27/22 0849   enoxaparin (LOVENOX) injection 50 mg  50 mg Subcutaneous Q24H Tat, Onalee Hua, MD   50 mg at 11/26/22 1615   hydrALAZINE (APRESOLINE) injection 10 mg  10 mg Intravenous Q6H PRN Adefeso, Oladapo, DO   10 mg at 11/26/22 2103   ipratropium-albuterol (DUONEB) 0.5-2.5 (3) MG/3ML nebulizer solution 3 mL  3 mL Nebulization TID Tat, Onalee Hua, MD   3 mL at 11/27/22 0801   memantine (NAMENDA) tablet 10 mg  10 mg Oral BID Tat, Onalee Hua, MD   10 mg at 11/27/22 0850   metoCLOPramide (REGLAN) injection 5 mg  5 mg Intravenous Q6H Tat, Onalee Hua, MD       multivitamin with minerals tablet 1 tablet  1 tablet Oral Daily Tat, David, MD   1 tablet at 11/27/22 0849   ondansetron (ZOFRAN) tablet 4 mg  4 mg Oral Q6H PRN Tat, Onalee Hua, MD       Or   ondansetron [facility]) injection 4 mg  4 mg Intravenous Q6H PRN Tat, Onalee Hua, MD   4 mg at 11/27/22 0255   ondansetron (ZOFRAN) injection 4 mg  4 mg Intravenous Q6H Tat, Onalee Hua, MD       pantoprazole (PROTONIX) injection 40 mg  40 mg Intravenous Pablo Ledger, MD   40 mg at 11/27/22 1038   tamsulosin (FLOMAX) capsule 0.4 mg  0.4 mg Oral Daily Tat, David, MD   0.4 mg at 11/27/22 0849   venlafaxine XR (EFFEXOR-XR) 24 hr capsule 75 mg  75 mg Oral Daily Madueme, Elvira C, RPH   75 mg at 11/27/22 0850    Allergies as of 11/25/2022   (No Known Allergies)    Family History  Problem Relation Age of Onset   Hypertension Mother    Stroke Mother    Hypertension Father    Hypertension Sister     Social History   Socioeconomic History   Marital status: Divorced    Spouse name: Not on file   Number of children: Not on file   Years of education: Not on file   Highest education level: Not on file   Occupational History   Not on file  Tobacco Use   Smoking status: Every Day    Packs/day: .25    Types: Cigarettes   Smokeless tobacco: Not on file  Vaping Use   Vaping Use: Never used  Substance and Sexual Activity   Alcohol use: No   Drug use: No   Sexual activity: Not on file  Other Topics Concern   Not on file  Social History Narrative   Not on file   Social Determinants of Health   Financial Resource Strain: Not on file  Food Insecurity: No Food Insecurity (11/25/2022)   Hunger Vital Sign    Worried About Running Out of Food in the Last Year: Never true    Ran Out of Food in the Last Year: Never true  Transportation Needs: No Transportation Needs (11/25/2022)   PRAPARE - Administrator, Civil Service (Medical): No    Lack of Transportation (Non-Medical): No  Physical Activity: Not on file  Stress: Not on file  Social Connections: Not on file  Intimate Partner Violence: Not At Risk (11/25/2022)   Humiliation, Afraid, Rape, and Kick questionnaire    Fear of Current or Ex-Partner: No    Emotionally Abused: No    Physically Abused: No    Sexually Abused: No    Review of Systems: Gen: Denies fever, chills, cold or flulike symptoms, presyncope, syncope. CV: Denies chest pain, heart palpitations. Resp: Denies shortness of breath. GI: See HPI Heme: See HPI  Physical Exam: Vital signs in last 24 hours: Temp:  [98.3 F (36.8 C)-99 F (37.2 C)] 98.5 F (36.9 C) (05/21 0900) Pulse Rate:  [78-100] 100 (05/21 0900) Resp:  [18-20] 20 (05/21 0900) BP: (134-181)/(65-114) 162/93 (05/21 0900) SpO2:  [90 %-96 %] 92 % (05/21 0900) Last BM Date : 11/26/22 General:   Alert,  Well-developed, well-nourished, pleasant and cooperative in NAD Head:  Normocephalic and atraumatic. Eyes:  Sclera clear, no icterus.   Conjunctiva pink. Ears:  Normal auditory acuity. Lungs:  Clear to auscultation anteriorly.   No wheezes, crackles, or rhonchi. No acute distress.  On 2.5 L  nasal cannula. Heart:  Regular rate and rhythm; no murmurs, clicks, rubs,  or gallops. Abdomen: Full, but fairly soft.  Patient reports mild generalized tenderness.  Bowel sounds are hypoactive.  No masses, hepatosplenomegaly or hernias noted. Without guarding, and without rebound.   Rectal:  Deferred  Msk:  Symmetrical without gross deformities. Normal posture. Extremities:  Without edema. Neurologic:  Alert and  oriented x4;  grossly normal neurologically. Skin:  Intact without significant lesions or rashes. Psych:  Normal mood and affect.  Intake/Output from previous day: 05/20 0701 - 05/21 0700 In: 350 [IV Piggyback:350] Out: 360 [Urine:300; Emesis/NG output:60] Intake/Output this shift: No intake/output data recorded.  Lab Results: Recent Labs    11/25/22 0931 11/26/22 0537 11/27/22 0438  WBC 14.2* 18.5* 11.4*  HGB 15.1 11.6* 12.9*  HCT 45.7 35.2* 38.2*  PLT 148* 135* 144*   BMET Recent Labs    11/25/22 0931 11/26/22 0537 11/27/22 0438  NA 137 136 136  K 3.5 3.7 3.7  CL 96* 103 101  CO2 30 26 26   GLUCOSE 202* 114* 158*  BUN 8 21 17   CREATININE 0.90 0.86 0.83  CALCIUM 8.5* 8.4* 8.5*   LFT Recent Labs    11/25/22 0931  PROT 7.5  ALBUMIN 4.2  AST 20  ALT 21  ALKPHOS 71  BILITOT 0.7   PT/INR Recent Labs    11/25/22 0931  LABPROT 13.8  INR 1.0    Studies/Results: CT ABDOMEN PELVIS WO CONTRAST  Result Date: 11/27/2022 CLINICAL DATA:  Altered mental status, vomiting EXAM: CT ABDOMEN AND PELVIS WITHOUT CONTRAST TECHNIQUE: Multidetector CT imaging of the  abdomen and pelvis was performed following the standard protocol without IV contrast. RADIATION DOSE REDUCTION: This exam was performed according to the departmental dose-optimization program which includes automated exposure control, adjustment of the mA and/or kV according to patient size and/or use of iterative reconstruction technique. COMPARISON:  06/08/2022 FINDINGS: Lower chest: Scarring in the  bilateral lung bases with chronic left pleural calcifications. Hepatobiliary: Unenhanced liver is unremarkable. Layering tiny gallstones (series 2/image 25), without associated inflammatory changes. No intrahepatic or extrahepatic duct dilatation. Pancreas: Within normal limits. Spleen: Within normal limits. Adrenals/Urinary Tract: Adrenal glands are within normal limits. 2 mm nonobstructing left upper pole renal calculus (series 2/image 33). 15 mm simple left lower pole renal cyst (series 2/image 43), benign (Bosniak I). No follow-up is recommended. Right kidney is within normal limits. No hydronephrosis. Bladder is within normal limits. Stomach/Bowel: Stomach is notable for a moderate hiatal hernia. Mildly prominent/dilated loops of small bowel in the anterior mid abdomen with mild (series 2/image 46) with mild jejunal mesentery stranding/trace interloop fluid (series 2/image 46). Proximally, the stomach and duodenum are not dilated. As such, this appearance favors small bowel enteritis over partial small bowel obstruction. No pneumatosis or free air. Normal appendix (series 2/image 47). Scattered left colonic diverticulosis, without evidence of diverticulitis. Vascular/Lymphatic: No evidence of abdominal aortic aneurysm. Atherosclerotic calcifications of the abdominal aorta and branch vessels. No suspicious abdominopelvic lymphadenopathy. Reproductive: Prostate is unremarkable. Other: No abdominopelvic ascites. Musculoskeletal: Degenerative changes of the visualized thoracolumbar spine. Mild superior endplate changes with small node deformity at T12. IMPRESSION: Mildly dilated loops of small bowel in the central abdomen, favoring small bowel enteritis over partial small bowel obstruction. No pneumatosis or free air. Moderate hiatal hernia. 2 mm nonobstructing left upper pole renal calculus. No hydronephrosis. Cholelithiasis, without associated inflammatory changes. Electronically Signed   By: Charline Bills  M.D.   On: 11/27/2022 11:01   DG Chest Port 1 View  Result Date: 11/27/2022 CLINICAL DATA:  Nasogastric tube placement EXAM: PORTABLE CHEST 1 VIEW COMPARISON:  11/25/2022, CT 06/08/2022 FINDINGS: Moderate hiatal hernia. Accounting for this, nasogastric tube tip is within the a mid body of the stomach. Proximal side hole is likely within the proximal body of the stomach at the diaphragmatic hiatus peer visualized lung bases are clear. Cardiac size within normal limits. IMPRESSION: 1. Nasogastric tube tip within the mid body of the stomach. Electronically Signed   By: Helyn Numbers M.D.   On: 11/27/2022 04:03   ECHOCARDIOGRAM COMPLETE  Result Date: 11/26/2022    ECHOCARDIOGRAM REPORT   Patient Name:   WENZEL GARTRELL Date of Exam: 11/26/2022 Medical Rec #:  [id number]         Height:       72.0 in Accession #:    [id number]        Weight:       237.9 lb Date of Birth:  1961/04/07         BSA:          2.294 m Patient Age:    61 years          BP:           111/71 mmHg Patient Gender: M                 HR:           88 bpm. [facility] Procedure: 2D Echo, Cardiac Doppler and Color Doppler Indications:     Elevated Troponin  History:  Patient has no prior history of Echocardiogram examinations.                  Sepsis and Stroke, Signs/Symptoms:Altered Mental Status; Risk                  Factors:Current Smoker, Hypertension and Dyslipidemia.  Sonographer:     Aron Baba Referring Phys:  [phone] DAVID TAT Diagnosing Phys: Dina Rich MD  Sonographer Comments: Technically difficult study due to poor echo windows and no subcostal window. Image acquisition challenging due to uncooperative patient. IMPRESSIONS  1. Left ventricular ejection fraction, by estimation, is >75%. The left ventricle has hyperdynamic function. The left ventricle has no regional wall motion abnormalities. Left ventricular diastolic parameters are consistent with Grade I diastolic dysfunction (impaired relaxation).   2. RV not well visualized. Grossly appears normal in size and function. Indeterminate PASP, IVC poorly visualized. . Right ventricular systolic function was not well visualized. The right ventricular size is not well visualized.  3. The mitral valve was not well visualized. No evidence of mitral valve regurgitation. No evidence of mitral stenosis.  4. The tricuspid valve is abnormal.  5. The aortic valve was not well visualized. Aortic valve regurgitation is not visualized. No aortic stenosis is present.  6. Limited visualization of the ascending aortic aneurysm, consider additional imaging with CTA or MRA. Marland Kitchen Aortic dilatation noted. There is mild dilatation of the aortic root, measuring 42 mm. 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 FINDINGS  Left Ventricle: Left ventricular ejection fraction, by estimation, is >75%. The left ventricle has hyperdynamic function. The left ventricle has no regional wall motion abnormalities. The left ventricular internal cavity size was normal in size. There is no left ventricular hypertrophy. Left ventricular diastolic parameters are consistent with Grade I diastolic dysfunction (impaired relaxation). Normal left ventricular filling pressure. Right Ventricle: RV not well visualized. Grossly appears normal in size and function. Indeterminate PASP, IVC poorly visualized. The right ventricular size is not well visualized. Right vetricular wall thickness was not well visualized. Right ventricular  systolic function was not well visualized. Left Atrium: Left atrial size was normal in size. Right Atrium: Right atrial size was normal in size. Pericardium: There is no evidence of pericardial effusion. Mitral Valve: The mitral valve was not well visualized. No evidence of mitral valve regurgitation. No evidence of mitral valve stenosis. Tricuspid Valve: The tricuspid valve is abnormal. Tricuspid valve regurgitation is mild . No evidence of tricuspid  stenosis. Aortic Valve: The aortic valve was not well visualized. Aortic valve regurgitation is not visualized. No aortic stenosis is present. Aortic valve mean gradient measures 3.4 mmHg. Aortic valve peak gradient measures 7.0 mmHg. Aortic valve area, by VTI measures 1.97 cm. Pulmonic Valve: The pulmonic valve was not well visualized. Pulmonic valve regurgitation is not visualized. No evidence of pulmonic stenosis. Aorta: Limited visualization of the ascending aortic aneurysm, consider additional imaging with CTA or MRA. Aortic dilatation noted. There is mild dilatation of the aortic root, measuring 42 mm. 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 Venous: The inferior vena cava was not well visualized. IAS/Shunts: The interatrial septum was not well visualized.  LEFT VENTRICLE PLAX 2D LVIDd:         4.50 cm   Diastology LVIDs:         3.30 cm   LV e' medial:    6.96 cm/s LV PW:  1.00 cm   LV E/e' medial:  10.0 LV IVS:        1.00 cm   LV e' lateral:   9.03 cm/s LVOT diam:     1.90 cm   LV E/e' lateral: 7.7 LV SV:         54 LV SV Index:   23 LVOT Area:     2.84 cm  RIGHT VENTRICLE RV S prime:     20.80 cm/s LEFT ATRIUM           Index        RIGHT ATRIUM           Index LA diam:      4.60 cm 2.01 cm/m   RA Area:     27.40 cm LA Vol (A2C): 37.6 ml 16.39 ml/m  RA Volume:   95.60 ml  41.68 ml/m LA Vol (A4C): 60.8 ml 26.51 ml/m  AORTIC VALVE AV Area (Vmax):    2.19 cm AV Area (Vmean):   2.25 cm AV Area (VTI):     1.97 cm AV Vmax:           131.84 cm/s AV Vmean:          83.772 cm/s AV VTI:            0.272 m AV Peak Grad:      7.0 mmHg AV Mean Grad:      3.4 mmHg LVOT Vmax:         102.00 cm/s LVOT Vmean:        66.500 cm/s LVOT VTI:          0.189 m LVOT/AV VTI ratio: 0.69  AORTA Ao Root diam: 4.20 cm Ao Asc diam:  5.00 cm MITRAL VALVE                TRICUSPID VALVE MV Area (PHT): 3.39 cm     TR Peak grad:   19.2 mmHg MV Decel Time: 224 msec     TR Vmax:         219.00 cm/s MR Peak grad: 3.0 mmHg MR Vmax:      86.00 cm/s    SHUNTS MV E velocity: 69.80 cm/s   Systemic VTI:  0.19 m MV A velocity: 102.00 cm/s  Systemic Diam: 1.90 cm MV E/A ratio:  0.68 Dina Rich MD Electronically signed by Dina Rich MD Signature Date/Time: 11/26/2022/12:40:08 PM    Final (Updated)     Impression: 62 y.o. year old male with a history of COPD, CVA with left hemiparesis, dementia, hypertension, hyperlipidemia, BPH, GERD, ulcerative esophagitis, H. pylori gastritis treated with bismuth quadruple therapy followed by levofloxacin salvage therapy with eradication on EGD in July 2023, duodenitis, who presented from [facility] with hypoxia and possible altered mental status, admitted with bilateral pneumonia and started on IV antibiotics with improvement in respiratory status, but developed coffee-ground emesis overnight between 5/19 - 5/20 and ultimately oxygen requirements increased, currently on 2.5 L nasal cannula.  GI consulted for coffee-ground emesis.  Coffee-ground emesis/abnormal CT small bowel: History of the same last year in the setting of esophagitis, gastritis, duodenitis, but has been doing very well per patient's sister as well as nursing staff at [facility] until acute onset projectile coffee ground emesis 5/19 - 5/20.  Symptoms continued and NG tube was placed overnight, but patient pulled this out. Last episode of emesis was around 9 am this morning. Since that time, patient has received IV Protonix. Reglan has also been  ordered by hospitalist.  Hemoglobin stable/actually improved this morning to 12.9 from 11.6 yesterday.  CT abdomen pelvis without contrast today with mildly dilated loops of small bowel in the central abdomen favoring small bowel enteritis or partial small bowel obstruction.  He is not having diarrhea.  Reports a small BM yesterday, but there is no documentation of bowel movement in the chart. His bowel sounds are hypoactive on exam  today.  His clinical picture favors partial small bowel obstruction. Coffee ground emesis possibly secondary to mallory weiss tear vs gastritis, duodenitis, PUD though this is less likely as patient has been compliant with PPI twice daily outpatient.  At this point, recommend supportive measures. Will hold off on NG tube placement for now as patient pulled this out. Consider replacing if recurrent vomiting. Will make him NPO for now. Continue IV PPI BID, monitor H/H closely. May consider EGD inpatient if persistent significant decline hemoglobin/ongoing coffee ground emesis vs outpatient once clinically improved.   Plan: Hold off on replacing NG tube unless recurrent N/V.  NPO for now.  Repeat H/H now. Continue to monitor closely.  Transfuse for hemoglobin < 7. Continue IV PPI BID.  Continue IV Regln 5 mg QID Monitor for ongoing overt GI bleeding.  Holding off on inpatient EGD at this time. Can consider inpateint if persistent coffee ground emesis/declining hemoglobin   LOS: 2 days    11/27/2022, 12:12 PM   Ermalinda Memos, PA-C [facility]

## 2022-11-27 NOTE — Progress Notes (Signed)
Patient with intermittent projectile coffee-ground emesis.  NGT was placed

## 2022-11-27 NOTE — Progress Notes (Signed)
Pt had one episode of vomiting this shift, coffee ground emesis noted that was dark brown in color. MD notified. GI consult in place. Pt currently NPO, awaiting EGD.

## 2022-11-27 NOTE — Progress Notes (Signed)
Patient Blood pressure 118/98 pulse 95 Dr. Thomes Dinning notified, PRN Hydralazine given.   Patient had coffee-ground emesis three times this shift, Dr. Thomes Dinning notified. NG tube order in chart, NG tube placed by charge nurse and verified by chest X-ray. Gastric occult sent to lab. Patient began to finally rest this am. Zofran given twice this shift. Continued to monitor patient.

## 2022-11-27 NOTE — Progress Notes (Signed)
   11/27/22 1251  Assess: MEWS Score  Temp 98.9 F (37.2 C)  BP (!) 157/99  MAP (mmHg) 116  Pulse Rate (!) 103  Resp (!) 24  SpO2 92 %  O2 Device Nasal Cannula  O2 Flow Rate (L/min) 2.5 L/min  Assess: MEWS Score  MEWS Temp 0  MEWS Systolic 0  MEWS Pulse 1  MEWS RR 1  MEWS LOC 0  MEWS Score 2  MEWS Score Color Yellow  Assess: SIRS CRITERIA  SIRS Temperature  0  SIRS Pulse 1  SIRS Respirations  1  SIRS WBC 0  SIRS Score Sum  2   Pt is currently a yellow MEWS d/t respirations being inreased, elevated b/p and pulse. Pt has no complaints at this time, resting in bed at this time.

## 2022-11-27 NOTE — Progress Notes (Addendum)
Routine Afternoon rounds   Subjective: No further vomiting since this am. Pt denies BM, flatus.  Denies cp, sob, abd pain.  Vitals:   11/27/22 0807 11/27/22 0900 11/27/22 1251 11/27/22 1411  BP:  (!) 162/93 (!) 157/99   Pulse:  100 (!) 103   Resp:  20 (!) 24   Temp:  98.5 F (36.9 C) 98.9 F (37.2 C)   TempSrc:  Oral Oral   SpO2: 95% 92% 92% 94%  Weight:      Height:       CV--RRR Lung--bibasilar rales Abd--soft+BS/mildly distended   Assessment/Plan: Coffee Grounds Emesis, recurrent vomiting -CT abd/pelvis--Mildly prominent/dilated loops of small bowel in the anterior midabdomen with mild with mild jejunal mesentery stranding/trace interloop fluid . Proximally, the stomach and duodenum are not dilated. As such, this appearance favors small bowel enteritis over partial small bowel obstruction. No pneumatosis or free air. -continue zofran, reglan, PPI -give bisacodyl supp -if recurrent vomiting>>place NG and consult surgery -remain NPO with sips for meds -continue IVF -sister updated     Catarina Hartshorn, DO [facility]

## 2022-11-28 DIAGNOSIS — J181 Lobar pneumonia, unspecified organism: Secondary | ICD-10-CM | POA: Diagnosis not present

## 2022-11-28 DIAGNOSIS — F17200 Nicotine dependence, unspecified, uncomplicated: Secondary | ICD-10-CM

## 2022-11-28 DIAGNOSIS — I4892 Unspecified atrial flutter: Secondary | ICD-10-CM | POA: Diagnosis not present

## 2022-11-28 DIAGNOSIS — J9601 Acute respiratory failure with hypoxia: Secondary | ICD-10-CM

## 2022-11-28 DIAGNOSIS — A419 Sepsis, unspecified organism: Principal | ICD-10-CM

## 2022-11-28 DIAGNOSIS — I10 Essential (primary) hypertension: Secondary | ICD-10-CM

## 2022-11-28 DIAGNOSIS — J9602 Acute respiratory failure with hypercapnia: Secondary | ICD-10-CM

## 2022-11-28 DIAGNOSIS — F0390 Unspecified dementia without behavioral disturbance: Secondary | ICD-10-CM

## 2022-11-28 LAB — BASIC METABOLIC PANEL
Anion gap: 8 (ref 5–15)
BUN: 19 mg/dL (ref 8–23)
CO2: 28 mmol/L (ref 22–32)
Calcium: 8.2 mg/dL — ABNORMAL LOW (ref 8.9–10.3)
Chloride: 104 mmol/L (ref 98–111)
Creatinine, Ser: 0.87 mg/dL (ref 0.61–1.24)
GFR, Estimated: >60 mL/min (ref >60)
Glucose, Bld: 99 mg/dL (ref 70–99)
Potassium: 3.3 mmol/L — ABNORMAL LOW (ref 3.5–5.1)
Sodium: 140 mmol/L (ref 135–145)

## 2022-11-28 LAB — CBC
HCT: 37.3 % — ABNORMAL LOW (ref 39.0–52.0)
Hemoglobin: 12.5 g/dL — ABNORMAL LOW (ref 13.0–17.0)
MCH: 30 pg (ref 26.0–34.0)
MCHC: 33.5 g/dL (ref 30.0–36.0)
MCV: 89.7 fL (ref 80.0–100.0)
Platelets: 149 10*3/uL — ABNORMAL LOW (ref 150–400)
RBC: 4.16 MIL/uL — ABNORMAL LOW (ref 4.22–5.81)
RDW: 13.6 % (ref 11.5–15.5)
WBC: 8.6 10*3/uL (ref 4.0–10.5)
nRBC: 0 % (ref 0.0–0.2)

## 2022-11-28 LAB — MAGNESIUM: Magnesium: 1.7 mg/dL (ref 1.7–2.4)

## 2022-11-28 LAB — CULTURE, BLOOD (ROUTINE X 2): Special Requests: ADEQUATE

## 2022-11-28 LAB — TROPONIN I (HIGH SENSITIVITY): Troponin I (High Sensitivity): 186 ng/L (ref <18)

## 2022-11-28 NOTE — Progress Notes (Signed)
   11/28/22 0108  Provider Notification  Provider Name/Title Dr. Thomes Dinning  Date Provider Notified 11/28/22  Time Provider Notified 0109  Method of Notification  (Secure Chat)  Notification Reason Critical Result  Test performed and critical result Troponin 186  Date Critical Result Received 11/28/22  Time Critical Result Received 0109  Provider response No new orders  Date of Provider Response 11/28/22  Time of Provider Response 0133   Critical Lab: Troponin 186, Dr. Thomes Dinning notified, no new orders at this time continuing to monitor patient.

## 2022-11-28 NOTE — Progress Notes (Signed)
PROGRESS NOTE  Warren Sullivan VPX:[id number] DOB: 03/13/1961 DOA: 11/25/2022 PCP: Sherol Dade, DO (Inactive)  Brief History:  63 year old with a history of COPD, CVA with left hemiparesis, dementia, hypertension, hyperlipidemia, GERD, BPH presenting from [facility] with hypoxia and possible altered mental status.  Apparently, the patient was noted by staff at [facility] to have some altered mentation.  His vitals were checked, and he was noted to have hypoxia.  Upon EMS arrival, patient had oxygen saturation of 78% on room air.  He was placed on nonrebreather initially,, and he was transported to the emergency department for further evaluation and treatment.  The patient himself denies any fever, chills, chest pain, shortness of breath, nausea, vomiting, diarrhea, abdominal pain, dysuria, hematuria.  He does have a chronic cough.  Patient's sisters are at the bedside to assist with history.  They state that the patient has smoked since he was 62 years old.  They were not able to clarify, she smoked, but they stated that "he smoked like a chimney."  The patient continues to smoke about 4 cigarettes/day. At the bedside, the patient's sisters state that his mental status is at baseline.  At baseline, the patient is wheelchair-bound.  He does not walk.  He requires assistance with transfers. In the ED, the patient was febrile up to 102.4 F with tachycardia 110-120.  He has soft blood pressures with SBP in the 100s.  Oxygen saturation is 94-95% on 3 L.  WBC 14.2, hemoglobin 15.1, platelets 1-40,000.  LFTs were unremarkable.  Sodium 137, potassium 3.5, bicarbonate 30, serum creatinine 0.90.  Chest x-ray showed bilateral infiltrates VBG showed 7.3/69/<31/32.  EKG showed sinus tachycardia with nonspecific T wave changes.  BNP 36.  Lactic acid 1.6.  The patient was started on IV fluids and IV ceftriaxone and azithromycin. His respiratory status gradually improved.  Unfortunately, he  developed multiple episodes of emesis, some with coffee grounds in the evening 11/26/22.  NG was placed, but patient pulled it out.   Assessment/Plan:  Acute respiratory failure with hypoxia and hypercarbia -Secondary to pneumonia in the setting of COPD -Wean oxygen for saturation greater 92% -Currently demonstrating good saturation on 2 L nasal cannula supplementation. -Continue to follow clinical response. -Desaturation screening once more stable.   Severe sepsis -Present on admission -Presented with fever, tachycardia, leukocytosis and respiratory failure -Secondary to pneumonia -Lactic acid peaked 1.6 -Check procalcitonin 0.57 -Continue to maintain adequate hydration, continue current antibiotics and supportive care.  Intractable vomiting -Continue as needed antiemetics and the use of PPI -Appreciate GI service assistance and recommendations -Clear liquid diet will be provided and will assess response.   Lobar pneumonia -Continue ceftriaxone and azithromycin -Continue the use of flutter valve.   COPD -Continue as needed DuoNeb -No wheezing appreciated on exam. -Patient is speaking in full sentences.   Mixed hyperlipidemia -Continue statin -Heart healthy diet discussed with patient.   Dementia without behavioral disturbance -Continue Namenda -Oriented x 2; following commands appropriately.   Essential hypertension -Holding clonidine secondary to soft blood pressures initially -now cannot tolerate due to emesis   Depression/anxiety -Continue venlafaxine -Stable mood.   BPH -Continue tamsulosin -Complaints of urinary retention at the moment.   Hyperglycemia/Impaired glucose tolerance -Check hemoglobin A1c--5.8 -Follow CBG fluctuation. -Modified carbohydrate diet has been discussed with patient.      Family Communication:   sister updated 5/21   Consultants:  none   Code Status:  FULL   DVT  Prophylaxis:  SCDs     Procedures: As Listed in Progress  Note Above   Antibiotics: Ceftriaxone 5/19>> Azithro 5/19>>  Subjective: No nausea, no vomiting, no significant abdominal discomfort.  Reports being hungry and would like to eat.  He is passing gas but did not remember what was his last bowel movement.  No overt bleeding reported.  Objective: Vitals:   11/28/22 1618 11/28/22 1703 11/28/22 1820 11/28/22 1823  BP: (!) 161/87 (!) 168/107 (!) 175/100 (!) 175/100  Pulse: 90 97 98 96  Resp:  20  20  Temp:  98.6 F (37 C)  99.2 F (37.3 C)  TempSrc:  Oral  Oral  SpO2:  93%  95%  Weight:      Height:        Intake/Output Summary (Last 24 hours) at 11/28/2022 1843 Last data filed at 11/28/2022 1800 Gross per 24 hour  Intake 120 ml  Output --  Net 120 ml   Weight change:  Exam: General exam: Alert, awake, oriented x 2; reporting being hungry, no further nausea, vomiting or abdominal pain.  No overt bleeding reported. Respiratory system: No wheezing, no crackles, no use of accessory muscles. Cardiovascular system: Rate controlled, no rubs, no gallops, no JVD. Gastrointestinal system: Abdomen is obese, nondistended, no tenderness on palpation.  Decreased but present bowel sounds.  Slightly tympanic.  No guarding. Central nervous system: Alert and oriented. No focal neurological deficits. Extremities: No cyanosis or clubbing. Skin: No petechiae. Psychiatry: Judgement and insight appear normal. Mood & affect appropriate.    Data Reviewed: I have personally reviewed following labs and imaging studies  Basic Metabolic Panel: Recent Labs  Lab 11/25/22 0931 11/26/22 0537 11/27/22 0438 11/28/22 0446  NA 137 136 136 140  K 3.5 3.7 3.7 3.3*  CL 96* 103 101 104  CO2 30 26 26 28   GLUCOSE 202* 114* 158* 99  BUN 8 21 17 19   CREATININE 0.90 0.86 0.83 0.87  CALCIUM 8.5* 8.4* 8.5* 8.2*  MG  --   --  1.9 1.7   Liver Function Tests: Recent Labs  Lab 11/25/22 0931  AST 20  ALT 21  ALKPHOS 71  BILITOT 0.7  PROT 7.5  ALBUMIN 4.2    Coagulation Profile: Recent Labs  Lab 11/25/22 0931  INR 1.0   CBC: Recent Labs  Lab 11/25/22 0931 11/26/22 0537 11/27/22 0438 11/27/22 1220 11/28/22 0446  WBC 14.2* 18.5* 11.4*  --  8.6  NEUTROABS 12.8*  --   --   --   --   HGB 15.1 11.6* 12.9* 13.2 12.5*  HCT 45.7 35.2* 38.2* 39.3 37.3*  MCV 90.5 89.6 87.8  --  89.7  PLT 148* 135* 144*  --  149*   Cardiac Enzymes: No results for input(s): "CKTOTAL", "CKMB", "CKMBINDEX", "TROPONINI" in the last 168 hours.  Urine analysis:    Component Value Date/Time   COLORURINE YELLOW 06/08/2022 1742   APPEARANCEUR CLEAR 06/08/2022 1742   LABSPEC 1.026 06/08/2022 1742   PHURINE 6.0 06/08/2022 1742   GLUCOSEU NEGATIVE 06/08/2022 1742   HGBUR SMALL (A) 06/08/2022 1742   BILIRUBINUR NEGATIVE 06/08/2022 1742   KETONESUR 5 (A) 06/08/2022 1742   PROTEINUR 100 (A) 06/08/2022 1742   UROBILINOGEN 0.2 02/19/2013 1857   NITRITE NEGATIVE 06/08/2022 1742   LEUKOCYTESUR NEGATIVE 06/08/2022 1742   Sepsis Labs: @LABRCNTIP (procalcitonin:4,lacticidven:4) ) Recent Results (from the past 240 hour(s))  Blood Culture (routine x 2)     Status: None (Preliminary result)   Collection Time: 11/25/22  9:31 AM   Specimen: Left Antecubital; Blood  Result Value Ref Range Status   Specimen Description   Final    LEFT ANTECUBITAL BOTTLES DRAWN AEROBIC AND ANAEROBIC   Special Requests Blood Culture adequate volume  Final   Culture   Final    NO GROWTH 3 DAYS Performed at [facility], [address] [place], [place] [address]    Report Status PENDING  Incomplete  Blood Culture (routine x 2)     Status: None (Preliminary result)   Collection Time: 11/25/22  9:31 AM   Specimen: BLOOD  Result Value Ref Range Status   Specimen Description BLOOD  Final   Special Requests NONE  Final   Culture   Final    NO GROWTH 3 DAYS Performed at [facility], [address], [place], [place] [address]    Report Status PENDING  Incomplete  SARS Coronavirus 2 by  RT PCR (hospital order, performed in [facility] hospital lab) *cepheid single result test* Anterior Nasal Swab     Status: None   Collection Time: 11/25/22  9:31 AM   Specimen: Anterior Nasal Swab  Result Value Ref Range Status   SARS Coronavirus 2 by RT PCR NEGATIVE NEGATIVE Final    Comment: (NOTE) SARS-CoV-2 target nucleic acids are NOT DETECTED.  The SARS-CoV-2 RNA is generally detectable in upper and lower respiratory specimens during the acute phase of infection. The lowest concentration of SARS-CoV-2 viral copies this assay can detect is 250 copies / mL. A negative result does not preclude SARS-CoV-2 infection and should not be used as the sole basis for treatment or other patient management decisions.  A negative result may occur with improper specimen collection / handling, submission of specimen other than nasopharyngeal swab, presence of viral mutation(s) within the areas targeted by this assay, and inadequate number of viral copies (<250 copies / mL). A negative result must be combined with clinical observations, patient history, and epidemiological information.  Fact Sheet for Patients:   RoadLapTop.co.za  Fact Sheet for Healthcare Providers: http://kim-miller.com/  This test is not yet approved or  cleared by the [place] FDA and has been authorized for detection and/or diagnosis of SARS-CoV-2 by FDA under an Emergency Use Authorization (EUA).  This EUA will remain in effect (meaning this test can be used) for the duration of the COVID-19 declaration under Section 564(b)(1) of the Act, 21 U.S.C. section 360bbb-3(b)(1), unless the authorization is terminated or revoked sooner.  Performed at [facility], [address] [place], [place] [address]   MRSA Next Gen by PCR, Nasal     Status: None   Collection Time: 11/25/22  1:15 PM   Specimen: Nasal Mucosa; Nasal Swab  Result Value Ref Range Status   MRSA by PCR Next  Gen NOT DETECTED NOT DETECTED Final    Comment: (NOTE) The GeneXpert MRSA Assay (FDA approved for NASAL specimens only), is one component of a comprehensive MRSA colonization surveillance program. It is not intended to diagnose MRSA infection nor to guide or monitor treatment for MRSA infections. Test performance is not FDA approved in patients less than 54 years old. Performed at [facility], [address] [place], [place] [address]      Scheduled Meds:  arformoterol  15 mcg Nebulization BID   atorvastatin  20 mg Oral QHS   budesonide (PULMICORT) nebulizer solution  0.5 mg Nebulization BID   cyanocobalamin  500 mcg Oral Daily   enoxaparin (LOVENOX) injection  50 mg Subcutaneous Q24H  ipratropium-albuterol  3 mL Nebulization TID   memantine  10 mg Oral BID   metoCLOPramide (REGLAN) injection  5 mg Intravenous Q6H   ondansetron (ZOFRAN) IV  4 mg Intravenous Q6H   pantoprazole (PROTONIX) IV  40 mg Intravenous Q12H   tamsulosin  0.4 mg Oral Daily   venlafaxine XR  75 mg Oral Daily   Continuous Infusions:  azithromycin 500 mg (11/28/22 1001)   cefTRIAXone (ROCEPHIN)  IV 2 g (11/28/22 0913)    Procedures/Studies: CT ABDOMEN PELVIS WO CONTRAST  Result Date: 11/27/2022 CLINICAL DATA:  Altered mental status, vomiting EXAM: CT ABDOMEN AND PELVIS WITHOUT CONTRAST TECHNIQUE: Multidetector CT imaging of the abdomen and pelvis was performed following the standard protocol without IV contrast. RADIATION DOSE REDUCTION: This exam was performed according to the departmental dose-optimization program which includes automated exposure control, adjustment of the mA and/or kV according to patient size and/or use of iterative reconstruction technique. COMPARISON:  06/08/2022 FINDINGS: Lower chest: Scarring in the bilateral lung bases with chronic left pleural calcifications. Hepatobiliary: Unenhanced liver is unremarkable. Layering tiny gallstones (series 2/image 25), without associated inflammatory  changes. No intrahepatic or extrahepatic duct dilatation. Pancreas: Within normal limits. Spleen: Within normal limits. Adrenals/Urinary Tract: Adrenal glands are within normal limits. 2 mm nonobstructing left upper pole renal calculus (series 2/image 33). 15 mm simple left lower pole renal cyst (series 2/image 43), benign (Bosniak I). No follow-up is recommended. Right kidney is within normal limits. No hydronephrosis. Bladder is within normal limits. Stomach/Bowel: Stomach is notable for a moderate hiatal hernia. Mildly prominent/dilated loops of small bowel in the anterior mid abdomen with mild (series 2/image 46) with mild jejunal mesentery stranding/trace interloop fluid (series 2/image 46). Proximally, the stomach and duodenum are not dilated. As such, this appearance favors small bowel enteritis over partial small bowel obstruction. No pneumatosis or free air. Normal appendix (series 2/image 47). Scattered left colonic diverticulosis, without evidence of diverticulitis. Vascular/Lymphatic: No evidence of abdominal aortic aneurysm. Atherosclerotic calcifications of the abdominal aorta and branch vessels. No suspicious abdominopelvic lymphadenopathy. Reproductive: Prostate is unremarkable. Other: No abdominopelvic ascites. Musculoskeletal: Degenerative changes of the visualized thoracolumbar spine. Mild superior endplate changes with small node deformity at T12. IMPRESSION: Mildly dilated loops of small bowel in the central abdomen, favoring small bowel enteritis over partial small bowel obstruction. No pneumatosis or free air. Moderate hiatal hernia. 2 mm nonobstructing left upper pole renal calculus. No hydronephrosis. Cholelithiasis, without associated inflammatory changes. Electronically Signed   By: Charline Bills M.D.   On: 11/27/2022 11:01   DG Chest Port 1 View  Result Date: 11/27/2022 CLINICAL DATA:  Nasogastric tube placement EXAM: PORTABLE CHEST 1 VIEW COMPARISON:  11/25/2022, CT 06/08/2022  FINDINGS: Moderate hiatal hernia. Accounting for this, nasogastric tube tip is within the a mid body of the stomach. Proximal side hole is likely within the proximal body of the stomach at the diaphragmatic hiatus peer visualized lung bases are clear. Cardiac size within normal limits. IMPRESSION: 1. Nasogastric tube tip within the mid body of the stomach. Electronically Signed   By: Helyn Numbers M.D.   On: 11/27/2022 04:03   ECHOCARDIOGRAM COMPLETE  Result Date: 11/26/2022    ECHOCARDIOGRAM REPORT   Patient Name:   Warren Sullivan Date of Exam: 11/26/2022 Medical Rec #:  [id number]         Height:       72.0 in Accession #:    [id number]        Weight:  237.9 lb Date of Birth:  January 27, 1961         BSA:          2.294 m Patient Age:    61 years          BP:           111/71 mmHg Patient Gender: M                 HR:           88 bpm. [facility] Procedure: 2D Echo, Cardiac Doppler and Color Doppler Indications:     Elevated Troponin  History:         Patient has no prior history of Echocardiogram examinations.                  Sepsis and Stroke, Signs/Symptoms:Altered Mental Status; Risk                  Factors:Current Smoker, Hypertension and Dyslipidemia.  Sonographer:     Aron Baba Referring Phys:  [phone] DAVID TAT Diagnosing Phys: Dina Rich MD  Sonographer Comments: Technically difficult study due to poor echo windows and no subcostal window. Image acquisition challenging due to uncooperative patient. IMPRESSIONS  1. Left ventricular ejection fraction, by estimation, is >75%. The left ventricle has hyperdynamic function. The left ventricle has no regional wall motion abnormalities. Left ventricular diastolic parameters are consistent with Grade I diastolic dysfunction (impaired relaxation).  2. RV not well visualized. Grossly appears normal in size and function. Indeterminate PASP, IVC poorly visualized. . Right ventricular systolic function was not well visualized. The right  ventricular size is not well visualized.  3. The mitral valve was not well visualized. No evidence of mitral valve regurgitation. No evidence of mitral stenosis.  4. The tricuspid valve is abnormal.  5. The aortic valve was not well visualized. Aortic valve regurgitation is not visualized. No aortic stenosis is present.  6. Limited visualization of the ascending aortic aneurysm, consider additional imaging with CTA or MRA. Marland Kitchen Aortic dilatation noted. There is mild dilatation of the aortic root, measuring 42 mm. 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 FINDINGS  Left Ventricle: Left ventricular ejection fraction, by estimation, is >75%. The left ventricle has hyperdynamic function. The left ventricle has no regional wall motion abnormalities. The left ventricular internal cavity size was normal in size. There is no left ventricular hypertrophy. Left ventricular diastolic parameters are consistent with Grade I diastolic dysfunction (impaired relaxation). Normal left ventricular filling pressure. Right Ventricle: RV not well visualized. Grossly appears normal in size and function. Indeterminate PASP, IVC poorly visualized. The right ventricular size is not well visualized. Right vetricular wall thickness was not well visualized. Right ventricular  systolic function was not well visualized. Left Atrium: Left atrial size was normal in size. Right Atrium: Right atrial size was normal in size. Pericardium: There is no evidence of pericardial effusion. Mitral Valve: The mitral valve was not well visualized. No evidence of mitral valve regurgitation. No evidence of mitral valve stenosis. Tricuspid Valve: The tricuspid valve is abnormal. Tricuspid valve regurgitation is mild . No evidence of tricuspid stenosis. Aortic Valve: The aortic valve was not well visualized. Aortic valve regurgitation is not visualized. No aortic stenosis is present. Aortic valve mean gradient measures 3.4 mmHg.  Aortic valve peak gradient measures 7.0 mmHg. Aortic valve area, by VTI measures 1.97 cm. Pulmonic Valve: The pulmonic valve was not well visualized.  Pulmonic valve regurgitation is not visualized. No evidence of pulmonic stenosis. Aorta: Limited visualization of the ascending aortic aneurysm, consider additional imaging with CTA or MRA. Aortic dilatation noted. There is mild dilatation of the aortic root, measuring 42 mm. 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 Venous: The inferior vena cava was not well visualized. IAS/Shunts: The interatrial septum was not well visualized.  LEFT VENTRICLE PLAX 2D LVIDd:         4.50 cm   Diastology LVIDs:         3.30 cm   LV e' medial:    6.96 cm/s LV PW:         1.00 cm   LV E/e' medial:  10.0 LV IVS:        1.00 cm   LV e' lateral:   9.03 cm/s LVOT diam:     1.90 cm   LV E/e' lateral: 7.7 LV SV:         54 LV SV Index:   23 LVOT Area:     2.84 cm  RIGHT VENTRICLE RV S prime:     20.80 cm/s LEFT ATRIUM           Index        RIGHT ATRIUM           Index LA diam:      4.60 cm 2.01 cm/m   RA Area:     27.40 cm LA Vol (A2C): 37.6 ml 16.39 ml/m  RA Volume:   95.60 ml  41.68 ml/m LA Vol (A4C): 60.8 ml 26.51 ml/m  AORTIC VALVE AV Area (Vmax):    2.19 cm AV Area (Vmean):   2.25 cm AV Area (VTI):     1.97 cm AV Vmax:           131.84 cm/s AV Vmean:          83.772 cm/s AV VTI:            0.272 m AV Peak Grad:      7.0 mmHg AV Mean Grad:      3.4 mmHg LVOT Vmax:         102.00 cm/s LVOT Vmean:        66.500 cm/s LVOT VTI:          0.189 m LVOT/AV VTI ratio: 0.69  AORTA Ao Root diam: 4.20 cm Ao Asc diam:  5.00 cm MITRAL VALVE                TRICUSPID VALVE MV Area (PHT): 3.39 cm     TR Peak grad:   19.2 mmHg MV Decel Time: 224 msec     TR Vmax:        219.00 cm/s MR Peak grad: 3.0 mmHg MR Vmax:      86.00 cm/s    SHUNTS MV E velocity: 69.80 cm/s   Systemic VTI:  0.19 m MV A velocity: 102.00 cm/s  Systemic Diam: 1.90 cm MV E/A ratio:   0.68 Dina Rich MD Electronically signed by Dina Rich MD Signature Date/Time: 11/26/2022/12:40:08 PM    Final (Updated)    DG Chest Port 1 View  Result Date: 11/25/2022 CLINICAL DATA:  62 year old male with history of altered mental status. Possible sepsis. EXAM: PORTABLE CHEST 1 VIEW COMPARISON:  Chest x-ray 06/08/2022. FINDINGS: Opacity at the left base which may reflect areas of atelectasis and/or consolidation, with superimposed small left pleural effusion. Diffuse interstitial prominence with widespread peribronchial  cuffing. Ill-defined opacity in the right mid lung. No pneumothorax. No evidence of pulmonary edema. Heart size appears borderline enlarged. Moderate hiatal hernia. Mediastinal contours are distorted by patient positioning. Atherosclerotic calcifications are noted in the thoracic aorta. IMPRESSION: 1. Diffuse interstitial prominence and peribronchial cuffing, concerning for an acute bronchitis. Ill-defined opacity in the right mid lung opacity at the left lung base concerning for developing multilobar bronchopneumonia. 2. Small left pleural effusion. Electronically Signed   By: Trudie Reed M.D.   On: 11/25/2022 09:44    Vassie Loll, MD  [facility]  If 7PM-7AM, please contact night-coverage [email or web address] Password [email or web address] 11/28/2022, 6:43 PM   LOS: 3 days

## 2022-11-28 NOTE — Progress Notes (Signed)
   11/28/22 1703  Vitals  Temp 98.6 F (37 C)  Temp Source Oral  BP (!) 168/107  MAP (mmHg) 126  BP Location Right Arm  BP Method Manual  Patient Position (if appropriate) Lying  Pulse Rate 97  Pulse Rate Source Dinamap  Resp 20  MEWS COLOR  MEWS Score Color Green  Oxygen Therapy  SpO2 93 %  O2 Device Room Air  MEWS Score  MEWS Temp 0  MEWS Systolic 0  MEWS Pulse 0  MEWS RR 0  MEWS LOC 0  MEWS Score 0   MD Madera notified.

## 2022-11-28 NOTE — Progress Notes (Signed)
   11/28/22 1823  Vitals  Temp 99.2 F (37.3 C)  Temp Source Oral  BP (!) 175/100  MAP (mmHg) 121  BP Method Automatic  Patient Position (if appropriate) Lying  Pulse Rate 96  Pulse Rate Source Monitor  Resp 20  MEWS COLOR  MEWS Score Color Green  Oxygen Therapy  SpO2 95 %  O2 Device Nasal Cannula  O2 Flow Rate (L/min) 2 L/min  MEWS Score  MEWS Temp 0  MEWS Systolic 0  MEWS Pulse 0  MEWS RR 0  MEWS LOC 0  MEWS Score 0   MD Madera notified and PRN Labetalol given.

## 2022-11-28 NOTE — Progress Notes (Signed)
2219 checked on patient who was runing 180s-190 on telemetry monitor. EKG done and showed Stemi. Charge nurse Cicely RN notified, rapid response called, Dr. Thomes Dinning to bedside.

## 2022-11-28 NOTE — Progress Notes (Signed)
Subjective: Patient alert and oriented to person, disoriented to time, place, situation, reportedly feels fine and denies any recent nausea or vomiting, even yesterday. Denies abdominal pain. Reports he is ready to go back "to his room." He is unsure of last BM.   Confirmed with nursing staff, no further emesis today.   Objective: Vital signs in last 24 hours: Temp:  [98.8 F (37.1 C)-99.8 F (37.7 C)] 99 F (37.2 C) (05/22 0454) Pulse Rate:  [94-192] 99 (05/22 0454) Resp:  [20-24] 20 (05/22 0454) BP: (127-164)/(77-115) 127/77 (05/22 0454) SpO2:  [91 %-95 %] 93 % (05/22 0705) Last BM Date : 11/26/22 General:   Alert and pleasant, disoriented  Head:  Normocephalic and atraumatic. Eyes:  No icterus, sclera clear. Conjuctiva pink.  Mouth:  Without lesions, mucosa pink and moist.  Neck:  Supple, without thyromegaly or masses.  Heart:  S1, S2 present, no murmurs noted.  Lungs: Clear to auscultation bilaterally, without wheezing, rales, or rhonchi.  Abdomen:  Bowel sounds present, soft, non-tender, non-distended. No HSM or hernias noted. No rebound or guarding. No masses appreciated  Msk:  Symmetrical without gross deformities. Normal posture. Pulses:  Normal pulses noted. Extremities:  Without clubbing or edema. Neurologic:  Alert and  oriented x4;  grossly normal neurologically. Skin:  Warm and dry, intact without significant lesions.  Psych:  Alert and cooperative. Normal mood and affect.  Intake/Output from previous day: 05/21 0701 - 05/22 0700 In: 665 [P.O.:340; I.V.:325] Out: 400 [Urine:400] Intake/Output this shift: No intake/output data recorded.  Lab Results: Recent Labs    11/26/22 0537 11/27/22 0438 11/27/22 1220 11/28/22 0446  WBC 18.5* 11.4*  --  8.6  HGB 11.6* 12.9* 13.2 12.5*  HCT 35.2* 38.2* 39.3 37.3*  PLT 135* 144*  --  149*   BMET Recent Labs    11/26/22 0537 11/27/22 0438 11/28/22 0446  NA 136 136 140  K 3.7 3.7 3.3*  CL 103 101 104  CO2 26  26 28   GLUCOSE 114* 158* 99  BUN 21 17 19   CREATININE 0.86 0.83 0.87  CALCIUM 8.4* 8.5* 8.2*   LFT Recent Labs    11/25/22 0931  PROT 7.5  ALBUMIN 4.2  AST 20  ALT 21  ALKPHOS 71  BILITOT 0.7   PT/INR Recent Labs    11/25/22 0931  LABPROT 13.8  INR 1.0   Studies/Results: CT ABDOMEN PELVIS WO CONTRAST  Result Date: 11/27/2022 CLINICAL DATA:  Altered mental status, vomiting EXAM: CT ABDOMEN AND PELVIS WITHOUT CONTRAST TECHNIQUE: Multidetector CT imaging of the abdomen and pelvis was performed following the standard protocol without IV contrast. RADIATION DOSE REDUCTION: This exam was performed according to the departmental dose-optimization program which includes automated exposure control, adjustment of the mA and/or kV according to patient size and/or use of iterative reconstruction technique. COMPARISON:  06/08/2022 FINDINGS: Lower chest: Scarring in the bilateral lung bases with chronic left pleural calcifications. Hepatobiliary: Unenhanced liver is unremarkable. Layering tiny gallstones (series 2/image 25), without associated inflammatory changes. No intrahepatic or extrahepatic duct dilatation. Pancreas: Within normal limits. Spleen: Within normal limits. Adrenals/Urinary Tract: Adrenal glands are within normal limits. 2 mm nonobstructing left upper pole renal calculus (series 2/image 33). 15 mm simple left lower pole renal cyst (series 2/image 43), benign (Bosniak I). No follow-up is recommended. Right kidney is within normal limits. No hydronephrosis. Bladder is within normal limits. Stomach/Bowel: Stomach is notable for a moderate hiatal hernia. Mildly prominent/dilated loops of small bowel in the anterior mid abdomen with  mild (series 2/image 46) with mild jejunal mesentery stranding/trace interloop fluid (series 2/image 46). Proximally, the stomach and duodenum are not dilated. As such, this appearance favors small bowel enteritis over partial small bowel obstruction. No  pneumatosis or free air. Normal appendix (series 2/image 47). Scattered left colonic diverticulosis, without evidence of diverticulitis. Vascular/Lymphatic: No evidence of abdominal aortic aneurysm. Atherosclerotic calcifications of the abdominal aorta and branch vessels. No suspicious abdominopelvic lymphadenopathy. Reproductive: Prostate is unremarkable. Other: No abdominopelvic ascites. Musculoskeletal: Degenerative changes of the visualized thoracolumbar spine. Mild superior endplate changes with small node deformity at T12. IMPRESSION: Mildly dilated loops of small bowel in the central abdomen, favoring small bowel enteritis over partial small bowel obstruction. No pneumatosis or free air. Moderate hiatal hernia. 2 mm nonobstructing left upper pole renal calculus. No hydronephrosis. Cholelithiasis, without associated inflammatory changes. Electronically Signed   By: Charline Bills M.D.   On: 11/27/2022 11:01   DG Chest Port 1 View  Result Date: 11/27/2022 CLINICAL DATA:  Nasogastric tube placement EXAM: PORTABLE CHEST 1 VIEW COMPARISON:  11/25/2022, CT 06/08/2022 FINDINGS: Moderate hiatal hernia. Accounting for this, nasogastric tube tip is within the a mid body of the stomach. Proximal side hole is likely within the proximal body of the stomach at the diaphragmatic hiatus peer visualized lung bases are clear. Cardiac size within normal limits. IMPRESSION: 1. Nasogastric tube tip within the mid body of the stomach. Electronically Signed   By: Helyn Numbers M.D.   On: 11/27/2022 04:03   ECHOCARDIOGRAM COMPLETE  Result Date: 11/26/2022    ECHOCARDIOGRAM REPORT   Patient Name:   Warren Sullivan Date of Exam: 11/26/2022 Medical Rec #:  [id number]         Height:       72.0 in Accession #:    [id number]        Weight:       237.9 lb Date of Birth:  1960/10/24         BSA:          2.294 m Patient Age:    61 years          BP:           111/71 mmHg Patient Gender: M                 HR:           88 bpm.  [facility] Procedure: 2D Echo, Cardiac Doppler and Color Doppler Indications:     Elevated Troponin  History:         Patient has no prior history of Echocardiogram examinations.                  Sepsis and Stroke, Signs/Symptoms:Altered Mental Status; Risk                  Factors:Current Smoker, Hypertension and Dyslipidemia.  Sonographer:     Aron Baba Referring Phys:  [phone] DAVID TAT Diagnosing Phys: Dina Rich MD  Sonographer Comments: Technically difficult study due to poor echo windows and no subcostal window. Image acquisition challenging due to uncooperative patient. IMPRESSIONS  1. Left ventricular ejection fraction, by estimation, is >75%. The left ventricle has hyperdynamic function. The left ventricle has no regional wall motion abnormalities. Left ventricular diastolic parameters are consistent with Grade I diastolic dysfunction (impaired relaxation).  2. RV not well visualized. Grossly appears normal in size and function. Indeterminate PASP, IVC poorly visualized. . Right ventricular systolic function  was not well visualized. The right ventricular size is not well visualized.  3. The mitral valve was not well visualized. No evidence of mitral valve regurgitation. No evidence of mitral stenosis.  4. The tricuspid valve is abnormal.  5. The aortic valve was not well visualized. Aortic valve regurgitation is not visualized. No aortic stenosis is present.  6. Limited visualization of the ascending aortic aneurysm, consider additional imaging with CTA or MRA. Marland Kitchen Aortic dilatation noted. There is mild dilatation of the aortic root, measuring 42 mm. 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 FINDINGS  Left Ventricle: Left ventricular ejection fraction, by estimation, is >75%. The left ventricle has hyperdynamic function. The left ventricle has no regional wall motion abnormalities. The left ventricular internal cavity size was normal in size. There  is no left ventricular hypertrophy. Left ventricular diastolic parameters are consistent with Grade I diastolic dysfunction (impaired relaxation). Normal left ventricular filling pressure. Right Ventricle: RV not well visualized. Grossly appears normal in size and function. Indeterminate PASP, IVC poorly visualized. The right ventricular size is not well visualized. Right vetricular wall thickness was not well visualized. Right ventricular  systolic function was not well visualized. Left Atrium: Left atrial size was normal in size. Right Atrium: Right atrial size was normal in size. Pericardium: There is no evidence of pericardial effusion. Mitral Valve: The mitral valve was not well visualized. No evidence of mitral valve regurgitation. No evidence of mitral valve stenosis. Tricuspid Valve: The tricuspid valve is abnormal. Tricuspid valve regurgitation is mild . No evidence of tricuspid stenosis. Aortic Valve: The aortic valve was not well visualized. Aortic valve regurgitation is not visualized. No aortic stenosis is present. Aortic valve mean gradient measures 3.4 mmHg. Aortic valve peak gradient measures 7.0 mmHg. Aortic valve area, by VTI measures 1.97 cm. Pulmonic Valve: The pulmonic valve was not well visualized. Pulmonic valve regurgitation is not visualized. No evidence of pulmonic stenosis. Aorta: Limited visualization of the ascending aortic aneurysm, consider additional imaging with CTA or MRA. Aortic dilatation noted. There is mild dilatation of the aortic root, measuring 42 mm. 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 Venous: The inferior vena cava was not well visualized. IAS/Shunts: The interatrial septum was not well visualized.  LEFT VENTRICLE PLAX 2D LVIDd:         4.50 cm   Diastology LVIDs:         3.30 cm   LV e' medial:    6.96 cm/s LV PW:         1.00 cm   LV E/e' medial:  10.0 LV IVS:        1.00 cm   LV e' lateral:   9.03 cm/s LVOT diam:     1.90 cm    LV E/e' lateral: 7.7 LV SV:         54 LV SV Index:   23 LVOT Area:     2.84 cm  RIGHT VENTRICLE RV S prime:     20.80 cm/s LEFT ATRIUM           Index        RIGHT ATRIUM           Index LA diam:      4.60 cm 2.01 cm/m   RA Area:     27.40 cm LA Vol (A2C): 37.6 ml 16.39 ml/m  RA Volume:   95.60 ml  41.68 ml/m LA Vol (A4C): 60.8  ml 26.51 ml/m  AORTIC VALVE AV Area (Vmax):    2.19 cm AV Area (Vmean):   2.25 cm AV Area (VTI):     1.97 cm AV Vmax:           131.84 cm/s AV Vmean:          83.772 cm/s AV VTI:            0.272 m AV Peak Grad:      7.0 mmHg AV Mean Grad:      3.4 mmHg LVOT Vmax:         102.00 cm/s LVOT Vmean:        66.500 cm/s LVOT VTI:          0.189 m LVOT/AV VTI ratio: 0.69  AORTA Ao Root diam: 4.20 cm Ao Asc diam:  5.00 cm MITRAL VALVE                TRICUSPID VALVE MV Area (PHT): 3.39 cm     TR Peak grad:   19.2 mmHg MV Decel Time: 224 msec     TR Vmax:        219.00 cm/s MR Peak grad: 3.0 mmHg MR Vmax:      86.00 cm/s    SHUNTS MV E velocity: 69.80 cm/s   Systemic VTI:  0.19 m MV A velocity: 102.00 cm/s  Systemic Diam: 1.90 cm MV E/A ratio:  0.68 Dina Rich MD Electronically signed by Dina Rich MD Signature Date/Time: 11/26/2022/12:40:08 PM    Final (Updated)     Assessment: Yetta Numbers. Billy is a 62 year old male with a history of COPD, CVA with left hemiparesis, dementia, HTN, hyperlipidemia, BPH, GERD, ulcerative esophagitis, H. pylori gastritis treated with bismuth quadruple therapy followed by levofloxacin salvage therapy w/ eradication on EGD in July 2023, duodenitis, who presented from [facility] with hypoxia and possible AMS, admitted with bilateral pneumonia, who then developed coffee-ground emesis overnight between 5/19 - 5/20. GI consulted for coffee-ground emesis.   Coffee ground emesis/abnormal CT of small bowel: history of similar presentation last year in setting of esophagitis, gastritis and duodenitis.  Patient started with projectile coffee-ground  emesis on 519, symptoms continued and NG tube was placed overnight Monday which patient removed. started on IV Protonix, Reglan.  No further episodes of coffee-ground emesis.  Hemoglobin is stable, with mild decline to 12.5, 12.9 yesterday though majority of other cell lines also declined, likely in setting of IVFs.  CT abdomen pelvis with contrast yesterday with mildly dilated loops of small bowel in the central abdomen favoring small bowel enteritis or partial small bowel obstruction.  He is not having any diarrhea so enteritis less likely.  Coffee ground emesis possibly secondary to Mallory-Weiss tear, gastritis, duodenitis, PUD the patient does take PPI twice daily as an outpatient so PUD less likely.  As no further episodes of emesis GERD, hemoglobin stable continue with supportive measures.   Will consider EGD inpatient if recurrence of coffee-ground emesis or drop in hemoglobin occurs, otherwise will plan for EGD as outpatient.    Plan: Continue with supportive measures IV PPI BID Remain NPO Continue IV Reglan 5mg  QID EGD as outpatient unless recurrence of CGE or drop in hemoglobin. Trend h&h daily   LOS: 3 days    11/28/2022, 9:08 AM  Freya Zobrist L. Jeanmarie Hubert, MSN, APRN, AGNP-C Adult-Gerontology Nurse Practitioner [facility] Gastroenterology at [facility]

## 2022-11-28 NOTE — Progress Notes (Addendum)
Rapid response   Rapid response was called due to patient's EKG showing STEMI.  At bedside, patient was in no acute distress. EKG personally reviewed showed atrial flutter with no ST elevation.  Cardiologist on-call was consulted and after reviewing the EKG, he agreed that there the patient was not in STEMI. Troponin ordered was elevated at 198 > 186, this was possibly due to type II demand ischemia. Patient continues to be at rest without any distress  Total time:  27  minutes This includes time reviewing the chart including progress notes, labs, EKGs, taking medical decisions, ordering labs and documenting findings.

## 2022-11-28 NOTE — Progress Notes (Addendum)
   11/27/22 2225  Assess: MEWS Score  Temp 98.8 F (37.1 C)  BP (!) 134/115  MAP (mmHg) 123  Pulse Rate (!) 192  Resp 20  SpO2 93 %  O2 Device Room Air  O2 Flow Rate (L/min) 2.5 L/min  Assess: MEWS Score  MEWS Temp 0  MEWS Systolic 0  MEWS Pulse 3  MEWS RR 0  MEWS LOC 0  MEWS Score 3  MEWS Score Color Yellow  Assess: if the MEWS score is Yellow or Red  Were vital signs taken at a resting state? Yes  Focused Assessment Change from prior assessment (see assessment flowsheet)  Does the patient meet 2 or more of the SIRS criteria? No  Does the patient have a confirmed or suspected source of infection? No  MEWS guidelines implemented  Yes, yellow  Treat  MEWS Interventions Considered administering scheduled or prn medications/treatments as ordered  Take Vital Signs  Increase Vital Sign Frequency  Yellow: Q2hr x1, continue Q4hrs until patient remains green for 12hrs  Escalate  MEWS: Escalate Yellow: Discuss with charge nurse and consider notifying provider and/or RRT  Notify: Charge Nurse/RN  Name of Charge Nurse/RN Notified [place], RN  Provider Notification  Provider Name/Title Dr. Thomes Dinning  Date Provider Notified 11/27/22  Time Provider Notified 2230  Method of Notification  (Secure chat)  Notification Reason Other (Comment) ([id number] stemi)  Provider response At bedside  Date of Provider Response 11/27/22  Time of Provider Response 2240  Notify: Rapid Response  Name of Rapid Response RN Notified Jessica RN  Date Rapid Response Notified 11/27/22  Time Rapid Response Notified 2240  Assess: SIRS CRITERIA  SIRS Temperature  0  SIRS Pulse 1  SIRS Respirations  0  SIRS WBC 0  SIRS Score Sum  1

## 2022-11-28 NOTE — Progress Notes (Signed)
   11/27/22 2341  Provider Notification  Provider Name/Title Dr. Thomes Dinning  Date Provider Notified 11/27/22  Time Provider Notified 2342  Method of Notification  (Secure chat)  Notification Reason Critical Result  Test performed and critical result Troponin 198  Date Critical Result Received 11/27/22  Time Critical Result Received 2341  Provider response No new orders  Date of Provider Response 11/28/22  Time of Provider Response 0010   Critical lab: Troponin 198, Dr Thomes Dinning notified no new orders at this time. Continuing to monitor.

## 2022-11-28 NOTE — Progress Notes (Signed)
patient is getting irritable because he is hungry and wants food. Patient is NPO. Pt had oxygen off his face and almost got all the way out of the bed before someone got in the room. MD [facility] notified.

## 2022-11-29 DIAGNOSIS — J181 Lobar pneumonia, unspecified organism: Secondary | ICD-10-CM | POA: Diagnosis not present

## 2022-11-29 DIAGNOSIS — I10 Essential (primary) hypertension: Secondary | ICD-10-CM | POA: Diagnosis not present

## 2022-11-29 DIAGNOSIS — K920 Hematemesis: Secondary | ICD-10-CM

## 2022-11-29 DIAGNOSIS — J9601 Acute respiratory failure with hypoxia: Secondary | ICD-10-CM | POA: Diagnosis not present

## 2022-11-29 DIAGNOSIS — F17200 Nicotine dependence, unspecified, uncomplicated: Secondary | ICD-10-CM | POA: Diagnosis not present

## 2022-11-29 LAB — CBC
HCT: 37.2 % — ABNORMAL LOW (ref 39.0–52.0)
Hemoglobin: 12.1 g/dL — ABNORMAL LOW (ref 13.0–17.0)
MCH: 29.4 pg (ref 26.0–34.0)
MCHC: 32.5 g/dL (ref 30.0–36.0)
MCV: 90.3 fL (ref 80.0–100.0)
Platelets: 168 10*3/uL (ref 150–400)
RBC: 4.12 MIL/uL — ABNORMAL LOW (ref 4.22–5.81)
RDW: 13.2 % (ref 11.5–15.5)
WBC: 7.8 10*3/uL (ref 4.0–10.5)
nRBC: 0 % (ref 0.0–0.2)

## 2022-11-29 LAB — BASIC METABOLIC PANEL
Anion gap: 10 (ref 5–15)
BUN: 16 mg/dL (ref 8–23)
CO2: 26 mmol/L (ref 22–32)
Calcium: 8.4 mg/dL — ABNORMAL LOW (ref 8.9–10.3)
Chloride: 101 mmol/L (ref 98–111)
Creatinine, Ser: 0.81 mg/dL (ref 0.61–1.24)
GFR, Estimated: >60 mL/min (ref >60)
Glucose, Bld: 110 mg/dL — ABNORMAL HIGH (ref 70–99)
Potassium: 3.3 mmol/L — ABNORMAL LOW (ref 3.5–5.1)
Sodium: 137 mmol/L (ref 135–145)

## 2022-11-29 LAB — MAGNESIUM: Magnesium: 1.7 mg/dL (ref 1.7–2.4)

## 2022-11-29 MED ORDER — CLONIDINE HCL 0.1 MG PO TABS
0.2000 mg | ORAL_TABLET | Freq: Two times a day (BID) | ORAL | Status: DC
  Administered 2022-11-29 – 2022-12-01 (×5): 0.2 mg via ORAL
  Filled 2022-11-29 (×6): qty 2

## 2022-11-29 MED ORDER — POLYETHYLENE GLYCOL 3350 17 G PO PACK
17.0000 g | PACK | Freq: Every day | ORAL | Status: DC
  Administered 2022-11-29 – 2022-12-01 (×3): 17 g via ORAL
  Filled 2022-11-29 (×3): qty 1

## 2022-11-29 MED ORDER — POTASSIUM CHLORIDE CRYS ER 20 MEQ PO TBCR
40.0000 meq | EXTENDED_RELEASE_TABLET | Freq: Once | ORAL | Status: AC
  Administered 2022-11-29: 40 meq via ORAL
  Filled 2022-11-29: qty 2

## 2022-11-29 MED ORDER — ENOXAPARIN SODIUM 40 MG/0.4ML IJ SOSY
40.0000 mg | PREFILLED_SYRINGE | INTRAMUSCULAR | Status: DC
  Administered 2022-11-29 – 2022-11-30 (×2): 40 mg via SUBCUTANEOUS
  Filled 2022-11-29 (×2): qty 0.4

## 2022-11-29 MED ORDER — IPRATROPIUM-ALBUTEROL 0.5-2.5 (3) MG/3ML IN SOLN
3.0000 mL | Freq: Two times a day (BID) | RESPIRATORY_TRACT | Status: DC
  Administered 2022-11-29 – 2022-11-30 (×3): 3 mL via RESPIRATORY_TRACT
  Filled 2022-11-29 (×4): qty 3

## 2022-11-29 NOTE — Progress Notes (Signed)
Warren Sullivan, M.D. Gastroenterology & Hepatology   Interval History:  No acute events overnight.  Patient has not vomited for the last days.  Denies any nausea, vomiting, fever, chills.  Has not moved his bowels yet but is asking for food.  Inpatient Medications:  Current Facility-Administered Medications:    acetaminophen (TYLENOL) tablet 650 mg, 650 mg, Oral, Q6H PRN **OR** acetaminophen (TYLENOL) suppository 650 mg, 650 mg, Rectal, Q6H PRN, Tat, David, MD   arformoterol (BROVANA) nebulizer solution 15 mcg, 15 mcg, Nebulization, BID, Tat, David, MD, 15 mcg at 11/29/22 0700   atorvastatin (LIPITOR) tablet 20 mg, 20 mg, Oral, QHS, Tat, Onalee Hua, MD, 20 mg at 11/28/22 2014   budesonide (PULMICORT) nebulizer solution 0.5 mg, 0.5 mg, Nebulization, BID, Tat, David, MD, 0.5 mg at 11/29/22 0700   cloNIDine (CATAPRES) tablet 0.2 mg, 0.2 mg, Oral, BID, Vassie Loll, MD, 0.2 mg at 11/29/22 0981   cyanocobalamin (VITAMIN B12) tablet 500 mcg, 500 mcg, Oral, Daily, Tat, David, MD, 500 mcg at 11/29/22 0808   enoxaparin (LOVENOX) injection 40 mg, 40 mg, Subcutaneous, Q24H, Vassie Loll, MD   hydrALAZINE (APRESOLINE) injection 10 mg, 10 mg, Intravenous, Q6H PRN, Adefeso, Oladapo, DO, 10 mg at 11/28/22 1515   ipratropium-albuterol (DUONEB) 0.5-2.5 (3) MG/3ML nebulizer solution 3 mL, 3 mL, Nebulization, BID, Vassie Loll, MD   labetalol (NORMODYNE) injection 5 mg, 5 mg, Intravenous, Q6H PRN, Adefeso, Oladapo, DO, 5 mg at 11/28/22 1820   memantine (NAMENDA) tablet 10 mg, 10 mg, Oral, BID, Tat, David, MD, 10 mg at 11/29/22 0949   metoCLOPramide (REGLAN) injection 5 mg, 5 mg, Intravenous, Q6H, Tat, Onalee Hua, MD, 5 mg at 11/29/22 1139   ondansetron (ZOFRAN) tablet 4 mg, 4 mg, Oral, Q6H PRN **OR** ondansetron (ZOFRAN) injection 4 mg, 4 mg, Intravenous, Q6H PRN, Tat, David, MD, 4 mg at 11/27/22 0255   ondansetron (ZOFRAN) injection 4 mg, 4 mg, Intravenous, Q6H, Tat, Onalee Hua, MD, 4 mg at 11/29/22 1139    pantoprazole (PROTONIX) injection 40 mg, 40 mg, Intravenous, Q12H, Tat, Onalee Hua, MD, 40 mg at 11/29/22 0807   polyethylene glycol (MIRALAX / GLYCOLAX) packet 17 g, 17 g, Oral, Daily, Vassie Loll, MD   tamsulosin [facility]) capsule 0.4 mg, 0.4 mg, Oral, Daily, Tat, David, MD, 0.4 mg at 11/29/22 1914   venlafaxine XR (EFFEXOR-XR) 24 hr capsule 75 mg, 75 mg, Oral, Daily, Madueme, Elvira C, RPH, 75 mg at 11/29/22 7829   I/O    Intake/Output Summary (Last 24 hours) at 11/29/2022 1637 Last data filed at 11/29/2022 0900 Gross per 24 hour  Intake 480 ml  Output 1800 ml  Net -1320 ml     Physical Exam: Temp:  [98.6 F (37 C)-99.2 F (37.3 C)] 99 F (37.2 C) (05/23 1445) Pulse Rate:  [73-98] 73 (05/23 1445) Resp:  [20] 20 (05/23 0511) BP: (157-180)/(84-107) 165/88 (05/23 1445) SpO2:  [91 %-97 %] 94 % (05/23 1445)  Temp (24hrs), Avg:98.9 F (37.2 C), Min:98.6 F (37 C), Max:99.2 F (37.3 C)  GENERAL: The patient is AO x2, in no acute distress. On [place]. HEENT: Head is normocephalic and atraumatic. EOMI are intact. Mouth is well hydrated and without lesions. NECK: Supple. No masses LUNGS: Clear to auscultation. No presence of rhonchi/wheezing/rales. Adequate chest expansion HEART: RRR, normal s1 and s2. ABDOMEN: Soft, nontender, no guarding, no peritoneal signs, and nondistended. BS +. No masses. EXTREMITIES: Without any cyanosis, clubbing, rash, lesions or edema. NEUROLOGIC: AOx2, no focal motor deficit. SKIN: no jaundice, no rashes  Laboratory Data:  CBC:     Component Value Date/Time   WBC 7.8 11/29/2022 0436   RBC 4.12 (L) 11/29/2022 0436   HGB 12.1 (L) 11/29/2022 0436   HGB 13.8 10/19/2015 1649   HCT 37.2 (L) 11/29/2022 0436   HCT 41.0 10/19/2015 1649   PLT 168 11/29/2022 0436   PLT 125 (L) 10/19/2015 1649   MCV 90.3 11/29/2022 0436   MCV 92 10/19/2015 1649   MCH 29.4 11/29/2022 0436   MCHC 32.5 11/29/2022 0436   RDW 13.2 11/29/2022 0436   RDW 14.5 10/19/2015 1649    LYMPHSABS 0.7 11/25/2022 0931   LYMPHSABS 1.6 10/19/2015 1649   MONOABS 0.6 11/25/2022 0931   EOSABS 0.0 11/25/2022 0931   EOSABS 0.1 10/19/2015 1649   BASOSABS 0.0 11/25/2022 0931   BASOSABS 0.0 10/19/2015 1649   COAG:  Lab Results  Component Value Date   INR 1.0 11/25/2022   INR 1.1 01/30/2022   INR 1.0 08/17/2021    BMP:     Latest Ref Rng & Units 11/29/2022    4:36 AM 11/28/2022    4:46 AM 11/27/2022    4:38 AM  BMP  Glucose 70 - 99 mg/dL 161  99  096   BUN 8 - 23 mg/dL 16  19  17    Creatinine 0.61 - 1.24 mg/dL 0.45  4.09  8.11   Sodium 135 - 145 mmol/L 137  140  136   Potassium 3.5 - 5.1 mmol/L 3.3  3.3  3.7   Chloride 98 - 111 mmol/L 101  104  101   CO2 22 - 32 mmol/L 26  28  26    Calcium 8.9 - 10.3 mg/dL 8.4  8.2  8.5     HEPATIC:     Latest Ref Rng & Units 11/25/2022    9:31 AM 06/10/2022    5:01 AM 06/08/2022    9:10 AM  Hepatic Function  Total Protein 6.5 - 8.1 g/dL 7.5  6.3  9.0   Albumin 3.5 - 5.0 g/dL 4.2  3.2  4.4   AST 15 - 41 U/L 20  10  17    ALT 0 - 44 U/L 21  10  15    Alk Phosphatase 38 - 126 U/L 71  50  76   Total Bilirubin 0.3 - 1.2 mg/dL 0.7  0.8  0.6     CARDIAC: No results found for: "CKTOTAL", "CKMB", "CKMBINDEX", "TROPONINI"    Imaging: I personally reviewed and interpreted the available labs, imaging and endoscopic files.   Assessment/Plan: 62 y.o. year old male with a history of COPD, CVA with left hemiparesis, dementia, hypertension, hyperlipidemia, BPH, GERD, ulcerative esophagitis, H. pylori gastritis s/p  eradication, hiatal hernia, duodenitis, who was admitted to the hospital after presenting pneumonia.  Gastroenterology was consulted as the patient presented 1 episode of coffee-ground emesis.  The patient had a CT of the abdomen pelvis without contrast that showed mildly dilated small bowel loops concerning for possible enteritis versus partial small bowel obstruction.  Due to this, an attempt to place NG tube was performed but he pulled  out the NG tube and has not allowed placement of this again.  Fortunately, he has not vomited anymore while on Reglan 5 mg 3 times daily.  Hemoglobin has remained stable throughout his hospitalization, with most recent value of 12.1.  BUN has been low at 16.   The patient has been improving with the use of Reglan 3 times daily, which will be continued for now.  Will  also start him on MiraLAX and advance his diet to GI soft as tolerated.  Notably, the patient has had extensive investigations for abdominal CT scan of the esophagus but no malignancy has been found, findings have been consistent with esophagitis.  Unless he presents any recurrent episodes of vomiting or overt gastrointestinal bleeding with drop in his hemoglobin, we we will proceed with outpatient EGD for further evaluation of his recurrent coffee-ground emesis.  -Advance diet to GI soft diet -Continue with Reglan 5 mg IV every 8 hours -Continue with pantoprazole 40 mg twice daily IV -Consider outpatient EGD unless the patient presents recurrent episodes of vomiting or overt gastrointestinal bleeding with drop in his hemoglobin  Warren Blazing, MD Gastroenterology and Hepatology [facility] Gastroenterology

## 2022-11-29 NOTE — Progress Notes (Signed)
PROGRESS NOTE  Warren Sullivan ZOX:[id number] DOB: December 13, 1960 DOA: 11/25/2022 PCP: Sherol Dade, DO (Inactive)  Brief History:  62 year old with a history of COPD, CVA with left hemiparesis, dementia, hypertension, hyperlipidemia, GERD, BPH presenting from [facility] with hypoxia and possible altered mental status.  Apparently, the patient was noted by staff at [facility] to have some altered mentation.  His vitals were checked, and he was noted to have hypoxia.  Upon EMS arrival, patient had oxygen saturation of 78% on room air.  He was placed on nonrebreather initially,, and he was transported to the emergency department for further evaluation and treatment.  The patient himself denies any fever, chills, chest pain, shortness of breath, nausea, vomiting, diarrhea, abdominal pain, dysuria, hematuria.  He does have a chronic cough.  Patient's sisters are at the bedside to assist with history.  They state that the patient has smoked since he was 62 years old.  They were not able to clarify, she smoked, but they stated that "he smoked like a chimney."  The patient continues to smoke about 4 cigarettes/day. At the bedside, the patient's sisters state that his mental status is at baseline.  At baseline, the patient is wheelchair-bound.  He does not walk.  He requires assistance with transfers. In the ED, the patient was febrile up to 102.4 F with tachycardia 110-120.  He has soft blood pressures with SBP in the 100s.  Oxygen saturation is 94-95% on 3 L.  WBC 14.2, hemoglobin 15.1, platelets 1-40,000.  LFTs were unremarkable.  Sodium 137, potassium 3.5, bicarbonate 30, serum creatinine 0.90.  Chest x-ray showed bilateral infiltrates VBG showed 7.3/69/<31/32.  EKG showed sinus tachycardia with nonspecific T wave changes.  BNP 36.  Lactic acid 1.6.  The patient was started on IV fluids and IV ceftriaxone and azithromycin. His respiratory status gradually improved.  Unfortunately, he  developed multiple episodes of emesis, some with coffee grounds in the evening 11/26/22.  NG was placed, but patient pulled it out.   Assessment/Plan:  Acute respiratory failure with hypoxia and hypercarbia -Secondary to pneumonia in the setting of COPD -Wean oxygen for saturation greater 92% -Currently demonstrating good saturation on on room air and is speaking in full sentences. -Continue to follow clinical response. -Desaturation screening once more stable.   Severe sepsis -Present on admission -Presented with fever, tachycardia, leukocytosis and respiratory failure -Secondary to pneumonia -Lactic acid peaked 1.6 -Check procalcitonin 0.57 -Continue to maintain adequate hydration, continue current antibiotics and supportive care. -Planning for oral antibiotics in a.m. if patient capable of tolerating diet.  Intractable vomiting -Continue as needed antiemetics and the use of PPI -Appreciate GI service assistance and recommendations -Patient has tolerated clear liquid diet; after discussing with GI we will start daily MiraLAX, continue PPI at the use of Reglan while advancing diet. -Outpatient endoscopic evaluation anticipated.   Lobar pneumonia -Continue ceftriaxone and azithromycin -Continue the use of flutter valve. -Diet will be advanced and if well-tolerated planning to transition antibiotics to oral route for discharge purposes.   COPD -Continue as needed DuoNeb -No wheezing appreciated on exam. -Patient is speaking in full sentences. -Good saturation on room air appreciated.   Mixed hyperlipidemia -Continue statin -Heart healthy diet discussed with patient.   Dementia without behavioral disturbance -Continue Namenda -Continue constant reorientation; patient following commands appropriately.   Essential hypertension -No further nausea, vomiting or difficulty tolerating p.o.'s -Blood pressure has been elevated; will resume the use of clonidine  and follow vital  signs.   Depression/anxiety -Continue venlafaxine -Stable mood. -No hallucinations.   BPH -Continue tamsulosin -Complaints of urinary retention at the moment.   Hyperglycemia/Impaired glucose tolerance -Check hemoglobin A1c--5.8 -Follow CBG fluctuation. -Modified carbohydrate diet has been discussed with patient.      Family Communication:   sister updated 5/21   Consultants:  none   Code Status:  FULL   DVT Prophylaxis:  SCDs     Procedures: As Listed in Progress Note Above   Antibiotics: Ceftriaxone 5/19>> Azithro 5/19>>  Subjective: No nausea, no vomiting, no abdominal discomfort for or overt bleeding reported.  Patient is afebrile.  Objective: Vitals:   11/28/22 2051 11/28/22 2053 11/29/22 0511 11/29/22 0700  BP: (!) 180/87 (!) 168/84 (!) 157/91   Pulse: 92  91   Resp: 20  20   Temp: 98.6 F (37 C)     TempSrc: Oral     SpO2: 97%   97%  Weight:      Height:        Intake/Output Summary (Last 24 hours) at 11/29/2022 1425 Last data filed at 11/29/2022 0500 Gross per 24 hour  Intake 240 ml  Output 1800 ml  Net -1560 ml   Weight change:  Exam: General exam: Alert, awake, and following commands appropriately; currently off oxygen with good saturation appreciated. Respiratory system: Positive rhonchi; no wheezing, no crackles.  No using accessory muscles. Cardiovascular system: Rate controlled, no rubs, no gallops, no JVD. Gastrointestinal system: Abdomen is raised, nondistended, soft and nontender. No organomegaly or masses felt. Normal bowel sounds heard. Central nervous system: No new focal neurological deficits. Extremities: No cyanosis, no clubbing, no edema. Skin: No petechiae. Psychiatry: Judgement and insight appear impaired secondary to underlying dementia but is stable and at baseline for him.  Data Reviewed: I have personally reviewed following labs and imaging studies  Basic Metabolic Panel: Recent Labs  Lab 11/25/22 0931  11/26/22 0537 11/27/22 0438 11/28/22 0446 11/29/22 0436  NA 137 136 136 140 137  K 3.5 3.7 3.7 3.3* 3.3*  CL 96* 103 101 104 101  CO2 30 26 26 28 26   GLUCOSE 202* 114* 158* 99 110*  BUN 8 21 17 19 16   CREATININE 0.90 0.86 0.83 0.87 0.81  CALCIUM 8.5* 8.4* 8.5* 8.2* 8.4*  MG  --   --  1.9 1.7 1.7   Liver Function Tests: Recent Labs  Lab 11/25/22 0931  AST 20  ALT 21  ALKPHOS 71  BILITOT 0.7  PROT 7.5  ALBUMIN 4.2   Coagulation Profile: Recent Labs  Lab 11/25/22 0931  INR 1.0   CBC: Recent Labs  Lab 11/25/22 0931 11/26/22 0537 11/27/22 0438 11/27/22 1220 11/28/22 0446 11/29/22 0436  WBC 14.2* 18.5* 11.4*  --  8.6 7.8  NEUTROABS 12.8*  --   --   --   --   --   HGB 15.1 11.6* 12.9* 13.2 12.5* 12.1*  HCT 45.7 35.2* 38.2* 39.3 37.3* 37.2*  MCV 90.5 89.6 87.8  --  89.7 90.3  PLT 148* 135* 144*  --  149* 168   Cardiac Enzymes: No results for input(s): "CKTOTAL", "CKMB", "CKMBINDEX", "TROPONINI" in the last 168 hours.  Urine analysis:    Component Value Date/Time   COLORURINE YELLOW 06/08/2022 1742   APPEARANCEUR CLEAR 06/08/2022 1742   LABSPEC 1.026 06/08/2022 1742   PHURINE 6.0 06/08/2022 1742   GLUCOSEU NEGATIVE 06/08/2022 1742   HGBUR SMALL (A) 06/08/2022 1742   BILIRUBINUR NEGATIVE 06/08/2022 1742  KETONESUR 5 (A) 06/08/2022 1742   PROTEINUR 100 (A) 06/08/2022 1742   UROBILINOGEN 0.2 02/19/2013 1857   NITRITE NEGATIVE 06/08/2022 1742   LEUKOCYTESUR NEGATIVE 06/08/2022 1742   Sepsis Labs: @LABRCNTIP (procalcitonin:4,lacticidven:4) ) Recent Results (from the past 240 hour(s))  Blood Culture (routine x 2)     Status: None (Preliminary result)   Collection Time: 11/25/22  9:31 AM   Specimen: Left Antecubital; Blood  Result Value Ref Range Status   Specimen Description   Final    LEFT ANTECUBITAL BOTTLES DRAWN AEROBIC AND ANAEROBIC   Special Requests Blood Culture adequate volume  Final   Culture   Final    NO GROWTH 4 DAYS Performed at [facility], [address], [place], [place] [address]    Report Status PENDING  Incomplete  Blood Culture (routine x 2)     Status: None (Preliminary result)   Collection Time: 11/25/22  9:31 AM   Specimen: BLOOD  Result Value Ref Range Status   Specimen Description BLOOD  Final   Special Requests NONE  Final   Culture   Final    NO GROWTH 4 DAYS Performed at [facility], [address], [place], [place] [address]    Report Status PENDING  Incomplete  SARS Coronavirus 2 by RT PCR (hospital order, performed in [facility] hospital lab) *cepheid single result test* Anterior Nasal Swab     Status: None   Collection Time: 11/25/22  9:31 AM   Specimen: Anterior Nasal Swab  Result Value Ref Range Status   SARS Coronavirus 2 by RT PCR NEGATIVE NEGATIVE Final    Comment: (NOTE) SARS-CoV-2 target nucleic acids are NOT DETECTED.  The SARS-CoV-2 RNA is generally detectable in upper and lower respiratory specimens during the acute phase of infection. The lowest concentration of SARS-CoV-2 viral copies this assay can detect is 250 copies / mL. A negative result does not preclude SARS-CoV-2 infection and should not be used as the sole basis for treatment or other patient management decisions.  A negative result may occur with improper specimen collection / handling, submission of specimen other than nasopharyngeal swab, presence of viral mutation(s) within the areas targeted by this assay, and inadequate number of viral copies (<250 copies / mL). A negative result must be combined with clinical observations, patient history, and epidemiological information.  Fact Sheet for Patients:   RoadLapTop.co.za  Fact Sheet for Healthcare Providers: http://kim-miller.com/  This test is not yet approved or  cleared by the [place] FDA and has been authorized for detection and/or diagnosis of SARS-CoV-2 by FDA under an Emergency Use Authorization (EUA).   This EUA will remain in effect (meaning this test can be used) for the duration of the COVID-19 declaration under Section 564(b)(1) of the Act, 21 U.S.C. section 360bbb-3(b)(1), unless the authorization is terminated or revoked sooner.  Performed at [facility], [address], [place], [place] [address]   MRSA Next Gen by PCR, Nasal     Status: None   Collection Time: 11/25/22  1:15 PM   Specimen: Nasal Mucosa; Nasal Swab  Result Value Ref Range Status   MRSA by PCR Next Gen NOT DETECTED NOT DETECTED Final    Comment: (NOTE) The GeneXpert MRSA Assay (FDA approved for NASAL specimens only), is one component of a comprehensive MRSA colonization surveillance program. It is not intended to diagnose MRSA infection nor to guide or monitor treatment for MRSA infections. Test performance is not FDA approved in patients less than 23 years old.  Performed at [facility], [address] [place], [place] [address]      Scheduled Meds:  arformoterol  15 mcg Nebulization BID   atorvastatin  20 mg Oral QHS   budesonide (PULMICORT) nebulizer solution  0.5 mg Nebulization BID   cloNIDine  0.2 mg Oral BID   cyanocobalamin  500 mcg Oral Daily   enoxaparin (LOVENOX) injection  50 mg Subcutaneous Q24H   ipratropium-albuterol  3 mL Nebulization BID   memantine  10 mg Oral BID   metoCLOPramide (REGLAN) injection  5 mg Intravenous Q6H   ondansetron (ZOFRAN) IV  4 mg Intravenous Q6H   pantoprazole (PROTONIX) IV  40 mg Intravenous Q12H   polyethylene glycol  17 g Oral Daily   tamsulosin  0.4 mg Oral Daily   venlafaxine XR  75 mg Oral Daily   Procedures/Studies: CT ABDOMEN PELVIS WO CONTRAST  Result Date: 11/27/2022 CLINICAL DATA:  Altered mental status, vomiting EXAM: CT ABDOMEN AND PELVIS WITHOUT CONTRAST TECHNIQUE: Multidetector CT imaging of the abdomen and pelvis was performed following the standard protocol without IV contrast. RADIATION DOSE REDUCTION: This exam was performed according to the  departmental dose-optimization program which includes automated exposure control, adjustment of the mA and/or kV according to patient size and/or use of iterative reconstruction technique. COMPARISON:  06/08/2022 FINDINGS: Lower chest: Scarring in the bilateral lung bases with chronic left pleural calcifications. Hepatobiliary: Unenhanced liver is unremarkable. Layering tiny gallstones (series 2/image 25), without associated inflammatory changes. No intrahepatic or extrahepatic duct dilatation. Pancreas: Within normal limits. Spleen: Within normal limits. Adrenals/Urinary Tract: Adrenal glands are within normal limits. 2 mm nonobstructing left upper pole renal calculus (series 2/image 33). 15 mm simple left lower pole renal cyst (series 2/image 43), benign (Bosniak I). No follow-up is recommended. Right kidney is within normal limits. No hydronephrosis. Bladder is within normal limits. Stomach/Bowel: Stomach is notable for a moderate hiatal hernia. Mildly prominent/dilated loops of small bowel in the anterior mid abdomen with mild (series 2/image 46) with mild jejunal mesentery stranding/trace interloop fluid (series 2/image 46). Proximally, the stomach and duodenum are not dilated. As such, this appearance favors small bowel enteritis over partial small bowel obstruction. No pneumatosis or free air. Normal appendix (series 2/image 47). Scattered left colonic diverticulosis, without evidence of diverticulitis. Vascular/Lymphatic: No evidence of abdominal aortic aneurysm. Atherosclerotic calcifications of the abdominal aorta and branch vessels. No suspicious abdominopelvic lymphadenopathy. Reproductive: Prostate is unremarkable. Other: No abdominopelvic ascites. Musculoskeletal: Degenerative changes of the visualized thoracolumbar spine. Mild superior endplate changes with small node deformity at T12. IMPRESSION: Mildly dilated loops of small bowel in the central abdomen, favoring small bowel enteritis over partial  small bowel obstruction. No pneumatosis or free air. Moderate hiatal hernia. 2 mm nonobstructing left upper pole renal calculus. No hydronephrosis. Cholelithiasis, without associated inflammatory changes. Electronically Signed   By: Charline Bills M.D.   On: 11/27/2022 11:01   DG Chest Port 1 View  Result Date: 11/27/2022 CLINICAL DATA:  Nasogastric tube placement EXAM: PORTABLE CHEST 1 VIEW COMPARISON:  11/25/2022, CT 06/08/2022 FINDINGS: Moderate hiatal hernia. Accounting for this, nasogastric tube tip is within the a mid body of the stomach. Proximal side hole is likely within the proximal body of the stomach at the diaphragmatic hiatus peer visualized lung bases are clear. Cardiac size within normal limits. IMPRESSION: 1. Nasogastric tube tip within the mid body of the stomach. Electronically Signed   By: Helyn Numbers M.D.   On: 11/27/2022 04:03   ECHOCARDIOGRAM COMPLETE  Result  Date: 11/26/2022    ECHOCARDIOGRAM REPORT   Patient Name:   HILLIARD BALOUGH Date of Exam: 11/26/2022 Medical Rec #:  [id number]         Height:       72.0 in Accession #:    [id number]        Weight:       237.9 lb Date of Birth:  Oct 20, 1960         BSA:          2.294 m Patient Age:    61 years          BP:           111/71 mmHg Patient Gender: M                 HR:           88 bpm. [facility] Procedure: 2D Echo, Cardiac Doppler and Color Doppler Indications:     Elevated Troponin  History:         Patient has no prior history of Echocardiogram examinations.                  Sepsis and Stroke, Signs/Symptoms:Altered Mental Status; Risk                  Factors:Current Smoker, Hypertension and Dyslipidemia.  Sonographer:     Aron Baba Referring Phys:  [phone] DAVID TAT Diagnosing Phys: Dina Rich MD  Sonographer Comments: Technically difficult study due to poor echo windows and no subcostal window. Image acquisition challenging due to uncooperative patient. IMPRESSIONS  1. Left ventricular ejection  fraction, by estimation, is >75%. The left ventricle has hyperdynamic function. The left ventricle has no regional wall motion abnormalities. Left ventricular diastolic parameters are consistent with Grade I diastolic dysfunction (impaired relaxation).  2. RV not well visualized. Grossly appears normal in size and function. Indeterminate PASP, IVC poorly visualized. . Right ventricular systolic function was not well visualized. The right ventricular size is not well visualized.  3. The mitral valve was not well visualized. No evidence of mitral valve regurgitation. No evidence of mitral stenosis.  4. The tricuspid valve is abnormal.  5. The aortic valve was not well visualized. Aortic valve regurgitation is not visualized. No aortic stenosis is present.  6. Limited visualization of the ascending aortic aneurysm, consider additional imaging with CTA or MRA. Marland Kitchen Aortic dilatation noted. There is mild dilatation of the aortic root, measuring 42 mm. 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 FINDINGS  Left Ventricle: Left ventricular ejection fraction, by estimation, is >75%. The left ventricle has hyperdynamic function. The left ventricle has no regional wall motion abnormalities. The left ventricular internal cavity size was normal in size. There is no left ventricular hypertrophy. Left ventricular diastolic parameters are consistent with Grade I diastolic dysfunction (impaired relaxation). Normal left ventricular filling pressure. Right Ventricle: RV not well visualized. Grossly appears normal in size and function. Indeterminate PASP, IVC poorly visualized. The right ventricular size is not well visualized. Right vetricular wall thickness was not well visualized. Right ventricular  systolic function was not well visualized. Left Atrium: Left atrial size was normal in size. Right Atrium: Right atrial size was normal in size. Pericardium: There is no evidence of pericardial effusion.  Mitral Valve: The mitral valve was not well visualized. No evidence of mitral valve regurgitation. No evidence of mitral valve stenosis. Tricuspid Valve: The tricuspid valve is abnormal.  Tricuspid valve regurgitation is mild . No evidence of tricuspid stenosis. Aortic Valve: The aortic valve was not well visualized. Aortic valve regurgitation is not visualized. No aortic stenosis is present. Aortic valve mean gradient measures 3.4 mmHg. Aortic valve peak gradient measures 7.0 mmHg. Aortic valve area, by VTI measures 1.97 cm. Pulmonic Valve: The pulmonic valve was not well visualized. Pulmonic valve regurgitation is not visualized. No evidence of pulmonic stenosis. Aorta: Limited visualization of the ascending aortic aneurysm, consider additional imaging with CTA or MRA. Aortic dilatation noted. There is mild dilatation of the aortic root, measuring 42 mm. 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 Venous: The inferior vena cava was not well visualized. IAS/Shunts: The interatrial septum was not well visualized.  LEFT VENTRICLE PLAX 2D LVIDd:         4.50 cm   Diastology LVIDs:         3.30 cm   LV e' medial:    6.96 cm/s LV PW:         1.00 cm   LV E/e' medial:  10.0 LV IVS:        1.00 cm   LV e' lateral:   9.03 cm/s LVOT diam:     1.90 cm   LV E/e' lateral: 7.7 LV SV:         54 LV SV Index:   23 LVOT Area:     2.84 cm  RIGHT VENTRICLE RV S prime:     20.80 cm/s LEFT ATRIUM           Index        RIGHT ATRIUM           Index LA diam:      4.60 cm 2.01 cm/m   RA Area:     27.40 cm LA Vol (A2C): 37.6 ml 16.39 ml/m  RA Volume:   95.60 ml  41.68 ml/m LA Vol (A4C): 60.8 ml 26.51 ml/m  AORTIC VALVE AV Area (Vmax):    2.19 cm AV Area (Vmean):   2.25 cm AV Area (VTI):     1.97 cm AV Vmax:           131.84 cm/s AV Vmean:          83.772 cm/s AV VTI:            0.272 m AV Peak Grad:      7.0 mmHg AV Mean Grad:      3.4 mmHg LVOT Vmax:         102.00 cm/s LVOT Vmean:         66.500 cm/s LVOT VTI:          0.189 m LVOT/AV VTI ratio: 0.69  AORTA Ao Root diam: 4.20 cm Ao Asc diam:  5.00 cm MITRAL VALVE                TRICUSPID VALVE MV Area (PHT): 3.39 cm     TR Peak grad:   19.2 mmHg MV Decel Time: 224 msec     TR Vmax:        219.00 cm/s MR Peak grad: 3.0 mmHg MR Vmax:      86.00 cm/s    SHUNTS MV E velocity: 69.80 cm/s   Systemic VTI:  0.19 m MV A velocity: 102.00 cm/s  Systemic Diam: 1.90 cm MV E/A ratio:  0.68 Dina Rich MD Electronically signed by Dina Rich MD Signature Date/Time: 11/26/2022/12:40:08 PM    Final (  Updated)    DG Chest Port 1 View  Result Date: 11/25/2022 CLINICAL DATA:  62 year old male with history of altered mental status. Possible sepsis. EXAM: PORTABLE CHEST 1 VIEW COMPARISON:  Chest x-ray 06/08/2022. FINDINGS: Opacity at the left base which may reflect areas of atelectasis and/or consolidation, with superimposed small left pleural effusion. Diffuse interstitial prominence with widespread peribronchial cuffing. Ill-defined opacity in the right mid lung. No pneumothorax. No evidence of pulmonary edema. Heart size appears borderline enlarged. Moderate hiatal hernia. Mediastinal contours are distorted by patient positioning. Atherosclerotic calcifications are noted in the thoracic aorta. IMPRESSION: 1. Diffuse interstitial prominence and peribronchial cuffing, concerning for an acute bronchitis. Ill-defined opacity in the right mid lung opacity at the left lung base concerning for developing multilobar bronchopneumonia. 2. Small left pleural effusion. Electronically Signed   By: Trudie Reed M.D.   On: 11/25/2022 09:44    Vassie Loll, MD  [facility]  If 7PM-7AM, please contact night-coverage [email or web address] Password [email or web address] 11/29/2022, 2:25 PM   LOS: 4 days

## 2022-11-30 ENCOUNTER — Telehealth: Payer: Self-pay | Admitting: Gastroenterology

## 2022-11-30 DIAGNOSIS — R933 Abnormal findings on diagnostic imaging of other parts of digestive tract: Secondary | ICD-10-CM | POA: Diagnosis not present

## 2022-11-30 DIAGNOSIS — J9601 Acute respiratory failure with hypoxia: Secondary | ICD-10-CM | POA: Diagnosis not present

## 2022-11-30 DIAGNOSIS — F17200 Nicotine dependence, unspecified, uncomplicated: Secondary | ICD-10-CM | POA: Diagnosis not present

## 2022-11-30 DIAGNOSIS — K921 Melena: Secondary | ICD-10-CM | POA: Diagnosis not present

## 2022-11-30 DIAGNOSIS — J181 Lobar pneumonia, unspecified organism: Secondary | ICD-10-CM | POA: Diagnosis not present

## 2022-11-30 DIAGNOSIS — I10 Essential (primary) hypertension: Secondary | ICD-10-CM | POA: Diagnosis not present

## 2022-11-30 LAB — HEMOGLOBIN AND HEMATOCRIT, BLOOD
HCT: 37.1 % — ABNORMAL LOW (ref 39.0–52.0)
Hemoglobin: 12.3 g/dL — ABNORMAL LOW (ref 13.0–17.0)

## 2022-11-30 LAB — CULTURE, BLOOD (ROUTINE X 2)

## 2022-11-30 NOTE — Progress Notes (Signed)
Gastroenterology Progress Note   Referring Provider: No ref. provider found Primary Care Physician:  Warren Dade, DO (Inactive) Primary Gastroenterologist:  Dr. Dolores Frame, MD  Patient ID: Warren Sullivan; [id number]; Apr 16, 1961    Subjective   Doing well. Good appetite. No further nausea or vomiting. Wants to eat more solid food. No shortness of breath or chest pain. + flatus but no bowel movement yet today but was given miralax.   Objective   Vital signs in last 24 hours Temp:  [98.4 F (36.9 C)-99 F (37.2 C)] 98.6 F (37 C) (05/24 0423) Pulse Rate:  [73-92] 82 (05/24 0423) Resp:  [20] 20 (05/23 2144) BP: (106-165)/(76-89) 128/85 (05/24 0423) SpO2:  [91 %-94 %] 92 % (05/24 0758) Last BM Date : 11/26/22  Physical Exam General:   Alert and oriented, pleasant Head:  Normocephalic and atraumatic. Eyes:  No icterus, sclera clear. Conjuctiva pink.  Abdomen:  Bowel sounds present, soft, non-tender, non-distended. No HSM or hernias noted. No rebound or guarding. No masses appreciated  Msk:  Symmetrical without gross deformities. Normal posture. Neurologic:  Alert and  oriented x4;  grossly normal neurologically. Skin:  Warm and dry, intact without significant lesions.  Psych:  Alert and cooperative. Normal mood and affect.  Intake/Output from previous day: 05/23 0701 - 05/24 0700 In: 440 [P.O.:440] Out: 400 [Urine:400] Intake/Output this shift: Total I/O In: 240 [P.O.:240] Out: -   Lab Results  Recent Labs    11/27/22 1220 11/28/22 0446 11/29/22 0436  WBC  --  8.6 7.8  HGB 13.2 12.5* 12.1*  HCT 39.3 37.3* 37.2*  PLT  --  149* 168   BMET Recent Labs    11/28/22 0446 11/29/22 0436  NA 140 137  K 3.3* 3.3*  CL 104 101  CO2 28 26  GLUCOSE 99 110*  BUN 19 16  CREATININE 0.87 0.81  CALCIUM 8.2* 8.4*   LFT No results for input(s): "PROT", "ALBUMIN", "AST", "ALT", "ALKPHOS", "BILITOT", "BILIDIR", "IBILI" in the last 72  hours. PT/INR No results for input(s): "LABPROT", "INR" in the last 72 hours. Hepatitis Panel No results for input(s): "HEPBSAG", "HCVAB", "HEPAIGM", "HEPBIGM" in the last 72 hours.   Studies/Results CT ABDOMEN PELVIS WO CONTRAST  Result Date: 11/27/2022 CLINICAL DATA:  Altered mental status, vomiting EXAM: CT ABDOMEN AND PELVIS WITHOUT CONTRAST TECHNIQUE: Multidetector CT imaging of the abdomen and pelvis was performed following the standard protocol without IV contrast. RADIATION DOSE REDUCTION: This exam was performed according to the departmental dose-optimization program which includes automated exposure control, adjustment of the mA and/or kV according to patient size and/or use of iterative reconstruction technique. COMPARISON:  06/08/2022 FINDINGS: Lower chest: Scarring in the bilateral lung bases with chronic left pleural calcifications. Hepatobiliary: Unenhanced liver is unremarkable. Layering tiny gallstones (series 2/image 25), without associated inflammatory changes. No intrahepatic or extrahepatic duct dilatation. Pancreas: Within normal limits. Spleen: Within normal limits. Adrenals/Urinary Tract: Adrenal glands are within normal limits. 2 mm nonobstructing left upper pole renal calculus (series 2/image 33). 15 mm simple left lower pole renal cyst (series 2/image 43), benign (Bosniak I). No follow-up is recommended. Right kidney is within normal limits. No hydronephrosis. Bladder is within normal limits. Stomach/Bowel: Stomach is notable for a moderate hiatal hernia. Mildly prominent/dilated loops of small bowel in the anterior mid abdomen with mild (series 2/image 46) with mild jejunal mesentery stranding/trace interloop fluid (series 2/image 46). Proximally, the stomach and duodenum are not dilated. As such, this appearance favors small bowel enteritis over  partial small bowel obstruction. No pneumatosis or free air. Normal appendix (series 2/image 47). Scattered left colonic  diverticulosis, without evidence of diverticulitis. Vascular/Lymphatic: No evidence of abdominal aortic aneurysm. Atherosclerotic calcifications of the abdominal aorta and branch vessels. No suspicious abdominopelvic lymphadenopathy. Reproductive: Prostate is unremarkable. Other: No abdominopelvic ascites. Musculoskeletal: Degenerative changes of the visualized thoracolumbar spine. Mild superior endplate changes with small node deformity at T12. IMPRESSION: Mildly dilated loops of small bowel in the central abdomen, favoring small bowel enteritis over partial small bowel obstruction. No pneumatosis or free air. Moderate hiatal hernia. 2 mm nonobstructing left upper pole renal calculus. No hydronephrosis. Cholelithiasis, without associated inflammatory changes. Electronically Signed   By: Charline Bills M.D.   On: 11/27/2022 11:01   DG Chest Port 1 View  Result Date: 11/27/2022 CLINICAL DATA:  Nasogastric tube placement EXAM: PORTABLE CHEST 1 VIEW COMPARISON:  11/25/2022, CT 06/08/2022 FINDINGS: Moderate hiatal hernia. Accounting for this, nasogastric tube tip is within the a mid body of the stomach. Proximal side hole is likely within the proximal body of the stomach at the diaphragmatic hiatus peer visualized lung bases are clear. Cardiac size within normal limits. IMPRESSION: 1. Nasogastric tube tip within the mid body of the stomach. Electronically Signed   By: Helyn Numbers M.D.   On: 11/27/2022 04:03   ECHOCARDIOGRAM COMPLETE  Result Date: 11/26/2022    ECHOCARDIOGRAM REPORT   Patient Name:   Warren Sullivan Date of Exam: 11/26/2022 Medical Rec #:  [id number]         Height:       72.0 in Accession #:    [id number]        Weight:       237.9 lb Date of Birth:  [birth year]         BSA:          2.294 m Patient Age:    61 years          BP:           111/71 mmHg Patient Gender: M                 HR:           88 bpm. [facility] Procedure: 2D Echo, Cardiac Doppler and Color Doppler  Indications:     Elevated Troponin  History:         Patient has no prior history of Echocardiogram examinations.                  Sepsis and Stroke, Signs/Symptoms:Altered Mental Status; Risk                  Factors:Current Smoker, Hypertension and Dyslipidemia.  Sonographer:     Aron Baba Referring Phys:  [phone] DAVID TAT Diagnosing Phys: Dina Rich MD  Sonographer Comments: Technically difficult study due to poor echo windows and no subcostal window. Image acquisition challenging due to uncooperative patient. IMPRESSIONS  1. Left ventricular ejection fraction, by estimation, is >75%. The left ventricle has hyperdynamic function. The left ventricle has no regional wall motion abnormalities. Left ventricular diastolic parameters are consistent with Grade I diastolic dysfunction (impaired relaxation).  2. RV not well visualized. Grossly appears normal in size and function. Indeterminate PASP, IVC poorly visualized. . Right ventricular systolic function was not well visualized. The right ventricular size is not well visualized.  3. The mitral valve was not well visualized. No evidence of mitral valve regurgitation. No evidence of mitral  stenosis.  4. The tricuspid valve is abnormal.  5. The aortic valve was not well visualized. Aortic valve regurgitation is not visualized. No aortic stenosis is present.  6. Limited visualization of the ascending aortic aneurysm, consider additional imaging with CTA or MRA. Marland Kitchen Aortic dilatation noted. There is mild dilatation of the aortic root, measuring 42 mm. 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 FINDINGS  Left Ventricle: Left ventricular ejection fraction, by estimation, is >75%. The left ventricle has hyperdynamic function. The left ventricle has no regional wall motion abnormalities. The left ventricular internal cavity size was normal in size. There is no left ventricular hypertrophy. Left ventricular diastolic parameters are  consistent with Grade I diastolic dysfunction (impaired relaxation). Normal left ventricular filling pressure. Right Ventricle: RV not well visualized. Grossly appears normal in size and function. Indeterminate PASP, IVC poorly visualized. The right ventricular size is not well visualized. Right vetricular wall thickness was not well visualized. Right ventricular  systolic function was not well visualized. Left Atrium: Left atrial size was normal in size. Right Atrium: Right atrial size was normal in size. Pericardium: There is no evidence of pericardial effusion. Mitral Valve: The mitral valve was not well visualized. No evidence of mitral valve regurgitation. No evidence of mitral valve stenosis. Tricuspid Valve: The tricuspid valve is abnormal. Tricuspid valve regurgitation is mild . No evidence of tricuspid stenosis. Aortic Valve: The aortic valve was not well visualized. Aortic valve regurgitation is not visualized. No aortic stenosis is present. Aortic valve mean gradient measures 3.4 mmHg. Aortic valve peak gradient measures 7.0 mmHg. Aortic valve area, by VTI measures 1.97 cm. Pulmonic Valve: The pulmonic valve was not well visualized. Pulmonic valve regurgitation is not visualized. No evidence of pulmonic stenosis. Aorta: Limited visualization of the ascending aortic aneurysm, consider additional imaging with CTA or MRA. Aortic dilatation noted. There is mild dilatation of the aortic root, measuring 42 mm. 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 Venous: The inferior vena cava was not well visualized. IAS/Shunts: The interatrial septum was not well visualized.  LEFT VENTRICLE PLAX 2D LVIDd:         4.50 cm   Diastology LVIDs:         3.30 cm   LV e' medial:    6.96 cm/s LV PW:         1.00 cm   LV E/e' medial:  10.0 LV IVS:        1.00 cm   LV e' lateral:   9.03 cm/s LVOT diam:     1.90 cm   LV E/e' lateral: 7.7 LV SV:         54 LV SV Index:   23 LVOT Area:     2.84  cm  RIGHT VENTRICLE RV S prime:     20.80 cm/s LEFT ATRIUM           Index        RIGHT ATRIUM           Index LA diam:      4.60 cm 2.01 cm/m   RA Area:     27.40 cm LA Vol (A2C): 37.6 ml 16.39 ml/m  RA Volume:   95.60 ml  41.68 ml/m LA Vol (A4C): 60.8 ml 26.51 ml/m  AORTIC VALVE AV Area (Vmax):    2.19 cm AV Area (Vmean):   2.25 cm AV Area (VTI):     1.97 cm AV  Vmax:           131.84 cm/s AV Vmean:          83.772 cm/s AV VTI:            0.272 m AV Peak Grad:      7.0 mmHg AV Mean Grad:      3.4 mmHg LVOT Vmax:         102.00 cm/s LVOT Vmean:        66.500 cm/s LVOT VTI:          0.189 m LVOT/AV VTI ratio: 0.69  AORTA Ao Root diam: 4.20 cm Ao Asc diam:  5.00 cm MITRAL VALVE                TRICUSPID VALVE MV Area (PHT): 3.39 cm     TR Peak grad:   19.2 mmHg MV Decel Time: 224 msec     TR Vmax:        219.00 cm/s MR Peak grad: 3.0 mmHg MR Vmax:      86.00 cm/s    SHUNTS MV E velocity: 69.80 cm/s   Systemic VTI:  0.19 m MV A velocity: 102.00 cm/s  Systemic Diam: 1.90 cm MV E/A ratio:  0.68 Dina Rich MD Electronically signed by Dina Rich MD Signature Date/Time: 11/26/2022/12:40:08 PM    Final (Updated)    DG Chest Port 1 View  Result Date: 11/25/2022 CLINICAL DATA:  62 year old male with history of altered mental status. Possible sepsis. EXAM: PORTABLE CHEST 1 VIEW COMPARISON:  Chest x-ray 06/08/2022. FINDINGS: Opacity at the left base which may reflect areas of atelectasis and/or consolidation, with superimposed small left pleural effusion. Diffuse interstitial prominence with widespread peribronchial cuffing. Ill-defined opacity in the right mid lung. No pneumothorax. No evidence of pulmonary edema. Heart size appears borderline enlarged. Moderate hiatal hernia. Mediastinal contours are distorted by patient positioning. Atherosclerotic calcifications are noted in the thoracic aorta. IMPRESSION: 1. Diffuse interstitial prominence and peribronchial cuffing, concerning for an acute bronchitis.  Ill-defined opacity in the right mid lung opacity at the left lung base concerning for developing multilobar bronchopneumonia. 2. Small left pleural effusion. Electronically Signed   By: Trudie Reed M.D.   On: 11/25/2022 09:44    Assessment  62 y.o. male with a history of COPD, CVA with left hemiparesis, dementia, HTN, HLD, BPH, GERD, ulcerative esophagitis, H. pylori gastritis s/p eradication (physical quadruple therapy followed by levofloxacin salvage therapy), hiatal hernia, and duodenitis who was admitted to the hospital from [facility] after presenting with AMS and bilateral pneumonia.  GI consulted for reports of coffee-ground emesis.  Coffee-ground emesis/abnormal CT of small bowel: CT A/P with contrast 11/27/2022 with mildly dilated loops of small bowel with central abdomen patent small bowel enteritis with partial small bowel obstruction.  Enteritis less likely given no diarrhea however suspect bowel obstruction could be secondary to dehydration caused by recent infection.  He reportedly developed coffee-ground emesis 5/19 - 5/20 which is presumed to be in the setting of possible Mallory-Weiss tear, gastritis, duodenitis or known previous esophagitis.  He continues to be maintained on PPI twice daily.  He is on multiple prior hospitalizations in the past for this.  NG tube placed earlier this admission which was removed by the patient.  Plan to continue Reglan 5 mg 3 times daily and PPI twice daily.  Tolerating soft diet very well today.  Has not had any further nausea, vomiting, or coffee-ground emesis.  Hemoglobin has remained stable.  Continue with  plan continue perform outpatient EGD once he develops overt GI bleeding, significant drop in hemoglobin, or recurrent episodes of vomiting.  Plan / Recommendations  PPI BID Continue Reglan 5mg  TID Soft diet Trend H/H.  Monitor for overt GI bleeding Plan for outpatient EGD unless he presents with overt GI bleeding, or significant  anemia Continue MiraLAX for constipation Follow up outpatient in 2-3 weeks. Will arrange.   GI will sign off at this time.  Please reach out if you have any new concerns or change in patient's status.    LOS: 5 days   11/30/2022, 10:49 AM   Brooke Bonito, MSN, FNP-BC, AGACNP-BC [facility]

## 2022-11-30 NOTE — Progress Notes (Signed)
Patient has tolerated diet well. Still has not had a BM, but has passed a significant amount of gas throughout shift.

## 2022-11-30 NOTE — Progress Notes (Signed)
Patient passed gas but did not have a bm.

## 2022-11-30 NOTE — Progress Notes (Signed)
PROGRESS NOTE  Warren Sullivan ZOX:[id number] DOB: 1960-10-25 DOA: 11/25/2022 PCP: Sherol Dade, DO (Inactive)  Brief History:  62 year old with a history of COPD, CVA with left hemiparesis, dementia, hypertension, hyperlipidemia, GERD, BPH presenting from [facility] with hypoxia and possible altered mental status.  Apparently, the patient was noted by staff at [facility] to have some altered mentation.  His vitals were checked, and he was noted to have hypoxia.  Upon EMS arrival, patient had oxygen saturation of 78% on room air.  He was placed on nonrebreather initially,, and he was transported to the emergency department for further evaluation and treatment.  The patient himself denies any fever, chills, chest pain, shortness of breath, nausea, vomiting, diarrhea, abdominal pain, dysuria, hematuria.  He does have a chronic cough.  Patient's sisters are at the bedside to assist with history.  They state that the patient has smoked since he was 62 years old.  They were not able to clarify, she smoked, but they stated that "he smoked like a chimney."  The patient continues to smoke about 4 cigarettes/day. At the bedside, the patient's sisters state that his mental status is at baseline.  At baseline, the patient is wheelchair-bound.  He does not walk.  He requires assistance with transfers. In the ED, the patient was febrile up to 102.4 F with tachycardia 110-120.  He has soft blood pressures with SBP in the 100s.  Oxygen saturation is 94-95% on 3 L.  WBC 14.2, hemoglobin 15.1, platelets 1-40,000.  LFTs were unremarkable.  Sodium 137, potassium 3.5, bicarbonate 30, serum creatinine 0.90.  Chest x-ray showed bilateral infiltrates VBG showed 7.3/69/<31/32.  EKG showed sinus tachycardia with nonspecific T wave changes.  BNP 36.  Lactic acid 1.6.  The patient was started on IV fluids and IV ceftriaxone and azithromycin. His respiratory status gradually improved.  Unfortunately, he  developed multiple episodes of emesis, some with coffee grounds in the evening 11/26/22.  NG was placed, but patient pulled it out.   Assessment/Plan:  Acute respiratory failure with hypoxia and hypercarbia -Secondary to pneumonia in the setting of COPD -Wean oxygen for saturation greater 92% -Currently demonstrating good saturation on on room air and is speaking in full sentences. -Continue to follow clinical response. -Desaturation screening once more stable if oxygen needs appreciated. -Currently good saturation on room air.   Severe sepsis -Present on admission -Presented with fever, tachycardia, leukocytosis and respiratory failure -Secondary to pneumonia -Lactic acid peaked 1.6 -Check procalcitonin 0.57 -Continue to maintain adequate hydration, continue current antibiotics and supportive care. -Planning for oral antibiotics in a.m. if patient capable of tolerating diet.  Intractable vomiting -Continue as needed antiemetics and the use of PPI -Appreciate GI service assistance and recommendations -Patient has tolerated clear liquid diet; after discussing with GI started on daily MiraLAX, continue PPI and continue the use of Reglan while advancing diet. -Outpatient endoscopic evaluation anticipated. -Hopefully discharge back to skilled nursing facility on 12/01/2022.   Lobar pneumonia -Continue ceftriaxone and azithromycin -Continue the use of flutter valve. -Diet will be advanced and if well-tolerated planning to transition antibiotics to oral route for discharge purposes.   COPD -Continue as needed DuoNeb -No wheezing appreciated on exam. -Patient is speaking in full sentences. -Good saturation on room air appreciated.   Mixed hyperlipidemia -Continue statin -Heart healthy diet discussed with patient.   Dementia without behavioral disturbance -Continue Namenda -Continue constant reorientation; patient following commands appropriately.   Essential hypertension -  No  further nausea, vomiting or difficulty tolerating p.o.'s -Blood pressure has been elevated; will resume the use of clonidine and follow vital signs.   Depression/anxiety -Continue venlafaxine -Stable mood. -No hallucinations.   BPH -Continue tamsulosin -Complaints of urinary retention at the moment.   Hyperglycemia/Impaired glucose tolerance -Check hemoglobin A1c--5.8 -Follow CBG fluctuation. -Modified carbohydrate diet has been discussed with patient.      Family Communication:   sister updated 5/21   Consultants:  none   Code Status:  FULL   DVT Prophylaxis:  SCDs     Procedures: As Listed in Progress Note Above   Antibiotics: Ceftriaxone 5/19>> Azithro 5/19>>  Subjective: No fever, no chest pain, no requiring oxygen supplementation; no nausea or vomiting and so far tolerating soft diet.  Patient is passing gas but no bowel movements yet.  Objective: Vitals:   11/29/22 2144 11/30/22 0423 11/30/22 0758 11/30/22 1252  BP: 106/76 128/85  (!) 141/99  Pulse: 76 82  73  Resp: 20   18  Temp:  98.6 F (37 C)  98.1 F (36.7 C)  TempSrc:  Oral  Axillary  SpO2:  91% 92% 92%  Weight:      Height:        Intake/Output Summary (Last 24 hours) at 11/30/2022 1614 Last data filed at 11/30/2022 1300 Gross per 24 hour  Intake 680 ml  Output 400 ml  Net 280 ml   Weight change:   Exam: General exam: Oriented x 1-2 intermittently; following commands appropriately.  So far tolerating diet without signs of overt bleeding or worsening abdominal pain.  No bowel movement yet.  Passing gas. Respiratory system: Clear to auscultation. Respiratory effort normal.  Good saturation on room air. Cardiovascular system:Rate controlled. No rubs or gallops; no JVD. Gastrointestinal system: Abdomen is nondistended, soft and with positive bowel sounds.  No guarding. Central nervous system: No new focal deficit. Extremities: No cyanosis or clubbing. Skin: No petechiae. Psychiatry:  Judgement and insight appear secondary to underlying dementia.  Data Reviewed: I have personally reviewed following labs and imaging studies  Basic Metabolic Panel: Recent Labs  Lab 11/25/22 0931 11/26/22 0537 11/27/22 0438 11/28/22 0446 11/29/22 0436  NA 137 136 136 140 137  K 3.5 3.7 3.7 3.3* 3.3*  CL 96* 103 101 104 101  CO2 30 26 26 28 26   GLUCOSE 202* 114* 158* 99 110*  BUN 8 21 17 19 16   CREATININE 0.90 0.86 0.83 0.87 0.81  CALCIUM 8.5* 8.4* 8.5* 8.2* 8.4*  MG  --   --  1.9 1.7 1.7   Liver Function Tests: Recent Labs  Lab 11/25/22 0931  AST 20  ALT 21  ALKPHOS 71  BILITOT 0.7  PROT 7.5  ALBUMIN 4.2   Coagulation Profile: Recent Labs  Lab 11/25/22 0931  INR 1.0   CBC: Recent Labs  Lab 11/25/22 0931 11/26/22 0537 11/27/22 0438 11/27/22 1220 11/28/22 0446 11/29/22 0436 11/30/22 1111  WBC 14.2* 18.5* 11.4*  --  8.6 7.8  --   NEUTROABS 12.8*  --   --   --   --   --   --   HGB 15.1 11.6* 12.9* 13.2 12.5* 12.1* 12.3*  HCT 45.7 35.2* 38.2* 39.3 37.3* 37.2* 37.1*  MCV 90.5 89.6 87.8  --  89.7 90.3  --   PLT 148* 135* 144*  --  149* 168  --    Cardiac Enzymes: No results for input(s): "CKTOTAL", "CKMB", "CKMBINDEX", "TROPONINI" in the last 168 hours.  Urine  analysis:    Component Value Date/Time   COLORURINE YELLOW 06/08/2022 1742   APPEARANCEUR CLEAR 06/08/2022 1742   LABSPEC 1.026 06/08/2022 1742   PHURINE 6.0 06/08/2022 1742   GLUCOSEU NEGATIVE 06/08/2022 1742   HGBUR SMALL (A) 06/08/2022 1742   BILIRUBINUR NEGATIVE 06/08/2022 1742   KETONESUR 5 (A) 06/08/2022 1742   PROTEINUR 100 (A) 06/08/2022 1742   UROBILINOGEN 0.2 02/19/2013 1857   NITRITE NEGATIVE 06/08/2022 1742   LEUKOCYTESUR NEGATIVE 06/08/2022 1742   Sepsis Labs: @LABRCNTIP (procalcitonin:4,lacticidven:4) ) Recent Results (from the past 240 hour(s))  Blood Culture (routine x 2)     Status: None   Collection Time: 11/25/22  9:31 AM   Specimen: Left Antecubital; Blood  Result  Value Ref Range Status   Specimen Description   Final    LEFT ANTECUBITAL BOTTLES DRAWN AEROBIC AND ANAEROBIC   Special Requests Blood Culture adequate volume  Final   Culture   Final    NO GROWTH 5 DAYS Performed at [facility], [address] [place], [place] [address]    Report Status 11/30/2022 FINAL  Final  Blood Culture (routine x 2)     Status: None   Collection Time: 11/25/22  9:31 AM   Specimen: BLOOD  Result Value Ref Range Status   Specimen Description BLOOD  Final   Special Requests NONE  Final   Culture   Final    NO GROWTH 5 DAYS Performed at [facility], [address], [place], [place] [address]    Report Status 11/30/2022 FINAL  Final  SARS Coronavirus 2 by RT PCR (hospital order, performed in [facility] hospital lab) *cepheid single result test* Anterior Nasal Swab     Status: None   Collection Time: 11/25/22  9:31 AM   Specimen: Anterior Nasal Swab  Result Value Ref Range Status   SARS Coronavirus 2 by RT PCR NEGATIVE NEGATIVE Final    Comment: (NOTE) SARS-CoV-2 target nucleic acids are NOT DETECTED.  The SARS-CoV-2 RNA is generally detectable in upper and lower respiratory specimens during the acute phase of infection. The lowest concentration of SARS-CoV-2 viral copies this assay can detect is 250 copies / mL. A negative result does not preclude SARS-CoV-2 infection and should not be used as the sole basis for treatment or other patient management decisions.  A negative result may occur with improper specimen collection / handling, submission of specimen other than nasopharyngeal swab, presence of viral mutation(s) within the areas targeted by this assay, and inadequate number of viral copies (<250 copies / mL). A negative result must be combined with clinical observations, patient history, and epidemiological information.  Fact Sheet for Patients:   RoadLapTop.co.za  Fact Sheet for Healthcare  Providers: http://kim-miller.com/  This test is not yet approved or  cleared by the [place] FDA and has been authorized for detection and/or diagnosis of SARS-CoV-2 by FDA under an Emergency Use Authorization (EUA).  This EUA will remain in effect (meaning this test can be used) for the duration of the COVID-19 declaration under Section 564(b)(1) of the Act, 21 U.S.C. section 360bbb-3(b)(1), unless the authorization is terminated or revoked sooner.  Performed at [facility], [address], [place], [place] [address]   MRSA Next Gen by PCR, Nasal     Status: None   Collection Time: 11/25/22  1:15 PM   Specimen: Nasal Mucosa; Nasal Swab  Result Value Ref Range Status   MRSA by PCR Next Gen NOT DETECTED NOT DETECTED Final    Comment: (NOTE)  The GeneXpert MRSA Assay (FDA approved for NASAL specimens only), is one component of a comprehensive MRSA colonization surveillance program. It is not intended to diagnose MRSA infection nor to guide or monitor treatment for MRSA infections. Test performance is not FDA approved in patients less than 84 years old. Performed at [facility], [address] [place], [place] [address]      Scheduled Meds:  arformoterol  15 mcg Nebulization BID   atorvastatin  20 mg Oral QHS   budesonide (PULMICORT) nebulizer solution  0.5 mg Nebulization BID   cloNIDine  0.2 mg Oral BID   cyanocobalamin  500 mcg Oral Daily   enoxaparin (LOVENOX) injection  40 mg Subcutaneous Q24H   ipratropium-albuterol  3 mL Nebulization BID   memantine  10 mg Oral BID   metoCLOPramide (REGLAN) injection  5 mg Intravenous Q6H   ondansetron (ZOFRAN) IV  4 mg Intravenous Q6H   pantoprazole (PROTONIX) IV  40 mg Intravenous Q12H   polyethylene glycol  17 g Oral Daily   tamsulosin  0.4 mg Oral Daily   venlafaxine XR  75 mg Oral Daily   Procedures/Studies: CT ABDOMEN PELVIS WO CONTRAST  Result Date: 11/27/2022 CLINICAL DATA:  Altered mental status,  vomiting EXAM: CT ABDOMEN AND PELVIS WITHOUT CONTRAST TECHNIQUE: Multidetector CT imaging of the abdomen and pelvis was performed following the standard protocol without IV contrast. RADIATION DOSE REDUCTION: This exam was performed according to the departmental dose-optimization program which includes automated exposure control, adjustment of the mA and/or kV according to patient size and/or use of iterative reconstruction technique. COMPARISON:  06/08/2022 FINDINGS: Lower chest: Scarring in the bilateral lung bases with chronic left pleural calcifications. Hepatobiliary: Unenhanced liver is unremarkable. Layering tiny gallstones (series 2/image 25), without associated inflammatory changes. No intrahepatic or extrahepatic duct dilatation. Pancreas: Within normal limits. Spleen: Within normal limits. Adrenals/Urinary Tract: Adrenal glands are within normal limits. 2 mm nonobstructing left upper pole renal calculus (series 2/image 33). 15 mm simple left lower pole renal cyst (series 2/image 43), benign (Bosniak I). No follow-up is recommended. Right kidney is within normal limits. No hydronephrosis. Bladder is within normal limits. Stomach/Bowel: Stomach is notable for a moderate hiatal hernia. Mildly prominent/dilated loops of small bowel in the anterior mid abdomen with mild (series 2/image 46) with mild jejunal mesentery stranding/trace interloop fluid (series 2/image 46). Proximally, the stomach and duodenum are not dilated. As such, this appearance favors small bowel enteritis over partial small bowel obstruction. No pneumatosis or free air. Normal appendix (series 2/image 47). Scattered left colonic diverticulosis, without evidence of diverticulitis. Vascular/Lymphatic: No evidence of abdominal aortic aneurysm. Atherosclerotic calcifications of the abdominal aorta and branch vessels. No suspicious abdominopelvic lymphadenopathy. Reproductive: Prostate is unremarkable. Other: No abdominopelvic ascites.  Musculoskeletal: Degenerative changes of the visualized thoracolumbar spine. Mild superior endplate changes with small node deformity at T12. IMPRESSION: Mildly dilated loops of small bowel in the central abdomen, favoring small bowel enteritis over partial small bowel obstruction. No pneumatosis or free air. Moderate hiatal hernia. 2 mm nonobstructing left upper pole renal calculus. No hydronephrosis. Cholelithiasis, without associated inflammatory changes. Electronically Signed   By: Charline Bills M.D.   On: 11/27/2022 11:01   DG Chest Port 1 View  Result Date: 11/27/2022 CLINICAL DATA:  Nasogastric tube placement EXAM: PORTABLE CHEST 1 VIEW COMPARISON:  11/25/2022, CT 06/08/2022 FINDINGS: Moderate hiatal hernia. Accounting for this, nasogastric tube tip is within the a mid body of the stomach. Proximal side hole is likely within the proximal body  of the stomach at the diaphragmatic hiatus peer visualized lung bases are clear. Cardiac size within normal limits. IMPRESSION: 1. Nasogastric tube tip within the mid body of the stomach. Electronically Signed   By: Helyn Numbers M.D.   On: 11/27/2022 04:03   ECHOCARDIOGRAM COMPLETE  Result Date: 11/26/2022    ECHOCARDIOGRAM REPORT   Patient Name:   SEIICHI DEBUSK Date of Exam: 11/26/2022 Medical Rec #:  [id number]         Height:       72.0 in Accession #:    [id number]        Weight:       237.9 lb Date of Birth:  1960/11/19         BSA:          2.294 m Patient Age:    61 years          BP:           111/71 mmHg Patient Gender: M                 HR:           88 bpm. [facility] Procedure: 2D Echo, Cardiac Doppler and Color Doppler Indications:     Elevated Troponin  History:         Patient has no prior history of Echocardiogram examinations.                  Sepsis and Stroke, Signs/Symptoms:Altered Mental Status; Risk                  Factors:Current Smoker, Hypertension and Dyslipidemia.  Sonographer:     Aron Baba Referring Phys:   [phone] DAVID TAT Diagnosing Phys: Dina Rich MD  Sonographer Comments: Technically difficult study due to poor echo windows and no subcostal window. Image acquisition challenging due to uncooperative patient. IMPRESSIONS  1. Left ventricular ejection fraction, by estimation, is >75%. The left ventricle has hyperdynamic function. The left ventricle has no regional wall motion abnormalities. Left ventricular diastolic parameters are consistent with Grade I diastolic dysfunction (impaired relaxation).  2. RV not well visualized. Grossly appears normal in size and function. Indeterminate PASP, IVC poorly visualized. . Right ventricular systolic function was not well visualized. The right ventricular size is not well visualized.  3. The mitral valve was not well visualized. No evidence of mitral valve regurgitation. No evidence of mitral stenosis.  4. The tricuspid valve is abnormal.  5. The aortic valve was not well visualized. Aortic valve regurgitation is not visualized. No aortic stenosis is present.  6. Limited visualization of the ascending aortic aneurysm, consider additional imaging with CTA or MRA. Marland Kitchen Aortic dilatation noted. There is mild dilatation of the aortic root, measuring 42 mm. 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 FINDINGS  Left Ventricle: Left ventricular ejection fraction, by estimation, is >75%. The left ventricle has hyperdynamic function. The left ventricle has no regional wall motion abnormalities. The left ventricular internal cavity size was normal in size. There is no left ventricular hypertrophy. Left ventricular diastolic parameters are consistent with Grade I diastolic dysfunction (impaired relaxation). Normal left ventricular filling pressure. Right Ventricle: RV not well visualized. Grossly appears normal in size and function. Indeterminate PASP, IVC poorly visualized. The right ventricular size is not well visualized. Right vetricular wall  thickness was not well visualized. Right ventricular  systolic function was not well visualized. Left Atrium: Left atrial  size was normal in size. Right Atrium: Right atrial size was normal in size. Pericardium: There is no evidence of pericardial effusion. Mitral Valve: The mitral valve was not well visualized. No evidence of mitral valve regurgitation. No evidence of mitral valve stenosis. Tricuspid Valve: The tricuspid valve is abnormal. Tricuspid valve regurgitation is mild . No evidence of tricuspid stenosis. Aortic Valve: The aortic valve was not well visualized. Aortic valve regurgitation is not visualized. No aortic stenosis is present. Aortic valve mean gradient measures 3.4 mmHg. Aortic valve peak gradient measures 7.0 mmHg. Aortic valve area, by VTI measures 1.97 cm. Pulmonic Valve: The pulmonic valve was not well visualized. Pulmonic valve regurgitation is not visualized. No evidence of pulmonic stenosis. Aorta: Limited visualization of the ascending aortic aneurysm, consider additional imaging with CTA or MRA. Aortic dilatation noted. There is mild dilatation of the aortic root, measuring 42 mm. 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 Venous: The inferior vena cava was not well visualized. IAS/Shunts: The interatrial septum was not well visualized.  LEFT VENTRICLE PLAX 2D LVIDd:         4.50 cm   Diastology LVIDs:         3.30 cm   LV e' medial:    6.96 cm/s LV PW:         1.00 cm   LV E/e' medial:  10.0 LV IVS:        1.00 cm   LV e' lateral:   9.03 cm/s LVOT diam:     1.90 cm   LV E/e' lateral: 7.7 LV SV:         54 LV SV Index:   23 LVOT Area:     2.84 cm  RIGHT VENTRICLE RV S prime:     20.80 cm/s LEFT ATRIUM           Index        RIGHT ATRIUM           Index LA diam:      4.60 cm 2.01 cm/m   RA Area:     27.40 cm LA Vol (A2C): 37.6 ml 16.39 ml/m  RA Volume:   95.60 ml  41.68 ml/m LA Vol (A4C): 60.8 ml 26.51 ml/m  AORTIC VALVE AV Area (Vmax):    2.19  cm AV Area (Vmean):   2.25 cm AV Area (VTI):     1.97 cm AV Vmax:           131.84 cm/s AV Vmean:          83.772 cm/s AV VTI:            0.272 m AV Peak Grad:      7.0 mmHg AV Mean Grad:      3.4 mmHg LVOT Vmax:         102.00 cm/s LVOT Vmean:        66.500 cm/s LVOT VTI:          0.189 m LVOT/AV VTI ratio: 0.69  AORTA Ao Root diam: 4.20 cm Ao Asc diam:  5.00 cm MITRAL VALVE                TRICUSPID VALVE MV Area (PHT): 3.39 cm     TR Peak grad:   19.2 mmHg MV Decel Time: 224 msec     TR Vmax:        219.00 cm/s MR Peak grad: 3.0 mmHg MR Vmax:  86.00 cm/s    SHUNTS MV E velocity: 69.80 cm/s   Systemic VTI:  0.19 m MV A velocity: 102.00 cm/s  Systemic Diam: 1.90 cm MV E/A ratio:  0.68 Dina Rich MD Electronically signed by Dina Rich MD Signature Date/Time: 11/26/2022/12:40:08 PM    Final (Updated)    DG Chest Port 1 View  Result Date: 11/25/2022 CLINICAL DATA:  62 year old male with history of altered mental status. Possible sepsis. EXAM: PORTABLE CHEST 1 VIEW COMPARISON:  Chest x-ray 06/08/2022. FINDINGS: Opacity at the left base which may reflect areas of atelectasis and/or consolidation, with superimposed small left pleural effusion. Diffuse interstitial prominence with widespread peribronchial cuffing. Ill-defined opacity in the right mid lung. No pneumothorax. No evidence of pulmonary edema. Heart size appears borderline enlarged. Moderate hiatal hernia. Mediastinal contours are distorted by patient positioning. Atherosclerotic calcifications are noted in the thoracic aorta. IMPRESSION: 1. Diffuse interstitial prominence and peribronchial cuffing, concerning for an acute bronchitis. Ill-defined opacity in the right mid lung opacity at the left lung base concerning for developing multilobar bronchopneumonia. 2. Small left pleural effusion. Electronically Signed   By: Trudie Reed M.D.   On: 11/25/2022 09:44    Vassie Loll, MD  [facility]  If 7PM-7AM, please contact  night-coverage [email or web address] Password [email or web address] 11/30/2022, 4:14 PM   LOS: 5 days

## 2022-11-30 NOTE — Care Management Important Message (Signed)
Important Message  Patient Details  Name: Warren Sullivan MRN: [id number] Date of Birth: 08-Feb-1961   Medicare Important Message Given:  Yes     Corey Harold 11/30/2022, 9:19 AM

## 2022-11-30 NOTE — Telephone Encounter (Signed)
Please arrange hospital follow-up this patient in 2-3 weeks with me or Dr. Levon Hedger.  If no availability can be with any other APP.  May need to get in contact with [facility] where he resides to ensure they are aware of appointment.  Brooke Bonito, MSN, APRN, FNP-BC, AGACNP-BC [facility] at [facility]

## 2022-12-01 DIAGNOSIS — E785 Hyperlipidemia, unspecified: Secondary | ICD-10-CM | POA: Diagnosis not present

## 2022-12-01 DIAGNOSIS — J9601 Acute respiratory failure with hypoxia: Secondary | ICD-10-CM | POA: Diagnosis not present

## 2022-12-01 DIAGNOSIS — F17200 Nicotine dependence, unspecified, uncomplicated: Secondary | ICD-10-CM | POA: Diagnosis not present

## 2022-12-01 DIAGNOSIS — A419 Sepsis, unspecified organism: Secondary | ICD-10-CM | POA: Diagnosis not present

## 2022-12-01 MED ORDER — BISACODYL 10 MG RE SUPP
10.0000 mg | Freq: Once | RECTAL | Status: DC
  Filled 2022-12-01: qty 1

## 2022-12-01 MED ORDER — METOCLOPRAMIDE HCL 5 MG PO TABS: 5.0000 mg | ORAL_TABLET | Freq: Three times a day (TID) | ORAL | 1 refills | Status: DC

## 2022-12-01 MED ORDER — POLYETHYLENE GLYCOL 3350 17 G PO PACK: 17.0000 g | PACK | Freq: Every day | ORAL | 0 refills | Status: AC

## 2022-12-01 NOTE — Plan of Care (Signed)

## 2022-12-01 NOTE — Discharge Summary (Signed)
Physician Discharge Summary   Patient: Warren Sullivan MRN: [id number] DOB: 07/15/1960  Admit date:     11/25/2022  Discharge date: 12/01/22  Discharge Physician: Vassie Loll   PCP: Sherol Dade, DO (Inactive)   Recommendations at discharge:  Repeat basic metabolic panel to follow electrolytes and renal function Reassess blood pressure and adjust antihypertensive treatment as needed Repeat CBC to follow hemoglobin trend/stability Outpatient follow-up with gastroenterology service as instructed.  Discharge Diagnoses: Principal Problem:   Acute respiratory failure with hypoxia and hypercarbia (HCC) Active Problems:   Essential hypertension   Tobacco dependence   Hyperlipidemia, unspecified   Dementia without behavioral disturbance (HCC)   Sepsis due to undetermined organism (HCC)   Lobar pneumonia (HCC)   Coffee ground emesis  Hospital Course: 62 year old with a history of COPD, CVA with left hemiparesis, dementia, hypertension, hyperlipidemia, GERD, BPH presenting from [facility] with hypoxia and possible altered mental status.  Apparently, the patient was noted by staff at [facility] to have some altered mentation.  His vitals were checked, and he was noted to have hypoxia.  Upon EMS arrival, patient had oxygen saturation of 78% on room air.  He was placed on nonrebreather initially,, and he was transported to the emergency department for further evaluation and treatment.  The patient himself denies any fever, chills, chest pain, shortness of breath, nausea, vomiting, diarrhea, abdominal pain, dysuria, hematuria.  He does have a chronic cough.  Patient's sisters are at the bedside to assist with history.  They state that the patient has smoked since he was 62 years old.  They were not able to clarify, she smoked, but they stated that "he smoked like a chimney."  The patient continues to smoke about 4 cigarettes/day. At the bedside, the patient's sisters state that his  mental status is at baseline.  At baseline, the patient is wheelchair-bound.  He does not walk.  He requires assistance with transfers. In the ED, the patient was febrile up to 102.4 F with tachycardia 110-120.  He has soft blood pressures with SBP in the 100s.  Oxygen saturation is 94-95% on 3 L.  WBC 14.2, hemoglobin 15.1, platelets 1-40,000.  LFTs were unremarkable.  Sodium 137, potassium 3.5, bicarbonate 30, serum creatinine 0.90.  Chest x-ray showed bilateral infiltrates VBG showed 7.3/69/<31/32.  EKG showed sinus tachycardia with nonspecific T wave changes.  BNP 36.  Lactic acid 1.6.  The patient was started on IV fluids and IV ceftriaxone and azithromycin.  His respiratory status gradually improved.   Assessment and Plan: Acute respiratory failure with hypoxia and hypercarbia -Secondary to pneumonia in the setting of COPD -Wean oxygen for saturation greater 92% -Speaking in full sentences and demonstrating no acute respiratory distress. -Continue to follow clinical response. -No oxygen supplementation needed at discharge; good saturation on room air.   Severe sepsis -Present on admission -Presented with fever, tachycardia, leukocytosis and respiratory failure -Secondary to pneumonia -Lactic acid peaked 1.6 -Check procalcitonin 0.57 -Continue to maintain adequate hydration, flutter valve and supportive care. -Patient has completed antibiotic therapy while in house; sepsis features resolved.   Intractable vomiting -Continue as needed antiemetics and the use of PPI -Appreciate GI service assistance and recommendations -Patient has tolerated clear liquid diet; after discussing with GI started on daily MiraLAX, continue PPI and continue the use of Reglan while advancing diet. -Discharged back to skilled nursing facility with GI service outpatient follow-up for anticipated endoscopic evaluation down the road.   Lobar pneumonia -No fever, no oxygen supplementation required  at the  moment; good saturation on room air appreciated. -Patient WBCs within normal limit. -Patient has completed antibiotic therapy using Rocephin and Zithromax while in house. -Continue the use of flutter valve.   COPD -Continue as needed bronchodilator management. -No wheezing appreciated on exam. -Patient is speaking in full sentences. -Good saturation on room air appreciated.   Mixed hyperlipidemia -Continue statin -Heart healthy diet discussed with patient.   Dementia without behavioral disturbance -Continue Namenda -Continue constant reorientation; patient following commands appropriately and demonstrating to be in no acute distress..   Essential hypertension -Continue home antihypertensive agents -Heart healthy/low-sodium diet discussed with patient.   Depression/anxiety -Continue venlafaxine -Stable mood. -No hallucinations.   BPH -Continue tamsulosin -Complaints of urinary retention at the moment.   Hyperglycemia/Impaired glucose tolerance -Checked hemoglobin A1c--5.8 -Follow CBG fluctuation. -Continue to follow modified carbohydrate diet.  Consultants: Gastroenterology service Procedures performed: See below for x-ray reports. Disposition: Skilled nursing facility (long-term resident). Diet recommendation: Heart healthy/modified carbohydrate diet (dysphagia 3 with thin liquids consistency)  DISCHARGE MEDICATION: Allergies as of 12/01/2022   No Known Allergies      Medication List     STOP taking these medications    vitamin B-12 500 MCG tablet Commonly known as: CYANOCOBALAMIN       TAKE these medications    acetaminophen 650 MG CR tablet Commonly known as: TYLENOL Take 1,300 mg by mouth every 8 (eight) hours as needed (general discomfort).   albuterol (2.5 MG/3ML) 0.083% nebulizer solution Commonly known as: PROVENTIL Take 3 mLs (2.5 mg total) by nebulization every 4 (four) hours as needed for wheezing or shortness of breath.   atorvastatin 20  MG tablet Commonly known as: LIPITOR Take 1 tablet (20 mg total) by mouth at bedtime.   CALCIUM 500 PO Take 1 tablet by mouth daily.   Cholecalciferol 25 MCG (1000 UT) capsule Take 5,000 Units by mouth daily.   cloNIDine 0.1 MG tablet Commonly known as: CATAPRES Take 2 tablets (0.2 mg total) by mouth 2 (two) times daily.   gabapentin 300 MG capsule Commonly known as: NEURONTIN Take 1 capsule (300 mg total) by mouth 3 (three) times daily. Start with one at bedtime for 1 week. Then two at bedtime for one week, then go to full dose.   memantine 10 MG tablet Commonly known as: NAMENDA Take 10 mg by mouth 2 (two) times daily.   metoCLOPramide 5 MG tablet Commonly known as: Reglan Take 1 tablet (5 mg total) by mouth 3 (three) times daily.   multivitamin tablet Take 1 tablet by mouth daily.   ondansetron 4 MG tablet Commonly known as: ZOFRAN Take 4 mg by mouth every 8 (eight) hours as needed for nausea or vomiting.   pantoprazole 40 MG tablet Commonly known as: Protonix Take 1 tablet (40 mg total) by mouth 2 (two) times daily.   polyethylene glycol 17 g packet Commonly known as: MIRALAX / GLYCOLAX Take 17 g by mouth daily. Start taking on: Dec 02, 2022   tamsulosin 0.4 MG Caps capsule Commonly known as: FLOMAX Take 0.4 mg by mouth daily.   Vascepa 1 g capsule Generic drug: icosapent Ethyl Take 2 g by mouth 2 (two) times daily.   Venlafaxine HCl 75 MG Tb24 Take 75 mg by mouth daily.        Contact information for follow-up providers     Simpson-Tarokh, Leann, DO Follow up.   Specialty: Family Medicine Contact information: [address] [place] [address] [phone]  Dolores Frame, MD Follow up today.   Specialty: Gastroenterology Why: Office will contact with appointment details. Contact information: [address] [place] [address] [phone]              Contact information for after-discharge  care     Destination     [facility] Preferred SNF .   Service: Skilled Nursing Contact information: [address] [place] [address] [phone]                    Discharge Exam: Ceasar Mons Weights   11/25/22 0917  Weight: 107.9 kg   General exam: Oriented x 1-2 intermittently (baseline); following commands appropriately.  So far tolerating diet without signs of overt bleeding or worsening abdominal pain.  Passing gas and medically stable for discharge. Respiratory system: Clear to auscultation. Respiratory effort normal.  Good saturation on room air. Cardiovascular system:Rate controlled. No rubs or gallops; no JVD. Gastrointestinal system: Abdomen is nondistended, soft and with positive bowel sounds.  No guarding. Central nervous system: No new focal deficit. Extremities: No cyanosis or clubbing. Skin: No petechiae. Psychiatry: Judgement and insight appear secondary to underlying dementia.  Condition at discharge: Stable and improved.  The results of significant diagnostics from this hospitalization (including imaging, microbiology, ancillary and laboratory) are listed below for reference.   Imaging Studies: CT ABDOMEN PELVIS WO CONTRAST  Result Date: 11/27/2022 CLINICAL DATA:  Altered mental status, vomiting EXAM: CT ABDOMEN AND PELVIS WITHOUT CONTRAST TECHNIQUE: Multidetector CT imaging of the abdomen and pelvis was performed following the standard protocol without IV contrast. RADIATION DOSE REDUCTION: This exam was performed according to the departmental dose-optimization program which includes automated exposure control, adjustment of the mA and/or kV according to patient size and/or use of iterative reconstruction technique. COMPARISON:  06/08/2022 FINDINGS: Lower chest: Scarring in the bilateral lung bases with chronic left pleural calcifications. Hepatobiliary: Unenhanced liver is unremarkable. Layering  tiny gallstones (series 2/image 25), without associated inflammatory changes. No intrahepatic or extrahepatic duct dilatation. Pancreas: Within normal limits. Spleen: Within normal limits. Adrenals/Urinary Tract: Adrenal glands are within normal limits. 2 mm nonobstructing left upper pole renal calculus (series 2/image 33). 15 mm simple left lower pole renal cyst (series 2/image 43), benign (Bosniak I). No follow-up is recommended. Right kidney is within normal limits. No hydronephrosis. Bladder is within normal limits. Stomach/Bowel: Stomach is notable for a moderate hiatal hernia. Mildly prominent/dilated loops of small bowel in the anterior mid abdomen with mild (series 2/image 46) with mild jejunal mesentery stranding/trace interloop fluid (series 2/image 46). Proximally, the stomach and duodenum are not dilated. As such, this appearance favors small bowel enteritis over partial small bowel obstruction. No pneumatosis or free air. Normal appendix (series 2/image 47). Scattered left colonic diverticulosis, without evidence of diverticulitis. Vascular/Lymphatic: No evidence of abdominal aortic aneurysm. Atherosclerotic calcifications of the abdominal aorta and branch vessels. No suspicious abdominopelvic lymphadenopathy. Reproductive: Prostate is unremarkable. Other: No abdominopelvic ascites. Musculoskeletal: Degenerative changes of the visualized thoracolumbar spine. Mild superior endplate changes with small node deformity at T12. IMPRESSION: Mildly dilated loops of small bowel in the central abdomen, favoring small bowel enteritis over partial small bowel obstruction. No pneumatosis or free air. Moderate hiatal hernia. 2 mm nonobstructing left upper pole renal calculus. No hydronephrosis. Cholelithiasis, without associated inflammatory changes. Electronically Signed   By: Charline Bills M.D.   On: 11/27/2022 11:01   DG Chest Port 1 View  Result Date: 11/27/2022  CLINICAL DATA:  Nasogastric tube placement  EXAM: PORTABLE CHEST 1 VIEW COMPARISON:  11/25/2022, CT 06/08/2022 FINDINGS: Moderate hiatal hernia. Accounting for this, nasogastric tube tip is within the a mid body of the stomach. Proximal side hole is likely within the proximal body of the stomach at the diaphragmatic hiatus peer visualized lung bases are clear. Cardiac size within normal limits. IMPRESSION: 1. Nasogastric tube tip within the mid body of the stomach. Electronically Signed   By: Helyn Numbers M.D.   On: 11/27/2022 04:03   ECHOCARDIOGRAM COMPLETE  Result Date: 11/26/2022    ECHOCARDIOGRAM REPORT   Patient Name:   Warren Sullivan Date of Exam: 11/26/2022 Medical Rec #:  [id number]         Height:       72.0 in Accession #:    [id number]        Weight:       237.9 lb Date of Birth:  Feb 13, 1961         BSA:          2.294 m Patient Age:    61 years          BP:           111/71 mmHg Patient Gender: M                 HR:           88 bpm. [facility] Procedure: 2D Echo, Cardiac Doppler and Color Doppler Indications:     Elevated Troponin  History:         Patient has no prior history of Echocardiogram examinations.                  Sepsis and Stroke, Signs/Symptoms:Altered Mental Status; Risk                  Factors:Current Smoker, Hypertension and Dyslipidemia.  Sonographer:     Aron Baba Referring Phys:  [phone] DAVID TAT Diagnosing Phys: Dina Rich MD  Sonographer Comments: Technically difficult study due to poor echo windows and no subcostal window. Image acquisition challenging due to uncooperative patient. IMPRESSIONS  1. Left ventricular ejection fraction, by estimation, is >75%. The left ventricle has hyperdynamic function. The left ventricle has no regional wall motion abnormalities. Left ventricular diastolic parameters are consistent with Grade I diastolic dysfunction (impaired relaxation).  2. RV not well visualized. Grossly appears normal in size and function. Indeterminate PASP, IVC poorly visualized. . Right  ventricular systolic function was not well visualized. The right ventricular size is not well visualized.  3. The mitral valve was not well visualized. No evidence of mitral valve regurgitation. No evidence of mitral stenosis.  4. The tricuspid valve is abnormal.  5. The aortic valve was not well visualized. Aortic valve regurgitation is not visualized. No aortic stenosis is present.  6. Limited visualization of the ascending aortic aneurysm, consider additional imaging with CTA or MRA. Marland Kitchen Aortic dilatation noted. There is mild dilatation of the aortic root, measuring 42 mm. 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 FINDINGS  Left Ventricle: Left ventricular ejection fraction, by estimation, is >75%. The left ventricle has hyperdynamic function. The left ventricle has no regional wall motion abnormalities. The left ventricular internal cavity size was normal in size. There is no left ventricular hypertrophy. Left ventricular diastolic parameters are consistent with Grade I diastolic dysfunction (impaired relaxation). Normal left ventricular filling pressure. Right Ventricle: RV  not well visualized. Grossly appears normal in size and function. Indeterminate PASP, IVC poorly visualized. The right ventricular size is not well visualized. Right vetricular wall thickness was not well visualized. Right ventricular  systolic function was not well visualized. Left Atrium: Left atrial size was normal in size. Right Atrium: Right atrial size was normal in size. Pericardium: There is no evidence of pericardial effusion. Mitral Valve: The mitral valve was not well visualized. No evidence of mitral valve regurgitation. No evidence of mitral valve stenosis. Tricuspid Valve: The tricuspid valve is abnormal. Tricuspid valve regurgitation is mild . No evidence of tricuspid stenosis. Aortic Valve: The aortic valve was not well visualized. Aortic valve regurgitation is not visualized. No aortic  stenosis is present. Aortic valve mean gradient measures 3.4 mmHg. Aortic valve peak gradient measures 7.0 mmHg. Aortic valve area, by VTI measures 1.97 cm. Pulmonic Valve: The pulmonic valve was not well visualized. Pulmonic valve regurgitation is not visualized. No evidence of pulmonic stenosis. Aorta: Limited visualization of the ascending aortic aneurysm, consider additional imaging with CTA or MRA. Aortic dilatation noted. There is mild dilatation of the aortic root, measuring 42 mm. 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 Venous: The inferior vena cava was not well visualized. IAS/Shunts: The interatrial septum was not well visualized.  LEFT VENTRICLE PLAX 2D LVIDd:         4.50 cm   Diastology LVIDs:         3.30 cm   LV e' medial:    6.96 cm/s LV PW:         1.00 cm   LV E/e' medial:  10.0 LV IVS:        1.00 cm   LV e' lateral:   9.03 cm/s LVOT diam:     1.90 cm   LV E/e' lateral: 7.7 LV SV:         54 LV SV Index:   23 LVOT Area:     2.84 cm  RIGHT VENTRICLE RV S prime:     20.80 cm/s LEFT ATRIUM           Index        RIGHT ATRIUM           Index LA diam:      4.60 cm 2.01 cm/m   RA Area:     27.40 cm LA Vol (A2C): 37.6 ml 16.39 ml/m  RA Volume:   95.60 ml  41.68 ml/m LA Vol (A4C): 60.8 ml 26.51 ml/m  AORTIC VALVE AV Area (Vmax):    2.19 cm AV Area (Vmean):   2.25 cm AV Area (VTI):     1.97 cm AV Vmax:           131.84 cm/s AV Vmean:          83.772 cm/s AV VTI:            0.272 m AV Peak Grad:      7.0 mmHg AV Mean Grad:      3.4 mmHg LVOT Vmax:         102.00 cm/s LVOT Vmean:        66.500 cm/s LVOT VTI:          0.189 m LVOT/AV VTI ratio: 0.69  AORTA Ao Root diam: 4.20 cm Ao Asc diam:  5.00 cm MITRAL VALVE                TRICUSPID VALVE MV Area (PHT): 3.39  cm     TR Peak grad:   19.2 mmHg MV Decel Time: 224 msec     TR Vmax:        219.00 cm/s MR Peak grad: 3.0 mmHg MR Vmax:      86.00 cm/s    SHUNTS MV E velocity: 69.80 cm/s   Systemic VTI:  0.19 m MV  A velocity: 102.00 cm/s  Systemic Diam: 1.90 cm MV E/A ratio:  0.68 Dina Rich MD Electronically signed by Dina Rich MD Signature Date/Time: 11/26/2022/12:40:08 PM    Final (Updated)    DG Chest Port 1 View  Result Date: 11/25/2022 CLINICAL DATA:  62 year old male with history of altered mental status. Possible sepsis. EXAM: PORTABLE CHEST 1 VIEW COMPARISON:  Chest x-ray 06/08/2022. FINDINGS: Opacity at the left base which may reflect areas of atelectasis and/or consolidation, with superimposed small left pleural effusion. Diffuse interstitial prominence with widespread peribronchial cuffing. Ill-defined opacity in the right mid lung. No pneumothorax. No evidence of pulmonary edema. Heart size appears borderline enlarged. Moderate hiatal hernia. Mediastinal contours are distorted by patient positioning. Atherosclerotic calcifications are noted in the thoracic aorta. IMPRESSION: 1. Diffuse interstitial prominence and peribronchial cuffing, concerning for an acute bronchitis. Ill-defined opacity in the right mid lung opacity at the left lung base concerning for developing multilobar bronchopneumonia. 2. Small left pleural effusion. Electronically Signed   By: Trudie [place] M.D.   On: 11/25/2022 09:44    Microbiology: Results for orders placed or performed during the hospital encounter of 11/25/22  Blood Culture (routine x 2)     Status: None   Collection Time: 11/25/22  9:31 AM   Specimen: Left Antecubital; Blood  Result Value Ref Range Status   Specimen Description   Final    LEFT ANTECUBITAL BOTTLES DRAWN AEROBIC AND ANAEROBIC   Special Requests Blood Culture adequate volume  Final   Culture   Final    NO GROWTH 5 DAYS Performed at [facility], [address], [place], [place] [address]    Report Status 11/30/2022 FINAL  Final  Blood Culture (routine x 2)     Status: None   Collection Time: 11/25/22  9:31 AM   Specimen: BLOOD  Result Value Ref Range Status   Specimen Description  BLOOD  Final   Special Requests NONE  Final   Culture   Final    NO GROWTH 5 DAYS Performed at [facility], [address] [place], [place] [address]    Report Status 11/30/2022 FINAL  Final  SARS Coronavirus 2 by RT PCR (hospital order, performed in [facility] hospital lab) *cepheid single result test* Anterior Nasal Swab     Status: None   Collection Time: 11/25/22  9:31 AM   Specimen: Anterior Nasal Swab  Result Value Ref Range Status   SARS Coronavirus 2 by RT PCR NEGATIVE NEGATIVE Final    Comment: (NOTE) SARS-CoV-2 target nucleic acids are NOT DETECTED.  The SARS-CoV-2 RNA is generally detectable in upper and lower respiratory specimens during the acute phase of infection. The lowest concentration of SARS-CoV-2 viral copies this assay can detect is 250 copies / mL. A negative result does not preclude SARS-CoV-2 infection and should not be used as the sole basis for treatment or other patient management decisions.  A negative result may occur with improper specimen collection / handling, submission of specimen other than nasopharyngeal swab, presence of viral mutation(s) within the areas targeted by this assay, and inadequate number of viral copies (<250 copies /  mL). A negative result must be combined with clinical observations, patient history, and epidemiological information.  Fact Sheet for Patients:   RoadLapTop.co.za  Fact Sheet for Healthcare Providers: http://kim-miller.com/  This test is not yet approved or  cleared by the [place] FDA and has been authorized for detection and/or diagnosis of SARS-CoV-2 by FDA under an Emergency Use Authorization (EUA).  This EUA will remain in effect (meaning this test can be used) for the duration of the COVID-19 declaration under Section 564(b)(1) of the Act, 21 U.S.C. section 360bbb-3(b)(1), unless the authorization is terminated or revoked sooner.  Performed at [facility], [address] [place], [place] [address]   MRSA Next Gen by PCR, Nasal     Status: None   Collection Time: 11/25/22  1:15 PM   Specimen: Nasal Mucosa; Nasal Swab  Result Value Ref Range Status   MRSA by PCR Next Gen NOT DETECTED NOT DETECTED Final    Comment: (NOTE) The GeneXpert MRSA Assay (FDA approved for NASAL specimens only), is one component of a comprehensive MRSA colonization surveillance program. It is not intended to diagnose MRSA infection nor to guide or monitor treatment for MRSA infections. Test performance is not FDA approved in patients less than 80 years old. Performed at [facility], [address], [place], [place] [address]     Labs: CBC: Recent Labs  Lab 11/25/22 [phone] 11/26/22 0537 11/27/22 0438 11/27/22 1220 11/28/22 0446 11/29/22 0436 11/30/22 1111  WBC 14.2* 18.5* 11.4*  --  8.6 7.8  --   NEUTROABS 12.8*  --   --   --   --   --   --   HGB 15.1 11.6* 12.9* 13.2 12.5* 12.1* 12.3*  HCT 45.7 35.2* 38.2* 39.3 37.3* 37.2* 37.1*  MCV 90.5 89.6 87.8  --  89.7 90.3  --   PLT 148* 135* 144*  --  149* 168  --    Basic Metabolic Panel: Recent Labs  Lab 11/25/22 0931 11/26/22 0537 11/27/22 0438 11/28/22 0446 11/29/22 0436  NA 137 136 136 140 137  K 3.5 3.7 3.7 3.3* 3.3*  CL 96* 103 101 104 101  CO2 30 26 26 28 26   GLUCOSE 202* 114* 158* 99 110*  BUN 8 21 17 19 16   CREATININE 0.90 0.86 0.83 0.87 0.81  CALCIUM 8.5* 8.4* 8.5* 8.2* 8.4*  MG  --   --  1.9 1.7 1.7   Liver Function Tests: Recent Labs  Lab 11/25/22 0931  AST 20  ALT 21  ALKPHOS 71  BILITOT 0.7  PROT 7.5  ALBUMIN 4.2    Discharge time spent: greater than 30 minutes.  Signed: Vassie Loll, MD [facility] 12/01/2022

## 2022-12-01 NOTE — Progress Notes (Signed)
Nsg Discharge Note  Admit Date:  11/25/2022 Discharge date: 12/01/2022   Warren Sullivan to be D/C'd Skilled nursing facility per MD order.  AVS completed.  Copy for chart, and copy for patient signed, and dated. Patient/caregiver able to verbalize understanding.  Discharge Medication: Allergies as of 12/01/2022   No Known Allergies      Medication List     STOP taking these medications    vitamin B-12 500 MCG tablet Commonly known as: CYANOCOBALAMIN       TAKE these medications    acetaminophen 650 MG CR tablet Commonly known as: TYLENOL Take 1,300 mg by mouth every 8 (eight) hours as needed (general discomfort).   albuterol (2.5 MG/3ML) 0.083% nebulizer solution Commonly known as: PROVENTIL Take 3 mLs (2.5 mg total) by nebulization every 4 (four) hours as needed for wheezing or shortness of breath.   atorvastatin 20 MG tablet Commonly known as: LIPITOR Take 1 tablet (20 mg total) by mouth at bedtime.   CALCIUM 500 PO Take 1 tablet by mouth daily.   Cholecalciferol 25 MCG (1000 UT) capsule Take 5,000 Units by mouth daily.   cloNIDine 0.1 MG tablet Commonly known as: CATAPRES Take 2 tablets (0.2 mg total) by mouth 2 (two) times daily.   gabapentin 300 MG capsule Commonly known as: NEURONTIN Take 1 capsule (300 mg total) by mouth 3 (three) times daily. Start with one at bedtime for 1 week. Then two at bedtime for one week, then go to full dose.   memantine 10 MG tablet Commonly known as: NAMENDA Take 10 mg by mouth 2 (two) times daily.   metoCLOPramide 5 MG tablet Commonly known as: Reglan Take 1 tablet (5 mg total) by mouth 3 (three) times daily.   multivitamin tablet Take 1 tablet by mouth daily.   ondansetron 4 MG tablet Commonly known as: ZOFRAN Take 4 mg by mouth every 8 (eight) hours as needed for nausea or vomiting.   pantoprazole 40 MG tablet Commonly known as: Protonix Take 1 tablet (40 mg total) by mouth 2 (two) times daily.    polyethylene glycol 17 g packet Commonly known as: MIRALAX / GLYCOLAX Take 17 g by mouth daily. Start taking on: Dec 02, 2022   tamsulosin 0.4 MG Caps capsule Commonly known as: FLOMAX Take 0.4 mg by mouth daily.   Vascepa 1 g capsule Generic drug: icosapent Ethyl Take 2 g by mouth 2 (two) times daily.   Venlafaxine HCl 75 MG Tb24 Take 75 mg by mouth daily.        Discharge Assessment: Vitals:   12/01/22 0940 12/01/22 1032  BP: (!) 180/120 121/81  Pulse: 97   Resp:    Temp:    SpO2: 92%    Skin clean, dry and intact without evidence of skin break down, no evidence of skin tears noted. IV catheter discontinued intact. Site without signs and symptoms of complications - no redness or edema noted at insertion site, patient denies c/o pain - only slight tenderness at site.  Dressing with slight pressure applied.  D/c Instructions-Education: Discharge instructions given to patient/family with verbalized understanding. D/c education completed with patient/family including follow up instructions, medication list, d/c activities limitations if indicated, with other d/c instructions as indicated by MD - patient able to verbalize understanding, all questions fully answered. Patient instructed to return to ED, call 911, or call MD for any changes in condition.  Patient escorted via WC, and D/C home via private auto.  Demetrio Lapping, LPN  12/01/2022 11:33 AM

## 2022-12-10 IMAGING — DX DG CHEST 2V
3 series · 3 of 3 positions shown · non-contrast
Comparison: 08/28/2021

CLINICAL DATA: Shortness of breath for few days

EXAM:
CHEST - 2 VIEW

[chest lat (1 of 2)]
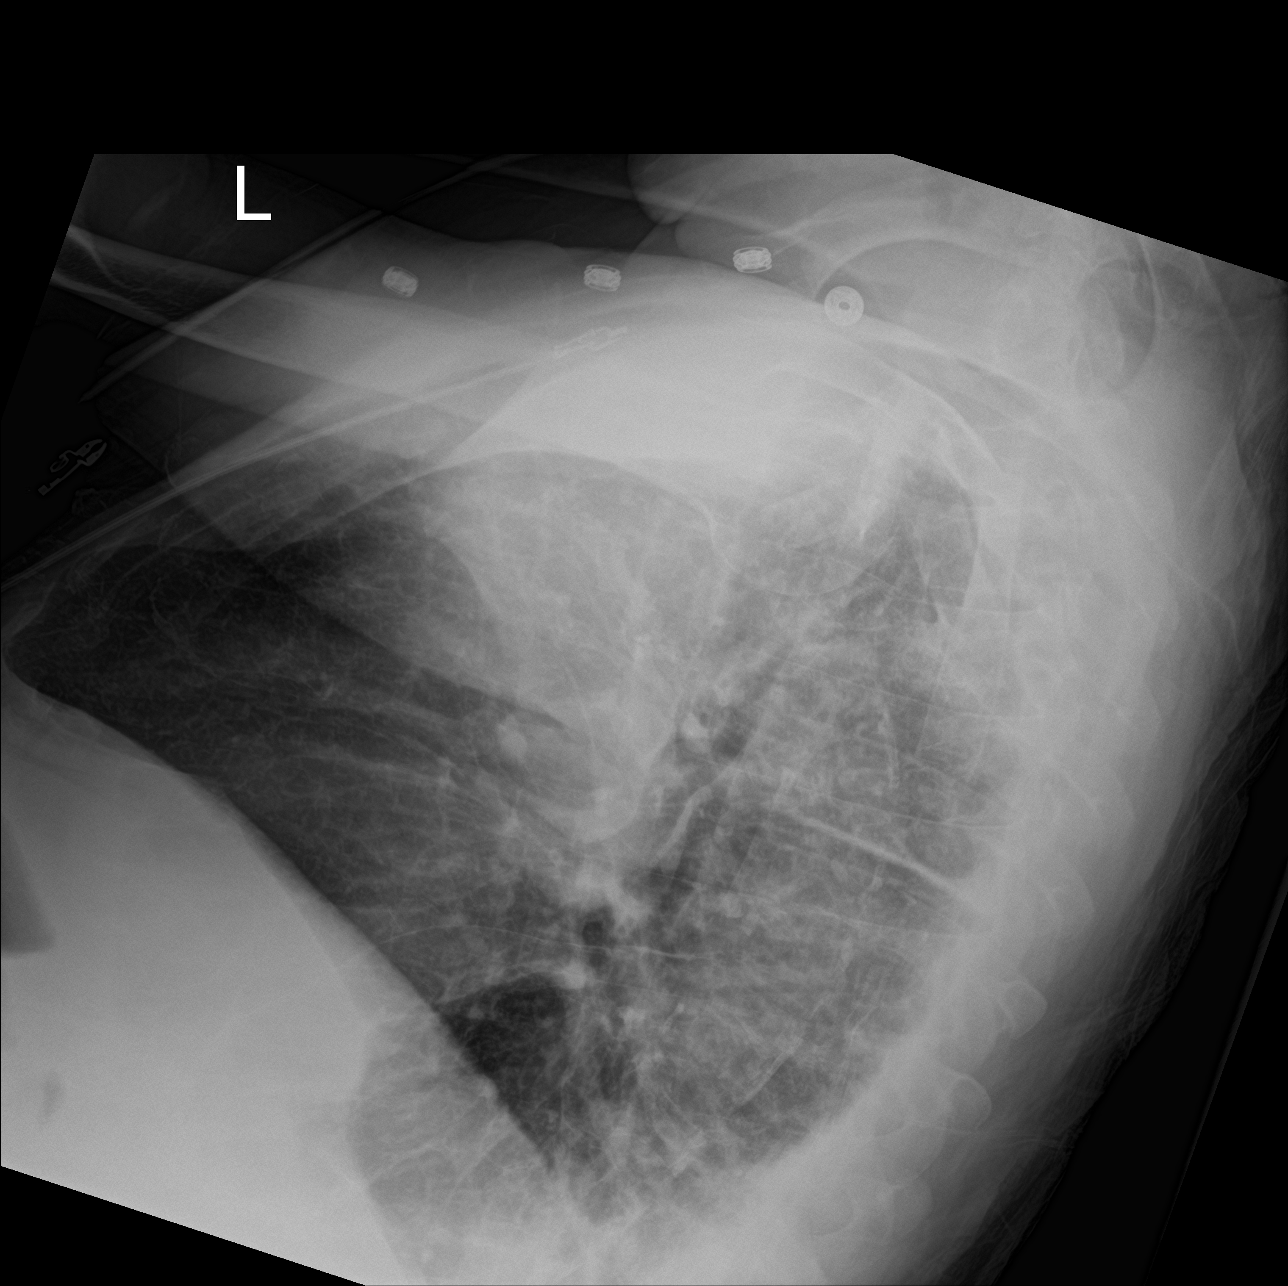

[chest ap]
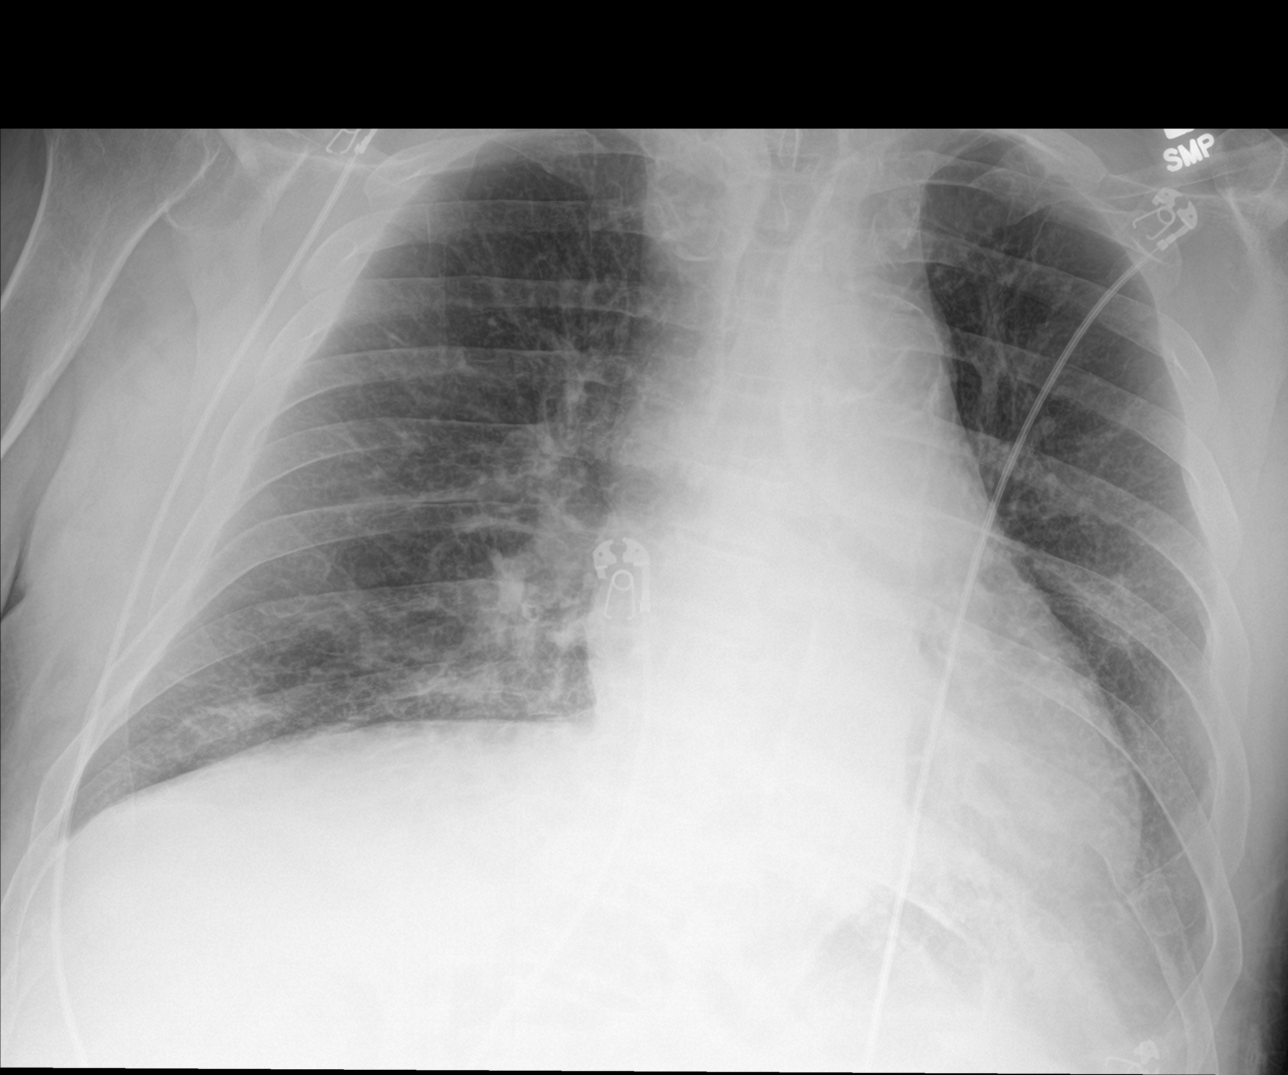

[chest lat (2 of 2)]
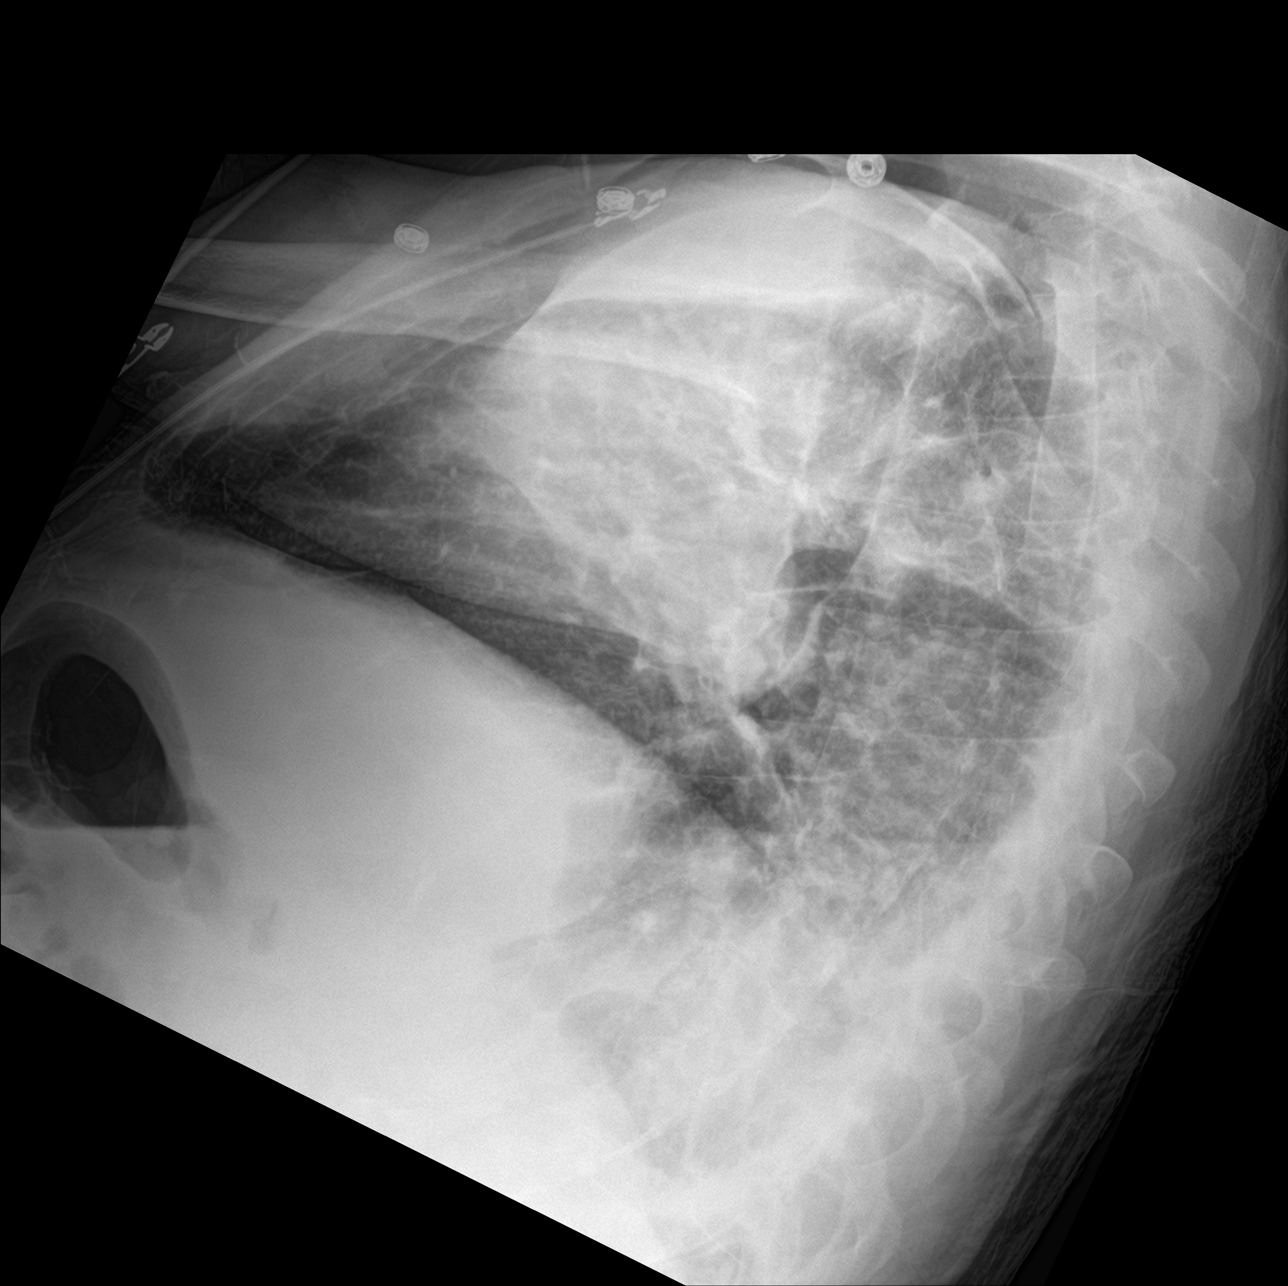

[3 of 3 positions shown; findings below may reference images not displayed]

FINDINGS: Bilateral patchy lower lobe airspace disease concerning for
multilobar pneumonia. No right pleural effusion. Trace left pleural
effusion. No pneumothorax. Stable cardiomediastinal silhouette. No
aggressive osseous lesion.
IMPRESSION: 1. Bilateral patchy lower lobe airspace disease concerning for
multilobar pneumonia.

## 2022-12-20 ENCOUNTER — Encounter: Payer: Self-pay | Admitting: Family Medicine

## 2022-12-20 ENCOUNTER — Non-Acute Institutional Stay: Payer: Medicare Other | Admitting: Family Medicine

## 2022-12-20 VITALS — BP 118/70 | HR 78 | Temp 97.6°F | Resp 20

## 2022-12-20 DIAGNOSIS — I10 Essential (primary) hypertension: Secondary | ICD-10-CM

## 2022-12-20 DIAGNOSIS — F0390 Unspecified dementia without behavioral disturbance: Secondary | ICD-10-CM

## 2022-12-20 NOTE — Progress Notes (Signed)
Therapist, nutritional Palliative Care Consult Note Telephone: [phone]  Fax: [phone]   Date of encounter: 12/20/22 3:50 PM PATIENT NAME: Warren Sullivan [facility] [address] [place] [address]   [id number] (home)  DOB: Dec 12, 1960 MRN: [id number] PRIMARY CARE PROVIDER:    Sherol Dade, DO (Inactive),  No address on file None  REFERRING PROVIDER:   No referring provider defined for this encounter. N/A  Emergency Contact:    Contact Information     Name Relation Home Work Mobile   [place] Sister   [phone]   shavon, zenz   [phone]   Everette Rank [phone]         I met face to face with patient in [facility] facility. Palliative Care was asked to follow this patient by consultation request of No ref. provider found to address advance care planning and complex medical decision making. This is a follow up visit.  ASSESSMENT AND / RECOMMENDATIONS: PPS: 50%  Dementia:  FAST 7 Score 6d Question if being weaned off Namenda.  2.    Hypertension:  BP well controlled on current regimen with Clonidine 0.1 mg (2 tabs)  Follow up Palliative Care Visit:  Palliative Care continuing to follow up by monitoring for changes in appetite, weight, functional and cognitive status for chronic disease progression and management in agreement with patient's stated goals of care. Next visit in 4 weeks or prn.  Chief Complaint : Follow up Palliative Care visit for medical management visit relating chronic conditions and comorbidities in setting of CVA with residual hemiplegia, HTN and Dementia.   HISTORY OF PRESENT ILLNESS: Warren Sullivan is a 62 y.o. year old male. No acute complaints currently.  No recent falls, nausea/vomiting, constipation or dysuria.  He is just getting back to his room after ambulating short distance behind wheelchair with PT.    ACTIVITIES OF DAILY LIVING: CONTINENT OF  BLADDER/BOWEL? No BATHING/DRESSING/FEEDING: Requires assistance with bathing and dressing, able to feed independently   MOBILITY:   Wheelchair-can transfer   APPETITE? Good   This visit was coded based on medical decision making (MDM).  CURRENT PROBLEM LIST:  Patient Active Problem List   Diagnosis Date Noted   Coffee ground emesis 11/29/2022   Acute respiratory failure with hypoxia and hypercarbia (HCC) 11/25/2022   Sepsis due to undetermined organism (HCC) 11/25/2022   Lobar pneumonia (HCC) 11/25/2022   Palliative care encounter 09/03/2022   COVID-19 virus infection 07/08/2022   Aneurysm of ascending aorta without rupture (HCC) 06/22/2022   Esophagitis 06/09/2022   Hiatal hernia 06/09/2022   Abnormal CT of the chest 06/09/2022   GI bleed 06/09/2022   Stroke (HCC) 06/08/2022   Hemiplegia, unspecified affecting right dominant side (HCC) 06/08/2022   Emesis, persistent 01/29/2022   Helicobacter pylori duodenitis 11/06/2021   Hypokalemia 08/18/2021   Hyponatremia 08/18/2021   Hypocalcemia 08/18/2021   Hyperglycemia 08/18/2021   Vomiting 08/18/2021   GERD (gastroesophageal reflux disease) 08/18/2021   Hyperlipidemia, unspecified 08/18/2021   Dementia without behavioral disturbance (HCC) 08/18/2021   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 10/19/2015   Tobacco dependence 06/21/2015   Essential hypertension 06/14/2015   PAST MEDICAL HISTORY:  Active Ambulatory Problems    Diagnosis Date Noted   Essential hypertension 06/14/2015   Tobacco dependence 06/21/2015   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 10/19/2015   Hypokalemia 08/18/2021   Hyponatremia 08/18/2021   Hypocalcemia 08/18/2021   Hyperglycemia 08/18/2021   Vomiting 08/18/2021  GERD (gastroesophageal reflux disease) 08/18/2021   Hyperlipidemia, unspecified 08/18/2021   Dementia without behavioral disturbance (HCC) 08/18/2021   Helicobacter pylori duodenitis 11/06/2021   Emesis, persistent  01/29/2022   Stroke (HCC) 06/08/2022   Hemiplegia, unspecified affecting right dominant side (HCC) 06/08/2022   Esophagitis 06/09/2022   Hiatal hernia 06/09/2022   Abnormal CT of the chest 06/09/2022   GI bleed 06/09/2022   Aneurysm of ascending aorta without rupture (HCC) 06/22/2022   COVID-19 virus infection 07/08/2022   Palliative care encounter 09/03/2022   Acute respiratory failure with hypoxia and hypercarbia (HCC) 11/25/2022   Sepsis due to undetermined organism (HCC) 11/25/2022   Lobar pneumonia (HCC) 11/25/2022   Coffee ground emesis 11/29/2022   Resolved Ambulatory Problems    Diagnosis Date Noted   Small bowel obstruction (HCC) 08/17/2021   AKI (acute kidney injury) (HCC) 08/18/2021   Esophageal mass 08/18/2021   Dehydration 08/18/2021   Pneumonia 08/28/2021   Fall at home, initial encounter 08/29/2021   Past Medical History:  Diagnosis Date   CVA (cerebral vascular accident) (HCC)    Dementia in other diseases classified elsewhere, severe, without behavioral disturbance, psychotic disturbance, mood disturbance, and anxiety (HCC)    Hyperlipidemia    Hypertension    MDD (major depressive disorder)    Vitamin D deficiency    SOCIAL HX:  Social History   Tobacco Use   Smoking status: Every Day    Packs/day: .25    Types: Cigarettes   Smokeless tobacco: Not on file  Substance Use Topics   Alcohol use: No   FAMILY HX:  Family History  Problem Relation Age of Onset   Hypertension Mother    Stroke Mother    Hypertension Father    Hypertension Sister        Preferred Pharmacy: ALLERGIES: No Known Allergies   PERTINENT MEDICATIONS:  Outpatient Encounter Medications as of 12/20/2022  Medication Sig   acetaminophen (TYLENOL) 650 MG CR tablet Take 1,300 mg by mouth every 8 (eight) hours as needed (general discomfort).   albuterol (PROVENTIL) (2.5 MG/3ML) 0.083% nebulizer solution Take 3 mLs (2.5 mg total) by nebulization every 4 (four) hours as needed for  wheezing or shortness of breath.   atorvastatin (LIPITOR) 20 MG tablet Take 1 tablet (20 mg total) by mouth at bedtime.   Calcium Carbonate (CALCIUM 500 PO) Take 1 tablet by mouth daily.   Cholecalciferol 25 MCG (1000 UT) capsule Take 5,000 Units by mouth daily.   cloNIDine (CATAPRES) 0.1 MG tablet Take 2 tablets (0.2 mg total) by mouth 2 (two) times daily.   gabapentin (NEURONTIN) 300 MG capsule Take 1 capsule (300 mg total) by mouth 3 (three) times daily. Start with one at bedtime for 1 week. Then two at bedtime for one week, then go to full dose.   memantine (NAMENDA) 10 MG tablet Take 10 mg by mouth 2 (two) times daily.   metoCLOPramide (REGLAN) 5 MG tablet Take 1 tablet (5 mg total) by mouth 3 (three) times daily.   Multiple Vitamin (MULTIVITAMIN) tablet Take 1 tablet by mouth daily.   ondansetron (ZOFRAN) 4 MG tablet Take 4 mg by mouth every 8 (eight) hours as needed for nausea or vomiting.   pantoprazole (PROTONIX) 40 MG tablet Take 1 tablet (40 mg total) by mouth 2 (two) times daily.   polyethylene glycol (MIRALAX / GLYCOLAX) 17 g packet Take 17 g by mouth daily.   tamsulosin (FLOMAX) 0.4 MG CAPS capsule Take 0.4 mg by mouth daily.  VASCEPA 1 g capsule Take 2 g by mouth 2 (two) times daily.   Venlafaxine HCl 75 MG TB24 Take 75 mg by mouth daily.   No facility-administered encounter medications on file as of 12/20/2022.   Physical Exam: GENERAL: NAD, unkempt HEENT: Edentulous  LUNGS: CTAB,  no increased respiratory effort or accessory muscle use, No SOB  CARDIAC:  S1S2, RRR with no MRG and occasional premature beat, trace pedal edema, No cyanosis ABD:  Normo-active BS x 4 quads, soft, non-tender EXTREMITIES: Decreased ROM, no deformity, weakness to BLE, No muscle atrophy/subcutaneous fat loss  NEURO:  Mild cognitive impairment, Impaired balance PSYCH:  non-anxious affect, A & O x 2, forgetful  History obtained from review of EMR, discussion with facility staff/caregiver and/or  patient.     Please call our main office at [phone] if we can be of additional assistance.    Joycelyn Man FNP-C  [email or web address] .[facility]  Phone:  [phone]

## 2023-01-03 ENCOUNTER — Encounter: Payer: Self-pay | Admitting: Gastroenterology

## 2023-01-03 ENCOUNTER — Ambulatory Visit: Payer: Medicare Other | Admitting: Gastroenterology

## 2023-01-04 ENCOUNTER — Emergency Department (HOSPITAL_COMMUNITY): Payer: Medicare Other

## 2023-01-04 ENCOUNTER — Other Ambulatory Visit: Payer: Self-pay

## 2023-01-04 ENCOUNTER — Emergency Department (HOSPITAL_COMMUNITY)
Admission: EM | Admit: 2023-01-04 | Discharge: 2023-01-04 | Disposition: A | Discharge status: Home / Self Care | Payer: Medicare Other | Attending: Emergency Medicine | Admitting: Emergency Medicine

## 2023-01-04 DIAGNOSIS — R109 Unspecified abdominal pain: Secondary | ICD-10-CM | POA: Insufficient documentation

## 2023-01-04 DIAGNOSIS — R112 Nausea with vomiting, unspecified: Secondary | ICD-10-CM | POA: Insufficient documentation

## 2023-01-04 LAB — CBC
HCT: 40.4 % (ref 39.0–52.0)
Hemoglobin: 13.5 g/dL (ref 13.0–17.0)
MCH: 29.7 pg (ref 26.0–34.0)
MCHC: 33.4 g/dL (ref 30.0–36.0)
MCV: 88.8 fL (ref 80.0–100.0)
Platelets: 142 10*3/uL — ABNORMAL LOW (ref 150–400)
RBC: 4.55 MIL/uL (ref 4.22–5.81)
RDW: 13.3 % (ref 11.5–15.5)
WBC: 8.9 10*3/uL (ref 4.0–10.5)
nRBC: 0 % (ref 0.0–0.2)

## 2023-01-04 LAB — COMPREHENSIVE METABOLIC PANEL
ALT: 19 U/L (ref 0–44)
AST: 23 U/L (ref 15–41)
Albumin: 4.4 g/dL (ref 3.5–5.0)
Alkaline Phosphatase: 82 U/L (ref 38–126)
Anion gap: 11 (ref 5–15)
BUN: 23 mg/dL (ref 8–23)
CO2: 26 mmol/L (ref 22–32)
Calcium: 9.3 mg/dL (ref 8.9–10.3)
Chloride: 100 mmol/L (ref 98–111)
Creatinine, Ser: 0.94 mg/dL (ref 0.61–1.24)
GFR, Estimated: >60 mL/min (ref >60)
Glucose, Bld: 125 mg/dL — ABNORMAL HIGH (ref 70–99)
Potassium: 4.2 mmol/L (ref 3.5–5.1)
Sodium: 137 mmol/L (ref 135–145)
Total Bilirubin: 1.2 mg/dL (ref 0.3–1.2)
Total Protein: 9.2 g/dL — ABNORMAL HIGH (ref 6.5–8.1)

## 2023-01-04 LAB — LIPASE, BLOOD: Lipase: 29 U/L (ref 11–51)

## 2023-01-04 MED ORDER — SODIUM CHLORIDE 0.9 % IV BOLUS
1000.0000 mL | Freq: Once | INTRAVENOUS | Status: AC
  Administered 2023-01-04: 1000 mL via INTRAVENOUS

## 2023-01-04 MED ORDER — ONDANSETRON 4 MG PO TBDP: ORAL_TABLET | ORAL | 0 refills | Status: DC

## 2023-01-04 MED ORDER — PANTOPRAZOLE SODIUM 40 MG IV SOLR
40.0000 mg | Freq: Once | INTRAVENOUS | Status: AC
  Administered 2023-01-04: 40 mg via INTRAVENOUS
  Filled 2023-01-04: qty 10

## 2023-01-04 MED ORDER — DICYCLOMINE HCL 20 MG PO TABS: ORAL_TABLET | ORAL | 0 refills | Status: AC

## 2023-01-04 MED ORDER — IOHEXOL 300 MG/ML  SOLN
100.0000 mL | Freq: Once | INTRAMUSCULAR | Status: AC | PRN
  Administered 2023-01-04: 100 mL via INTRAVENOUS

## 2023-01-04 MED ORDER — ONDANSETRON HCL 4 MG/2ML IJ SOLN
4.0000 mg | Freq: Once | INTRAMUSCULAR | Status: AC | PRN
  Administered 2023-01-04: 4 mg via INTRAVENOUS
  Filled 2023-01-04: qty 2

## 2023-01-04 NOTE — ED Provider Notes (Signed)
[place] EMERGENCY DEPARTMENT AT [facility] Provider Note   CSN: [id number] Arrival date & time: 01/04/23  0800     History {Add pertinent medical, surgical, social history, OB history to HPI:1} Chief Complaint  Patient presents with   Emesis    Warren Sullivan is a 62 y.o. male.  Patient complains of nausea and vomiting.  Mild abdominal discomfort.  Patient has a history of an old stroke   Emesis      Home Medications Prior to Admission medications   Medication Sig Start Date End Date Taking? Authorizing Provider  dicyclomine (BENTYL) 20 MG tablet Take one every 8 hours for abd cramps 01/04/23  Yes [name], MD  ondansetron (ZOFRAN-ODT) 4 MG disintegrating tablet 4mg  ODT q4 hours prn nausea/vomit 01/04/23  Yes [name], MD  acetaminophen (TYLENOL) 650 MG CR tablet Take 1,300 mg by mouth every 8 (eight) hours as needed (general discomfort).    [provider]  albuterol (PROVENTIL) (2.5 MG/3ML) 0.083% nebulizer solution Take 3 mLs (2.5 mg total) by nebulization every 4 (four) hours as needed for wheezing or shortness of breath. 01/30/22   Johnson, Clanford L, MD  atorvastatin (LIPITOR) 20 MG tablet Take 1 tablet (20 mg total) by mouth at bedtime. 06/11/22   Glade Lloyd, MD  Calcium Carbonate (CALCIUM 500 PO) Take 1 tablet by mouth daily.    [provider]  Cholecalciferol 25 MCG (1000 UT) capsule Take 5,000 Units by mouth daily.    [provider]  cloNIDine (CATAPRES) 0.1 MG tablet Take 2 tablets (0.2 mg total) by mouth 2 (two) times daily. 08/20/21   Shon Hale, MD  gabapentin (NEURONTIN) 300 MG capsule Take 1 capsule (300 mg total) by mouth 3 (three) times daily. Start with one at bedtime for 1 week. Then two at bedtime for one week, then go to full dose. 10/19/15   Mechele Claude, MD  memantine (NAMENDA) 10 MG tablet Take 10 mg by mouth 2 (two) times daily. 07/24/21   [provider]  metoCLOPramide (REGLAN) 5  MG tablet Take 1 tablet (5 mg total) by mouth 3 (three) times daily. 12/01/22 12/01/23  Vassie Loll, MD  Multiple Vitamin (MULTIVITAMIN) tablet Take 1 tablet by mouth daily.    [provider]  ondansetron (ZOFRAN) 4 MG tablet Take 4 mg by mouth every 8 (eight) hours as needed for nausea or vomiting. 02/26/22   [provider]  pantoprazole (PROTONIX) 40 MG tablet Take 1 tablet (40 mg total) by mouth 2 (two) times daily. 08/20/21 11/25/22  Shon Hale, MD  polyethylene glycol (MIRALAX / GLYCOLAX) 17 g packet Take 17 g by mouth daily. 12/02/22   Vassie Loll, MD  tamsulosin (FLOMAX) 0.4 MG CAPS capsule Take 0.4 mg by mouth daily. 07/11/21   [provider]  VASCEPA 1 g capsule Take 2 g by mouth 2 (two) times daily. 08/08/21   [provider]  Venlafaxine HCl 75 MG TB24 Take 75 mg by mouth daily.    [provider]      Allergies    Patient has no known allergies.    Review of Systems   Review of Systems  Gastrointestinal:  Positive for vomiting.    Physical Exam Updated Vital Signs BP (!) 145/89 (BP Location: Left Arm)   Pulse 77   Temp 98 F (36.7 C) (Oral)   Resp 18   Ht 5\' 11"  (1.803 m)   Wt 107 kg   SpO2 97%  BMI 32.92 kg/m  Physical Exam  ED Results / Procedures / Treatments   Labs (all labs ordered are listed, but only abnormal results are displayed) Labs Reviewed  COMPREHENSIVE METABOLIC PANEL - Abnormal; Notable for the following components:      Result Value   Glucose, Bld 125 (*)    Total Protein 9.2 (*)    All other components within normal limits  CBC - Abnormal; Notable for the following components:   Platelets 142 (*)    All other components within normal limits  LIPASE, BLOOD  URINALYSIS, ROUTINE W REFLEX MICROSCOPIC    EKG None  Radiology CT ABDOMEN PELVIS W CONTRAST  Result Date: 01/04/2023 CLINICAL DATA:  Acute generalized abdominal pain. EXAM: CT ABDOMEN AND PELVIS WITH CONTRAST TECHNIQUE:  Multidetector CT imaging of the abdomen and pelvis was performed using the standard protocol following bolus administration of intravenous contrast. RADIATION DOSE REDUCTION: This exam was performed according to the departmental dose-optimization program which includes automated exposure control, adjustment of the mA and/or kV according to patient size and/or use of iterative reconstruction technique. CONTRAST:  OMNIPAQUE IOHEXOL 300 MG/ML  SOLN COMPARISON:  Nov 27, 2022. FINDINGS: Lower chest: No acute abnormality. Hepatobiliary: No focal liver abnormality is seen. Minimal cholelithiasis is noted. No inflammation is noted. No biliary dilatation is noted. Pancreas: Unremarkable. No pancreatic ductal dilatation or surrounding inflammatory changes. Spleen: Normal in size without focal abnormality. Adrenals/Urinary Tract: Adrenal glands appear normal. Small nonobstructive left renal calculus is noted. Small left renal cyst is noted for which no further follow-up is required. No hydronephrosis or renal obstruction is noted. Urinary bladder is unremarkable. Stomach/Bowel: Moderate size sliding-type hiatal hernia is noted. There is no evidence of bowel obstruction or inflammation. The appendix appears normal. Sigmoid diverticulosis is noted without inflammation. Vascular/Lymphatic: Aortic atherosclerosis. No enlarged abdominal or pelvic lymph nodes. Reproductive: Prostate is unremarkable. Other: No abdominal wall hernia or abnormality. No abdominopelvic ascites. Musculoskeletal: No acute or significant osseous findings. IMPRESSION: Minimal cholelithiasis. Small nonobstructive left renal calculus. Moderate size sliding-type hiatal hernia. Sigmoid diverticulosis without inflammation. Aortic Atherosclerosis (ICD10-I70.0). Electronically Signed   By: Lupita Raider M.D.   On: 01/04/2023 10:34    Procedures Procedures  {Document cardiac monitor, telemetry assessment procedure when appropriate:1}  Medications  Ordered in ED Medications  ondansetron (ZOFRAN) injection 4 mg (4 mg Intravenous Given 01/04/23 0927)  sodium chloride 0.9 % bolus 1,000 mL (0 mLs Intravenous Stopped 01/04/23 1027)  pantoprazole (PROTONIX) injection 40 mg (40 mg Intravenous Given 01/04/23 0927)  iohexol (OMNIPAQUE) 300 MG/ML solution 100 mL (100 mLs Intravenous Contrast Given 01/04/23 1010)    ED Course/ Medical Decision Making/ A&P   {   Click here for ABCD2, HEART and other calculatorsREFRESH Note before signing :1}                          Medical Decision Making Amount and/or Complexity of Data Reviewed Labs: ordered. Radiology: ordered.  Risk Prescription drug management.   Patient with nausea and vomiting that has improved.  Labs and CT scan unremarkable.  Patient will be given Zofran and Bentyl and follow-up with PCP  {Document critical care time when appropriate:1} {Document review of labs and clinical decision tools ie heart score, Chads2Vasc2 etc:1}  {Document your independent review of radiology images, and any outside records:1} {Document your discussion with family members, caretakers, and with consultants:1} {Document social determinants of health affecting pt's care:1} {Document your decision making why  or why not admission, treatments were needed:1} Final Clinical Impression(s) / ED Diagnoses Final diagnoses:  Nausea and vomiting, unspecified vomiting type    Rx / DC Orders ED Discharge Orders          Ordered    ondansetron (ZOFRAN-ODT) 4 MG disintegrating tablet        01/04/23 1326    dicyclomine (BENTYL) 20 MG tablet        01/04/23 1326

## 2023-01-04 NOTE — ED Triage Notes (Signed)
Pt was brought by [facility] and reported patient has been vomiting for about an hour. The facility reported the emesis was dark colored. EMS reported it was green.

## 2023-01-04 NOTE — ED Notes (Signed)
Patient verbalizes understanding of discharge instructions. Opportunity for questioning and answers were provided. Armband removed by staff, pt discharged from ED. Sent back to facility via [facility]

## 2023-01-04 NOTE — ED Notes (Signed)
Patient transported back from CT 

## 2023-01-04 NOTE — Discharge Instructions (Signed)
Follow up with your md next week for recheck °

## 2023-01-11 ENCOUNTER — Encounter: Payer: Self-pay | Admitting: Family Medicine

## 2023-01-11 DIAGNOSIS — K8020 Calculus of gallbladder without cholecystitis without obstruction: Secondary | ICD-10-CM | POA: Insufficient documentation

## 2023-01-11 DIAGNOSIS — N200 Calculus of kidney: Secondary | ICD-10-CM | POA: Insufficient documentation

## 2023-01-14 ENCOUNTER — Other Ambulatory Visit: Payer: Self-pay

## 2023-01-14 ENCOUNTER — Encounter (HOSPITAL_COMMUNITY): Payer: Self-pay

## 2023-01-14 ENCOUNTER — Inpatient Hospital Stay (HOSPITAL_COMMUNITY)
Admission: EM | Admit: 2023-01-14 | Discharge: 2023-01-16 | DRG: 378 | Disposition: A | Discharge status: Skilled Nursing Facility | Payer: Medicare Other | Source: Skilled Nursing Facility | Attending: Internal Medicine | Admitting: Internal Medicine

## 2023-01-14 ENCOUNTER — Emergency Department (HOSPITAL_COMMUNITY): Payer: Medicare Other

## 2023-01-14 DIAGNOSIS — F17210 Nicotine dependence, cigarettes, uncomplicated: Secondary | ICD-10-CM | POA: Diagnosis present

## 2023-01-14 DIAGNOSIS — Z8249 Family history of ischemic heart disease and other diseases of the circulatory system: Secondary | ICD-10-CM

## 2023-01-14 DIAGNOSIS — K2101 Gastro-esophageal reflux disease with esophagitis, with bleeding: Secondary | ICD-10-CM | POA: Diagnosis present

## 2023-01-14 DIAGNOSIS — J449 Chronic obstructive pulmonary disease, unspecified: Secondary | ICD-10-CM | POA: Diagnosis present

## 2023-01-14 DIAGNOSIS — K31811 Angiodysplasia of stomach and duodenum with bleeding: Principal | ICD-10-CM | POA: Diagnosis present

## 2023-01-14 DIAGNOSIS — N400 Enlarged prostate without lower urinary tract symptoms: Secondary | ICD-10-CM | POA: Diagnosis present

## 2023-01-14 DIAGNOSIS — K449 Diaphragmatic hernia without obstruction or gangrene: Secondary | ICD-10-CM | POA: Diagnosis present

## 2023-01-14 DIAGNOSIS — I10 Essential (primary) hypertension: Secondary | ICD-10-CM | POA: Diagnosis present

## 2023-01-14 DIAGNOSIS — I69354 Hemiplegia and hemiparesis following cerebral infarction affecting left non-dominant side: Secondary | ICD-10-CM

## 2023-01-14 DIAGNOSIS — Z79899 Other long term (current) drug therapy: Secondary | ICD-10-CM

## 2023-01-14 DIAGNOSIS — F0390 Unspecified dementia without behavioral disturbance: Secondary | ICD-10-CM | POA: Diagnosis not present

## 2023-01-14 DIAGNOSIS — K920 Hematemesis: Secondary | ICD-10-CM | POA: Diagnosis present

## 2023-01-14 DIAGNOSIS — Z823 Family history of stroke: Secondary | ICD-10-CM

## 2023-01-14 DIAGNOSIS — F02C0 Dementia in other diseases classified elsewhere, severe, without behavioral disturbance, psychotic disturbance, mood disturbance, and anxiety: Secondary | ICD-10-CM | POA: Diagnosis present

## 2023-01-14 DIAGNOSIS — K3189 Other diseases of stomach and duodenum: Secondary | ICD-10-CM | POA: Diagnosis present

## 2023-01-14 DIAGNOSIS — K7460 Unspecified cirrhosis of liver: Secondary | ICD-10-CM | POA: Diagnosis present

## 2023-01-14 DIAGNOSIS — K922 Gastrointestinal hemorrhage, unspecified: Secondary | ICD-10-CM | POA: Diagnosis present

## 2023-01-14 DIAGNOSIS — E785 Hyperlipidemia, unspecified: Secondary | ICD-10-CM | POA: Diagnosis present

## 2023-01-14 DIAGNOSIS — K31819 Angiodysplasia of stomach and duodenum without bleeding: Secondary | ICD-10-CM

## 2023-01-14 DIAGNOSIS — K5900 Constipation, unspecified: Secondary | ICD-10-CM | POA: Diagnosis present

## 2023-01-14 LAB — COMPREHENSIVE METABOLIC PANEL
ALT: 13 U/L (ref 0–44)
AST: 16 U/L (ref 15–41)
Albumin: 4.2 g/dL (ref 3.5–5.0)
Alkaline Phosphatase: 119 U/L (ref 38–126)
Anion gap: 11 (ref 5–15)
BUN: 18 mg/dL (ref 8–23)
CO2: 31 mmol/L (ref 22–32)
Calcium: 9.5 mg/dL (ref 8.9–10.3)
Chloride: 98 mmol/L (ref 98–111)
Creatinine, Ser: 1.08 mg/dL (ref 0.61–1.24)
GFR, Estimated: >60 mL/min (ref >60)
Glucose, Bld: 131 mg/dL — ABNORMAL HIGH (ref 70–99)
Potassium: 4.4 mmol/L (ref 3.5–5.1)
Sodium: 140 mmol/L (ref 135–145)
Total Bilirubin: 0.8 mg/dL (ref 0.3–1.2)
Total Protein: 9 g/dL — ABNORMAL HIGH (ref 6.5–8.1)

## 2023-01-14 LAB — CBC WITH DIFFERENTIAL/PLATELET
Abs Immature Granulocytes: 0.05 10*3/uL (ref 0.00–0.07)
Basophils Absolute: 0 10*3/uL (ref 0.0–0.1)
Basophils Relative: 0 %
Eosinophils Absolute: 0.1 10*3/uL (ref 0.0–0.5)
Eosinophils Relative: 1 %
HCT: 45.5 % (ref 39.0–52.0)
Hemoglobin: 15.2 g/dL (ref 13.0–17.0)
Immature Granulocytes: 1 %
Lymphocytes Relative: 14 %
Lymphs Abs: 1.3 10*3/uL (ref 0.7–4.0)
MCH: 29.4 pg (ref 26.0–34.0)
MCHC: 33.4 g/dL (ref 30.0–36.0)
MCV: 88 fL (ref 80.0–100.0)
Monocytes Absolute: 0.5 10*3/uL (ref 0.1–1.0)
Monocytes Relative: 5 %
Neutro Abs: 7.5 10*3/uL (ref 1.7–7.7)
Neutrophils Relative %: 79 %
Platelets: 228 10*3/uL (ref 150–400)
RBC: 5.17 MIL/uL (ref 4.22–5.81)
RDW: 13.3 % (ref 11.5–15.5)
WBC: 9.5 10*3/uL (ref 4.0–10.5)
nRBC: 0 % (ref 0.0–0.2)

## 2023-01-14 LAB — HEMOGLOBIN AND HEMATOCRIT, BLOOD
HCT: 45 % (ref 39.0–52.0)
Hemoglobin: 15.2 g/dL (ref 13.0–17.0)

## 2023-01-14 LAB — APTT: aPTT: 29 seconds (ref 24–36)

## 2023-01-14 LAB — SAMPLE TO BLOOD BANK

## 2023-01-14 LAB — MAGNESIUM: Magnesium: 1.9 mg/dL (ref 1.7–2.4)

## 2023-01-14 MED ORDER — PANTOPRAZOLE INFUSION (NEW) - SIMPLE MED
8.0000 mg/h | INTRAVENOUS | Status: DC
  Administered 2023-01-14 – 2023-01-15 (×2): 8 mg/h via INTRAVENOUS
  Filled 2023-01-14 (×6): qty 100
  Filled 2023-01-14: qty 80

## 2023-01-14 MED ORDER — PANTOPRAZOLE SODIUM 40 MG IV SOLR
40.0000 mg | INTRAVENOUS | Status: AC
  Administered 2023-01-14: 40 mg via INTRAVENOUS
  Filled 2023-01-14: qty 10

## 2023-01-14 MED ORDER — PANTOPRAZOLE SODIUM 40 MG IV SOLR: 40.0000 mg | Freq: Two times a day (BID) | INTRAVENOUS | Status: DC

## 2023-01-14 MED ORDER — PANTOPRAZOLE SODIUM 40 MG IV SOLR
40.0000 mg | Freq: Once | INTRAVENOUS | Status: AC
  Administered 2023-01-14: 40 mg via INTRAVENOUS
  Filled 2023-01-14: qty 10

## 2023-01-14 MED ORDER — LACTATED RINGERS IV SOLN: INTRAVENOUS | Status: AC

## 2023-01-14 MED ORDER — PROCHLORPERAZINE EDISYLATE 10 MG/2ML IJ SOLN
10.0000 mg | Freq: Once | INTRAMUSCULAR | Status: AC
  Administered 2023-01-14: 10 mg via INTRAVENOUS
  Filled 2023-01-14: qty 2

## 2023-01-14 MED ORDER — SODIUM CHLORIDE 0.9 % IV BOLUS
500.0000 mL | Freq: Once | INTRAVENOUS | Status: AC
  Administered 2023-01-14: 500 mL via INTRAVENOUS

## 2023-01-14 MED ORDER — CHLORHEXIDINE GLUCONATE CLOTH 2 % EX PADS
6.0000 | MEDICATED_PAD | Freq: Every day | CUTANEOUS | Status: DC
  Administered 2023-01-14: 6 via TOPICAL

## 2023-01-14 MED ORDER — ONDANSETRON HCL 4 MG/2ML IJ SOLN
4.0000 mg | Freq: Once | INTRAMUSCULAR | Status: AC
  Administered 2023-01-14: 4 mg via INTRAVENOUS
  Filled 2023-01-14: qty 2

## 2023-01-14 NOTE — ED Triage Notes (Signed)
Pt came from [facility] Has MOST form   Coffee ground vomit started at 0700 today Snf gave Zofran EMS vitals 180/120's  Hx of GI bleed  Pt denies pain

## 2023-01-14 NOTE — H&P (Signed)
History and Physical    Warren Sullivan ZOX:[id number] DOB: [birth year] DOA: 01/14/2023  PCP: Sherol Dade, DO (Inactive)   Patient coming from: [facility]  I have personally briefly reviewed patient's old medical records in [facility]  Chief Complaint: Vomiting blood  HPI: Warren Sullivan is a 62 y.o. male with medical history significant for dementia, hypertension, stroke. Patient was brought to the ED with reports of coffee-ground emesis.  At the time of my evaluation, patient is able to answer simple questions, large amount of coffee-ground colored vomitus on his clothing, bedding, and both sides of the bed on the floor.  He denies abdominal pain.  Denies blood in stools, unable to confirm if he has had melena.  ED Course: Tmax 99.8.  Heart rate 79-114.  Respiratory rate 12 -26.  Blood pressure systolic 110s - 170s.  O2 sats 91 to 97% on room air.  Hemoglobin stable at 15.2.   40 mg IV Protonix, 500 mill bolus given.  Review of Systems: As per HPI all other systems reviewed and negative.  Past Medical History:  Diagnosis Date   AKI (acute kidney injury) (HCC) 08/18/2021   CVA (cerebral vascular accident) (HCC)    Dehydration 08/18/2021   Dementia in other diseases classified elsewhere, severe, without behavioral disturbance, psychotic disturbance, mood disturbance, and anxiety (HCC)    Fall at home, initial encounter 08/29/2021   GERD (gastroesophageal reflux disease)    Hemiplegia, unspecified affecting right dominant side (HCC)    Hyperlipidemia    Hypertension    MDD (major depressive disorder)    Pneumonia 08/28/2021   Small bowel obstruction (HCC) 08/17/2021   Stroke (HCC)    Vitamin D deficiency     Past Surgical History:  Procedure Laterality Date   BIOPSY  08/18/2021   Procedure: BIOPSY;  Surgeon: Dolores Frame, MD;  [facility]: [facility];  Service: Gastroenterology;;  Bonnielee Haff and distal esophagus biopsies    BIOPSY   10/03/2021   Procedure: BIOPSY;  Surgeon: Dolores Frame, MD;  Location: [facility];  Service: Gastroenterology;;   BIOPSY  01/30/2022   Procedure: BIOPSY;  Surgeon: Dolores Frame, MD;  Location: [facility];  Service: Gastroenterology;;   ESOPHAGOGASTRODUODENOSCOPY (EGD) WITH PROPOFOL N/A 08/18/2021   Surgeon: Marguerita Merles, Reuel Boom, MD; severe acute nonspecific esophagitis with ulceration and reactive hyperplasia, peptic duodenitis, acute chronic gastritis with H. pylori, 3 cm hiatal hernia.   ESOPHAGOGASTRODUODENOSCOPY (EGD) WITH PROPOFOL N/A 10/03/2021   Surgeon: Marguerita Merles, Reuel Boom, MD; myocarditis, chronic gastritis with H. pylori, 2 cm hiatal hernia, erythematous duodenopathy.   ESOPHAGOGASTRODUODENOSCOPY (EGD) WITH PROPOFOL N/A 01/30/2022   Procedure: ESOPHAGOGASTRODUODENOSCOPY (EGD) WITH PROPOFOL;  Surgeon: Dolores Frame, MD;  Location: [facility];  Service: Gastroenterology;  Laterality: N/A;   HERNIA REPAIR       reports that he has been smoking cigarettes. He has been smoking an average of .25 packs per day. He does not have any smokeless tobacco history on file. He reports that he does not drink alcohol and does not use drugs.  No Known Allergies  Family History  Problem Relation Age of Onset   Hypertension Mother    Stroke Mother    Hypertension Father    Hypertension Sister     Prior to Admission medications   Medication Sig Start Date End Date Taking? Authorizing Provider  acetaminophen (TYLENOL) 650 MG CR tablet Take 1,300 mg by mouth every 8 (eight) hours as needed (general discomfort).  [provider]  albuterol (PROVENTIL) (2.5 MG/3ML) 0.083% nebulizer solution Take 3 mLs (2.5 mg total) by nebulization every 4 (four) hours as needed for wheezing or shortness of breath. 01/30/22   Johnson, Clanford L, MD  atorvastatin (LIPITOR) 20 MG tablet Take 1 tablet (20 mg total) by mouth at bedtime. 06/11/22   Glade Lloyd, MD  Calcium Carbonate (CALCIUM 500 PO) Take 1 tablet by mouth daily.    [provider]  Cholecalciferol 25 MCG (1000 UT) capsule Take 5,000 Units by mouth daily.    [provider]  cloNIDine (CATAPRES) 0.1 MG tablet Take 2 tablets (0.2 mg total) by mouth 2 (two) times daily. 08/20/21   Shon Hale, MD  dicyclomine (BENTYL) 20 MG tablet Take one every 8 hours for abd cramps 01/04/23   [name], MD  gabapentin (NEURONTIN) 300 MG capsule Take 1 capsule (300 mg total) by mouth 3 (three) times daily. Start with one at bedtime for 1 week. Then two at bedtime for one week, then go to full dose. 10/19/15   Mechele Claude, MD  memantine (NAMENDA) 10 MG tablet Take 10 mg by mouth 2 (two) times daily. 07/24/21   [provider]  metoCLOPramide (REGLAN) 5 MG tablet Take 1 tablet (5 mg total) by mouth 3 (three) times daily. 12/01/22 12/01/23  Vassie Loll, MD  Multiple Vitamin (MULTIVITAMIN) tablet Take 1 tablet by mouth daily.    [provider]  ondansetron (ZOFRAN) 4 MG tablet Take 4 mg by mouth every 8 (eight) hours as needed for nausea or vomiting. 02/26/22   [provider]  ondansetron (ZOFRAN-ODT) 4 MG disintegrating tablet 4mg  ODT q4 hours prn nausea/vomit 01/04/23   [name], MD  pantoprazole (PROTONIX) 40 MG tablet Take 1 tablet (40 mg total) by mouth 2 (two) times daily. 08/20/21 11/25/22  Shon Hale, MD  polyethylene glycol (MIRALAX / GLYCOLAX) 17 g packet Take 17 g by mouth daily. 12/02/22   Vassie Loll, MD  tamsulosin (FLOMAX) 0.4 MG CAPS capsule Take 0.4 mg by mouth daily. 07/11/21   [provider]  VASCEPA 1 g capsule Take 2 g by mouth 2 (two) times daily. 08/08/21   [provider]  Venlafaxine HCl 75 MG TB24 Take 75 mg by mouth daily.    [provider]    Physical Exam: Vitals:   01/14/23 2100 01/14/23 2115 01/14/23 2136 01/14/23 2139  BP: (!) 158/119 (!) 148/106  (!) 156/107  Pulse: (!) 103  96  99  Resp: 12 17  18   Temp:      TempSrc:      SpO2:   92%   Height:        Constitutional: NAD, calm, comfortable Vitals:   01/14/23 2100 01/14/23 2115 01/14/23 2136 01/14/23 2139  BP: (!) 158/119 (!) 148/106  (!) 156/107  Pulse: (!) 103 96  99  Resp: 12 17  18   Temp:      TempSrc:      SpO2:   92%   Height:       Eyes: PERRL, lids normal, both conjunctiva injected, no discharge. ENMT: Mucous membranes are dry, remnants of coffee-ground emesis on mucus membranes Neck: normal, supple, no masses, no thyromegaly Respiratory: clear to auscultation bilaterally, no wheezing, no crackles. Normal respiratory effort. No accessory muscle use.  Cardiovascular: Regular rate and rhythm, no murmurs / rubs / gallops. No extremity edema. 2+ pedal pulses. No carotid bruits.  Abdomen: no tenderness, no masses palpated. No hepatosplenomegaly. Bowel  sounds positive.  Musculoskeletal: no clubbing / cyanosis. No joint deformity upper and lower extremities.  Skin: no rashes, lesions, ulcers. No induration Neurologic: No facial asymmetry, able to answer simple questions, moving extremities spontaneously Psychiatric: Awake and alert oriented to person place and situation.  Labs on Admission: I have personally reviewed following labs and imaging studies  CBC: Recent Labs  Lab 01/14/23 1348  WBC 9.5  NEUTROABS 7.5  HGB 15.2  HCT 45.5  MCV 88.0  PLT 228   Basic Metabolic Panel: Recent Labs  Lab 01/14/23 1348  NA 140  K 4.4  CL 98  CO2 31  GLUCOSE 131*  BUN 18  CREATININE 1.08  CALCIUM 9.5  MG 1.9   GFR: Estimated Creatinine Clearance: 87.1 mL/min (by C-G formula based on SCr of 1.08 mg/dL). Liver Function Tests: Recent Labs  Lab 01/14/23 1348  AST 16  ALT 13  ALKPHOS 119  BILITOT 0.8  PROT 9.0*  ALBUMIN 4.2   Radiological Exams on Admission: DG Chest Port 1 View  Result Date: 01/14/2023 CLINICAL DATA:  Cough and vomiting EXAM: PORTABLE CHEST 1 VIEW COMPARISON:  Chest  radiograph dated 11/27/2022 FINDINGS: Normal lung volumes. No focal consolidations. Left upper quadrant and epigastric lucency. No pleural effusion or pneumothorax. The heart size and mediastinal contours are within normal limits. No acute osseous abnormality. IMPRESSION: 1. No focal consolidations. 2. Left upper quadrant and epigastric lucency, likely the aerated stomach. Electronically Signed   By: Agustin Cree M.D.   On: 01/14/2023 15:21    EKG: Independently reviewed.  Sinus rhythm, rate 89, QTc 453.  Assessment/Plan Principal Problem:   GI bleed Active Problems:   Essential hypertension   Dementia without behavioral disturbance (HCC)   Assessment and Plan: * GI bleed Hematemesis.  Denies hematochezia.  Hemoglobin vital stable at this time.  Hemoglobin 15.2 x 2.  History of liver cirrhosis.  Similar presentation - 01/2022, EGD by Dr. Levon Hedger that was unremarkable. - I talked to Dr. Marletta Lor via secure chat, team will see patient in the morning.  Agreed with IV bolus and drip, n.p.o., trend hemoglobin, IV fluids. -trend Hemoglobin -N.p.o. - bolus given, continue LR at 100cc/hr x 15hrs  Dementia without behavioral disturbance (HCC) Nursing home resident.  On my evaluation awake and alert oriented person place and situation.  MOST form from nursing home states full scope of treatment.  Essential hypertension Stable. -Hold home clonidine 0.1 mg twice daily in the setting of GI bleed   DVT prophylaxis: SCDS Code Status: FULL code- MOST form at bedside states full scope of treatment. Family Communication: None at bedside. Sister was previously present. Disposition Plan: ~ 2 days Consults called: GI Admission status: Inpt Stepdown I certify that at the point of admission it is my clinical judgment that the patient will require inpatient hospital care spanning beyond 2 midnights from the point of admission due to high intensity of service, high risk for further deterioration and high  frequency of surveillance required.    Author: Onnie Boer, MD 01/14/2023 1:02 AM  For on call review [email or web address].

## 2023-01-14 NOTE — ED Notes (Addendum)
Medicated per MAR Pt vomited at least 100cc thick coffee ground emesis.  Cleaned face Informed MD  Will continue to monitor

## 2023-01-14 NOTE — ED Provider Notes (Signed)
[place] [facility] EMERGENCY DEPARTMENT AT [facility] Provider Note   CSN: [id number] Arrival date & time: 01/14/23  1332     History  Chief Complaint  Patient presents with   Emesis    Warren Sullivan is a 62 y.o. male.   Emesis  This patient is a 62 year old male, he is currently in a nursing facility, he has a history of multiple strokes, dementia, is here with his sister who is an additional historian reporting that the patient has had some vomiting today that look like coffee-ground emesis.  There was a report that the patient may have had a prior gastrointestinal bleed in the past.  He states he is still nauseated but has no abdominal pain chest pain or shortness of breath.  He has no swelling of his legs.  He was doing fine until today when he started to have these recurrent episodes of vomiting.  Review of the medical record shows that the patient had an upper endoscopy in July 2023 approximately 1 year ago, this was done because he was having coffee-ground emesis at that time.  This showed that he had a hiatal hernia, a small nodule in the lower third of the esophagus, the stomach was normal, duodenum was normal he was directed to continue pantoprazole 40 mg twice a day at that time.  He had been admitted during that admission, was seen by the GI service, EGD, thought that there may have been a case of aspiration pneumonia secondary to the vomiting and was discharged on oral Augmentin.  No obvious source of upper GI bleeding was found    Home Medications Prior to Admission medications   Medication Sig Start Date End Date Taking? Authorizing Provider  acetaminophen (TYLENOL) 650 MG CR tablet Take 1,300 mg by mouth every 8 (eight) hours as needed (general discomfort).    [provider]  albuterol (PROVENTIL) (2.5 MG/3ML) 0.083% nebulizer solution Take 3 mLs (2.5 mg total) by nebulization every 4 (four) hours as needed for wheezing or shortness of breath. 01/30/22    Johnson, Clanford L, MD  atorvastatin (LIPITOR) 20 MG tablet Take 1 tablet (20 mg total) by mouth at bedtime. 06/11/22   Glade Lloyd, MD  Calcium Carbonate (CALCIUM 500 PO) Take 1 tablet by mouth daily.    [provider]  Cholecalciferol 25 MCG (1000 UT) capsule Take 5,000 Units by mouth daily.    [provider]  cloNIDine (CATAPRES) 0.1 MG tablet Take 2 tablets (0.2 mg total) by mouth 2 (two) times daily. 08/20/21   Shon Hale, MD  dicyclomine (BENTYL) 20 MG tablet Take one every 8 hours for abd cramps 01/04/23   [name], MD  gabapentin (NEURONTIN) 300 MG capsule Take 1 capsule (300 mg total) by mouth 3 (three) times daily. Start with one at bedtime for 1 week. Then two at bedtime for one week, then go to full dose. 10/19/15   Mechele Claude, MD  memantine (NAMENDA) 10 MG tablet Take 10 mg by mouth 2 (two) times daily. 07/24/21   [provider]  metoCLOPramide (REGLAN) 5 MG tablet Take 1 tablet (5 mg total) by mouth 3 (three) times daily. 12/01/22 12/01/23  Vassie Loll, MD  Multiple Vitamin (MULTIVITAMIN) tablet Take 1 tablet by mouth daily.    [provider]  ondansetron (ZOFRAN) 4 MG tablet Take 4 mg by mouth every 8 (eight) hours as needed for nausea or vomiting. 02/26/22   [provider]  ondansetron (ZOFRAN-ODT) 4 MG disintegrating  tablet 4mg  ODT q4 hours prn nausea/vomit 01/04/23   [name], MD  pantoprazole (PROTONIX) 40 MG tablet Take 1 tablet (40 mg total) by mouth 2 (two) times daily. 08/20/21 11/25/22  Shon Hale, MD  polyethylene glycol (MIRALAX / GLYCOLAX) 17 g packet Take 17 g by mouth daily. 12/02/22   Vassie Loll, MD  tamsulosin (FLOMAX) 0.4 MG CAPS capsule Take 0.4 mg by mouth daily. 07/11/21   [provider]  VASCEPA 1 g capsule Take 2 g by mouth 2 (two) times daily. 08/08/21   [provider]  Venlafaxine HCl 75 MG TB24 Take 75 mg by mouth daily.    [provider]      Allergies     Patient has no known allergies.    Review of Systems   Review of Systems  Gastrointestinal:  Positive for vomiting.  All other systems reviewed and are negative.   Physical Exam Updated Vital Signs BP (!) 172/103 (BP Location: Left Arm)   Pulse 97   Temp 98 F (36.7 C) (Oral)   Resp (!) 25   Ht 1.803 m (5\' 11" )   SpO2 93%   BMI 32.92 kg/m  Physical Exam Vitals and nursing note reviewed.  Constitutional:      General: He is not in acute distress.    Appearance: He is well-developed.  HENT:     Head: Normocephalic and atraumatic.     Mouth/Throat:     Pharynx: No oropharyngeal exudate.  Eyes:     General: No scleral icterus.       Right eye: No discharge.        Left eye: No discharge.     Conjunctiva/sclera: Conjunctivae normal.     Pupils: Pupils are equal, round, and reactive to light.  Neck:     Thyroid: No thyromegaly.     Vascular: No JVD.  Cardiovascular:     Rate and Rhythm: Normal rate and regular rhythm.     Heart sounds: Normal heart sounds. No murmur heard.    No friction rub. No gallop.  Pulmonary:     Effort: Pulmonary effort is normal. No respiratory distress.     Breath sounds: Normal breath sounds. No wheezing or rales.  Abdominal:     General: Bowel sounds are normal. There is no distension.     Palpations: Abdomen is soft. There is no mass.     Tenderness: There is no abdominal tenderness.  Musculoskeletal:        General: No tenderness. Normal range of motion.     Cervical back: Normal range of motion and neck supple.     Right lower leg: No edema.     Left lower leg: No edema.  Lymphadenopathy:     Cervical: No cervical adenopathy.  Skin:    General: Skin is warm and dry.     Findings: No erythema or rash.  Neurological:     Mental Status: He is alert.     Coordination: Coordination normal.  Psychiatric:        Behavior: Behavior normal.     ED Results / Procedures / Treatments   Labs (all labs ordered are listed, but only  abnormal results are displayed) Labs Reviewed  COMPREHENSIVE METABOLIC PANEL - Abnormal; Notable for the following components:      Result Value   Glucose, Bld 131 (*)    Total Protein 9.0 (*)    All other components within normal limits  CBC WITH DIFFERENTIAL/PLATELET  APTT  MAGNESIUM  OCCULT BLOOD GASTRIC / DUODENUM (SPECIMEN CUP)  SAMPLE TO BLOOD BANK    EKG EKG Interpretation Date/Time:  Monday January 14 2023 13:49:15 EDT Ventricular Rate:  87 PR Interval:  160 QRS Duration:  82 QT Interval:  376 QTC Calculation: 453 R Axis:   48  Text Interpretation: Sinus rhythm Consider left atrial enlargement Nonspecific T abnormalities, lateral leads Confirmed by Meridee Score [phone]) on 01/14/2023 2:00:50 PM  Radiology DG Chest Port 1 View  Result Date: 01/14/2023 CLINICAL DATA:  Cough and vomiting EXAM: PORTABLE CHEST 1 VIEW COMPARISON:  Chest radiograph dated 11/27/2022 FINDINGS: Normal lung volumes. No focal consolidations. Left upper quadrant and epigastric lucency. No pleural effusion or pneumothorax. The heart size and mediastinal contours are within normal limits. No acute osseous abnormality. IMPRESSION: 1. No focal consolidations. 2. Left upper quadrant and epigastric lucency, likely the aerated stomach. Electronically Signed   By: Agustin Cree M.D.   On: 01/14/2023 15:21    Procedures Procedures    Medications Ordered in ED Medications  pantoprazole (PROTONIX) injection 40 mg (has no administration in time range)  ondansetron (ZOFRAN) injection 4 mg (4 mg Intravenous Given 01/14/23 1641)  sodium chloride 0.9 % bolus 500 mL (0 mLs Intravenous Stopped 01/14/23 1822)  prochlorperazine (COMPAZINE) injection 10 mg (10 mg Intravenous Given 01/14/23 1827)    ED Course/ Medical Decision Making/ A&P                             Medical Decision Making Amount and/or Complexity of Data Reviewed Labs: ordered. Radiology: ordered. ECG/medicine tests: ordered.  Risk Prescription drug  management. Decision regarding hospitalization.    This patient presents to the ED for concern of bleeding or vomiting, this involves an extensive number of treatment options, and is a complaint that carries with it a high risk of complications and morbidity.  The differential diagnosis includes nausea and vomiting, gastrointestinal illness, gastroenteritis, bowel obstruction, his oxygen is 90 to 91% though he does not have any respiratory symptoms tachypnea or abnormal lung findings.  He may have aspirated again.  He did not have a direct source of what was thought to be hematemesis during the last visit.  Will try to Gastroccult if possible   Co morbidities that complicate the patient evaluation  Dementia, nursing home   Additional history obtained:  Additional history obtained from medical record External records from outside source obtained and reviewed including prior electronic medical record and endoscopy notes   Lab Tests:  I Ordered, and personally interpreted labs.  The pertinent results include: Globin and metabolic panel unremarkable   Imaging Studies ordered:  I ordered imaging studies including chest x-ray I independently visualized and interpreted imaging which showed no signs of aspiration I agree with the radiologist interpretation   Cardiac Monitoring: / EKG:  The patient was maintained on a cardiac monitor.  I personally viewed and interpreted the cardiac monitored which showed an underlying rhythm of: Normal sinus rhythm   Consultations Obtained:  I requested consultation with the hospitalist Dr. Mariea Clonts,  and discussed lab and imaging findings as well as pertinent plan - they recommend: Admission to the hospital   Problem List / ED Course / Critical interventions / Medication management  Possible upper GI bleed I ordered medication including Protonix and oxygen for slightly low oxygen and GI bleed Reevaluation of the patient after these medicines  showed that the patient also given Compazine, minimally improved, still  continues to be nauseated and vomit I have reviewed the patients home medicines and have made adjustments as needed   Social Determinants of Health:  Nursing facility, mild dementia   Test / Admission - Considered:  Will add         Final Clinical Impression(s) / ED Diagnoses Final diagnoses:  Hematemesis with nausea    Rx / DC Orders ED Discharge Orders     None         Eber Hong, MD 01/14/23 Barry Brunner

## 2023-01-15 ENCOUNTER — Inpatient Hospital Stay (HOSPITAL_COMMUNITY): Payer: Medicare Other | Admitting: Anesthesiology

## 2023-01-15 ENCOUNTER — Encounter (HOSPITAL_COMMUNITY)
Admission: EM | Disposition: A | Discharge status: Skilled Nursing Facility | Payer: Self-pay | Source: Skilled Nursing Facility | Attending: Internal Medicine

## 2023-01-15 DIAGNOSIS — F0390 Unspecified dementia without behavioral disturbance: Secondary | ICD-10-CM | POA: Diagnosis not present

## 2023-01-15 DIAGNOSIS — R112 Nausea with vomiting, unspecified: Secondary | ICD-10-CM | POA: Diagnosis not present

## 2023-01-15 DIAGNOSIS — Z79899 Other long term (current) drug therapy: Secondary | ICD-10-CM | POA: Diagnosis not present

## 2023-01-15 DIAGNOSIS — K31819 Angiodysplasia of stomach and duodenum without bleeding: Secondary | ICD-10-CM

## 2023-01-15 DIAGNOSIS — K7460 Unspecified cirrhosis of liver: Secondary | ICD-10-CM | POA: Diagnosis present

## 2023-01-15 DIAGNOSIS — F17210 Nicotine dependence, cigarettes, uncomplicated: Secondary | ICD-10-CM

## 2023-01-15 DIAGNOSIS — J449 Chronic obstructive pulmonary disease, unspecified: Secondary | ICD-10-CM | POA: Diagnosis present

## 2023-01-15 DIAGNOSIS — K209 Esophagitis, unspecified without bleeding: Secondary | ICD-10-CM

## 2023-01-15 DIAGNOSIS — K3189 Other diseases of stomach and duodenum: Secondary | ICD-10-CM | POA: Diagnosis not present

## 2023-01-15 DIAGNOSIS — E785 Hyperlipidemia, unspecified: Secondary | ICD-10-CM | POA: Diagnosis present

## 2023-01-15 DIAGNOSIS — I10 Essential (primary) hypertension: Secondary | ICD-10-CM | POA: Diagnosis not present

## 2023-01-15 DIAGNOSIS — K5900 Constipation, unspecified: Secondary | ICD-10-CM | POA: Diagnosis present

## 2023-01-15 DIAGNOSIS — K2101 Gastro-esophageal reflux disease with esophagitis, with bleeding: Secondary | ICD-10-CM | POA: Diagnosis present

## 2023-01-15 DIAGNOSIS — I69354 Hemiplegia and hemiparesis following cerebral infarction affecting left non-dominant side: Secondary | ICD-10-CM | POA: Diagnosis not present

## 2023-01-15 DIAGNOSIS — K31811 Angiodysplasia of stomach and duodenum with bleeding: Secondary | ICD-10-CM | POA: Diagnosis present

## 2023-01-15 DIAGNOSIS — K449 Diaphragmatic hernia without obstruction or gangrene: Secondary | ICD-10-CM

## 2023-01-15 DIAGNOSIS — K920 Hematemesis: Secondary | ICD-10-CM | POA: Diagnosis not present

## 2023-01-15 DIAGNOSIS — N400 Enlarged prostate without lower urinary tract symptoms: Secondary | ICD-10-CM | POA: Diagnosis present

## 2023-01-15 DIAGNOSIS — I7121 Aneurysm of the ascending aorta, without rupture: Secondary | ICD-10-CM | POA: Diagnosis not present

## 2023-01-15 DIAGNOSIS — F02C0 Dementia in other diseases classified elsewhere, severe, without behavioral disturbance, psychotic disturbance, mood disturbance, and anxiety: Secondary | ICD-10-CM | POA: Diagnosis present

## 2023-01-15 DIAGNOSIS — Z8249 Family history of ischemic heart disease and other diseases of the circulatory system: Secondary | ICD-10-CM | POA: Diagnosis not present

## 2023-01-15 DIAGNOSIS — Z823 Family history of stroke: Secondary | ICD-10-CM | POA: Diagnosis not present

## 2023-01-15 DIAGNOSIS — K922 Gastrointestinal hemorrhage, unspecified: Secondary | ICD-10-CM | POA: Diagnosis present

## 2023-01-15 HISTORY — PX: BIOPSY: SHX5522

## 2023-01-15 HISTORY — PX: ESOPHAGOGASTRODUODENOSCOPY (EGD) WITH PROPOFOL: SHX5813

## 2023-01-15 HISTORY — PX: HOT HEMOSTASIS: SHX5433

## 2023-01-15 LAB — CBC
HCT: 41.1 % (ref 39.0–52.0)
HCT: 41.3 % (ref 39.0–52.0)
Hemoglobin: 13.7 g/dL (ref 13.0–17.0)
Hemoglobin: 13.7 g/dL (ref 13.0–17.0)
MCH: 29.7 pg (ref 26.0–34.0)
MCH: 29.4 pg (ref 26.0–34.0)
MCHC: 33.3 g/dL (ref 30.0–36.0)
MCHC: 33.2 g/dL (ref 30.0–36.0)
MCV: 89 fL (ref 80.0–100.0)
MCV: 88.6 fL (ref 80.0–100.0)
Platelets: 209 10*3/uL (ref 150–400)
Platelets: 206 10*3/uL (ref 150–400)
RBC: 4.62 MIL/uL (ref 4.22–5.81)
RBC: 4.66 MIL/uL (ref 4.22–5.81)
RDW: 13.2 % (ref 11.5–15.5)
RDW: 13.5 % (ref 11.5–15.5)
WBC: 10 10*3/uL (ref 4.0–10.5)
WBC: 8.8 10*3/uL (ref 4.0–10.5)
nRBC: 0 % (ref 0.0–0.2)
nRBC: 0 % (ref 0.0–0.2)

## 2023-01-15 LAB — BASIC METABOLIC PANEL
Anion gap: 10 (ref 5–15)
BUN: 23 mg/dL (ref 8–23)
CO2: 30 mmol/L (ref 22–32)
Calcium: 8.6 mg/dL — ABNORMAL LOW (ref 8.9–10.3)
Chloride: 101 mmol/L (ref 98–111)
Creatinine, Ser: 1.33 mg/dL — ABNORMAL HIGH (ref 0.61–1.24)
GFR, Estimated: >60 mL/min (ref >60)
Glucose, Bld: 113 mg/dL — ABNORMAL HIGH (ref 70–99)
Potassium: 3.4 mmol/L — ABNORMAL LOW (ref 3.5–5.1)
Sodium: 141 mmol/L (ref 135–145)

## 2023-01-15 LAB — MRSA NEXT GEN BY PCR, NASAL: MRSA by PCR Next Gen: NOT DETECTED

## 2023-01-15 LAB — HEMOGLOBIN AND HEMATOCRIT, BLOOD
HCT: 42.4 % (ref 39.0–52.0)
Hemoglobin: 13.8 g/dL (ref 13.0–17.0)

## 2023-01-15 SURGERY — ESOPHAGOGASTRODUODENOSCOPY (EGD) WITH PROPOFOL
Anesthesia: General

## 2023-01-15 MED ORDER — ONDANSETRON HCL 4 MG/2ML IJ SOLN: 4.0000 mg | Freq: Four times a day (QID) | INTRAMUSCULAR | Status: DC | PRN

## 2023-01-15 MED ORDER — SUCRALFATE 1 GM/10ML PO SUSP
1.0000 g | Freq: Three times a day (TID) | ORAL | Status: DC
  Administered 2023-01-15 – 2023-01-16 (×4): 1 g via ORAL
  Filled 2023-01-15 (×5): qty 10

## 2023-01-15 MED ORDER — LIDOCAINE HCL (CARDIAC) PF 100 MG/5ML IV SOSY
PREFILLED_SYRINGE | INTRAVENOUS | Status: DC | PRN
  Administered 2023-01-15: 80 mg via INTRAVENOUS

## 2023-01-15 MED ORDER — PROPOFOL 10 MG/ML IV BOLUS
INTRAVENOUS | Status: AC
  Filled 2023-01-15: qty 20

## 2023-01-15 MED ORDER — ORAL CARE MOUTH RINSE: 15.0000 mL | OROMUCOSAL | Status: DC | PRN

## 2023-01-15 MED ORDER — METOCLOPRAMIDE HCL 5 MG/ML IJ SOLN
10.0000 mg | Freq: Once | INTRAMUSCULAR | Status: AC
  Administered 2023-01-15: 10 mg via INTRAVENOUS
  Filled 2023-01-15: qty 2

## 2023-01-15 MED ORDER — LACTATED RINGERS IV SOLN: INTRAVENOUS | Status: DC

## 2023-01-15 MED ORDER — ONDANSETRON HCL 4 MG PO TABS: 4.0000 mg | ORAL_TABLET | Freq: Four times a day (QID) | ORAL | Status: DC | PRN

## 2023-01-15 MED ORDER — ACETAMINOPHEN 325 MG PO TABS: 650.0000 mg | ORAL_TABLET | Freq: Four times a day (QID) | ORAL | Status: DC | PRN

## 2023-01-15 MED ORDER — PROPOFOL 500 MG/50ML IV EMUL
INTRAVENOUS | Status: DC | PRN
  Administered 2023-01-15: 100 ug/kg/min via INTRAVENOUS

## 2023-01-15 MED ORDER — POLYETHYLENE GLYCOL 3350 17 G PO PACK: 17.0000 g | PACK | Freq: Every day | ORAL | Status: DC | PRN

## 2023-01-15 MED ORDER — PROPOFOL 10 MG/ML IV BOLUS
INTRAVENOUS | Status: DC | PRN
  Administered 2023-01-15: 80 mg via INTRAVENOUS

## 2023-01-15 MED ORDER — ACETAMINOPHEN 650 MG RE SUPP: 650.0000 mg | Freq: Four times a day (QID) | RECTAL | Status: DC | PRN

## 2023-01-15 NOTE — Brief Op Note (Signed)
01/15/2023  4:10 PM  PATIENT:  Warren Sullivan  62 y.o. male  PRE-OPERATIVE DIAGNOSIS:  Coffee-ground emesis  POST-OPERATIVE DIAGNOSIS:  esophagitis, hiatal hernia, avm in stomach  PROCEDURE:  Procedure(s): ESOPHAGOGASTRODUODENOSCOPY (EGD) WITH PROPOFOL (N/A) HOT HEMOSTASIS (ARGON PLASMA COAGULATION/BICAP) BIOPSY  SURGEON:  Surgeon(s) and Role:    * Dolores Frame, MD - Primary  Patient underwent EGD under propofol sedation.  Tolerated the procedure adequately.   FINDINGS: - LA Grade C esophagitis with no bleeding.   - 6 cm hiatal hernia.  - Scar in the gastric antrum.  Biopsied.  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  Treated with argon plasma coagulation (APC).  - Subepithelial nodule found in the duodenum with central indentation.  RECOMMENDATIONS - Return patient to hospital ward for ongoing care.  - Advance diet as tolerated.  - Continue present medications. Discharge on omeprazole capsules. Must open capsule and swallow granules. - Sucralfate suspension 1 g every 6 hours - Referral for outpatient EUS. - If recurrent symptoms, may consider outpatient surgical evaluation for hernia repair. - Await pathology results.   Katrinka Blazing, MD Gastroenterology and Hepatology [facility] Gastroenterology

## 2023-01-15 NOTE — Assessment & Plan Note (Addendum)
Nursing home resident.  On my evaluation awake and alert oriented person place and situation.  MOST form from nursing home states full scope of treatment.

## 2023-01-15 NOTE — Anesthesia Postprocedure Evaluation (Signed)
Anesthesia Post Note  Patient: Warren Sullivan  Procedure(s) Performed: ESOPHAGOGASTRODUODENOSCOPY (EGD) WITH PROPOFOL HOT HEMOSTASIS (ARGON PLASMA COAGULATION/BICAP) BIOPSY  Patient location during evaluation: PACU Anesthesia Type: General Level of consciousness: awake and alert and oriented Pain management: pain level controlled Vital Signs Assessment: post-procedure vital signs reviewed and stable Respiratory status: spontaneous breathing, nonlabored ventilation and respiratory function stable Cardiovascular status: blood pressure returned to baseline and stable Postop Assessment: no apparent nausea or vomiting Anesthetic complications: no  No notable events documented.   Last Vitals:  Vitals:   01/15/23 1554 01/15/23 1600  BP: 110/80 128/87  Pulse: 88 83  Resp: 20 (!) 22  Temp: 36.8 C   SpO2: 94% 94%    Last Pain:  Vitals:   01/15/23 1600  TempSrc:   PainSc: Asleep                 Kenidee Cregan C Lisel Siegrist

## 2023-01-15 NOTE — Assessment & Plan Note (Signed)
Stable. -Hold home clonidine 0.1 mg twice daily in the setting of GI bleed

## 2023-01-15 NOTE — Progress Notes (Signed)
Assessed pt.'s need for O2 due to O2 saturations and pt. Per request of GI PA, since pt. Was Not previously on O2. Upon entering room, nasal cannula was under pt.'s chin and not in the nose. O2 sats 94-95%. Turned O2 off, noticed that pt. Periodically holds his breath, O2 sat dropped to 87-88%. Then pt. Breathed normal and O2 sats went back up to 91-92%. Nurse stayed in pt.'s room for several minutes to monitor trend of breathing pattern of holding breath, desattting, then breathing normal and O2 sats increase. Pt. Currently at 94% on room air.

## 2023-01-15 NOTE — TOC Initial Note (Signed)
Transition of Care [facility]) - Initial/Assessment Note    Patient Details  Name: Warren Sullivan MRN: [id number] Date of Birth: 25-Sep-1960  Transition of Care [facility]) CM/SW Contact:    Villa Herb, LCSWA Phone Number: 01/15/2023, 11:22 AM  Clinical Narrative:                 Pt is long term resident at [facility]. CSW spoke to [place] in admissions who states pt will be able to return at D/C. TOC to follow.   Expected Discharge Plan: Long Term Nursing Home Barriers to Discharge: Continued Medical Work up   Patient Goals and CMS Choice Patient states their goals for this hospitalization and ongoing recovery are:: return to LTC CMS Medicare.gov Compare Post Acute Care list provided to:: Patient Choice offered to / list presented to : Patient      Expected Discharge Plan and Services In-house Referral: Clinical Social Work Discharge Planning Services: CM Consult Post Acute Care Choice: Nursing Home Living arrangements for the past 2 months: Skilled Nursing Facility                                      Prior Living Arrangements/Services Living arrangements for the past 2 months: Skilled Nursing Facility Lives with:: Facility Resident Patient language and need for interpreter reviewed:: Yes Do you feel safe going back to the place where you live?: Yes      Need for Family Participation in Patient Care: Yes (Comment) Care giver support system in place?: Yes (comment)   Criminal Activity/Legal Involvement Pertinent to Current Situation/Hospitalization: No - Comment as needed  Activities of Daily Living Home Assistive Devices/Equipment: Walker (specify type), CBG Meter, Oxygen ADL Screening (condition at time of admission) Patient's cognitive ability adequate to safely complete daily activities?: No Is the patient deaf or have difficulty hearing?: No Does the patient have difficulty seeing, even when wearing glasses/contacts?: No Does the patient have difficulty  concentrating, remembering, or making decisions?: No Patient able to express need for assistance with ADLs?: No Does the patient have difficulty dressing or bathing?: Yes Independently performs ADLs?: No Does the patient have difficulty walking or climbing stairs?: No Weakness of Legs: Both Weakness of Arms/Hands: None  Permission Sought/Granted                  Emotional Assessment         Alcohol / Substance Use: Not Applicable Psych Involvement: No (comment)  Admission diagnosis:  GI bleed [K92.2] Hematemesis with nausea [K92.0] Patient Active Problem List   Diagnosis Date Noted   Left nephrolithiasis, NON-OBSTRUCTING 01/11/2023   Cholelithiasis without cholecystitis 01/11/2023   Coffee ground emesis 11/29/2022   Acute respiratory failure with hypoxia and hypercarbia (HCC) 11/25/2022   Sepsis due to undetermined organism (HCC) 11/25/2022   Lobar pneumonia (HCC) 11/25/2022   Palliative care encounter 09/03/2022   COVID-19 virus infection 07/08/2022   Aneurysm of ascending aorta without rupture (HCC) 06/22/2022   Esophagitis 06/09/2022   Hiatal hernia 06/09/2022   Abnormal CT of the chest 06/09/2022   GI bleed 06/09/2022   Hx of completed stroke 06/08/2022   Hemiplegia, unspecified affecting right dominant side (HCC) 06/08/2022   Emesis, persistent 01/29/2022   Helicobacter pylori duodenitis 11/06/2021   Hypokalemia 08/18/2021   Hyponatremia 08/18/2021   Hypocalcemia 08/18/2021   Hyperglycemia 08/18/2021   Vomiting 08/18/2021   GERD (gastroesophageal reflux disease) 08/18/2021  Hyperlipidemia, unspecified 08/18/2021   Dementia without behavioral disturbance (HCC) 08/18/2021   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 [place] [address] Phone: [phone] Fax: [phone]     Social Determinants of Health (SDOH) Social History: SDOH Screenings   Food Insecurity: No Food Insecurity (01/14/2023)  Housing: Patient Declined (01/14/2023)  Transportation Needs: No Transportation Needs (01/14/2023)  Utilities: Not At Risk (01/14/2023)  Tobacco Use: High Risk (01/14/2023)   SDOH Interventions:     Readmission Risk Interventions     No data to display

## 2023-01-15 NOTE — Consult Note (Signed)
@LOGO @   Referring Provider: [facility] hospitalist Primary Care Physician:  Sherol Dade, DO (Inactive) Primary Gastroenterologist:  Dr. Levon Hedger  Date of Admission: 01/15/23 Date of Consultation: 01/15/23  Reason for Consultation:  Coffee ground emesis  HPI:  Warren Sullivan is a 62 y.o. year old male with a history of COPD, CVA with left hemiparesis, dementia, hypertension, hyperlipidemia, BPH, GERD, ulcerative esophagitis, H. pylori gastritis treated with bismuth quadruple therapy followed by levofloxacin salvage therapy with eradication on EGD in July 2023, duodenitis,  recent admission in May 2024 with pneumonia and developed episode of coffee ground emesis with recommendations for outpatient EGD as Hgb remained stable and was able to advance his diet. Unfortunately he missed his follow-up. He returned to the ER yesterday with episode of coffee-ground emesis.   ED course: Hypertensive, intermittent tachycardia.  SpO2 91% and placed on O2.  Chest x-ray with no acute abnormalities. Hemoglobin 15.2, BUN within normal limits Received Compazine and Zofran.   Started on PPI infusion, IV fluids, and admitted to the hospital service.  GI consulted for further evaluation.  Today:  Poor historian due to underlying dementia. Reports he is here because, "They said I was throwing up blood but I wasn't". Denies Nausea, vomiting, coffee ground emesis, brbpr, melena, abdominal pain, heartburn, dysphagia.  Reports bowels are moving well.  Spoke with patient's sister, Warren Sullivan, who states patient seems to develop intermittent episodes of nausea and vomiting with coffee-ground emesis that occur at random.  Between episodes, he is completely fine.  He eats well and does not complain of any abdominal pain.  When she visited him over the weekend, he was doing very well.  Received a call yesterday that he had recurrent coffee-ground emesis.  Also states he was having hallucinations yesterday, talking to  people who have passed 30 years ago.  She is asking to have his urine checked. She is agreeable for patient to have EGD. Discussed the fact that patient is currently wearing O2. Sister states that he doesn't wear O2 at nursing facility though she isn't sure if his O2 is getting checked. She says every time he comes toi the ER, he gets placed on O2.   Tried reaching [facility], but no one answered.   Past Medical History:  Diagnosis Date   AKI (acute kidney injury) (HCC) 08/18/2021   CVA (cerebral vascular accident) (HCC)    Dehydration 08/18/2021   Dementia in other diseases classified elsewhere, severe, without behavioral disturbance, psychotic disturbance, mood disturbance, and anxiety (HCC)    Fall at home, initial encounter 08/29/2021   GERD (gastroesophageal reflux disease)    Hemiplegia, unspecified affecting right dominant side (HCC)    Hyperlipidemia    Hypertension    MDD (major depressive disorder)    Pneumonia 08/28/2021   Small bowel obstruction (HCC) 08/17/2021   Stroke (HCC)    Vitamin D deficiency     Past Surgical History:  Procedure Laterality Date   BIOPSY  08/18/2021   Procedure: BIOPSY;  Surgeon: Dolores Frame, MD;  [facility];  Service: Gastroenterology;;  Bonnielee Haff and distal esophagus biopsies    BIOPSY  10/03/2021   Procedure: BIOPSY;  Surgeon: Dolores Frame, MD;  [facility];  Service: Gastroenterology;;   BIOPSY  01/30/2022   Procedure: BIOPSY;  Surgeon: Dolores Frame, MD;  [facility];  Service: Gastroenterology;;   ESOPHAGOGASTRODUODENOSCOPY (EGD) WITH PROPOFOL N/A 08/18/2021   Surgeon: Marguerita Merles, Reuel Boom, MD; severe acute nonspecific esophagitis with  ulceration and reactive hyperplasia, peptic duodenitis, acute chronic gastritis with H. pylori, 3 cm hiatal hernia.   ESOPHAGOGASTRODUODENOSCOPY (EGD) WITH PROPOFOL N/A 10/03/2021   Surgeon: Marguerita Merles, Reuel Boom,  MD; myocarditis, chronic gastritis with H. pylori, 2 cm hiatal hernia, erythematous duodenopathy.   ESOPHAGOGASTRODUODENOSCOPY (EGD) WITH PROPOFOL N/A 01/30/2022   Procedure: ESOPHAGOGASTRODUODENOSCOPY (EGD) WITH PROPOFOL;  Surgeon: Dolores Frame, MD;  [facility];  Service: Gastroenterology;  Laterality: N/A;   HERNIA REPAIR      Prior to Admission medications   Medication Sig Start Date End Date Taking? Authorizing Provider  acetaminophen (TYLENOL) 650 MG CR tablet Take 1,300 mg by mouth every 8 (eight) hours as needed (general discomfort).    [provider]  albuterol (PROVENTIL) (2.5 MG/3ML) 0.083% nebulizer solution Take 3 mLs (2.5 mg total) by nebulization every 4 (four) hours as needed for wheezing or shortness of breath. 01/30/22   Johnson, Clanford L, MD  atorvastatin (LIPITOR) 20 MG tablet Take 1 tablet (20 mg total) by mouth at bedtime. 06/11/22   Glade Lloyd, MD  Calcium Carbonate (CALCIUM 500 PO) Take 1 tablet by mouth daily.    [provider]  Cholecalciferol 25 MCG (1000 UT) capsule Take 5,000 Units by mouth daily.    [provider]  cloNIDine (CATAPRES) 0.1 MG tablet Take 2 tablets (0.2 mg total) by mouth 2 (two) times daily. 08/20/21   Shon Hale, MD  dicyclomine (BENTYL) 20 MG tablet Take one every 8 hours for abd cramps 01/04/23   [name], MD  gabapentin (NEURONTIN) 300 MG capsule Take 1 capsule (300 mg total) by mouth 3 (three) times daily. Start with one at bedtime for 1 week. Then two at bedtime for one week, then go to full dose. 10/19/15   Mechele Claude, MD  memantine (NAMENDA) 10 MG tablet Take 10 mg by mouth 2 (two) times daily. 07/24/21   [provider]  metoCLOPramide (REGLAN) 5 MG tablet Take 1 tablet (5 mg total) by mouth 3 (three) times daily. 12/01/22 12/01/23  Vassie Loll, MD  Multiple Vitamin (MULTIVITAMIN) tablet Take 1 tablet by mouth daily.    [provider]  ondansetron  (ZOFRAN) 4 MG tablet Take 4 mg by mouth every 8 (eight) hours as needed for nausea or vomiting. 02/26/22   [provider]  ondansetron (ZOFRAN-ODT) 4 MG disintegrating tablet 4mg  ODT q4 hours prn nausea/vomit 01/04/23   [name], MD  pantoprazole (PROTONIX) 40 MG tablet Take 1 tablet (40 mg total) by mouth 2 (two) times daily. 08/20/21 11/25/22  Shon Hale, MD  polyethylene glycol (MIRALAX / GLYCOLAX) 17 g packet Take 17 g by mouth daily. 12/02/22   Vassie Loll, MD  tamsulosin (FLOMAX) 0.4 MG CAPS capsule Take 0.4 mg by mouth daily. 07/11/21   [provider]  VASCEPA 1 g capsule Take 2 g by mouth 2 (two) times daily. 08/08/21   [provider]  Venlafaxine HCl 75 MG TB24 Take 75 mg by mouth daily.    [provider]    Current Facility-Administered Medications  Medication Dose Route Frequency Provider Last Rate Last Admin   acetaminophen (TYLENOL) tablet 650 mg  650 mg Oral Q6H PRN Emokpae, Ejiroghene E, MD       Or   acetaminophen (TYLENOL) suppository 650 mg  650 mg Rectal Q6H PRN Emokpae, Ejiroghene E, MD       Chlorhexidine Gluconate Cloth 2 % PADS 6 each  6 each Topical Q0600 Emokpae, Heloise Beecham, MD  6 each at 01/14/23 2250   lactated ringers infusion   Intravenous Continuous Emokpae, Ejiroghene E, MD 100 mL/hr at 01/15/23 0354 Infusion Verify at 01/15/23 0354   ondansetron (ZOFRAN) tablet 4 mg  4 mg Oral Q6H PRN Emokpae, Ejiroghene E, MD       Or   ondansetron (ZOFRAN) injection 4 mg  4 mg Intravenous Q6H PRN Emokpae, Ejiroghene E, MD       Oral care mouth rinse  15 mL Mouth Rinse PRN Emokpae, Ejiroghene E, MD       [START ON 01/18/2023] pantoprazole (PROTONIX) injection 40 mg  40 mg Intravenous Q12H Emokpae, Ejiroghene E, MD       pantoprozole (PROTONIX) 80 mg /NS 100 mL infusion  8 mg/hr Intravenous Continuous Emokpae, Ejiroghene E, MD 10 mL/hr at 01/15/23 0354 8 mg/hr at 01/15/23 0354   polyethylene glycol (MIRALAX / GLYCOLAX) packet 17 g   17 g Oral Daily PRN Emokpae, Ejiroghene E, MD        Allergies as of 01/14/2023   (No Known Allergies)    Family History  Problem Relation Age of Onset   Hypertension Mother    Stroke Mother    Hypertension Father    Hypertension Sister     Social History   Socioeconomic History   Marital status: Divorced    Spouse name: Not on file   Number of children: Not on file   Years of education: Not on file   Highest education level: Not on file  Occupational History   Not on file  Tobacco Use   Smoking status: Every Day    Packs/day: .25    Types: Cigarettes   Smokeless tobacco: Not on file  Vaping Use   Vaping Use: Never used  Substance and Sexual Activity   Alcohol use: No   Drug use: No   Sexual activity: Not on file  Other Topics Concern   Not on file  Social History Narrative   Not on file   Social Determinants of Health   Financial Resource Strain: Not on file  Food Insecurity: No Food Insecurity (01/14/2023)   Hunger Vital Sign    Worried About Running Out of Food in the Last Year: Never true    Ran Out of Food in the Last Year: Never true  Transportation Needs: No Transportation Needs (01/14/2023)   PRAPARE - Administrator, Civil Service (Medical): No    Lack of Transportation (Non-Medical): No  Physical Activity: Not on file  Stress: Not on file  Social Connections: Not on file  Intimate Partner Violence: Not At Risk (01/14/2023)   Humiliation, Afraid, Rape, and Kick questionnaire    Fear of Current or Ex-Partner: No    Emotionally Abused: No    Physically Abused: No    Sexually Abused: No    Review of Systems: Reliability limited due to dementia.  Gen: Denies fever, chills, cold or flulike symptoms, presyncope and syncope. CV: Denies chest pain, heart palpitations. Resp: Denies shortness of breath, cough. GI: See HPI Heme: See HPI   Physical Exam: Vital signs in last 24 hours: Temp:  [98 F (36.7 C)-99.4 F (37.4 C)] 99.4 F (37.4  C) (07/09 0511) Pulse Rate:  [79-114] 94 (07/09 0500) Resp:  [12-28] 17 (07/09 0500) BP: (113-181)/(75-131) 125/76 (07/09 0500) SpO2:  [90 %-97 %] 95 % (07/09 0500) Weight:  [94.4 kg-96.4 kg] 96.4 kg (07/09 0511) Last BM Date : 01/14/23 General:   Alert,  Well-developed, well-nourished, pleasant  and cooperative in NAD Head:  Normocephalic and atraumatic. Eyes:  Sclera clear, no icterus.   Conjunctiva pink. Ears:  Normal auditory acuity. Lungs:  Clear throughout to auscultation.   No wheezes, crackles, or rhonchi. No acute distress.Currently on 5L O2, but nasal canula is out of his nose sitting on his upper lip and O2 saturation is 92-95%.  Heart:  Regular rate and rhythm; no murmurs, clicks, rubs,  or gallops. Abdomen:  Soft, and nondistended. Mild epigastric TTP. No masses, hepatosplenomegaly or hernias noted. Normal bowel sounds, without guarding, and without rebound.   Rectal:  Deferred Msk:  Symmetrical without gross deformities. Normal posture. Extremities:  Without edema.  Neurologic:  Alert and  oriented to person, place, and situation.  Skin:  Intact without significant lesions or rashes. Psych: Normal mood and affect.  Intake/Output from previous day: 07/08 0701 - 07/09 0700 In: 539.8 [I.V.:539.8] Out: -  Intake/Output this shift: No intake/output data recorded.  Lab Results: Recent Labs    01/14/23 1348 01/14/23 2207 01/15/23 0449  WBC 9.5  --  10.0  HGB 15.2 15.2 13.7  HCT 45.5 45.0 41.1  PLT 228  --  209   BMET Recent Labs    01/14/23 1348 01/15/23 0449  NA 140 141  K 4.4 3.4*  CL 98 101  CO2 31 30  GLUCOSE 131* 113*  BUN 18 23  CREATININE 1.08 1.33*  CALCIUM 9.5 8.6*   LFT Recent Labs    01/14/23 1348  PROT 9.0*  ALBUMIN 4.2  AST 16  ALT 13  ALKPHOS 119  BILITOT 0.8    Studies/Results: DG Chest Port 1 View  Result Date: 01/14/2023 CLINICAL DATA:  Cough and vomiting EXAM: PORTABLE CHEST 1 VIEW COMPARISON:  Chest radiograph dated  11/27/2022 FINDINGS: Normal lung volumes. No focal consolidations. Left upper quadrant and epigastric lucency. No pleural effusion or pneumothorax. The heart size and mediastinal contours are within normal limits. No acute osseous abnormality. IMPRESSION: 1. No focal consolidations. 2. Left upper quadrant and epigastric lucency, likely the aerated stomach. Electronically Signed   By: Agustin Cree M.D.   On: 01/14/2023 15:21    Impression:  62 y.o. year old male with a history of COPD, CVA with left hemiparesis, dementia, hypertension, hyperlipidemia, BPH, GERD, admission February 2023 with coffee-ground emesis, found to have ulcerative esophagitis, H. pylori gastritis, duodenitis.  Documented H. pylori eradication on repeat EGD in July 2023, recent admission May 2024 with pneumonia and developed coffee-ground emesis with recommendations for outpatient EGD as hemoglobin remained stable and he was able to advance his diet.  Unfortunately, he missed his follow-up and return to the emergency room yesterday with recurrent coffee-ground emesis.  GI consulted for further evaluation.  Coffee-ground emesis: Recurrent.  Etiology is unclear.  Patient is a poor historian and denies all GI symptoms.  Per family, he does very well between these episodes of vomiting with coffee-ground emesis, eating a normal diet, does not complain of any abdominal pain.  Seems that these episodes come on randomly.  No recurrent symptoms since admitted to the hospital.  Hemoglobin has remained within normal limits.  BUN also normal.  Query GERD flares contributing to nausea/vomiting versus underlying gastroparesis.  Coffee-ground emesis may be secondary to gastritis, duodenitis, esophagitis, Mallory-Weiss tear.  Less likely PUD as he has been on PPI twice daily outpatient.  Due to recurrent coffee-ground emesis, will plan for repeat EGD for further evaluation.  Will also give IV Reglan x 1 prior to EGD.  May need to consider gastric  emptying study outpatient to further evaluate recurrent vomiting episodes.    Of note, patient was on 5 L O2 via nasal cannula when I saw him today the nasal cannula was out of his nose resting on his upper lip and O2 saturation remained between 92-95% while I was in the room.  Discussed with nursing staff.  Unclear why he is on O2 supplementation.  Chest x-ray yesterday looked okay.  Nursing staff is going to try to wean his oxygen requirements down.  They note that patient occasionally holds his breath and O2 saturation declines into the upper 80s, but when he breathes normally, O2 saturations in the 90s.  Per sister, patient does not wear O2 outpatient.   ?Hallucinations:  Patients sister states patient was talking to people yesterday that had passed away 30 years ago.  He does have underlying dementia, but this is unusual for him.  She is asking for urine to be checked.  I will also notify hospitalist.   Plan: IV Reglan 10 mg x 1 NPO Proceed with upper endoscopy with propofol by Dr. Levon Hedger today. The risks, benefits, and alternatives have been discussed with the patient in detail. Spoke with patient and sister, Warren Sullivan, who are agreeable to proceed.  Continue PPI infusion for now.  Further recommendations pending EGD.  May need to consider GES outpatient.  UA with reflex to microscopy.    LOS: 0 days    01/15/2023, 7:44 AM   Ermalinda Memos, PA-C [facility]

## 2023-01-15 NOTE — Op Note (Signed)
[facility] Patient Name: Warren Sullivan Procedure Date: 01/15/2023 3:03 PM MRN: [id number] Date of Birth: Nov 08, 1960 Attending MD: Katrinka Blazing , , [id number] CSN: [id number] Age: 62 Admit Type: Outpatient Procedure:                Upper GI endoscopy Indications:              Coffee-ground emesis Providers:                Katrinka Blazing, Nena Polio, RN, Lennice Sites                            Technician, Technician Referring MD:              Medicines:                Monitored Anesthesia Care Complications:            No immediate complications. Estimated Blood Loss:     Estimated blood loss: none. Procedure:                Pre-Anesthesia Assessment:                           - Prior to the procedure, a History and Physical                            was performed, and patient medications, allergies                            and sensitivities were reviewed. The patient's                            tolerance of previous anesthesia was reviewed.                           - The risks and benefits of the procedure and the                            sedation options and risks were discussed with the                            patient. All questions were answered and informed                            consent was obtained.                           - ASA Grade Assessment: III - A patient with severe                            systemic disease.                           After obtaining informed consent, the endoscope was                            passed under direct vision. Throughout the  procedure, the patient's blood pressure, pulse, and                            oxygen saturations were monitored continuously. The                            [id number]-H190 ([id number]) scope was introduced through the                            mouth, and advanced to the second part of duodenum.                            The upper GI endoscopy was accomplished without                             difficulty. The patient tolerated the procedure                            well. Scope In: 3:31:15 PM Scope Out: 3:47:56 PM Total Procedure Duration: 0 hours 16 minutes 41 seconds  Findings:      LA Grade C (one or more mucosal breaks continuous between tops of 2 or       more mucosal folds, less than 75% circumference) esophagitis with no       bleeding was found in the lower third of the esophagus. There was       significant associated edema.      A 6 cm hiatal hernia was found. The hiatal narrowing was 46 cm from the       incisors. The Z-line was 36 cm from the incisors.      A 3 to 6 mm scar was found in the gastric antrum. The scar tissue was       healthy in appearance. Biopsies were taken with a cold forceps for       Helicobacter pylori testing.      A single 4 mm angiodysplastic lesion with no bleeding was found in the       gastric body. Coagulation for bleeding prevention using argon plasma at       0.3 liters/minute and 20 watts was successful.      A single 10 mm subepithelial nodule was found in the second portion of       the duodenum. The nodule was Paris classification Is (protruding,       sessile). This nodule was easily displaced with the tip of the forceps.       There was a central indentation. ?GIST Impression:               - LA Grade C esophagitis with no bleeding.                           - 6 cm hiatal hernia.                           - Scar in the gastric antrum. Biopsied.                           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 Treated with argon plasma coagulation                            (APC).                           - Subepithelial nodule found in the duodenum with                            central indentation. Moderate Sedation:      Per Anesthesia Care Recommendation:           - Return patient to hospital ward for ongoing care.                           -  Advance diet as tolerated.                           - Continue present medications. Discharge on                            omeprazole capsules. Must open capsule and swallow                            granules.                           - Sucralfate suspension 1 g every 6 hours                           - Referral for outpatient EUS.                           - If recurrent symptoms, may consider outpatient                            surgical evaluation for hernia repair.                           - Await pathology results. Procedure Code(s):        --- Professional ---                           43255, 59, Esophagogastroduodenoscopy, flexible,                            transoral; with control of bleeding, any method                           43239, Esophagogastroduodenoscopy, flexible,                            transoral; with biopsy, single or multiple Diagnosis Code(s):        --- Professional ---                           K20.90, Esophagitis, unspecified without bleeding  K44.9, Diaphragmatic hernia without obstruction or                            gangrene                           K31.89, Other diseases of stomach and duodenum                           K31.819, Angiodysplasia of stomach and duodenum                            without bleeding                           K92.0, Hematemesis CPT copyright 2022 American Medical Association. All rights reserved. The codes documented in this report are preliminary and upon coder review may  be revised to meet current compliance requirements. Katrinka Blazing, MD Katrinka Blazing,  01/15/2023 4:29:51 PM This report has been signed electronically. Number of Addenda: 0

## 2023-01-15 NOTE — Anesthesia Preprocedure Evaluation (Signed)
Anesthesia Evaluation  Patient identified by MRN, date of birth, ID band Patient awake    Reviewed: Allergy & Precautions, H&P , NPO status , Patient's Chart, lab work & pertinent test results  Airway Mallampati: II  TM Distance: >3 FB Neck ROM: Full    Dental  (+) Edentulous Upper, Edentulous Lower   Pulmonary shortness of breath, pneumonia, Current Smoker   Pulmonary exam normal breath sounds clear to auscultation       Cardiovascular hypertension, Pt. on medications Normal cardiovascular exam Rhythm:Regular Rate:Normal  1. Left ventricular ejection fraction, by estimation, is >75%. The left  ventricle has hyperdynamic function. The left ventricle has no regional  wall motion abnormalities. Left ventricular diastolic parameters are  consistent with Grade I diastolic  dysfunction (impaired relaxation).   2. RV not well visualized. Grossly appears normal in size and function.  Indeterminate PASP, IVC poorly visualized. . Right ventricular systolic  function was not well visualized. The right ventricular size is not well  visualized.   3. The mitral valve was not well visualized. No evidence of mitral valve  regurgitation. No evidence of mitral stenosis.   4. The tricuspid valve is abnormal.   5. The aortic valve was not well visualized. Aortic valve regurgitation  is not visualized. No aortic stenosis is present.   6. Limited visualization of the ascending aortic aneurysm, consider  additional imaging with CTA or MRA. Marland Kitchen Aortic dilatation noted. There is  mild dilatation of the aortic root, measuring 42 mm. 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     Neuro/Psych  PSYCHIATRIC DISORDERS  Depression   Dementia  Neuromuscular disease CVA    GI/Hepatic Neg liver ROS, hiatal hernia,GERD  Medicated and Poorly Controlled,,  Endo/Other  negative endocrine ROS    Renal/GU Renal disease   negative genitourinary   Musculoskeletal negative musculoskeletal ROS (+)    Abdominal   Peds negative pediatric ROS (+)  Hematology negative hematology ROS (+)   Anesthesia Other Findings   Reproductive/Obstetrics negative OB ROS                             Anesthesia Physical Anesthesia Plan  ASA: 4  Anesthesia Plan: General   Post-op Pain Management: Minimal or no pain anticipated   Induction: Intravenous  PONV Risk Score and Plan: 1 and Propofol infusion  Airway Management Planned: Natural Airway and Nasal Cannula  Additional Equipment:   Intra-op Plan:   Post-operative Plan:   Informed Consent: I have reviewed the patients History and Physical, chart, labs and discussed the procedure including the risks, benefits and alternatives for the proposed anesthesia with the patient or authorized representative who has indicated his/her understanding and acceptance.     Dental advisory given  Plan Discussed with: CRNA and Surgeon  Anesthesia Plan Comments:        Anesthesia Quick Evaluation

## 2023-01-15 NOTE — Transfer of Care (Signed)
Immediate Anesthesia Transfer of Care Note  Patient: Warren Sullivan  Procedure(s) Performed: ESOPHAGOGASTRODUODENOSCOPY (EGD) WITH PROPOFOL HOT HEMOSTASIS (ARGON PLASMA COAGULATION/BICAP) BIOPSY  Patient Location: PACU  Anesthesia Type:General  Level of Consciousness: drowsy and patient cooperative  Airway & Oxygen Therapy: Patient Spontanous Breathing  Post-op Assessment: Report given to RN and Post -op Vital signs reviewed and stable  Post vital signs: Reviewed and stable  Last Vitals:  Vitals Value Taken Time  BP 110/80 01/15/23 1554  Temp 36.8 C 01/15/23 1554  Pulse 88 01/15/23 1555  Resp 20 01/15/23 1555  SpO2 94 % 01/15/23 1555  Vitals shown include unvalidated device data.  Last Pain:  Vitals:   01/15/23 1523  TempSrc:   PainSc: 0-No pain      Patients Stated Pain Goal: (P) 5 (01/15/23 1343)  Complications: No notable events documented.

## 2023-01-15 NOTE — Progress Notes (Signed)
Consent for EGD procedure obtained over the phone with a 2 nurse verification with Pt.'s sister [name]. Sister denied any questions regarding consent at this time and agrees with procedure.

## 2023-01-15 NOTE — Progress Notes (Signed)
PROGRESS NOTE  Warren Sullivan WJX:[id number] DOB: 1960/09/01 DOA: 01/14/2023 PCP: Sherol Dade, DO (Inactive)   LOS: 0 days   Brief Narrative / Interim history: 62 year old male with dementia, HTN, CVA, residing in a long-term nursing home, comes to the hospital with coffee-ground emesis.  On my evaluation patient has no complaints, denies any abdominal pain, no nausea or vomiting.  Subjective / 24h Interval events: No complaints this morning.  Does not know a whole lot about why he is here  Assesement and Plan: Principal Problem:   GI bleed Active Problems:   Essential hypertension   Dementia without behavioral disturbance (HCC)  Principal problem Coffee-ground emesis -GI consulted, appreciate input.  He has a history of liver cirrhosis, had a similar presentation about a year ago and EGD at that point was unremarkable -Hemoglobin stable, no need for transfusion yet, continue to monitor -Continue PPI  Active problems Dementia without behavioral disturbances -nursing home resident.  MOST form from nursing home states full scope of treatment -Hold home medications, n.p.o.  Essential hypertension -Hold home clonidine with concern for an active bleed  BPH-hold home medications, n.p.o. now  Hyperlipidemia-hold statin, n.p.o.  Scheduled Meds:  Chlorhexidine Gluconate Cloth  6 each Topical Q0600   [START ON 01/18/2023] pantoprazole  40 mg Intravenous Q12H   Continuous Infusions:  lactated ringers 100 mL/hr at 01/15/23 0354   pantoprazole 8 mg/hr (01/15/23 0934)   PRN Meds:.acetaminophen **OR** acetaminophen, ondansetron **OR** ondansetron (ZOFRAN) IV, mouth rinse, polyethylene glycol  Current Outpatient Medications  Medication Instructions   acetaminophen (TYLENOL) 1,300 mg, Oral, Every 8 hours PRN   albuterol (PROVENTIL) 2.5 mg, Nebulization, Every 4 hours PRN   atorvastatin (LIPITOR) 20 mg, Oral, Daily at bedtime   Calcium Carbonate (CALCIUM 500 PO) 1 tablet,  Oral, Daily   Cholecalciferol 5,000 Units, Oral, Daily   cloNIDine (CATAPRES) 0.2 mg, Oral, 2 times daily   dicyclomine (BENTYL) 20 MG tablet Take one every 8 hours for abd cramps   gabapentin (NEURONTIN) 300 mg, Oral, 3 times daily, Start with one at bedtime for 1 week. Then two at bedtime for one week, then go to full dose.   memantine (NAMENDA) 10 mg, Oral, 2 times daily   metoCLOPramide (REGLAN) 5 mg, Oral, 3 times daily   Multiple Vitamin (MULTIVITAMIN) tablet 1 tablet, Oral, Daily   ondansetron (ZOFRAN) 4 mg, Oral, Every 8 hours PRN   ondansetron (ZOFRAN-ODT) 4 MG disintegrating tablet 4mg  ODT q4 hours prn nausea/vomit   pantoprazole (PROTONIX) 40 mg, Oral, 2 times daily   polyethylene glycol (MIRALAX / GLYCOLAX) 17 g, Oral, Daily   tamsulosin (FLOMAX) 0.4 mg, Oral, Daily   Vascepa 2 g, Oral, 2 times daily   Venlafaxine HCl 75 mg, Oral, Daily    Diet Orders (From admission, onward)     Start     Ordered   01/15/23 0904  Diet NPO time specified  Diet effective now        01/15/23 2130            DVT prophylaxis: SCDs Start: 01/15/23 0100   Lab Results  Component Value Date   PLT 209 01/15/2023      Code Status: Full Code  Family Communication: no family at bedside   Status is: Observation The patient will require care spanning > 2 midnights and should be moved to inpatient because: EGD, monitoring    Level of care: Stepdown  Consultants:  GI  Objective: Vitals:   01/15/23 0749 01/15/23 0800 01/15/23  0835 01/15/23 0900  BP:  (!) 129/91  (!) 145/88  Pulse:  87  93  Resp:  18  (!) 22  Temp: 99.5 F (37.5 C)     TempSrc: Oral     SpO2:  94% 94% 98%  Weight:      Height:        Intake/Output Summary (Last 24 hours) at 01/15/2023 1002 Last data filed at 01/15/2023 0354 Gross per 24 hour  Intake 539.84 ml  Output --  Net 539.84 ml   Wt Readings from Last 3 Encounters:  01/15/23 96.4 kg  01/04/23 107 kg  11/25/22 107.9 kg     Examination:  Constitutional: NAD Eyes: no scleral icterus ENMT: Mucous membranes are moist.  Neck: normal, supple Respiratory: clear to auscultation bilaterally, no wheezing, no crackles. Normal respiratory effort. No accessory muscle use.  Cardiovascular: Regular rate and rhythm, no murmurs / rubs / gallops. No LE edema.  Abdomen: non distended, no tenderness. Bowel sounds positive.  Musculoskeletal: no clubbing / cyanosis.    Data Reviewed: I have independently reviewed following labs and imaging studies   CBC Recent Labs  Lab 01/14/23 1348 01/14/23 2207 01/15/23 0449  WBC 9.5  --  10.0  HGB 15.2 15.2 13.7  HCT 45.5 45.0 41.1  PLT 228  --  209  MCV 88.0  --  89.0  MCH 29.4  --  29.7  MCHC 33.4  --  33.3  RDW 13.3  --  13.2  LYMPHSABS 1.3  --   --   MONOABS 0.5  --   --   EOSABS 0.1  --   --   BASOSABS 0.0  --   --     Recent Labs  Lab 01/14/23 1348 01/15/23 0449  NA 140 141  K 4.4 3.4*  CL 98 101  CO2 31 30  GLUCOSE 131* 113*  BUN 18 23  CREATININE 1.08 1.33*  CALCIUM 9.5 8.6*  AST 16  --   ALT 13  --   ALKPHOS 119  --   BILITOT 0.8  --   ALBUMIN 4.2  --   MG 1.9  --     ------------------------------------------------------------------------------------------------------------------ No results for input(s): "CHOL", "HDL", "LDLCALC", "TRIG", "CHOLHDL", "LDLDIRECT" in the last 72 hours.  Lab Results  Component Value Date   HGBA1C 5.8 (H) 11/25/2022   ------------------------------------------------------------------------------------------------------------------ No results for input(s): "TSH", "T4TOTAL", "T3FREE", "THYROIDAB" in the last 72 hours.  Invalid input(s): "FREET3"  Cardiac Enzymes No results for input(s): "CKMB", "TROPONINI", "MYOGLOBIN" in the last 168 hours.  Invalid input(s): "CK" ------------------------------------------------------------------------------------------------------------------    Component Value Date/Time    BNP 36.0 11/25/2022 0916    CBG: No results for input(s): "GLUCAP" in the last 168 hours.  Recent Results (from the past 240 hour(s))  MRSA Next Gen by PCR, Nasal     Status: None   Collection Time: 01/14/23 10:09 PM   Specimen: Nasal Mucosa; Nasal Swab  Result Value Ref Range Status   MRSA by PCR Next Gen NOT DETECTED NOT DETECTED Final    Comment: (NOTE) The GeneXpert MRSA Assay (FDA approved for NASAL specimens only), is one component of a comprehensive MRSA colonization surveillance program. It is not intended to diagnose MRSA infection nor to guide or monitor treatment for MRSA infections. Test performance is not FDA approved in patients less than 65 years old. Performed at [facility], [address], [place], [place] [address]      Radiology Studies: [facility] [address]  Result Date: 01/14/2023 CLINICAL DATA:  Cough and vomiting EXAM: PORTABLE CHEST 1 VIEW COMPARISON:  Chest radiograph dated 11/27/2022 FINDINGS: Normal lung volumes. No focal consolidations. Left upper quadrant and epigastric lucency. No pleural effusion or pneumothorax. The heart size and mediastinal contours are within normal limits. No acute osseous abnormality. IMPRESSION: 1. No focal consolidations. 2. Left upper quadrant and epigastric lucency, likely the aerated stomach. Electronically Signed   By: Agustin Cree M.D.   On: 01/14/2023 15:21     Pamella Pert, MD, PhD [facility]  Between 7 am - 7 pm I am available, please contact me via Amion (for emergencies) or Securechat (non urgent messages)  Between 7 pm - 7 am I am not available, please contact night coverage MD/APP via Amion

## 2023-01-15 NOTE — Plan of Care (Signed)
  Problem: Education: Goal: Knowledge of General Education information will improve Description: Including pain rating scale, medication(s)/side effects and non-pharmacologic comfort measures Outcome: Progressing   Problem: Safety: Goal: Ability to remain free from injury will improve Outcome: Progressing   

## 2023-01-15 NOTE — Assessment & Plan Note (Signed)
Hematemesis.  Denies hematochezia.  Hemoglobin vital stable at this time.  Hemoglobin 15.2 x 2.  History of liver cirrhosis.  Similar presentation - 01/2022, EGD by Dr. Levon Hedger that was unremarkable. - I talked to Dr. Marletta Lor via secure chat, team will see patient in the morning.  Agreed with IV bolus and drip, n.p.o., trend hemoglobin, IV fluids. -trend Hemoglobin -N.p.o. - bolus given, continue LR at 100cc/hr x 15hrs

## 2023-01-15 NOTE — Progress Notes (Signed)
[facility] [employer] [facility]) Hospital Liaison note:  This patient is currently enrolled in [facility] outpatient-based palliative care. [facility] will continue to follow for discharge disposition. Please call for any outpatient based palliative care related questions or concerns. Thank you, Thea Gist BSN, RN [phone]

## 2023-01-16 DIAGNOSIS — K922 Gastrointestinal hemorrhage, unspecified: Secondary | ICD-10-CM | POA: Diagnosis not present

## 2023-01-16 DIAGNOSIS — K920 Hematemesis: Secondary | ICD-10-CM

## 2023-01-16 DIAGNOSIS — F0390 Unspecified dementia without behavioral disturbance: Secondary | ICD-10-CM | POA: Diagnosis not present

## 2023-01-16 DIAGNOSIS — I10 Essential (primary) hypertension: Secondary | ICD-10-CM | POA: Diagnosis not present

## 2023-01-16 DIAGNOSIS — R112 Nausea with vomiting, unspecified: Secondary | ICD-10-CM

## 2023-01-16 LAB — CBC
HCT: 41 % (ref 39.0–52.0)
Hemoglobin: 13.6 g/dL (ref 13.0–17.0)
MCH: 29.7 pg (ref 26.0–34.0)
MCHC: 33.2 g/dL (ref 30.0–36.0)
MCV: 89.5 fL (ref 80.0–100.0)
Platelets: 208 10*3/uL (ref 150–400)
RBC: 4.58 MIL/uL (ref 4.22–5.81)
RDW: 13.4 % (ref 11.5–15.5)
WBC: 10.6 10*3/uL — ABNORMAL HIGH (ref 4.0–10.5)
nRBC: 0 % (ref 0.0–0.2)

## 2023-01-16 LAB — COMPREHENSIVE METABOLIC PANEL
ALT: 10 U/L (ref 0–44)
AST: 11 U/L — ABNORMAL LOW (ref 15–41)
Albumin: 3.4 g/dL — ABNORMAL LOW (ref 3.5–5.0)
Alkaline Phosphatase: 100 U/L (ref 38–126)
Anion gap: 10 (ref 5–15)
BUN: 24 mg/dL — ABNORMAL HIGH (ref 8–23)
CO2: 29 mmol/L (ref 22–32)
Calcium: 8.8 mg/dL — ABNORMAL LOW (ref 8.9–10.3)
Chloride: 102 mmol/L (ref 98–111)
Creatinine, Ser: 1.01 mg/dL (ref 0.61–1.24)
GFR, Estimated: >60 mL/min (ref >60)
Glucose, Bld: 93 mg/dL (ref 70–99)
Potassium: 3.3 mmol/L — ABNORMAL LOW (ref 3.5–5.1)
Sodium: 141 mmol/L (ref 135–145)
Total Bilirubin: 0.6 mg/dL (ref 0.3–1.2)
Total Protein: 7.4 g/dL (ref 6.5–8.1)

## 2023-01-16 LAB — MAGNESIUM: Magnesium: 1.8 mg/dL (ref 1.7–2.4)

## 2023-01-16 MED ORDER — SUCRALFATE 1 GM/10ML PO SUSP: 1.0000 g | Freq: Three times a day (TID) | ORAL | 0 refills | Status: DC

## 2023-01-16 MED ORDER — METOCLOPRAMIDE HCL 5 MG PO TABS: 5.0000 mg | ORAL_TABLET | Freq: Three times a day (TID) | ORAL | 0 refills | Status: AC

## 2023-01-16 NOTE — Progress Notes (Signed)
Patient slept thorough the night, no complaints of pain. Patient did have two BM's this shift, small bright red blood noted when cleaning patient that stopped. No more blood noted on second BM. Continued to monitor patient.

## 2023-01-16 NOTE — TOC Transition Note (Signed)
Transition of Care [facility]) - CM/SW Discharge Note   Patient Details  Name: Warren Sullivan MRN: [id number] Date of Birth: 1960/11/07  Transition of Care [facility]) CM/SW Contact:  Villa Herb, LCSWA Phone Number: 01/16/2023, 2:48 PM   Clinical Narrative:    CSW updated that pt is medically stable for D/C back to [facility]. CSW spoke to [place] in admissions who confirms pt can return. CSW faxed D/C summary to facility at Debbie's request. CSW updated RN with numbers for room and report. Med necessity completed and sent to the floor for RN. CSW unable to reach pts sister to provide update on D/C. EMS to be called. TOC signing off.   Final next level of care: Long Term Nursing Home Barriers to Discharge: Barriers Resolved   Patient Goals and CMS Choice CMS Medicare.gov Compare Post Acute Care list provided to:: Patient Choice offered to / list presented to : Patient  Discharge Placement                         Discharge Plan and Services Additional resources added to the After Visit Summary for   In-house Referral: Clinical Social Work Discharge Planning Services: CM Consult Post Acute Care Choice: Nursing Home                               Social Determinants of Health (SDOH) Interventions SDOH Screenings   Food Insecurity: No Food Insecurity (01/14/2023)  Housing: Patient Declined (01/14/2023)  Transportation Needs: No Transportation Needs (01/14/2023)  Utilities: Not At Risk (01/14/2023)  Tobacco Use: High Risk (01/14/2023)     Readmission Risk Interventions     No data to display

## 2023-01-16 NOTE — Discharge Summary (Signed)
Physician Discharge Summary  Warren Sullivan:[id number] DOB: [birth year] DOA: 01/14/2023  PCP: Sherol Dade, DO (Inactive)  Admit date: 01/14/2023 Discharge date: 01/16/2023  Admitted From: SNF Disposition:  SNF  Recommendations for Outpatient Follow-up:  Follow up with PCP in 1-2 weeks Follow up with GI as scheduled  Discharge Condition: Stable  CODE STATUS:Full  Diet recommendation: Soft diet as tolerated    Brief/Interim Summary: 62 year old male with dementia, HTN, CVA, residing in a long-term nursing home, comes to the hospital with coffee-ground emesis.  On my evaluation patient has no complaints, denies any abdominal pain, no nausea or vomiting.  Patient admitted as above with concerns over coffee-ground emesis, hospitalist called for admission and GI called in consult.  Status post EGD with multiple nonbleeding ulcers, treated with argon laser as per GI.  Currently no further signs or symptoms of bleeding, hemoglobin stable.  Discharge medications including PPI, sulcal fate per GI.  Plan for outpatient EUS study to be discussed further with patient after discharge in regards to duodenal nodule.  Given resolution of symptoms no further episodes of bleeding and stable labs patient is otherwise stable for discharge back to facility.   Discharge Diagnoses:  Principal Problem:   GI bleed Active Problems:   Essential hypertension   Dementia without behavioral disturbance (HCC)   Upper GI bleed    Discharge Instructions  Discharge Instructions     Discharge patient   Complete by: As directed    Discharge disposition: 03-Skilled Nursing Facility   Discharge patient date: 01/16/2023      Allergies as of 01/16/2023   No Known Allergies      Medication List     STOP taking these medications    gabapentin 300 MG capsule Commonly known as: NEURONTIN   ondansetron 4 MG disintegrating tablet Commonly known as: ZOFRAN-ODT       TAKE these medications     acetaminophen 650 MG CR tablet Commonly known as: TYLENOL Take 1,300 mg by mouth every 8 (eight) hours as needed (general discomfort).   albuterol (2.5 MG/3ML) 0.083% nebulizer solution Commonly known as: PROVENTIL Take 3 mLs (2.5 mg total) by nebulization every 4 (four) hours as needed for wheezing or shortness of breath.   atorvastatin 20 MG tablet Commonly known as: LIPITOR Take 1 tablet (20 mg total) by mouth at bedtime.   CALCIUM 500 PO Take 1 tablet by mouth daily.   Cholecalciferol 25 MCG (1000 UT) capsule Take 5,000 Units by mouth daily.   cloNIDine 0.1 MG tablet Commonly known as: CATAPRES Take 2 tablets (0.2 mg total) by mouth 2 (two) times daily.   dicyclomine 20 MG tablet Commonly known as: BENTYL Take one every 8 hours for abd cramps   memantine 10 MG tablet Commonly known as: NAMENDA Take 10 mg by mouth 2 (two) times daily.   metoCLOPramide 5 MG tablet Commonly known as: Reglan Take 1 tablet (5 mg total) by mouth 3 (three) times daily.   multivitamin tablet Take 1 tablet by mouth daily.   ondansetron 4 MG tablet Commonly known as: ZOFRAN Take 4 mg by mouth every 8 (eight) hours as needed for nausea or vomiting.   pantoprazole 40 MG tablet Commonly known as: Protonix Take 1 tablet (40 mg total) by mouth 2 (two) times daily.   polyethylene glycol 17 g packet Commonly known as: MIRALAX / GLYCOLAX Take 17 g by mouth daily.   sucralfate 1 GM/10ML suspension Commonly known as: CARAFATE Take 10 mLs (1 g total) by  mouth 4 (four) times daily -  with meals and at bedtime.   tamsulosin 0.4 MG Caps capsule Commonly known as: FLOMAX Take 0.4 mg by mouth daily.   Vascepa 1 g capsule Generic drug: icosapent Ethyl Take 2 g by mouth 2 (two) times daily.   Venlafaxine HCl 75 MG Tb24 Take 75 mg by mouth daily.        No Known Allergies  Consultations: GI   Procedures/Studies: DG Chest Port 1 View  Result Date: 01/14/2023 CLINICAL DATA:  Cough  and vomiting EXAM: PORTABLE CHEST 1 VIEW COMPARISON:  Chest radiograph dated 11/27/2022 FINDINGS: Normal lung volumes. No focal consolidations. Left upper quadrant and epigastric lucency. No pleural effusion or pneumothorax. The heart size and mediastinal contours are within normal limits. No acute osseous abnormality. IMPRESSION: 1. No focal consolidations. 2. Left upper quadrant and epigastric lucency, likely the aerated stomach. Electronically Signed   By: Agustin Cree M.D.   On: 01/14/2023 15:21   CT ABDOMEN PELVIS W CONTRAST  Result Date: 01/04/2023 CLINICAL DATA:  Acute generalized abdominal pain. EXAM: CT ABDOMEN AND PELVIS WITH CONTRAST TECHNIQUE: Multidetector CT imaging of the abdomen and pelvis was performed using the standard protocol following bolus administration of intravenous contrast. RADIATION DOSE REDUCTION: This exam was performed according to the departmental dose-optimization program which includes automated exposure control, adjustment of the mA and/or kV according to patient size and/or use of iterative reconstruction technique. CONTRAST:  OMNIPAQUE IOHEXOL 300 MG/ML  SOLN COMPARISON:  Nov 27, 2022. FINDINGS: Lower chest: No acute abnormality. Hepatobiliary: No focal liver abnormality is seen. Minimal cholelithiasis is noted. No inflammation is noted. No biliary dilatation is noted. Pancreas: Unremarkable. No pancreatic ductal dilatation or surrounding inflammatory changes. Spleen: Normal in size without focal abnormality. Adrenals/Urinary Tract: Adrenal glands appear normal. Small nonobstructive left renal calculus is noted. Small left renal cyst is noted for which no further follow-up is required. No hydronephrosis or renal obstruction is noted. Urinary bladder is unremarkable. Stomach/Bowel: Moderate size sliding-type hiatal hernia is noted. There is no evidence of bowel obstruction or inflammation. The appendix appears normal. Sigmoid diverticulosis is noted without inflammation.  Vascular/Lymphatic: Aortic atherosclerosis. No enlarged abdominal or pelvic lymph nodes. Reproductive: Prostate is unremarkable. Other: No abdominal wall hernia or abnormality. No abdominopelvic ascites. Musculoskeletal: No acute or significant osseous findings. IMPRESSION: Minimal cholelithiasis. Small nonobstructive left renal calculus. Moderate size sliding-type hiatal hernia. Sigmoid diverticulosis without inflammation. Aortic Atherosclerosis (ICD10-I70.0). Electronically Signed   By: Lupita Raider M.D.   On: 01/04/2023 10:34     Subjective: No acute issues or events overnight, patient denies any further bleeding nausea vomiting diarrhea constipation headache fever chills or chest pain   Discharge Exam: Vitals:   01/16/23 0534 01/16/23 1300  BP: (!) 152/99 (!) 166/127  Pulse: 94   Resp: 18 20  Temp: 98.2 F (36.8 C) 98.6 F (37 C)  SpO2: 94% 98%   Vitals:   01/15/23 2247 01/16/23 0025 01/16/23 0534 01/16/23 1300  BP: (!) 142/94 (!) 141/92 (!) 152/99 (!) 166/127  Pulse: 84 89 94   Resp:  18 18 20   Temp: 98.2 F (36.8 C) 98.1 F (36.7 C) 98.2 F (36.8 C) 98.6 F (37 C)  TempSrc: Oral Oral  Oral  SpO2: 97% 96% 94% 98%  Weight:      Height:        General: Pt is alert, awake, not in acute distress Cardiovascular: RRR, S1/S2 +, no rubs, no gallops Respiratory: CTA bilaterally, no  wheezing, no rhonchi Abdominal: Soft, NT, ND, bowel sounds + Extremities: no edema, no cyanosis    The results of significant diagnostics from this hospitalization (including imaging, microbiology, ancillary and laboratory) are listed below for reference.     Microbiology: Recent Results (from the past 240 hour(s))  MRSA Next Gen by PCR, Nasal     Status: None   Collection Time: 01/14/23 10:09 PM   Specimen: Nasal Mucosa; Nasal Swab  Result Value Ref Range Status   MRSA by PCR Next Gen NOT DETECTED NOT DETECTED Final    Comment: (NOTE) The GeneXpert MRSA Assay (FDA approved for NASAL  specimens only), is one component of a comprehensive MRSA colonization surveillance program. It is not intended to diagnose MRSA infection nor to guide or monitor treatment for MRSA infections. Test performance is not FDA approved in patients less than 60 years old. Performed at [facility], [address] [place], [place] [address]      Labs: BNP (last 3 results) Recent Labs    11/25/22 0916  BNP 36.0   Basic Metabolic Panel: Recent Labs  Lab 01/14/23 1348 01/15/23 0449 01/16/23 0413  NA 140 141 141  K 4.4 3.4* 3.3*  CL 98 101 102  CO2 31 30 29   GLUCOSE 131* 113* 93  BUN 18 23 24*  CREATININE 1.08 1.33* 1.01  CALCIUM 9.5 8.6* 8.8*  MG 1.9  --  1.8   Liver Function Tests: Recent Labs  Lab 01/14/23 1348 01/16/23 0413  AST 16 11*  ALT 13 10  ALKPHOS 119 100  BILITOT 0.8 0.6  PROT 9.0* 7.4  ALBUMIN 4.2 3.4*   No results for input(s): "LIPASE", "AMYLASE" in the last 168 hours. No results for input(s): "AMMONIA" in the last 168 hours. CBC: Recent Labs  Lab 01/14/23 1348 01/14/23 2207 01/15/23 0449 01/15/23 1213 01/15/23 1644 01/16/23 0413  WBC 9.5  --  10.0 8.8  --  10.6*  NEUTROABS 7.5  --   --   --   --   --   HGB 15.2 15.2 13.7 13.7 13.8 13.6  HCT 45.5 45.0 41.1 41.3 42.4 41.0  MCV 88.0  --  89.0 88.6  --  89.5  PLT 228  --  209 206  --  208   Urinalysis    Component Value Date/Time   COLORURINE YELLOW 06/08/2022 1742   APPEARANCEUR CLEAR 06/08/2022 1742   LABSPEC 1.026 06/08/2022 1742   PHURINE 6.0 06/08/2022 1742   GLUCOSEU NEGATIVE 06/08/2022 1742   HGBUR SMALL (A) 06/08/2022 1742   BILIRUBINUR NEGATIVE 06/08/2022 1742   KETONESUR 5 (A) 06/08/2022 1742   PROTEINUR 100 (A) 06/08/2022 1742   UROBILINOGEN 0.2 02/19/2013 1857   NITRITE NEGATIVE 06/08/2022 1742   LEUKOCYTESUR NEGATIVE 06/08/2022 1742   Sepsis Labs Recent Labs  Lab 01/14/23 1348 01/15/23 0449 01/15/23 1213 01/16/23 0413  WBC 9.5 10.0 8.8 10.6*   Microbiology Recent  Results (from the past 240 hour(s))  MRSA Next Gen by PCR, Nasal     Status: None   Collection Time: 01/14/23 10:09 PM   Specimen: Nasal Mucosa; Nasal Swab  Result Value Ref Range Status   MRSA by PCR Next Gen NOT DETECTED NOT DETECTED Final    Comment: (NOTE) The GeneXpert MRSA Assay (FDA approved for NASAL specimens only), is one component of a comprehensive MRSA colonization surveillance program. It is not intended to diagnose MRSA infection nor to guide or monitor treatment for MRSA infections. Test performance is not FDA approved  in patients less than 69 years old. Performed at [facility], [address] [place], [place] [address]      Time coordinating discharge: Over 30 minutes  SIGNED:   Azucena Fallen, DO [facility] 01/16/2023, 2:37 PM Pager   If 7PM-7AM, please contact night-coverage [email or web address]

## 2023-01-16 NOTE — Progress Notes (Signed)
Subjective: Denies abdominal pain, nausea or vomiting. No BMs this morning.   Objective: Vital signs in last 24 hours: Temp:  [98.1 F (36.7 C)-98.8 F (37.1 C)] 98.2 F (36.8 C) (07/10 0534) Pulse Rate:  [78-95] 94 (07/10 0534) Resp:  [12-22] 18 (07/10 0534) BP: (110-152)/(80-99) 152/99 (07/10 0534) SpO2:  [91 %-100 %] 94 % (07/10 0534) Last BM Date : 01/15/23 General:   Alert and oriented, pleasant Eyes:  No icterus, sclera clear. Conjuctiva pink.  Mouth:  Without lesions, mucosa pink and moist.  Heart:  S1, S2 present, no murmurs noted.  Lungs: Clear to auscultation bilaterally, without wheezing, rales, or rhonchi.  Abdomen:  Bowel sounds present, soft, non-tender, non-distended. No HSM or hernias noted. No rebound or guarding. No masses appreciated  Extremities:  Without clubbing or edema. Neurologic:  Alert and  oriented x4;  grossly normal neurologically. Skin:  Warm and dry, intact without significant lesions.  Psych:  Alert and cooperative. Normal mood and affect.  Intake/Output from previous day: 07/09 0701 - 07/10 0700 In: 1637.8 [P.O.:720; I.V.:917.8] Out: 640 [Urine:400; Stool:240]   Lab Results: Recent Labs    01/15/23 0449 01/15/23 1213 01/15/23 1644 01/16/23 0413  WBC 10.0 8.8  --  10.6*  HGB 13.7 13.7 13.8 13.6  HCT 41.1 41.3 42.4 41.0  PLT 209 206  --  208   BMET Recent Labs    01/14/23 1348 01/15/23 0449 01/16/23 0413  NA 140 141 141  K 4.4 3.4* 3.3*  CL 98 101 102  CO2 31 30 29   GLUCOSE 131* 113* 93  BUN 18 23 24*  CREATININE 1.08 1.33* 1.01  CALCIUM 9.5 8.6* 8.8*   LFT Recent Labs    01/14/23 1348 01/16/23 0413  PROT 9.0* 7.4  ALBUMIN 4.2 3.4*  AST 16 11*  ALT 13 10  ALKPHOS 119 100  BILITOT 0.8 0.6   Studies/Results: DG Chest Port 1 View  Result Date: 01/14/2023 CLINICAL DATA:  Cough and vomiting EXAM: PORTABLE CHEST 1 VIEW COMPARISON:  Chest radiograph dated 11/27/2022 FINDINGS: Normal lung volumes. No focal  consolidations. Left upper quadrant and epigastric lucency. No pleural effusion or pneumothorax. The heart size and mediastinal contours are within normal limits. No acute osseous abnormality. IMPRESSION: 1. No focal consolidations. 2. Left upper quadrant and epigastric lucency, likely the aerated stomach. Electronically Signed   By: Agustin Cree M.D.   On: 01/14/2023 15:21    Assessment: Warren Sullivan is a 62 y.o. year old male with a history of COPD, CVA with left hemiparesis, dementia, hypertension, hyperlipidemia, BPH, GERD, admission February 2023 with coffee-ground emesis, found to have ulcerative esophagitis, H. pylori gastritis, duodenitis.  Documented H. pylori eradication on repeat EGD in July 2023, admission in May 2024 with pneumonia/ coffee-ground emesis with recommendations for outpatient EGD which he was lost to follow up for, returned to ED with coffee ground emesis, GI consulted for further evaluation.   Coffee ground emesis: recurrent, EGD yesterday with LA Grade C esophagitis with no bleeding, 6 cm hiatal hernia, Scar in the gastric antrum. 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 Recommended to continue with PPI BID, carafate 1g Q6h, discharge on Omeprazole 40mg  capsule (open/swallow granules). Will need outpatient referral for EUS due to duodenal nodule. May consider outpatient surgical evaluation for hiatal hernia repair if having recurrent symptoms   Nausea and Vomiting:  started on Reglan yesterday. Some better today. Could  Consider outpatient GES if n/v persisting    Plan: PPI BID Carafate 1g Q6H Follow for path results Can advance to soft diet as tolerated  Trend h&h, monitor for overt GI bleeding Consider GES as outpatient if n/v persist Referral for outpatient EUS for duodenal nodule   LOS: 1 day    01/16/2023, 8:54 AM  Jashay Roddy L. Jeanmarie Hubert,  MSN, APRN, AGNP-C Adult-Gerontology Nurse Practitioner [facility]

## 2023-01-17 ENCOUNTER — Other Ambulatory Visit: Payer: Self-pay

## 2023-01-17 ENCOUNTER — Encounter (HOSPITAL_COMMUNITY): Payer: Self-pay

## 2023-01-17 ENCOUNTER — Telehealth (INDEPENDENT_AMBULATORY_CARE_PROVIDER_SITE_OTHER): Payer: Self-pay | Admitting: Gastroenterology

## 2023-01-17 ENCOUNTER — Encounter (INDEPENDENT_AMBULATORY_CARE_PROVIDER_SITE_OTHER): Payer: Self-pay | Admitting: *Deleted

## 2023-01-17 ENCOUNTER — Emergency Department (HOSPITAL_COMMUNITY)
Admission: EM | Admit: 2023-01-17 | Discharge: 2023-01-17 | Disposition: A | Discharge status: Home / Self Care | Payer: Medicare Other | Attending: Student | Admitting: Student

## 2023-01-17 DIAGNOSIS — Z8673 Personal history of transient ischemic attack (TIA), and cerebral infarction without residual deficits: Secondary | ICD-10-CM | POA: Insufficient documentation

## 2023-01-17 DIAGNOSIS — I10 Essential (primary) hypertension: Secondary | ICD-10-CM | POA: Insufficient documentation

## 2023-01-17 DIAGNOSIS — R112 Nausea with vomiting, unspecified: Secondary | ICD-10-CM | POA: Diagnosis not present

## 2023-01-17 DIAGNOSIS — Z79899 Other long term (current) drug therapy: Secondary | ICD-10-CM | POA: Diagnosis not present

## 2023-01-17 DIAGNOSIS — K920 Hematemesis: Secondary | ICD-10-CM | POA: Diagnosis present

## 2023-01-17 LAB — CBC WITH DIFFERENTIAL/PLATELET
Abs Immature Granulocytes: 0.02 10*3/uL (ref 0.00–0.07)
Basophils Absolute: 0 10*3/uL (ref 0.0–0.1)
Basophils Relative: 0 %
Eosinophils Absolute: 0.1 10*3/uL (ref 0.0–0.5)
Eosinophils Relative: 1 %
HCT: 43.2 % (ref 39.0–52.0)
Hemoglobin: 14.5 g/dL (ref 13.0–17.0)
Immature Granulocytes: 0 %
Lymphocytes Relative: 13 %
Lymphs Abs: 1.2 10*3/uL (ref 0.7–4.0)
MCH: 29.4 pg (ref 26.0–34.0)
MCHC: 33.6 g/dL (ref 30.0–36.0)
MCV: 87.6 fL (ref 80.0–100.0)
Monocytes Absolute: 0.6 10*3/uL (ref 0.1–1.0)
Monocytes Relative: 6 %
Neutro Abs: 7.8 10*3/uL — ABNORMAL HIGH (ref 1.7–7.7)
Neutrophils Relative %: 80 %
Platelets: 236 10*3/uL (ref 150–400)
RBC: 4.93 MIL/uL (ref 4.22–5.81)
RDW: 13.3 % (ref 11.5–15.5)
WBC: 9.7 10*3/uL (ref 4.0–10.5)
nRBC: 0 % (ref 0.0–0.2)

## 2023-01-17 LAB — BASIC METABOLIC PANEL
Anion gap: 12 (ref 5–15)
BUN: 23 mg/dL (ref 8–23)
CO2: 28 mmol/L (ref 22–32)
Calcium: 9 mg/dL (ref 8.9–10.3)
Chloride: 102 mmol/L (ref 98–111)
Creatinine, Ser: 0.94 mg/dL (ref 0.61–1.24)
GFR, Estimated: >60 mL/min (ref >60)
Glucose, Bld: 129 mg/dL — ABNORMAL HIGH (ref 70–99)
Potassium: 3.4 mmol/L — ABNORMAL LOW (ref 3.5–5.1)
Sodium: 142 mmol/L (ref 135–145)

## 2023-01-17 LAB — PROTIME-INR
INR: 1.1 (ref 0.8–1.2)
Prothrombin Time: 14.4 seconds (ref 11.4–15.2)

## 2023-01-17 LAB — SURGICAL PATHOLOGY

## 2023-01-17 MED ORDER — CLONIDINE HCL 0.1 MG PO TABS
0.1000 mg | ORAL_TABLET | Freq: Once | ORAL | Status: AC
  Administered 2023-01-17: 0.1 mg via ORAL
  Filled 2023-01-17: qty 1

## 2023-01-17 NOTE — Progress Notes (Signed)
[facility] Hospital Liaison Note  This patient is currently enrolled in [facility] outpatient-based palliative care.   [facility] will continue to follow for discharge disposition.   Please call for any outpatient based palliative care related questions or concerns.   Thank you.   Shanita Wicker, LCSW [facility] Hospital Liaison [phone]  

## 2023-01-17 NOTE — Discharge Instructions (Addendum)
You have been seen today for your complaint of vomiting. Your lab work was reassuring. Your discharge medications include your home medications.  Continue taking them as prescribed. Follow up with: Your primary care provider within the next week. Please seek immediate medical care if you develop any of the following symptoms:  At this time there does not appear to be the presence of an emergent medical condition, however there is always the potential for conditions to change. Please read and follow the below instructions.  Do not take your medicine if  develop an itchy rash, swelling in your mouth or lips, or difficulty breathing; call 911 and seek immediate emergency medical attention if this occurs.  You may review your lab tests and imaging results in their entirety on your MyChart account.  Please discuss all results of fully with your primary care provider and other specialist at your follow-up visit.  Note: Portions of this text may have been transcribed using voice recognition software. Every effort was made to ensure accuracy; however, inadvertent computerized transcription errors may still be present.

## 2023-01-17 NOTE — ED Triage Notes (Signed)
Pt BIB [facility] from [facility] c/o coffee ground emesis starting today. Recently seen for same. VSS en route.

## 2023-01-17 NOTE — ED Provider Notes (Signed)
[place] EMERGENCY DEPARTMENT AT [facility] Provider Note   CSN: [id number] Arrival date & time: 01/17/23  1141     History  Chief Complaint  Patient presents with   Hematemesis    Warren Sullivan is a 62 y.o. male.  With a history of hypertension, CVA with right-sided deficits, GERD, depression, vitamin D deficiency who presents to the ED for evaluation of hematemesis.  Patient presents from [facility] nursing home with complaints of coffee-ground emesis.  EMS reports that patient was found to have dark brown emesis on his face and bed sheets today and so they sent him to the emergency department.  Patient specifically states he believes this is due to all of the chocolate candy he has been eating lately.  He denies any nausea at this time.  No chest pain or shortness of breath.  No abdominal pain.  Patient presented to the ED for same on 01/14/2023.  He was admitted and underwent EGD on 01/15/2023.  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  No active bleeding.  HPI     Home Medications Prior to Admission medications   Medication Sig Start Date End Date Taking? Authorizing Provider  acetaminophen (TYLENOL) 650 MG CR tablet Take 1,300 mg by mouth every 8 (eight) hours as needed (general discomfort).    [provider]  albuterol (PROVENTIL) (2.5 MG/3ML) 0.083% nebulizer solution Take 3 mLs (2.5 mg total) by nebulization every 4 (four) hours as needed for wheezing or shortness of breath. 01/30/22   Johnson, Clanford L, MD  atorvastatin (LIPITOR) 20 MG tablet Take 1 tablet (20 mg total) by mouth at bedtime. 06/11/22   Glade Lloyd, MD  Calcium Carbonate (CALCIUM 500 PO) Take 1 tablet by mouth daily.    [provider]  Cholecalciferol 25 MCG (1000 UT) capsule Take 5,000 Units by mouth daily.    [provider]  cloNIDine  (CATAPRES) 0.1 MG tablet Take 2 tablets (0.2 mg total) by mouth 2 (two) times daily. 08/20/21   Shon Hale, MD  dicyclomine (BENTYL) 20 MG tablet Take one every 8 hours for abd cramps 01/04/23   [name], MD  memantine (NAMENDA) 10 MG tablet Take 10 mg by mouth 2 (two) times daily. 07/24/21   [provider]  metoCLOPramide (REGLAN) 5 MG tablet Take 1 tablet (5 mg total) by mouth 3 (three) times daily. 01/16/23   Azucena Fallen, MD  Multiple Vitamin (MULTIVITAMIN) tablet Take 1 tablet by mouth daily.    [provider]  ondansetron (ZOFRAN) 4 MG tablet Take 4 mg by mouth every 8 (eight) hours as needed for nausea or vomiting. 02/26/22   [provider]  pantoprazole (PROTONIX) 40 MG tablet Take 1 tablet (40 mg total) by mouth 2 (two) times daily. 08/20/21 01/15/23  Shon Hale, MD  polyethylene glycol (MIRALAX / GLYCOLAX) 17 g packet Take 17 g by mouth daily. 12/02/22   Vassie Loll, MD  sucralfate (CARAFATE) 1 GM/10ML suspension Take 10 mLs (1 g total) by mouth 4 (four) times daily -  with meals and at bedtime. 01/16/23   Azucena Fallen, MD  tamsulosin (FLOMAX) 0.4 MG CAPS capsule Take 0.4 mg by mouth daily. 07/11/21   [provider]  VASCEPA 1 g capsule Take 2 g by mouth 2 (two) times daily. 08/08/21   [provider]  Venlafaxine HCl 75 MG TB24 Take  75 mg by mouth daily.    [provider]      Allergies    Patient has no known allergies.    Review of Systems   Review of Systems  Gastrointestinal:  Positive for nausea and vomiting.  All other systems reviewed and are negative.   Physical Exam Updated Vital Signs BP (!) 152/106   Pulse 98   Temp 98.9 F (37.2 C) (Oral)   Resp (!) 24   Ht 5\' 11"  (1.803 m)   Wt 96.2 kg   SpO2 95%   BMI 29.57 kg/m  Physical Exam Vitals and nursing note reviewed.  Constitutional:      General: He is not in acute distress.    Appearance: He is well-developed.     Comments:  Resting comfortably in bed.  Small amount of brown emesis noted around mouth and on T-shirt  HENT:     Head: Normocephalic and atraumatic.  Eyes:     Conjunctiva/sclera: Conjunctivae normal.  Cardiovascular:     Rate and Rhythm: Normal rate and regular rhythm.     Heart sounds: No murmur heard. Pulmonary:     Effort: Pulmonary effort is normal. No respiratory distress.     Breath sounds: Normal breath sounds. No wheezing, rhonchi or rales.  Abdominal:     Palpations: Abdomen is soft.     Tenderness: There is no abdominal tenderness.  Musculoskeletal:        General: No swelling.     Cervical back: Neck supple.  Skin:    General: Skin is warm and dry.     Capillary Refill: Capillary refill takes less than 2 seconds.  Neurological:     Mental Status: He is alert.  Psychiatric:        Mood and Affect: Mood normal.     ED Results / Procedures / Treatments   Labs (all labs ordered are listed, but only abnormal results are displayed) Labs Reviewed  BASIC METABOLIC PANEL - Abnormal; Notable for the following components:      Result Value   Potassium 3.4 (*)    Glucose, Bld 129 (*)    All other components within normal limits  CBC WITH DIFFERENTIAL/PLATELET - Abnormal; Notable for the following components:   Neutro Abs 7.8 (*)    All other components within normal limits  PROTIME-INR    EKG None  Radiology No results found.  Procedures Procedures    Medications Ordered in ED Medications  cloNIDine (CATAPRES) tablet 0.1 mg (0.1 mg Oral Given 01/17/23 1551)    ED Course/ Medical Decision Making/ A&P Clinical Course as of 01/17/23 1620  Thu Jan 17, 2023  1253 Spoke with GI.  He is not concerned.  Patient had an upper endoscopy 2 days ago which was negative.  If BUN to creatinine ratio was within normal limits patient can safely be discharged home [AS]    Clinical Course User Index [AS] Jensine Luz, Edsel Petrin, PA-C                             Medical Decision  Making Amount and/or Complexity of Data Reviewed Labs: ordered.  Risk Prescription drug management.  This patient presents to the ED for concern of emesis, this involves an extensive number of treatment options, and is a complaint that carries with it a high risk of complications and morbidity. The emergent differential diagnosis for vomiting includes, but is not limited to ACS/MI, DKA, Ischemic  bowel, Meningitis, Sepsis, Acute gastric dilation, Adrenal insufficiency, Appendicitis,  Bowel obstruction/ileus, Carbon monoxide poisoning, Cholecystitis, Electrolyte abnormalities, Elevated ICP, Gastric outlet obstruction, Pancreatitis, Ruptured viscus, Biliary colic, Cannabinoid hyperemesis syndrome, Gastritis, Gastroenteritis, Gastroparesis,  Narcotic withdrawal, Peptic ulcer disease, and UTI   Co morbidities that complicate the patient evaluation   hypertension, CVA with right-sided deficits, GERD, depression, vitamin D deficiency  My initial workup includes labs  Additional history obtained from: Nursing notes from this visit. EMS provides a portion of the history  I ordered, reviewed and interpreted labs which include: BMP, CBC, INR.  Labs reassuring.  Hemoglobin up from yesterday  Consultations Obtained:  I requested consultation with the GI Dr. Tanja Port,  and discussed lab and imaging findings as well as pertinent plan - they recommend: Continue Protonix, safe for discharge home  Afebrile, hypertensive but otherwise hemodynamically stable.  62 year old male presenting to the ED for evaluation of potential hematemesis.  Patient states that this is likely secondary to all of the chocolate candy he has been eating.  He has no acute complaints at this time.  His lab workup is reassuring.  His hemoglobin is increased from yesterday.  He had an EGD 2 days ago which was negative for any acute bleeds.  He has not had any episodes of emesis while in the ED.  Overall I have low suspicion for acute  upper GI bleed as the cause of his symptoms today.  I do suspect his emesis is discolored secondary to his oral intake.  GI was consulted and recommends continuing his Protonix.  I did attempt to contact his nursing home multiple times but was unable to speak to any nursing staff.  Patient was intermittently hypoxic in the emergency department but this resolves with improvement in posture.  Unsure if patient has taken his antihypertensives today as I am not able to talk to nursing home staff so we will hold off on aggressively treating this.  Stable at discharge.  At this time there does not appear to be any evidence of an acute emergency medical condition and the patient appears stable for discharge with appropriate outpatient follow up. Diagnosis was discussed with patient who verbalizes understanding of care plan and is agreeable to discharge. I have discussed return precautions with patient who verbalizes understanding. Patient encouraged to follow-up with their PCP within 1 week. All questions answered.  Patient's case discussed with Dr. Posey Rea who agrees with plan to discharge with follow-up.   Note: Portions of this report may have been transcribed using voice recognition software. Every effort was made to ensure accuracy; however, inadvertent computerized transcription errors may still be present.        Final Clinical Impression(s) / ED Diagnoses Final diagnoses:  Nausea and vomiting, unspecified vomiting type    Rx / DC Orders ED Discharge Orders     None         Mora Bellman 01/17/23 1620    Glendora Score, MD 01/17/23 2212

## 2023-01-17 NOTE — Telephone Encounter (Signed)
GI Note  Called by ED for possible coffee ground emesis. As per ED patient was eating chocolate and had emesis which appears like CGE .  Patient had upper endoscopy 2 days ago which demonstrated LA Class C esophagitis . Current HD stable, stable HBG : 14.5 with normal Bun/creat:23/0.94  which goes against active bleeding  Recommend aspiration precaution and PPI PO BID. Avoid NSAIDs  Warren Sullivan GI Attending

## 2023-01-18 ENCOUNTER — Telehealth: Payer: Self-pay | Admitting: *Deleted

## 2023-01-18 ENCOUNTER — Encounter (HOSPITAL_COMMUNITY): Payer: Self-pay | Admitting: Gastroenterology

## 2023-01-18 NOTE — Telephone Encounter (Signed)
-----   Message from [facility] sent at 01/18/2023  6:07 AM EDT ----- Regarding: RE: EUS Dottie, Okay for upper EUS. Would wait 3 to 4 months so that he can heal up his esophagitis and we can follow that up as well at that time. Thanks. GM ----- Message ----- From: Richardson Chiquito, RN Sent: 01/17/2023   4:42 PM EDT To: Lemar Lofty., MD Subject: FW: EUS                                        Dr Meridee Score-  Please review chart and advise if EUS is appropriate. Thanks ----- Message ----- From: Simone Curia Sent: 01/17/2023   3:42 PM EDT To: Loretha Stapler, RN; # Subject: EUS                                            Dr Levon Hedger would like patient to have EUS with  Dr. Meridee Score for duodenal submucosal lesion. Thanks

## 2023-01-19 DIAGNOSIS — K31819 Angiodysplasia of stomach and duodenum without bleeding: Secondary | ICD-10-CM

## 2023-01-21 ENCOUNTER — Other Ambulatory Visit: Payer: Self-pay

## 2023-01-21 DIAGNOSIS — K319 Disease of stomach and duodenum, unspecified: Secondary | ICD-10-CM

## 2023-01-21 NOTE — Telephone Encounter (Signed)
EUS has been scheduled for 05/13/23 at 8 am at [facility] with GM

## 2023-01-21 NOTE — Telephone Encounter (Signed)
Message left with [facility] to have someone return my call to discuss procedure and instructions.

## 2023-01-22 NOTE — Telephone Encounter (Signed)
I spoke with Warren Sullivan at [facility] and advised her of the appt and instructions.  I have also faxed all information to [phone] [facility].

## 2023-03-10 ENCOUNTER — Encounter (HOSPITAL_COMMUNITY): Payer: Self-pay

## 2023-03-10 ENCOUNTER — Other Ambulatory Visit: Payer: Self-pay

## 2023-03-10 ENCOUNTER — Emergency Department (HOSPITAL_COMMUNITY)
Admission: EM | Admit: 2023-03-10 | Discharge: 2023-03-11 | Disposition: A | Discharge status: Skilled Nursing Facility | Payer: Medicare Other | Attending: Emergency Medicine | Admitting: Emergency Medicine

## 2023-03-10 DIAGNOSIS — E119 Type 2 diabetes mellitus without complications: Secondary | ICD-10-CM | POA: Diagnosis not present

## 2023-03-10 DIAGNOSIS — K922 Gastrointestinal hemorrhage, unspecified: Secondary | ICD-10-CM | POA: Diagnosis not present

## 2023-03-10 DIAGNOSIS — R109 Unspecified abdominal pain: Secondary | ICD-10-CM | POA: Diagnosis present

## 2023-03-10 LAB — I-STAT CHEM 8, ED
BUN: 32 mg/dL — ABNORMAL HIGH (ref 8–23)
Calcium, Ion: 1.06 mmol/L — ABNORMAL LOW (ref 1.15–1.40)
Chloride: 103 mmol/L (ref 98–111)
Creatinine, Ser: 1.2 mg/dL (ref 0.61–1.24)
Glucose, Bld: 142 mg/dL — ABNORMAL HIGH (ref 70–99)
HCT: 40 % (ref 39.0–52.0)
Hemoglobin: 13.6 g/dL (ref 13.0–17.0)
Potassium: 4.1 mmol/L (ref 3.5–5.1)
Sodium: 140 mmol/L (ref 135–145)
TCO2: 26 mmol/L (ref 22–32)

## 2023-03-10 LAB — CBC WITH DIFFERENTIAL/PLATELET
Abs Immature Granulocytes: 0.03 10*3/uL (ref 0.00–0.07)
Basophils Absolute: 0 10*3/uL (ref 0.0–0.1)
Basophils Relative: 0 %
Eosinophils Absolute: 0.1 10*3/uL (ref 0.0–0.5)
Eosinophils Relative: 1 %
HCT: 43 % (ref 39.0–52.0)
Hemoglobin: 14.4 g/dL (ref 13.0–17.0)
Immature Granulocytes: 0 %
Lymphocytes Relative: 15 %
Lymphs Abs: 1.6 10*3/uL (ref 0.7–4.0)
MCH: 28.9 pg (ref 26.0–34.0)
MCHC: 33.5 g/dL (ref 30.0–36.0)
MCV: 86.2 fL (ref 80.0–100.0)
Monocytes Absolute: 0.5 10*3/uL (ref 0.1–1.0)
Monocytes Relative: 5 %
Neutro Abs: 8.5 10*3/uL — ABNORMAL HIGH (ref 1.7–7.7)
Neutrophils Relative %: 79 %
Platelets: 214 10*3/uL (ref 150–400)
RBC: 4.99 MIL/uL (ref 4.22–5.81)
RDW: 13.8 % (ref 11.5–15.5)
WBC: 10.8 10*3/uL — ABNORMAL HIGH (ref 4.0–10.5)
nRBC: 0 % (ref 0.0–0.2)

## 2023-03-10 LAB — COMPREHENSIVE METABOLIC PANEL
ALT: 13 U/L (ref 0–44)
AST: 19 U/L (ref 15–41)
Albumin: 3.9 g/dL (ref 3.5–5.0)
Alkaline Phosphatase: 77 U/L (ref 38–126)
Anion gap: 14 (ref 5–15)
BUN: 24 mg/dL — ABNORMAL HIGH (ref 8–23)
CO2: 25 mmol/L (ref 22–32)
Calcium: 9 mg/dL (ref 8.9–10.3)
Chloride: 99 mmol/L (ref 98–111)
Creatinine, Ser: 1.28 mg/dL — ABNORMAL HIGH (ref 0.61–1.24)
GFR, Estimated: >60 mL/min (ref >60)
Glucose, Bld: 144 mg/dL — ABNORMAL HIGH (ref 70–99)
Potassium: 3.5 mmol/L (ref 3.5–5.1)
Sodium: 138 mmol/L (ref 135–145)
Total Bilirubin: 0.8 mg/dL (ref 0.3–1.2)
Total Protein: 7.8 g/dL (ref 6.5–8.1)

## 2023-03-10 LAB — LIPASE, BLOOD: Lipase: 21 U/L (ref 11–51)

## 2023-03-10 MED ORDER — SODIUM CHLORIDE 0.9 % IV BOLUS
1000.0000 mL | Freq: Once | INTRAVENOUS | Status: AC
  Administered 2023-03-10: 1000 mL via INTRAVENOUS

## 2023-03-10 MED ORDER — ONDANSETRON HCL 4 MG/2ML IJ SOLN
4.0000 mg | Freq: Once | INTRAMUSCULAR | Status: AC
  Administered 2023-03-10: 4 mg via INTRAVENOUS
  Filled 2023-03-10: qty 2

## 2023-03-10 MED ORDER — SUCRALFATE 1 G PO TABS: 1.0000 g | ORAL_TABLET | Freq: Two times a day (BID) | ORAL | 0 refills | Status: DC

## 2023-03-10 MED ORDER — PANTOPRAZOLE SODIUM 40 MG IV SOLR
40.0000 mg | Freq: Once | INTRAVENOUS | Status: AC
  Administered 2023-03-10: 40 mg via INTRAVENOUS
  Filled 2023-03-10: qty 10

## 2023-03-10 MED ORDER — ONDANSETRON 4 MG PO TBDP: ORAL_TABLET | ORAL | 0 refills | Status: DC

## 2023-03-10 NOTE — ED Notes (Signed)
Family at the bedside.

## 2023-03-10 NOTE — ED Notes (Signed)
Patient monitor ringing , patient's 02 sat was reading 70. Asked Paramedic Molli Hazard to check on patient. Patient found to have bluish tint. Paramedic placed patient on 2l.

## 2023-03-10 NOTE — ED Triage Notes (Signed)
Pt reports brown emesis with hx of GI bleed.

## 2023-03-10 NOTE — ED Notes (Signed)
Pt incontinent of urine. Pericare performed and new brief placed.

## 2023-03-10 NOTE — Discharge Instructions (Signed)
Follow-up with your family doctor later this week.  Return if any problems

## 2023-03-10 NOTE — ED Provider Notes (Signed)
[place] EMERGENCY DEPARTMENT AT [facility] Provider Note   CSN: [id number] Arrival date & time: 03/10/23  4098     History {Add pertinent medical, surgical, social history, OB history to HPI:1} Chief Complaint  Patient presents with   Emesis    Warren Sullivan is a 62 y.o. male.  Patient has a history of diabetes and a stroke and esophagitis.  He threw up some coffee-ground material 3 times a day.   Emesis      Home Medications Prior to Admission medications   Medication Sig Start Date End Date Taking? Authorizing Provider  ondansetron (ZOFRAN-ODT) 4 MG disintegrating tablet 4mg  ODT q4 hours prn nausea/vomit 03/10/23  Yes [name], MD  sucralfate (CARAFATE) 1 g tablet Take 1 tablet (1 g total) by mouth 2 (two) times daily. 03/10/23  Yes [name], MD  acetaminophen (TYLENOL) 650 MG CR tablet Take 1,300 mg by mouth every 8 (eight) hours as needed (general discomfort).    [provider]  albuterol (PROVENTIL) (2.5 MG/3ML) 0.083% nebulizer solution Take 3 mLs (2.5 mg total) by nebulization every 4 (four) hours as needed for wheezing or shortness of breath. 01/30/22   Johnson, Clanford L, MD  atorvastatin (LIPITOR) 20 MG tablet Take 1 tablet (20 mg total) by mouth at bedtime. 06/11/22   Glade Lloyd, MD  Calcium Carbonate (CALCIUM 500 PO) Take 1 tablet by mouth daily.    [provider]  Cholecalciferol 25 MCG (1000 UT) capsule Take 5,000 Units by mouth daily.    [provider]  cloNIDine (CATAPRES) 0.1 MG tablet Take 2 tablets (0.2 mg total) by mouth 2 (two) times daily. 08/20/21   Shon Hale, MD  dicyclomine (BENTYL) 20 MG tablet Take one every 8 hours for abd cramps 01/04/23   [name], MD  memantine (NAMENDA) 10 MG tablet Take 10 mg by mouth 2 (two) times daily. 07/24/21   [provider]  metoCLOPramide (REGLAN) 5 MG tablet Take 1 tablet (5 mg total) by mouth 3 (three) times daily. 01/16/23   Azucena Fallen, MD  Multiple Vitamin (MULTIVITAMIN) tablet Take 1 tablet by mouth daily.    [provider]  ondansetron (ZOFRAN) 4 MG tablet Take 4 mg by mouth every 8 (eight) hours as needed for nausea or vomiting. 02/26/22   [provider]  pantoprazole (PROTONIX) 40 MG tablet Take 1 tablet (40 mg total) by mouth 2 (two) times daily. 08/20/21 01/15/23  Shon Hale, MD  polyethylene glycol (MIRALAX / GLYCOLAX) 17 g packet Take 17 g by mouth daily. 12/02/22   Vassie Loll, MD  tamsulosin (FLOMAX) 0.4 MG CAPS capsule Take 0.4 mg by mouth daily. 07/11/21   [provider]  VASCEPA 1 g capsule Take 2 g by mouth 2 (two) times daily. 08/08/21   [provider]  Venlafaxine HCl 75 MG TB24 Take 75 mg by mouth daily.    [provider]      Allergies    Patient has no known allergies.    Review of Systems   Review of Systems  Gastrointestinal:  Positive for vomiting.    Physical Exam Updated Vital Signs BP (!) 173/93   Pulse 88   Temp 99.1 F (37.3 C) (Oral)   Resp 13   Ht 5\' 11"  (1.803 m)   Wt 96.2 kg   SpO2 93%   BMI 29.57 kg/m  Physical Exam  ED Results / Procedures / Treatments   Labs (all labs ordered are  listed, but only abnormal results are displayed) Labs Reviewed  CBC WITH DIFFERENTIAL/PLATELET - Abnormal; Notable for the following components:      Result Value   WBC 10.8 (*)    Neutro Abs 8.5 (*)    All other components within normal limits  COMPREHENSIVE METABOLIC PANEL - Abnormal; Notable for the following components:   Glucose, Bld 144 (*)    BUN 24 (*)    Creatinine, Ser 1.28 (*)    All other components within normal limits  I-STAT CHEM 8, ED - Abnormal; Notable for the following components:   BUN 32 (*)    Glucose, Bld 142 (*)    Calcium, Ion 1.06 (*)    All other components within normal limits  LIPASE, BLOOD  POC OCCULT BLOOD, ED    EKG None  Radiology No results found.  Procedures Procedures  {Document cardiac  monitor, telemetry assessment procedure when appropriate:1}  Medications Ordered in ED Medications  pantoprazole (PROTONIX) injection 40 mg (40 mg Intravenous Given 03/10/23 0847)  ondansetron (ZOFRAN) injection 4 mg (4 mg Intravenous Given 03/10/23 0847)  sodium chloride 0.9 % bolus 1,000 mL (0 mLs Intravenous Stopped 03/10/23 1259)    ED Course/ Medical Decision Making/ A&P  Patient has had no vomiting in the 5 hours series here in the emergency department.  I spoke with GI and they wanted to start Carafate twice a day and have the patient go back to the nursing home.  He is hemodynamically stable.  His hemoglobin has been stable at 14.4.  Rectal exam was heme-negative Click here for ABCD2, HEART and other calculatorsREFRESH Note before signing :1}                              Medical Decision Making Amount and/or Complexity of Data Reviewed Labs: ordered.  Risk Prescription drug management.   Upper GI bleed possible esophagitis.  Patient will be discharged home with Carafate and follow-up with GI or family doctor  {Document critical care time when appropriate:1} {Document review of labs and clinical decision tools ie heart score, Chads2Vasc2 etc:1}  {Document your independent review of radiology images, and any outside records:1} {Document your discussion with family members, caretakers, and with consultants:1} {Document social determinants of health affecting pt's care:1} {Document your decision making why or why not admission, treatments were needed:1} Final Clinical Impression(s) / ED Diagnoses Final diagnoses:  Upper GI bleed    Rx / DC Orders ED Discharge Orders          Ordered    ondansetron (ZOFRAN-ODT) 4 MG disintegrating tablet        03/10/23 1301    sucralfate (CARAFATE) 1 g tablet  2 times daily        03/10/23 1301

## 2023-04-08 ENCOUNTER — Ambulatory Visit (INDEPENDENT_AMBULATORY_CARE_PROVIDER_SITE_OTHER): Payer: Medicare Other | Admitting: Gastroenterology

## 2023-04-08 ENCOUNTER — Encounter (INDEPENDENT_AMBULATORY_CARE_PROVIDER_SITE_OTHER): Payer: Self-pay | Admitting: Gastroenterology

## 2023-04-08 ENCOUNTER — Ambulatory Visit: Payer: Medicare Other | Admitting: Gastroenterology

## 2023-04-08 VITALS — BP 152/76 | HR 57 | Temp 97.8°F | Ht 71.0 in | Wt 201.0 lb

## 2023-04-08 DIAGNOSIS — K209 Esophagitis, unspecified without bleeding: Secondary | ICD-10-CM

## 2023-04-08 NOTE — Progress Notes (Signed)
Referring Provider: No ref. provider found Primary Care Physician:  Sherol Dade, DO Primary GI Physician: Dr. Levon Hedger   Chief Complaint  Patient presents with   Follow-up    Patient here today for a follow up. Patient is not having any issue.   HPI:   Warren Sullivan is a 62 y.o. male with past medical history of COPD, CVA with left hemiparesis, dementia, hypertension, hyperlipidemia, BPH, GERD, admission February 2023 with coffee-ground emesis, found to have ulcerative esophagitis, H. pylori gastritis, duodenitis. Documented H. pylori eradication on repeat EGD in July 2023   Patient presenting today for hospital follow up  Hospitalized in July with coffee ground emesis, EGD showed LA Grade C esophagitis with no bleeding, 6 cm hiatal hernia, Scar in the gastric antrum. 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 Recommended to continue with PPI BID, carafate 1g Q6h, discharge on Omeprazole 40mg  capsule (open/swallow granules). Will need outpatient referral for EUS due to duodenal nodule, he is scheduled for 11/4 with Dr. Meridee Score  Last labs with hgb 14.4 on 03/10/23, BUN 24 (creatnine also elevated 1.28)  Present:  Patient feeling well. He has no GI complaints today. Feels he is eating well. Denies dysphagia. He denies nausea, vomiting, gerd symptoms, rectal bleeding or melena. Patient is on protonix 40mg  BID.  He was notably seen in the ED at [facility] on 9/1 for concerns of CGE, though noted to be eating chocolate, GI was consulted, given hgb stable and stool heme negative, advised to start carafate and d/c. Family who is with him today is unsure of any further episodes of CGE, states they are no longer feeding him chocolate  Last Endoscopy: as above   Recommendations:    Past Medical History:  Diagnosis Date   AKI (acute kidney injury) (HCC) 08/18/2021    CVA (cerebral vascular accident) (HCC)    Dehydration 08/18/2021   Dementia in other diseases classified elsewhere, severe, without behavioral disturbance, psychotic disturbance, mood disturbance, and anxiety (HCC)    Fall at home, initial encounter 08/29/2021   GERD (gastroesophageal reflux disease)    Hemiplegia, unspecified affecting right dominant side (HCC)    Hyperlipidemia    Hypertension    MDD (major depressive disorder)    Pneumonia 08/28/2021   Small bowel obstruction (HCC) 08/17/2021   Stroke (HCC)    Vitamin D deficiency     Past Surgical History:  Procedure Laterality Date   BIOPSY  08/18/2021   Procedure: BIOPSY;  Surgeon: Dolores Frame, MD;  Location: [facility];  Service: Gastroenterology;;  Bonnielee Haff and distal esophagus biopsies    BIOPSY  10/03/2021   Procedure: BIOPSY;  Surgeon: Dolores Frame, MD;  Location: [facility];  Service: Gastroenterology;;   BIOPSY  01/30/2022   Procedure: BIOPSY;  Surgeon: Dolores Frame, MD;  Location: [facility];  Service: Gastroenterology;;   BIOPSY  01/15/2023   Procedure: BIOPSY;  Surgeon: Marguerita Merles, Reuel Boom, MD;  Location: [facility];  Service: Gastroenterology;;   ESOPHAGOGASTRODUODENOSCOPY (EGD) WITH PROPOFOL N/A 08/18/2021   Surgeon: Marguerita Merles, Reuel Boom, MD; severe acute nonspecific esophagitis with ulceration and reactive hyperplasia, peptic duodenitis, acute chronic gastritis with H. pylori, 3 cm hiatal hernia.   ESOPHAGOGASTRODUODENOSCOPY (EGD) WITH PROPOFOL N/A 10/03/2021   Surgeon: Marguerita Merles, Reuel Boom, MD; myocarditis, chronic gastritis with H. pylori, 2 cm hiatal hernia, erythematous duodenopathy.   ESOPHAGOGASTRODUODENOSCOPY (EGD) WITH PROPOFOL N/A 01/30/2022  Procedure: ESOPHAGOGASTRODUODENOSCOPY (EGD) WITH PROPOFOL;  Surgeon: Dolores Frame, MD;  Location: [facility];  Service: Gastroenterology;  Laterality: N/A;    ESOPHAGOGASTRODUODENOSCOPY (EGD) WITH PROPOFOL N/A 01/15/2023   Procedure: ESOPHAGOGASTRODUODENOSCOPY (EGD) WITH PROPOFOL;  Surgeon: Dolores Frame, MD;  Location: [facility];  Service: Gastroenterology;  Laterality: N/A;   HERNIA REPAIR     HOT HEMOSTASIS  01/15/2023   Procedure: HOT HEMOSTASIS (ARGON PLASMA COAGULATION/BICAP);  Surgeon: Marguerita Merles, Reuel Boom, MD;  Location: [facility];  Service: Gastroenterology;;    Current Outpatient Medications  Medication Sig Dispense Refill   acetaminophen (TYLENOL) 650 MG CR tablet Take 1,300 mg by mouth every 8 (eight) hours as needed (general discomfort).     albuterol (PROVENTIL) (2.5 MG/3ML) 0.083% nebulizer solution Take 3 mLs (2.5 mg total) by nebulization every 4 (four) hours as needed for wheezing or shortness of breath. 75 mL 12   atorvastatin (LIPITOR) 20 MG tablet Take 1 tablet (20 mg total) by mouth at bedtime.     Calcium Carbonate (CALCIUM 500 PO) Take 1 tablet by mouth daily.     Cholecalciferol 25 MCG (1000 UT) capsule Take 5,000 Units by mouth daily.     cloNIDine (CATAPRES) 0.1 MG tablet Take 2 tablets (0.2 mg total) by mouth 2 (two) times daily. 60 tablet 3   dicyclomine (BENTYL) 20 MG tablet Take one every 8 hours for abd cramps 20 tablet 0   memantine (NAMENDA) 10 MG tablet Take 10 mg by mouth 2 (two) times daily.     metoCLOPramide (REGLAN) 5 MG tablet Take 1 tablet (5 mg total) by mouth 3 (three) times daily. 90 tablet 0   Multiple Vitamin (MULTIVITAMIN) tablet Take 1 tablet by mouth daily.     Nutritional Supplements (ENSURE ORIGINAL) LIQD Take by mouth 3 (three) times daily.     ondansetron (ZOFRAN) 4 MG tablet Take 4 mg by mouth every 8 (eight) hours as needed for nausea or vomiting.     pantoprazole (PROTONIX) 40 MG tablet Take 1 tablet (40 mg total) by mouth 2 (two) times daily. 60 tablet 3   polyethylene glycol (MIRALAX / GLYCOLAX) 17 g packet Take 17 g by mouth daily. 14 each 0   sucralfate (CARAFATE) 1 g  tablet Take 1 tablet (1 g total) by mouth 2 (two) times daily. 60 tablet 0   tamsulosin (FLOMAX) 0.4 MG CAPS capsule Take 0.4 mg by mouth daily.     VASCEPA 1 g capsule Take 2 g by mouth 2 (two) times daily.     Venlafaxine HCl 75 MG TB24 Take 75 mg by mouth daily.     No current facility-administered medications for this visit.    Allergies as of 04/08/2023   (No Known Allergies)    Family History  Problem Relation Age of Onset   Hypertension Mother    Stroke Mother    Hypertension Father    Hypertension Sister     Social History   Socioeconomic History   Marital status: Divorced    Spouse name: Not on file   Number of children: Not on file   Years of education: Not on file   Highest education level: Not on file  Occupational History   Not on file  Tobacco Use   Smoking status: Former    Current packs/day: 0.00    Types: Cigarettes    Quit date: 2024    Years since quitting: 0.7   Smokeless tobacco: Not on file  Vaping Use  Vaping status: Never Used  Substance and Sexual Activity   Alcohol use: No   Drug use: No   Sexual activity: Not on file  Other Topics Concern   Not on file  Social History Narrative   Not on file   Social Determinants of Health   Financial Resource Strain: Not on file  Food Insecurity: No Food Insecurity (01/14/2023)   Hunger Vital Sign    Worried About Running Out of Food in the Last Year: Never true    Ran Out of Food in the Last Year: Never true  Transportation Needs: No Transportation Needs (01/14/2023)   PRAPARE - Administrator, Civil Service (Medical): No    Lack of Transportation (Non-Medical): No  Physical Activity: Not on file  Stress: Not on file  Social Connections: Not on file    Review of systems General: negative for malaise, night sweats, fever, chills, weight loss Neck: Negative for lumps, goiter, pain and significant neck swelling Resp: Negative for cough, wheezing, dyspnea at rest CV: Negative for  chest pain, leg swelling, palpitations, orthopnea GI: denies melena, hematochezia, nausea, vomiting, diarrhea, constipation, dysphagia, odyonophagia, early satiety or unintentional weight loss.  MSK: Negative for joint pain or swelling, back pain, and muscle pain. Derm: Negative for itching or rash Psych: Denies depression, anxiety, memory loss, confusion. No homicidal or suicidal ideation.  Heme: Negative for prolonged bleeding, bruising easily, and swollen nodes. Endocrine: Negative for cold or heat intolerance, polyuria, polydipsia and goiter. Neuro: negative for tremor, gait imbalance, syncope and seizures. The remainder of the review of systems is noncontributory.  Physical Exam: BP (!) 152/76 (BP Location: Left Arm, Patient Position: Sitting, Cuff Size: Normal)   Pulse (!) 57   Temp 97.8 F (36.6 C) (Temporal)   Ht 5\' 11"  (1.803 m)   Wt 201 lb (91.2 kg)   BMI 28.03 kg/m  General:   Alert and oriented. No distress noted. Pleasant and cooperative.  Head:  Normocephalic and atraumatic. Eyes:  Conjuctiva clear without scleral icterus. Mouth:  Oral mucosa pink and moist. Good dentition. No lesions. Heart: Normal rate and rhythm, s1 and s2 heart sounds present.  Lungs: Clear lung sounds in all lobes. Respirations equal and unlabored. Abdomen:  +BS, soft, non-tender and non-distended. No rebound or guarding. No HSM or masses noted. Derm: No palmar erythema or jaundice Msk:  Symmetrical without gross deformities. Normal posture. Extremities:  Without edema. Neurologic:  Alert and  oriented x4 Psych:  Alert and cooperative. Normal mood and affect.  Invalid input(s): "6 MONTHS"   ASSESSMENT: Warren Sullivan is a 62 y.o. male presenting today for hospital follow up  Hospital admission July 2024 for coffee-ground emesis, secondary to grade C esophagitis.  Started on PPI twice daily.  Patient denies any GI complaints.  He was seen in the ED on 9/1 for concern of coffee-ground emesis  again however hemoglobin was 14.4 and stool was heme-negative.  Case was discussed with our team who recommended starting Carafate and discharging patient as he was noted to have recently been eating chocolate and darker emesis was suspected possibly secondary to this or known esophagitis.  He is scheduled for EUS in November with Dr. Meridee Score due to lesion in duodenum that was found on recent EGD.  At this time as patient has no complaints and no recent coffee ground appearing emesis with recent hgb and stool negative for blood, I would recommend continuing PPI twice daily, Carafate 1 g twice daily and continuing to  avoid consumption of chocolate.  I did discuss with family member she should make [place] aware if he has any further hematemesis/coffee-ground appearing emesis.  He should keep scheduled EUS in November as well.   PLAN:  Continue carafate 1g BID  2. Continue protonix 40mg  BID  3. Proceed with EUS in November 4. Pt family member to make [place] aware of further hematemesis   All questions were answered, patient verbalized understanding and is in agreement with plan as outlined above.    Follow Up: 3 months   Danile Trier L. Jeanmarie Hubert, MSN, APRN, AGNP-C Adult-Gerontology Nurse Practitioner [facility]

## 2023-04-08 NOTE — Patient Instructions (Signed)
Please continue Protonix 40 mg twice daily Continue Carafate 1 g twice daily until completion Keep scheduled EUS appointment on 11/4 with Dr. Meridee Score Please make [place] aware of any further vomiting of blood or dark coffee-ground looking substance, recent hemoglobin was normal and stool was negative for blood when he was in the ED earlier this month, suspect dark-colored emesis was secondary to consumption of chocolate.  Follow-up 3 months  It was a pleasure to see you today. I want to create trusting relationships with patients and provide genuine, compassionate, and quality care. I truly value your feedback! please be on the lookout for a survey regarding your visit with me today. I appreciate your input about our visit and your time in completing this!    Warren Sullivan L. Jeanmarie Hubert, MSN, APRN, AGNP-C Adult-Gerontology Nurse Practitioner [facility]

## 2023-04-27 ENCOUNTER — Other Ambulatory Visit: Payer: Self-pay

## 2023-04-27 ENCOUNTER — Emergency Department (HOSPITAL_COMMUNITY): Payer: Medicare Other

## 2023-04-27 ENCOUNTER — Emergency Department (HOSPITAL_COMMUNITY)
Admission: EM | Admit: 2023-04-27 | Discharge: 2023-04-28 | Disposition: A | Discharge status: Skilled Nursing Facility | Payer: Medicare Other | Attending: Emergency Medicine | Admitting: Emergency Medicine

## 2023-04-27 ENCOUNTER — Encounter (HOSPITAL_COMMUNITY): Payer: Self-pay

## 2023-04-27 DIAGNOSIS — I10 Essential (primary) hypertension: Secondary | ICD-10-CM | POA: Insufficient documentation

## 2023-04-27 DIAGNOSIS — K209 Esophagitis, unspecified without bleeding: Secondary | ICD-10-CM | POA: Diagnosis not present

## 2023-04-27 DIAGNOSIS — Z87891 Personal history of nicotine dependence: Secondary | ICD-10-CM | POA: Diagnosis not present

## 2023-04-27 DIAGNOSIS — Z8673 Personal history of transient ischemic attack (TIA), and cerebral infarction without residual deficits: Secondary | ICD-10-CM | POA: Insufficient documentation

## 2023-04-27 DIAGNOSIS — F0390 Unspecified dementia without behavioral disturbance: Secondary | ICD-10-CM | POA: Diagnosis not present

## 2023-04-27 DIAGNOSIS — R112 Nausea with vomiting, unspecified: Secondary | ICD-10-CM | POA: Diagnosis present

## 2023-04-27 LAB — COMPREHENSIVE METABOLIC PANEL
ALT: 12 U/L (ref 0–44)
AST: 13 U/L — ABNORMAL LOW (ref 15–41)
Albumin: 4 g/dL (ref 3.5–5.0)
Alkaline Phosphatase: 77 U/L (ref 38–126)
Anion gap: 8 (ref 5–15)
BUN: 11 mg/dL (ref 8–23)
CO2: 29 mmol/L (ref 22–32)
Calcium: 9 mg/dL (ref 8.9–10.3)
Chloride: 101 mmol/L (ref 98–111)
Creatinine, Ser: 0.76 mg/dL (ref 0.61–1.24)
GFR, Estimated: >60 mL/min (ref >60)
Glucose, Bld: 112 mg/dL — ABNORMAL HIGH (ref 70–99)
Potassium: 3.9 mmol/L (ref 3.5–5.1)
Sodium: 138 mmol/L (ref 135–145)
Total Bilirubin: 0.5 mg/dL (ref 0.3–1.2)
Total Protein: 8 g/dL (ref 6.5–8.1)

## 2023-04-27 LAB — CBC WITH DIFFERENTIAL/PLATELET
Abs Immature Granulocytes: 0.02 10*3/uL (ref 0.00–0.07)
Basophils Absolute: 0 10*3/uL (ref 0.0–0.1)
Basophils Relative: 0 %
Eosinophils Absolute: 0.1 10*3/uL (ref 0.0–0.5)
Eosinophils Relative: 1 %
HCT: 42.9 % (ref 39.0–52.0)
Hemoglobin: 14.2 g/dL (ref 13.0–17.0)
Immature Granulocytes: 0 %
Lymphocytes Relative: 21 %
Lymphs Abs: 1.7 10*3/uL (ref 0.7–4.0)
MCH: 28.6 pg (ref 26.0–34.0)
MCHC: 33.1 g/dL (ref 30.0–36.0)
MCV: 86.5 fL (ref 80.0–100.0)
Monocytes Absolute: 0.4 10*3/uL (ref 0.1–1.0)
Monocytes Relative: 5 %
Neutro Abs: 5.7 10*3/uL (ref 1.7–7.7)
Neutrophils Relative %: 73 %
Platelets: 173 10*3/uL (ref 150–400)
RBC: 4.96 MIL/uL (ref 4.22–5.81)
RDW: 14 % (ref 11.5–15.5)
WBC: 7.9 10*3/uL (ref 4.0–10.5)
nRBC: 0 % (ref 0.0–0.2)

## 2023-04-27 LAB — LIPASE, BLOOD: Lipase: 20 U/L (ref 11–51)

## 2023-04-27 LAB — PROTIME-INR
INR: 1.1 (ref 0.8–1.2)
Prothrombin Time: 14.2 s (ref 11.4–15.2)

## 2023-04-27 LAB — AMMONIA: Ammonia: 17 umol/L (ref 9–35)

## 2023-04-27 MED ORDER — DIPHENHYDRAMINE HCL 50 MG/ML IJ SOLN
25.0000 mg | Freq: Once | INTRAMUSCULAR | Status: AC
  Administered 2023-04-27: 25 mg via INTRAVENOUS
  Filled 2023-04-27: qty 1

## 2023-04-27 MED ORDER — SODIUM CHLORIDE 0.9 % IV SOLN
1.0000 g | Freq: Once | INTRAVENOUS | Status: AC
  Administered 2023-04-27: 1 g via INTRAVENOUS
  Filled 2023-04-27: qty 10

## 2023-04-27 MED ORDER — SODIUM CHLORIDE 0.9 % IV BOLUS
1000.0000 mL | Freq: Once | INTRAVENOUS | Status: AC
  Administered 2023-04-27: 1000 mL via INTRAVENOUS

## 2023-04-27 MED ORDER — ONDANSETRON HCL 4 MG/2ML IJ SOLN
4.0000 mg | Freq: Once | INTRAMUSCULAR | Status: DC
  Filled 2023-04-27: qty 2

## 2023-04-27 MED ORDER — PANTOPRAZOLE SODIUM 40 MG IV SOLR: 40.0000 mg | Freq: Two times a day (BID) | INTRAVENOUS | Status: DC

## 2023-04-27 MED ORDER — PROMETHAZINE HCL 12.5 MG PO TABS: 12.5000 mg | ORAL_TABLET | Freq: Three times a day (TID) | ORAL | 0 refills | Status: DC | PRN

## 2023-04-27 MED ORDER — PANTOPRAZOLE SODIUM 40 MG IV SOLR
40.0000 mg | Freq: Once | INTRAVENOUS | Status: AC
  Administered 2023-04-27: 40 mg via INTRAVENOUS
  Filled 2023-04-27: qty 10

## 2023-04-27 MED ORDER — PANTOPRAZOLE SODIUM 40 MG PO TBEC: 40.0000 mg | DELAYED_RELEASE_TABLET | Freq: Two times a day (BID) | ORAL | 3 refills | Status: DC

## 2023-04-27 MED ORDER — PROCHLORPERAZINE EDISYLATE 10 MG/2ML IJ SOLN
5.0000 mg | Freq: Once | INTRAMUSCULAR | Status: AC
  Administered 2023-04-27: 5 mg via INTRAVENOUS
  Filled 2023-04-27: qty 2

## 2023-04-27 MED ORDER — IOHEXOL 300 MG/ML  SOLN
100.0000 mL | Freq: Once | INTRAMUSCULAR | Status: AC | PRN
  Administered 2023-04-27: 100 mL via INTRAVENOUS

## 2023-04-27 MED ORDER — OCTREOTIDE LOAD VIA INFUSION
25.0000 ug | Freq: Once | INTRAVENOUS | Status: AC
  Administered 2023-04-27: 25 ug via INTRAVENOUS
  Filled 2023-04-27: qty 13

## 2023-04-27 MED ORDER — ONDANSETRON HCL 4 MG/2ML IJ SOLN
4.0000 mg | Freq: Once | INTRAMUSCULAR | Status: AC
  Administered 2023-04-27: 4 mg via INTRAVENOUS
  Filled 2023-04-27: qty 2

## 2023-04-27 MED ORDER — QUETIAPINE FUMARATE 25 MG PO TABS
25.0000 mg | ORAL_TABLET | Freq: Every day | ORAL | 0 refills | Status: AC
Start: 2023-04-27 — End: 2023-05-11

## 2023-04-27 MED ORDER — SUCRALFATE 1 G PO TABS: 1.0000 g | ORAL_TABLET | Freq: Three times a day (TID) | ORAL | 0 refills | Status: DC

## 2023-04-27 MED ORDER — SODIUM CHLORIDE 0.9 % IV SOLN
50.0000 ug/h | INTRAVENOUS | Status: DC
  Administered 2023-04-27: 50 ug/h via INTRAVENOUS
  Filled 2023-04-27 (×3): qty 1

## 2023-04-27 NOTE — ED Provider Notes (Incomplete)
[place] EMERGENCY DEPARTMENT AT [facility] Provider Note  CSN: [id number] Arrival date & time: 04/27/23 1128  Chief Complaint(s) Emesis (Pt bib EMS from [facility] for vomiting (coffee ground) . Hx of GI bleed and HTN. Per ems BP of 202/112, BP 168/110 on arrival, states pt has been unable to take meds due to vomiting. No complaints of pain or nausea. HX of dementia.)  HPI Warren Sullivan is a 62 y.o. male with past medical history as below, significant for CVA, dementia, GERD, hemiplegia, hyperlipidemia, hypertension, MDD, prior alcohol abuse, esophagitis who presents to the ED with complaint of emesis.   Patient here with sisters, report he has been having nausea and vomiting over the past 24 to 48 hours.  Unable take medications secondary to nausea and vomiting.  No change to bowel or bladder function reported.  Emesis characterized as coffee-ground colored by family.  He is scheduled for EGD later this month with Dr. Meridee Score.  EGD in July which shows esophagitis.  No longer drinking alcohol patient is a poor historian secondary to dementia, CVA. Many ED visits for the same    Past Medical History Past Medical History:  Diagnosis Date   AKI (acute kidney injury) (HCC) 08/18/2021   CVA (cerebral vascular accident) (HCC)    Dehydration 08/18/2021   Dementia in other diseases classified elsewhere, severe, without behavioral disturbance, psychotic disturbance, mood disturbance, and anxiety (HCC)    Fall at home, initial encounter 08/29/2021   GERD (gastroesophageal reflux disease)    Hemiplegia, unspecified affecting right dominant side (HCC)    Hyperlipidemia    Hypertension    MDD (major depressive disorder)    Pneumonia 08/28/2021   Small bowel obstruction (HCC) 08/17/2021   Stroke (HCC)    Vitamin D deficiency    Patient Active Problem List   Diagnosis Date Noted   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   Upper GI bleed 01/15/2023   Left nephrolithiasis,  NON-OBSTRUCTING 01/11/2023   Cholelithiasis without cholecystitis 01/11/2023   Coffee ground emesis 11/29/2022   Acute respiratory failure with hypoxia and hypercarbia (HCC) 11/25/2022   Sepsis due to undetermined organism (HCC) 11/25/2022   Lobar pneumonia (HCC) 11/25/2022   Palliative care encounter 09/03/2022   COVID-19 virus infection 07/08/2022   Aneurysm of ascending aorta without rupture (HCC) 06/22/2022   Esophagitis 06/09/2022   Hiatal hernia 06/09/2022   Abnormal CT of the chest 06/09/2022   GI bleed 06/09/2022   Hx of completed stroke 06/08/2022   Hemiplegia, unspecified affecting right dominant side (HCC) 06/08/2022   Emesis, persistent 01/29/2022   Helicobacter pylori duodenitis 11/06/2021   Hypokalemia 08/18/2021   Hyponatremia 08/18/2021   Hypocalcemia 08/18/2021   Hyperglycemia 08/18/2021   Vomiting 08/18/2021   GERD (gastroesophageal reflux disease) 08/18/2021   Hyperlipidemia, unspecified 08/18/2021   Dementia without behavioral disturbance (HCC) 08/18/2021   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 10/19/2015   Tobacco dependence 06/21/2015   Essential hypertension 06/14/2015   Home Medication(s) Prior to Admission medications   Medication Sig Start Date End Date Taking? Authorizing Provider  promethazine (PHENERGAN) 12.5 MG tablet Take 1 tablet (12.5 mg total) by mouth every 8 (eight) hours as needed for nausea or vomiting. 04/27/23  Yes Tanda Rockers A, DO  QUEtiapine (SEROQUEL) 25 MG tablet Take 1 tablet (25 mg total) by mouth at bedtime for 14 days. 04/27/23 05/11/23 Yes Tanda Rockers A, DO  sucralfate (CARAFATE) 1 g tablet Take 1 tablet (1 g total) by mouth 4 (four)  times daily -  with meals and at bedtime for 14 days. 04/27/23 05/11/23 Yes Sloan Leiter, DO  acetaminophen (TYLENOL) 650 MG CR tablet Take 1,300 mg by mouth every 8 (eight) hours as needed (general discomfort).    [provider]  albuterol (PROVENTIL) (2.5 MG/3ML) 0.083%  nebulizer solution Take 3 mLs (2.5 mg total) by nebulization every 4 (four) hours as needed for wheezing or shortness of breath. 01/30/22   Johnson, Clanford L, MD  atorvastatin (LIPITOR) 20 MG tablet Take 1 tablet (20 mg total) by mouth at bedtime. 06/11/22   Glade Lloyd, MD  Calcium Carbonate (CALCIUM 500 PO) Take 1 tablet by mouth daily.    [provider]  Cholecalciferol 25 MCG (1000 UT) capsule Take 5,000 Units by mouth daily.    [provider]  cloNIDine (CATAPRES) 0.1 MG tablet Take 2 tablets (0.2 mg total) by mouth 2 (two) times daily. 08/20/21   Shon Hale, MD  dicyclomine (BENTYL) 20 MG tablet Take one every 8 hours for abd cramps 01/04/23   [name], MD  memantine (NAMENDA) 10 MG tablet Take 10 mg by mouth 2 (two) times daily. 07/24/21   [provider]  metoCLOPramide (REGLAN) 5 MG tablet Take 1 tablet (5 mg total) by mouth 3 (three) times daily. 01/16/23   Azucena Fallen, MD  Multiple Vitamin (MULTIVITAMIN) tablet Take 1 tablet by mouth daily.    [provider]  Nutritional Supplements (ENSURE ORIGINAL) LIQD Take by mouth 3 (three) times daily.    [provider]  ondansetron (ZOFRAN) 4 MG tablet Take 4 mg by mouth every 8 (eight) hours as needed for nausea or vomiting. 02/26/22   [provider]  pantoprazole (PROTONIX) 40 MG tablet Take 1 tablet (40 mg total) by mouth 2 (two) times daily. 04/27/23 04/26/24  Sloan Leiter, DO  polyethylene glycol (MIRALAX / GLYCOLAX) 17 g packet Take 17 g by mouth daily. 12/02/22   Vassie Loll, MD  tamsulosin (FLOMAX) 0.4 MG CAPS capsule Take 0.4 mg by mouth daily. 07/11/21   [provider]  VASCEPA 1 g capsule Take 2 g by mouth 2 (two) times daily. 08/08/21   [provider]  Venlafaxine HCl 75 MG TB24 Take 75 mg by mouth daily.    [provider]                                                                                                                                     Past Surgical History Past Surgical History:  Procedure Laterality Date   BIOPSY  08/18/2021   Procedure: BIOPSY;  Surgeon: Dolores Frame, MD;  Location: [facility];  Service: Gastroenterology;;  Bonnielee Haff and distal esophagus biopsies    BIOPSY  10/03/2021   Procedure: BIOPSY;  Surgeon: Dolores Frame, MD;  Location: [facility];  Service: Gastroenterology;;   BIOPSY  01/30/2022   Procedure: BIOPSY;  Surgeon: Marguerita Merles, Reuel Boom, MD;  Location: [facility];  Service: Gastroenterology;;   BIOPSY  01/15/2023   Procedure: BIOPSY;  Surgeon: Dolores Frame, MD;  Location: [facility];  Service: Gastroenterology;;   ESOPHAGOGASTRODUODENOSCOPY (EGD) WITH PROPOFOL N/A 08/18/2021   Surgeon: Marguerita Merles, Reuel Boom, MD; severe acute nonspecific esophagitis with ulceration and reactive hyperplasia, peptic duodenitis, acute chronic gastritis with H. pylori, 3 cm hiatal hernia.   ESOPHAGOGASTRODUODENOSCOPY (EGD) WITH PROPOFOL N/A 10/03/2021   Surgeon: Marguerita Merles, Reuel Boom, MD; myocarditis, chronic gastritis with H. pylori, 2 cm hiatal hernia, erythematous duodenopathy.   ESOPHAGOGASTRODUODENOSCOPY (EGD) WITH PROPOFOL N/A 01/30/2022   Procedure: ESOPHAGOGASTRODUODENOSCOPY (EGD) WITH PROPOFOL;  Surgeon: Dolores Frame, MD;  Location: [facility];  Service: Gastroenterology;  Laterality: N/A;   ESOPHAGOGASTRODUODENOSCOPY (EGD) WITH PROPOFOL N/A 01/15/2023   Procedure: ESOPHAGOGASTRODUODENOSCOPY (EGD) WITH PROPOFOL;  Surgeon: Dolores Frame, MD;  Location: [facility];  Service: Gastroenterology;  Laterality: N/A;   HERNIA REPAIR     HOT HEMOSTASIS  01/15/2023   Procedure: HOT HEMOSTASIS (ARGON PLASMA COAGULATION/BICAP);  Surgeon: Marguerita Merles, Reuel Boom, MD;  Location: [facility];  Service: Gastroenterology;;   Family History Family History  Problem Relation Age of Onset   Hypertension Mother     Stroke Mother    Hypertension Father    Hypertension Sister     Social History Social History   Tobacco Use   Smoking status: Former    Current packs/day: 0.00    Types: Cigarettes    Quit date: 2024    Years since quitting: 0.8  Vaping Use   Vaping status: Never Used  Substance Use Topics   Alcohol use: No   Drug use: No   Allergies Patient has no known allergies.  Review of Systems Review of Systems  Unable to perform ROS: Dementia  Constitutional:  Negative for chills and fever.  Respiratory:  Negative for shortness of breath.   Cardiovascular:  Negative for chest pain.  Gastrointestinal:  Positive for abdominal pain, nausea and vomiting. Negative for blood in stool and diarrhea.  Genitourinary:  Negative for dysuria.    Physical Exam Vital Signs  I have reviewed the triage vital signs BP (!) 163/109 (BP Location: Right Arm)   Pulse 76   Temp 98.2 F (36.8 C) (Oral)   Resp 16   Ht 5\' 11"  (1.803 m)   Wt 91.2 kg   SpO2 96%   BMI 28.04 kg/m  Physical Exam Vitals and nursing note reviewed.  Constitutional:      General: He is not in acute distress.    Appearance: He is well-developed.     Comments: Malodorous   HENT:     Head: Normocephalic and atraumatic.     Right Ear: External ear normal.     Left Ear: External ear normal.     Mouth/Throat:     Mouth: Mucous membranes are dry.  Eyes:     General: No scleral icterus. Cardiovascular:     Rate and Rhythm: Normal rate and regular rhythm.     Pulses: Normal pulses.     Heart sounds: Normal heart sounds.  Pulmonary:     Effort: Pulmonary effort is normal. No respiratory distress.     Breath sounds: Normal breath sounds.  Abdominal:     General: Abdomen is flat.     Palpations: Abdomen is soft.     Tenderness: There is generalized abdominal tenderness.     Comments: Not peritoneal   Musculoskeletal:  Cervical back: No rigidity.     Right lower leg: No edema.     Left lower leg: No edema.   Skin:    General: Skin is warm and dry.     Capillary Refill: Capillary refill takes less than 2 seconds.  Neurological:     Mental Status: He is alert.  Psychiatric:        Mood and Affect: Mood normal.        Behavior: Behavior normal.     ED Results and Treatments Labs (all labs ordered are listed, but only abnormal results are displayed) Labs Reviewed  COMPREHENSIVE METABOLIC PANEL - Abnormal; Notable for the following components:      Result Value   Glucose, Bld 112 (*)    AST 13 (*)    All other components within normal limits  CBC WITH DIFFERENTIAL/PLATELET  LIPASE, BLOOD  AMMONIA  PROTIME-INR                                                                                                                          Radiology CT ABDOMEN PELVIS W CONTRAST  Result Date: 04/27/2023 CLINICAL DATA:  Epigastric pain.  Hematemesis. EXAM: CT ABDOMEN AND PELVIS WITH CONTRAST TECHNIQUE: Multidetector CT imaging of the abdomen and pelvis was performed using the standard protocol following bolus administration of intravenous contrast. RADIATION DOSE REDUCTION: This exam was performed according to the departmental dose-optimization program which includes automated exposure control, adjustment of the mA and/or kV according to patient size and/or use of iterative reconstruction technique. CONTRAST:  OMNIPAQUE IOHEXOL 300 MG/ML  SOLN COMPARISON:  01/04/2023 FINDINGS: Lower Chest: No acute findings. Hepatobiliary: No suspicious hepatic masses identified. Tiny gallstones are seen, however there is no evidence of cholecystitis or biliary dilatation. Pancreas:  No mass or inflammatory changes. Spleen: Within normal limits in size and appearance. Adrenals/Urinary Tract: No suspicious masses identified. 2 mm calculus again seen in upper pole of left kidney. No evidence of ureteral calculi or hydronephrosis. Stomach/Bowel: Moderate hiatal hernia again seen. No evidence of obstruction, inflammatory  process or abnormal fluid collections. Normal appendix visualized. Diverticulosis is seen mainly involving the descending and sigmoid colon, however there is no evidence of diverticulitis. Vascular/Lymphatic: No pathologically enlarged lymph nodes. No acute vascular findings. Reproductive:  No mass or other significant abnormality. Other:  None. Musculoskeletal: No suspicious bone lesions identified. Old mild L1 vertebral body compression fracture remains stable. IMPRESSION: No acute findings. Stable moderate hiatal hernia. Tiny nonobstructing left renal calculus. Cholelithiasis. No radiographic evidence of cholecystitis. Colonic diverticulosis, without radiographic evidence of diverticulitis. Electronically Signed   By: Danae Orleans M.D.   On: 04/27/2023 16:10   CT Head Wo Contrast  Result Date: 04/27/2023 CLINICAL DATA:  Delirium EXAM: CT HEAD WITHOUT CONTRAST TECHNIQUE: Contiguous axial images were obtained from the base of the skull through the vertex without intravenous contrast. RADIATION DOSE REDUCTION: This exam was performed according to the departmental dose-optimization program which includes automated exposure control, adjustment  of the mA and/or kV according to patient size and/or use of iterative reconstruction technique. COMPARISON:  Remote head CT 02/19/2013 FINDINGS: Brain: No evidence of acute infarct, hemorrhage, or mass lesion/mass effect. Generalized atrophy, with progression from 2014. Incidental cavum septum pellucidum. Moderate periventricular and deep white matter hypodensity typical of chronic small vessel ischemia. Remote lacunar infarcts in the bilateral basal ganglia, right greater than left. No subdural or extra-axial collections. Vascular: Atherosclerosis of skullbase vasculature without hyperdense vessel or abnormal calcification. Skull: No fracture or focal lesion. Sinuses/Orbits: Partial opacification of left mastoid air cells. Minor mucosal thickening of scattered ethmoid air  cells. Other: None. IMPRESSION: 1. No acute intracranial abnormality. 2. Generalized atrophy, with progression from 2014. Moderate chronic small vessel ischemia. Remote lacunar infarcts in the bilateral basal ganglia. Electronically Signed   By: Narda Rutherford M.D.   On: 04/27/2023 15:56    Pertinent labs & imaging results that were available during my care of the patient were reviewed by me and considered in my medical decision making (see MDM for details).  Medications Ordered in ED Medications  octreotide (SANDOSTATIN) 2 mcg/mL load via infusion 25 mcg (25 mcg Intravenous Bolus from Bag 04/27/23 1629)    And  octreotide (SANDOSTATIN) 500 mcg in sodium chloride 0.9 % 250 mL (2 mcg/mL) infusion (0 mcg/hr Intravenous Stopped 04/27/23 1941)  pantoprazole (PROTONIX) injection 40 mg (40 mg Intravenous Given 04/27/23 1506)    Followed by  pantoprazole (PROTONIX) injection 40 mg (40 mg Intravenous Not Given 04/27/23 2136)  ondansetron (ZOFRAN) injection 4 mg (4 mg Intravenous Given 04/27/23 1251)  pantoprazole (PROTONIX) injection 40 mg (40 mg Intravenous Given 04/27/23 1251)  cefTRIAXone (ROCEPHIN) 1 g in sodium chloride 0.9 % 100 mL IVPB (0 g Intravenous Stopped 04/27/23 1633)  sodium chloride 0.9 % bolus 1,000 mL (0 mLs Intravenous Stopped 04/27/23 1634)  iohexol (OMNIPAQUE) 300 MG/ML solution 100 mL (100 mLs Intravenous Contrast Given 04/27/23 1428)  prochlorperazine (COMPAZINE) injection 5 mg (5 mg Intravenous Given 04/27/23 1515)  diphenhydrAMINE (BENADRYL) injection 25 mg (25 mg Intravenous Given 04/27/23 1513)                                                                                                                                     Procedures Procedures  (including critical care time)  Medical Decision Making / ED Course    Medical Decision Making:    KHAMARI TERNES is a 62 y.o. male with past medical history as below, significant for CVA, dementia, GERD, hemiplegia,  hyperlipidemia, hypertension, MDD, prior alcohol abuse, esophagitis who presents to the ED with complaint of emesis. . The complaint involves an extensive differential diagnosis and also carries with it a high risk of complications and morbidity.  Serious etiology was considered. Ddx includes but is not limited to: Differential diagnosis includes but is not exclusive to acute cholecystitis, intrathoracic causes for epigastric abdominal pain, gastritis, duodenitis, pancreatitis, small  bowel or large bowel obstruction, abdominal aortic aneurysm, hernia, gastritis, esophagitis, PUD, less likely variceal, etc.   Complete initial physical exam performed, notably the patient  was no acute distress, hypertensive, tachycardia, no hypoxia.    Reviewed and confirmed nursing documentation for past medical history, family history, social history.  Vital signs reviewed.    Clinical Course as of 04/28/23 1610  Sat Apr 27, 2023  1514 Spoke with Dr Levon Hedger, familiar with patient in the past, okay to stay here; they will see in the AM [SG]  1613 CT stable [SG]    Clinical Course User Index [SG] Sloan Leiter, DO     Labs reviewed and are stable, hemoglobin stable, BUN stable.  He is hemodynamically stable.  He had 2 episodes of emesis in the ER which may be coffee-ground in color per nursing  CT head stable, CT abdomen pelvis stable  Spoke w/ GI, no need for rpt scope; plan for eval by Dr Meridee Score at [facility] next month  He has known esophagitis, no varices on recent endoscopy  Labs are stable, he is feeling better.  Given improvement of symptoms, does not appear to necessitate admission to the hospital.  Will adjust home medications.  Encourage close follow-up with gastroenterology for an upcoming endoscopy.  Stable for discharge back to SNF  The patient improved significantly and was discharged in stable condition. Detailed discussions were had with the patient regarding current findings, and need for  close f/u with PCP or on call doctor. The patient has been instructed to return immediately if the symptoms worsen in any way for re-evaluation. Patient verbalized understanding and is in agreement with current care plan. All questions answered prior to discharge.               Additional history obtained: -Additional history obtained from family -External records from outside source obtained and reviewed including: Chart review including previous notes, labs, imaging, consultation notes including  Prior ed visits, home meds, prior labs   Lab Tests: -I ordered, reviewed, and interpreted labs.   The pertinent results include:   Labs Reviewed  COMPREHENSIVE METABOLIC PANEL - Abnormal; Notable for the following components:      Result Value   Glucose, Bld 112 (*)    AST 13 (*)    All other components within normal limits  CBC WITH DIFFERENTIAL/PLATELET  LIPASE, BLOOD  AMMONIA  PROTIME-INR    Notable for labs stable  EKG   EKG Interpretation Date/Time:  Saturday April 27 2023 11:41:46 EDT Ventricular Rate:  95 PR Interval:  154 QRS Duration:  94 QT Interval:  373 QTC Calculation: 469 R Axis:   50  Text Interpretation: Sinus rhythm Nonspecific T abnrm, anterolateral leads Confirmed by Tanda Rockers ([id number]) on 04/27/2023 3:25:54 PM         Imaging Studies ordered: I ordered imaging studies including CTAP I independently visualized the following imaging with scope of interpretation limited to determining acute life threatening conditions related to emergency care; findings noted above I independently visualized and interpreted imaging. I agree with the radiologist interpretation   Medicines ordered and prescription drug management: Meds ordered this encounter  Medications   ondansetron (ZOFRAN) injection 4 mg   pantoprazole (PROTONIX) injection 40 mg   cefTRIAXone (ROCEPHIN) 1 g in sodium chloride 0.9 % 100 mL IVPB    Order Specific Question:   Antibiotic  Indication:    Answer:   Intra-abdominal   sodium chloride 0.9 % bolus 1,000 mL  iohexol (OMNIPAQUE) 300 MG/ML solution 100 mL   DISCONTD: ondansetron (ZOFRAN) injection 4 mg   AND Linked Order Group    octreotide (SANDOSTATIN) 2 mcg/mL load via infusion 25 mcg    octreotide (SANDOSTATIN) 500 mcg in sodium chloride 0.9 % 250 mL (2 mcg/mL) infusion   FOLLOWED BY Linked Order Group    pantoprazole (PROTONIX) injection 40 mg    pantoprazole (PROTONIX) injection 40 mg   prochlorperazine (COMPAZINE) injection 5 mg   diphenhydrAMINE (BENADRYL) injection 25 mg   sucralfate (CARAFATE) 1 g tablet    Sig: Take 1 tablet (1 g total) by mouth 4 (four) times daily -  with meals and at bedtime for 14 days.    Dispense:  56 tablet    Refill:  0   pantoprazole (PROTONIX) 40 MG tablet    Sig: Take 1 tablet (40 mg total) by mouth 2 (two) times daily.    Dispense:  60 tablet    Refill:  3   promethazine (PHENERGAN) 12.5 MG tablet    Sig: Take 1 tablet (12.5 mg total) by mouth every 8 (eight) hours as needed for nausea or vomiting.    Dispense:  15 tablet    Refill:  0   QUEtiapine (SEROQUEL) 25 MG tablet    Sig: Take 1 tablet (25 mg total) by mouth at bedtime for 14 days.    Dispense:  14 tablet    Refill:  0    -I have reviewed the patients home medicines and have made adjustments as needed   Consultations Obtained: I requested consultation with the GI,  and discussed lab and imaging findings as well as pertinent plan - they recommend: no change   Cardiac Monitoring: The patient was maintained on a cardiac monitor.  I personally viewed and interpreted the cardiac monitored which showed an underlying rhythm of: NSR  Social Determinants of Health:  Diagnosis or treatment significantly limited by social determinants of health: former smoker, SNF, prior ETOH abuse   Reevaluation: After the interventions noted above, I reevaluated the patient and found that they have improved  Co morbidities  that complicate the patient evaluation  Past Medical History:  Diagnosis Date   AKI (acute kidney injury) (HCC) 08/18/2021   CVA (cerebral vascular accident) (HCC)    Dehydration 08/18/2021   Dementia in other diseases classified elsewhere, severe, without behavioral disturbance, psychotic disturbance, mood disturbance, and anxiety (HCC)    Fall at home, initial encounter 08/29/2021   GERD (gastroesophageal reflux disease)    Hemiplegia, unspecified affecting right dominant side (HCC)    Hyperlipidemia    Hypertension    MDD (major depressive disorder)    Pneumonia 08/28/2021   Small bowel obstruction (HCC) 08/17/2021   Stroke (HCC)    Vitamin D deficiency       Dispostion: Disposition decision including need for hospitalization was considered, and patient discharged from emergency department.    Final Clinical Impression(s) / ED Diagnoses Final diagnoses:  Dementia, unspecified dementia severity, unspecified dementia type, unspecified whether behavioral, psychotic, or mood disturbance or anxiety (HCC)  Esophagitis  Nausea and vomiting, unspecified vomiting type        Sloan Leiter, DO 04/28/23 0736    Sloan Leiter, DO 04/28/23 [phone]

## 2023-04-27 NOTE — ED Provider Notes (Deleted)
Pt is to be admitted -discussed with hospitalist who will admit Dr. Rolene Arbour, Arlys John, MD 04/27/23 [phone]

## 2023-04-27 NOTE — ED Triage Notes (Signed)
Pt bib EMS from [facility] for vomiting (coffee ground) . Hx of GI bleed and HTN. Per ems BP of 202/112, BP 168/110 on arrival, states pt has been unable to take meds due to vomiting. No complaints of pain or nausea.

## 2023-04-27 NOTE — Discharge Instructions (Addendum)
Please follow with Dr. Meridee Score for your endoscopy  Please note medication adjustments from today  It was a pleasure caring for you today in the emergency department.  Please return to the emergency department for any worsening or worrisome symptoms.

## 2023-04-29 ENCOUNTER — Emergency Department (HOSPITAL_COMMUNITY)
Admission: EM | Admit: 2023-04-29 | Discharge: 2023-04-29 | Disposition: A | Discharge status: Skilled Nursing Facility | Payer: Medicare Other | Attending: Emergency Medicine | Admitting: Emergency Medicine

## 2023-04-29 ENCOUNTER — Emergency Department (HOSPITAL_COMMUNITY): Payer: Medicare Other

## 2023-04-29 ENCOUNTER — Other Ambulatory Visit: Payer: Self-pay

## 2023-04-29 ENCOUNTER — Encounter (HOSPITAL_COMMUNITY): Payer: Self-pay

## 2023-04-29 ENCOUNTER — Telehealth: Payer: Self-pay | Admitting: Gastroenterology

## 2023-04-29 DIAGNOSIS — K920 Hematemesis: Secondary | ICD-10-CM | POA: Diagnosis not present

## 2023-04-29 DIAGNOSIS — R1110 Vomiting, unspecified: Secondary | ICD-10-CM | POA: Insufficient documentation

## 2023-04-29 DIAGNOSIS — F0390 Unspecified dementia without behavioral disturbance: Secondary | ICD-10-CM | POA: Diagnosis not present

## 2023-04-29 DIAGNOSIS — J9811 Atelectasis: Secondary | ICD-10-CM | POA: Diagnosis not present

## 2023-04-29 LAB — CBC WITH DIFFERENTIAL/PLATELET
Abs Immature Granulocytes: 0.03 10*3/uL (ref 0.00–0.07)
Basophils Absolute: 0 10*3/uL (ref 0.0–0.1)
Basophils Relative: 0 %
Eosinophils Absolute: 0 10*3/uL (ref 0.0–0.5)
Eosinophils Relative: 0 %
HCT: 43 % (ref 39.0–52.0)
Hemoglobin: 14.6 g/dL (ref 13.0–17.0)
Immature Granulocytes: 0 %
Lymphocytes Relative: 10 %
Lymphs Abs: 0.9 10*3/uL (ref 0.7–4.0)
MCH: 29.2 pg (ref 26.0–34.0)
MCHC: 34 g/dL (ref 30.0–36.0)
MCV: 86 fL (ref 80.0–100.0)
Monocytes Absolute: 0.3 10*3/uL (ref 0.1–1.0)
Monocytes Relative: 4 %
Neutro Abs: 7.3 10*3/uL (ref 1.7–7.7)
Neutrophils Relative %: 86 %
Platelets: 178 10*3/uL (ref 150–400)
RBC: 5 MIL/uL (ref 4.22–5.81)
RDW: 13.8 % (ref 11.5–15.5)
WBC: 8.5 10*3/uL (ref 4.0–10.5)
nRBC: 0 % (ref 0.0–0.2)

## 2023-04-29 LAB — COMPREHENSIVE METABOLIC PANEL
ALT: 13 U/L (ref 0–44)
AST: 13 U/L — ABNORMAL LOW (ref 15–41)
Albumin: 3.9 g/dL (ref 3.5–5.0)
Alkaline Phosphatase: 77 U/L (ref 38–126)
Anion gap: 14 (ref 5–15)
BUN: 23 mg/dL (ref 8–23)
CO2: 23 mmol/L (ref 22–32)
Calcium: 8.8 mg/dL — ABNORMAL LOW (ref 8.9–10.3)
Chloride: 100 mmol/L (ref 98–111)
Creatinine, Ser: 1.08 mg/dL (ref 0.61–1.24)
GFR, Estimated: >60 mL/min (ref >60)
Glucose, Bld: 144 mg/dL — ABNORMAL HIGH (ref 70–99)
Potassium: 3.5 mmol/L (ref 3.5–5.1)
Sodium: 137 mmol/L (ref 135–145)
Total Bilirubin: 1.3 mg/dL — ABNORMAL HIGH (ref 0.3–1.2)
Total Protein: 8 g/dL (ref 6.5–8.1)

## 2023-04-29 LAB — PROTIME-INR
INR: 1.1 (ref 0.8–1.2)
Prothrombin Time: 14.6 s (ref 11.4–15.2)

## 2023-04-29 LAB — LIPASE, BLOOD: Lipase: 19 U/L (ref 11–51)

## 2023-04-29 MED ORDER — ONDANSETRON HCL 4 MG/2ML IJ SOLN
4.0000 mg | Freq: Once | INTRAMUSCULAR | Status: AC
  Administered 2023-04-29: 4 mg via INTRAVENOUS
  Filled 2023-04-29: qty 2

## 2023-04-29 MED ORDER — SODIUM CHLORIDE 0.9 % IV BOLUS
500.0000 mL | Freq: Once | INTRAVENOUS | Status: AC
  Administered 2023-04-29: 500 mL via INTRAVENOUS

## 2023-04-29 MED ORDER — CLONIDINE HCL 0.2 MG PO TABS
0.2000 mg | ORAL_TABLET | Freq: Once | ORAL | Status: AC
Start: 2023-04-29 — End: 2023-04-29
  Administered 2023-04-29: 0.2 mg via ORAL
  Filled 2023-04-29: qty 1

## 2023-04-29 NOTE — ED Notes (Signed)
Pt given BP meds with some water. Pt vomited. EDP notified.

## 2023-04-29 NOTE — ED Provider Notes (Signed)
[place] EMERGENCY DEPARTMENT AT [facility] Provider Note   CSN: [id number] Arrival date & time: 04/29/23  0631     History  Chief Complaint  Patient presents with   Emesis    Warren Sullivan is a 62 y.o. male.  HPI 62 year old male presents with vomiting.  History is limited at this time as he is the only one in the room and has a significant history of dementia.  He was recently in the ER on 10/19 for vomiting.  He has been in and out of the ER/hospital for vomiting in the past few months.  Right now the patient has no acute complaints and denies headache, chest pain, abdominal pain or nausea.  Home Medications Prior to Admission medications   Medication Sig Start Date End Date Taking? Authorizing Provider  acetaminophen (TYLENOL) 650 MG CR tablet Take 1,300 mg by mouth every 8 (eight) hours as needed (general discomfort).    [provider]  albuterol (PROVENTIL) (2.5 MG/3ML) 0.083% nebulizer solution Take 3 mLs (2.5 mg total) by nebulization every 4 (four) hours as needed for wheezing or shortness of breath. 01/30/22   Johnson, Clanford L, MD  atorvastatin (LIPITOR) 20 MG tablet Take 1 tablet (20 mg total) by mouth at bedtime. 06/11/22   Glade Lloyd, MD  Calcium Carbonate (CALCIUM 500 PO) Take 1 tablet by mouth daily.    [provider]  Cholecalciferol 25 MCG (1000 UT) capsule Take 5,000 Units by mouth daily.    [provider]  cloNIDine (CATAPRES) 0.1 MG tablet Take 2 tablets (0.2 mg total) by mouth 2 (two) times daily. 08/20/21   Shon Hale, MD  dicyclomine (BENTYL) 20 MG tablet Take one every 8 hours for abd cramps 01/04/23   [name], MD  memantine (NAMENDA) 10 MG tablet Take 10 mg by mouth 2 (two) times daily. 07/24/21   [provider]  metoCLOPramide (REGLAN) 5 MG tablet Take 1 tablet (5 mg total) by mouth 3 (three) times daily. 01/16/23   Azucena Fallen, MD  Multiple Vitamin (MULTIVITAMIN) tablet Take 1  tablet by mouth daily.    [provider]  Nutritional Supplements (ENSURE ORIGINAL) LIQD Take by mouth 3 (three) times daily.    [provider]  ondansetron (ZOFRAN) 4 MG tablet Take 4 mg by mouth every 8 (eight) hours as needed for nausea or vomiting. 02/26/22   [provider]  pantoprazole (PROTONIX) 40 MG tablet Take 1 tablet (40 mg total) by mouth 2 (two) times daily. 04/27/23 04/26/24  Sloan Leiter, DO  polyethylene glycol (MIRALAX / GLYCOLAX) 17 g packet Take 17 g by mouth daily. 12/02/22   Vassie Loll, MD  promethazine (PHENERGAN) 12.5 MG tablet Take 1 tablet (12.5 mg total) by mouth every 8 (eight) hours as needed for nausea or vomiting. 04/27/23   Sloan Leiter, DO  QUEtiapine (SEROQUEL) 25 MG tablet Take 1 tablet (25 mg total) by mouth at bedtime for 14 days. 04/27/23 05/11/23  Sloan Leiter, DO  sucralfate (CARAFATE) 1 g tablet Take 1 tablet (1 g total) by mouth 4 (four) times daily -  with meals and at bedtime for 14 days. 04/27/23 05/11/23  Sloan Leiter, DO  tamsulosin (FLOMAX) 0.4 MG CAPS capsule Take 0.4 mg by mouth daily. 07/11/21   [provider]  VASCEPA 1 g capsule Take 2 g by mouth 2 (two) times daily. 08/08/21   [provider]  Venlafaxine HCl 75 MG TB24  Take 75 mg by mouth daily.    [provider]      Allergies    Patient has no known allergies.    Review of Systems   Review of Systems  Unable to perform ROS: Dementia    Physical Exam Updated Vital Signs BP (!) 186/103   Pulse 86   Temp 98 F (36.7 C) (Axillary)   Resp 18   Ht 5\' 11"  (1.803 m)   Wt 91.2 kg   SpO2 91%   BMI 28.03 kg/m  Physical Exam Vitals and nursing note reviewed.  Constitutional:      Appearance: He is well-developed.     Comments: Emesis on shirt, no obvious blood  HENT:     Head: Normocephalic and atraumatic.  Cardiovascular:     Rate and Rhythm: Normal rate and regular rhythm.     Heart sounds: Normal heart sounds.   Pulmonary:     Effort: Pulmonary effort is normal.     Breath sounds: Normal breath sounds.  Abdominal:     General: There is no distension.     Palpations: Abdomen is soft.     Tenderness: There is no abdominal tenderness.  Musculoskeletal:     Cervical back: No rigidity.  Skin:    General: Skin is warm and dry.  Neurological:     Mental Status: He is alert.     ED Results / Procedures / Treatments   Labs (all labs ordered are listed, but only abnormal results are displayed) Labs Reviewed  COMPREHENSIVE METABOLIC PANEL - Abnormal; Notable for the following components:      Result Value   Glucose, Bld 144 (*)    Calcium 8.8 (*)    AST 13 (*)    Total Bilirubin 1.3 (*)    All other components within normal limits  CBC WITH DIFFERENTIAL/PLATELET  LIPASE, BLOOD  PROTIME-INR    EKG EKG Interpretation Date/Time:  Monday April 29 2023 07:27:33 EDT Ventricular Rate:  91 PR Interval:  159 QRS Duration:  94 QT Interval:  353 QTC Calculation: 435 R Axis:   29  Text Interpretation: Sinus rhythm diffuse T wave changes similar to July 2024 Confirmed by Pricilla Loveless [phone]) on 04/29/2023 7:43:05 AM  Radiology DG Abdomen Acute W/Chest  Result Date: 04/29/2023 CLINICAL DATA:  Vomiting. EXAM: DG ABDOMEN ACUTE WITH 1 VIEW CHEST COMPARISON:  None Available. FINDINGS: The bowel gas pattern is non-obstructive. Physiological stool burden. No evidence of pneumoperitoneum. Redemonstration of left basilar opacity obscuring the left hemidiaphragm and blunting the left lateral costophrenic angle. No significant interval change since several prior studies. Bilateral lung fields are otherwise clear. No acute consolidation or lung collapse. Right lateral costophrenic angle is clear. Stable cardio-mediastinal silhouette. No acute osseous abnormalities. Extensive aorto bi-iliac vascular calcifications noted. The soft tissues are within normal limits. Surgical changes, devices, tubes and lines:  None. IMPRESSION: 1. Nonobstructive bowel gas pattern. No evidence of pneumoperitoneum. 2. Stable left basilar opacity, likely chronic pleural fluid/pleural thickening and associated atelectasis. Electronically Signed   By: Jules Schick M.D.   On: 04/29/2023 09:33   CT ABDOMEN PELVIS W CONTRAST  Result Date: 04/27/2023 CLINICAL DATA:  Epigastric pain.  Hematemesis. EXAM: CT ABDOMEN AND PELVIS WITH CONTRAST TECHNIQUE: Multidetector CT imaging of the abdomen and pelvis was performed using the standard protocol following bolus administration of intravenous contrast. RADIATION DOSE REDUCTION: This exam was performed according to the departmental dose-optimization program which includes automated exposure control, adjustment of the mA and/or kV  according to patient size and/or use of iterative reconstruction technique. CONTRAST:  OMNIPAQUE IOHEXOL 300 MG/ML  SOLN COMPARISON:  01/04/2023 FINDINGS: Lower Chest: No acute findings. Hepatobiliary: No suspicious hepatic masses identified. Tiny gallstones are seen, however there is no evidence of cholecystitis or biliary dilatation. Pancreas:  No mass or inflammatory changes. Spleen: Within normal limits in size and appearance. Adrenals/Urinary Tract: No suspicious masses identified. 2 mm calculus again seen in upper pole of left kidney. No evidence of ureteral calculi or hydronephrosis. Stomach/Bowel: Moderate hiatal hernia again seen. No evidence of obstruction, inflammatory process or abnormal fluid collections. Normal appendix visualized. Diverticulosis is seen mainly involving the descending and sigmoid colon, however there is no evidence of diverticulitis. Vascular/Lymphatic: No pathologically enlarged lymph nodes. No acute vascular findings. Reproductive:  No mass or other significant abnormality. Other:  None. Musculoskeletal: No suspicious bone lesions identified. Old mild L1 vertebral body compression fracture remains stable. IMPRESSION: No acute  findings. Stable moderate hiatal hernia. Tiny nonobstructing left renal calculus. Cholelithiasis. No radiographic evidence of cholecystitis. Colonic diverticulosis, without radiographic evidence of diverticulitis. Electronically Signed   By: Danae Orleans M.D.   On: 04/27/2023 16:10   CT Head Wo Contrast  Result Date: 04/27/2023 CLINICAL DATA:  Delirium EXAM: CT HEAD WITHOUT CONTRAST TECHNIQUE: Contiguous axial images were obtained from the base of the skull through the vertex without intravenous contrast. RADIATION DOSE REDUCTION: This exam was performed according to the departmental dose-optimization program which includes automated exposure control, adjustment of the mA and/or kV according to patient size and/or use of iterative reconstruction technique. COMPARISON:  Remote head CT 02/19/2013 FINDINGS: Brain: No evidence of acute infarct, hemorrhage, or mass lesion/mass effect. Generalized atrophy, with progression from 2014. Incidental cavum septum pellucidum. Moderate periventricular and deep white matter hypodensity typical of chronic small vessel ischemia. Remote lacunar infarcts in the bilateral basal ganglia, right greater than left. No subdural or extra-axial collections. Vascular: Atherosclerosis of skullbase vasculature without hyperdense vessel or abnormal calcification. Skull: No fracture or focal lesion. Sinuses/Orbits: Partial opacification of left mastoid air cells. Minor mucosal thickening of scattered ethmoid air cells. Other: None. IMPRESSION: 1. No acute intracranial abnormality. 2. Generalized atrophy, with progression from 2014. Moderate chronic small vessel ischemia. Remote lacunar infarcts in the bilateral basal ganglia. Electronically Signed   By: Narda Rutherford M.D.   On: 04/27/2023 15:56    Procedures Procedures    Medications Ordered in ED Medications  sodium chloride 0.9 % bolus 500 mL (500 mLs Intravenous Bolus 04/29/23 0737)  ondansetron (ZOFRAN) injection 4 mg (4 mg  Intravenous Given 04/29/23 0739)    ED Course/ Medical Decision Making/ A&P                                 Medical Decision Making Amount and/or Complexity of Data Reviewed Labs: ordered.    Details: Unremarkable labs compared to baseline. Radiology: ordered and independent interpretation performed.    Details: No bowel obstruction ECG/medicine tests: ordered and independent interpretation performed.    Details: No change from baseline  Risk Prescription drug management.   Patient has not had any vomiting in the multiple hours has been here.  He was given Zofran and able to tolerate oral fluids.  Based on his MAR review, he was given Zofran once in the last couple days, likely not enough to help with his chronic nausea and vomiting.  Discussed with his sister at the bedside, who  is frustrated that his chronic nausea and vomiting which has been ongoing for a couple years.  He is due to have an EUS but this is more from a lesion standpoint rather than the cause of the vomiting.  Discussed with Lewie Loron, NP for gastroenterology, no other indication for acute workup or admission and she recommends scheduling the Zofran as an outpatient at his facility and she will arrange for follow-up this week in the gastroenterology office.  Patient will be discharged back to his facility with return precautions.  He is hypertensive here but seems to have chronic hypertension and this seems to be unchanged.        Final Clinical Impression(s) / ED Diagnoses Final diagnoses:  Vomiting in adult    Rx / DC Orders ED Discharge Orders     None         Pricilla Loveless, MD 04/29/23 1134

## 2023-04-29 NOTE — Telephone Encounter (Signed)
Patient presented to the ED again today with vomiting. He is on scheduled Reglan but prn Zofran. Discussed with ED physician. Abdominal xray unrevealing. Labs reviewed.He has upcoming EUS for known duodenal nodule.  Recommended continue schedule Reglan and add scheduled Zofran BID to TID before meals.    Mandy/Mitzie: patient last saw Chelsea (a Dr C patient). I do not see any openings whatsoever for her this week. In that case, needs to be seen THIS week by another APP, please put with anyone who has the most open schedule with need for patients. We are trying to avoid admission, and he has had several ED visits. I don't see any follow-up slots for Dr. Levon Hedger either, so needs to be an available APP. Thanks!

## 2023-04-29 NOTE — ED Triage Notes (Signed)
Pt from [facility] via [facility] cc emesis since last night; recently d/c from hospital for same. Hx of htn.

## 2023-04-29 NOTE — ED Notes (Signed)
[facility] called to set up to transfer pt back to facility.

## 2023-04-29 NOTE — ED Notes (Signed)
Pt given ginger ale for PO challenge. Pt able to hold it down. EDP notified.

## 2023-04-29 NOTE — ED Notes (Signed)
Patient transported to X-ray 

## 2023-04-29 NOTE — ED Notes (Signed)
Pt given sprite and able to pass PO challenge at this time.

## 2023-04-29 NOTE — ED Notes (Signed)
Pt given a couple sips of ginger ale at this time.

## 2023-04-29 NOTE — Discharge Instructions (Addendum)
Please schedule the ondansetron to be given 20 minutes before each meal.  If he develops vomiting that is not controlled with his medicine blood in the emesis, fever, abdominal pain, or any other new/concerning symptoms then return to the ER or call 911.

## 2023-05-02 ENCOUNTER — Ambulatory Visit (INDEPENDENT_AMBULATORY_CARE_PROVIDER_SITE_OTHER): Payer: Medicare Other | Admitting: Gastroenterology

## 2023-05-03 NOTE — Progress Notes (Addendum)
Anesthesia Review:  PCP: Cardiologist : Chest x-ray : 01/14/23- 1 view  EKG : 04/29/23  Echo : 11/26/2022  Stress test: Cardiac Cath :  Activity level:  Sleep Study/ CPAP : Fasting Blood Sugar :      / Checks Blood Sugar -- times a day:   Blood Thinner/ Instructions /Last Dose: ASA / Instructions/ Last Dose :    04/29/23- IN ED for vomiting  04/29/23- cbc/diff and cmp    PT in wheelchair    02/04/23- EGD  Resides at [facility] in [place]    Has Dementia  Hemiplegia- right CVA    POA- Derrill Kay [phone] [facility] to fax POA papers   Spokew it hnurse, Misty Stanley at [facility] on 05/03/23.  She is to fax over POA papers, demographic sheet, current  MAR and med hx.   [facility] fax number- [phone]  Phone number- [phone]

## 2023-05-06 ENCOUNTER — Encounter (HOSPITAL_COMMUNITY): Payer: Self-pay | Admitting: Gastroenterology

## 2023-05-07 ENCOUNTER — Encounter (HOSPITAL_COMMUNITY): Payer: Self-pay | Admitting: Gastroenterology

## 2023-05-09 NOTE — Progress Notes (Signed)
Preop instructions for: Warren Sullivan    Date of Birth:  [birth year] Date of Procedure:  05/13/23 Procedure:   Endoscopic Ultrasound Surgeon: Dr.Mansouraty Facility contact:   [facility]   Phone:   [phone] RN contact name/phone#: [phone]               and Fax #: [phone]   Transportation contact phone#: Facility  Time to arrive at [facility]: 0630 am   Report to: Admitting - Go through main entrance of hospital and tell front desk you are having a procedure done, and they will show how to get to admitting dept.   Do not eat solid food past midnight the day before your procedure, you may drink clear liquids until a certain time the morning of the procedure.(To include any tube feedings-must be discontinued)  May have the following until 0400 am  day of procedure  CLEAR LIQUID DIET Water Black Coffee (sugar ok, NO MILK/CREAM OR CREAMERS)  Tea (sugar ok, NO MILK/CREAM OR CREAMERS) regular and decaf                             Plain Jell-O (NO RED)                                           Fruit ices (not with fruit pulp, NO RED)                                     Popsicles (NO RED)                                                                  Juice: apple, WHITE grape, WHITE cranberry Sports drinks like Gatorade (NO RED)   Take these morning medications only with sips of water.(or give through gastrostomy or feeding tube)  Take- Atorvastatin, Flomax, Effexor, Clonidine, Namenda, Protonix, Vascepa, Carafate  Do not take- vitamins/supplements  *Refrain from smoking tobacco after midnight prior to procedure  Note: No Insulin or Diabetic meds should be given or taken the morning of the procedure!   Please send day of procedure: current med list and meds last taken that day, confirm nothing by mouth status from what time, Patient Demographic info( to include DNR status, problem list, allergies), Most recent H&P, labs, scans/studies in the last  year.  Bring Insurance card and ID, can shower & use deodorant but nothing else on skin. Leave all jewelry and other valuables at place where living ( no metal or rings to be worn)  Any questions prior to procedure call pre surg nurse Lyla Son [phone] Any questions DAY OF procedure, call Endoscopy Dept. [phone] Sent from :[facility] Pres[facility]     Phone:[phone] Fax:[phone]

## 2023-05-12 NOTE — Anesthesia Preprocedure Evaluation (Addendum)
Anesthesia Evaluation  Patient identified by MRN, date of birth, ID band Patient awake    Reviewed: Allergy & Precautions, NPO status , Patient's Chart, lab work & pertinent test results  Airway Mallampati: II  TM Distance: >3 FB Neck ROM: Full    Dental no notable dental hx. (+) Dental Advisory Given, Edentulous Upper, Edentulous Lower   Pulmonary former smoker   Pulmonary exam normal breath sounds clear to auscultation       Cardiovascular hypertension, Normal cardiovascular exam Rhythm:Regular Rate:Normal     Neuro/Psych       Dementia  Neuromuscular disease (hemiplegia) CVA    GI/Hepatic hiatal hernia,GERD  ,,(+)     substance abuse  alcohol use  Endo/Other    Renal/GU      Musculoskeletal   Abdominal   Peds  Hematology   Anesthesia Other Findings   Reproductive/Obstetrics                             Anesthesia Physical Anesthesia Plan  ASA: 3  Anesthesia Plan: MAC   Post-op Pain Management:    Induction:   PONV Risk Score and Plan: Propofol infusion and Treatment may vary due to age or medical condition  Airway Management Planned: Natural Airway and Nasal Cannula  Additional Equipment: None  Intra-op Plan:   Post-operative Plan:   Informed Consent: I have reviewed the patients History and Physical, chart, labs and discussed the procedure including the risks, benefits and alternatives for the proposed anesthesia with the patient or authorized representative who has indicated his/her understanding and acceptance.     Dental advisory given  Plan Discussed with: CRNA and Anesthesiologist  Anesthesia Plan Comments:        Anesthesia Quick Evaluation

## 2023-05-13 ENCOUNTER — Ambulatory Visit (HOSPITAL_COMMUNITY): Payer: Self-pay | Admitting: Anesthesiology

## 2023-05-13 ENCOUNTER — Encounter (HOSPITAL_COMMUNITY)
Admission: RE | Disposition: A | Discharge status: Home / Self Care | Payer: Self-pay | Source: Home / Self Care | Attending: Gastroenterology

## 2023-05-13 ENCOUNTER — Other Ambulatory Visit: Payer: Self-pay

## 2023-05-13 ENCOUNTER — Encounter (HOSPITAL_COMMUNITY): Payer: Self-pay | Admitting: Gastroenterology

## 2023-05-13 ENCOUNTER — Ambulatory Visit (HOSPITAL_COMMUNITY)
Admission: RE | Admit: 2023-05-13 | Discharge: 2023-05-13 | Disposition: A | Discharge status: Home / Self Care | Payer: Medicare Other | Attending: Gastroenterology | Admitting: Gastroenterology

## 2023-05-13 ENCOUNTER — Ambulatory Visit (HOSPITAL_BASED_OUTPATIENT_CLINIC_OR_DEPARTMENT_OTHER): Payer: Medicare Other | Admitting: Anesthesiology

## 2023-05-13 DIAGNOSIS — R112 Nausea with vomiting, unspecified: Secondary | ICD-10-CM | POA: Diagnosis not present

## 2023-05-13 DIAGNOSIS — Q453 Other congenital malformations of pancreas and pancreatic duct: Secondary | ICD-10-CM | POA: Diagnosis not present

## 2023-05-13 DIAGNOSIS — K8020 Calculus of gallbladder without cholecystitis without obstruction: Secondary | ICD-10-CM | POA: Diagnosis not present

## 2023-05-13 DIAGNOSIS — K3189 Other diseases of stomach and duodenum: Secondary | ICD-10-CM | POA: Diagnosis not present

## 2023-05-13 DIAGNOSIS — K208 Other esophagitis without bleeding: Secondary | ICD-10-CM | POA: Insufficient documentation

## 2023-05-13 DIAGNOSIS — K2289 Other specified disease of esophagus: Secondary | ICD-10-CM | POA: Insufficient documentation

## 2023-05-13 DIAGNOSIS — K319 Disease of stomach and duodenum, unspecified: Secondary | ICD-10-CM

## 2023-05-13 DIAGNOSIS — Z8673 Personal history of transient ischemic attack (TIA), and cerebral infarction without residual deficits: Secondary | ICD-10-CM | POA: Diagnosis not present

## 2023-05-13 DIAGNOSIS — I10 Essential (primary) hypertension: Secondary | ICD-10-CM

## 2023-05-13 DIAGNOSIS — K209 Esophagitis, unspecified without bleeding: Secondary | ICD-10-CM

## 2023-05-13 DIAGNOSIS — I899 Noninfective disorder of lymphatic vessels and lymph nodes, unspecified: Secondary | ICD-10-CM | POA: Diagnosis not present

## 2023-05-13 DIAGNOSIS — K2980 Duodenitis without bleeding: Secondary | ICD-10-CM | POA: Insufficient documentation

## 2023-05-13 DIAGNOSIS — Q399 Congenital malformation of esophagus, unspecified: Secondary | ICD-10-CM | POA: Diagnosis not present

## 2023-05-13 DIAGNOSIS — R933 Abnormal findings on diagnostic imaging of other parts of digestive tract: Secondary | ICD-10-CM | POA: Diagnosis present

## 2023-05-13 DIAGNOSIS — F02C0 Dementia in other diseases classified elsewhere, severe, without behavioral disturbance, psychotic disturbance, mood disturbance, and anxiety: Secondary | ICD-10-CM | POA: Insufficient documentation

## 2023-05-13 DIAGNOSIS — Z87891 Personal history of nicotine dependence: Secondary | ICD-10-CM | POA: Diagnosis not present

## 2023-05-13 DIAGNOSIS — K2970 Gastritis, unspecified, without bleeding: Secondary | ICD-10-CM

## 2023-05-13 DIAGNOSIS — K449 Diaphragmatic hernia without obstruction or gangrene: Secondary | ICD-10-CM | POA: Insufficient documentation

## 2023-05-13 HISTORY — DX: Unspecified osteoarthritis, unspecified site: M19.90

## 2023-05-13 HISTORY — PX: ESOPHAGOGASTRODUODENOSCOPY (EGD) WITH PROPOFOL: SHX5813

## 2023-05-13 HISTORY — DX: Nicotine dependence, unspecified, uncomplicated: F17.200

## 2023-05-13 HISTORY — DX: Gastrointestinal hemorrhage, unspecified: K92.2

## 2023-05-13 HISTORY — PX: BIOPSY: SHX5522

## 2023-05-13 HISTORY — DX: Chronic obstructive pulmonary disease, unspecified: J44.9

## 2023-05-13 HISTORY — PX: EUS: SHX5427

## 2023-05-13 HISTORY — DX: Acute respiratory failure with hypoxia: J96.01

## 2023-05-13 SURGERY — UPPER ENDOSCOPIC ULTRASOUND (EUS) RADIAL
Anesthesia: Monitor Anesthesia Care

## 2023-05-13 MED ORDER — SODIUM CHLORIDE 0.9 % IV SOLN: INTRAVENOUS | Status: DC

## 2023-05-13 MED ORDER — LIDOCAINE HCL (CARDIAC) PF 100 MG/5ML IV SOSY
PREFILLED_SYRINGE | INTRAVENOUS | Status: DC | PRN
  Administered 2023-05-13: 60 mg via INTRAVENOUS

## 2023-05-13 MED ORDER — FENTANYL CITRATE (PF) 100 MCG/2ML IJ SOLN
INTRAMUSCULAR | Status: AC
  Filled 2023-05-13: qty 2

## 2023-05-13 MED ORDER — EPHEDRINE SULFATE (PRESSORS) 50 MG/ML IJ SOLN
INTRAMUSCULAR | Status: DC | PRN
  Administered 2023-05-13: 5 mg via INTRAVENOUS

## 2023-05-13 MED ORDER — SUCRALFATE 1 G PO TABS: 1.0000 g | ORAL_TABLET | Freq: Three times a day (TID) | ORAL | 4 refills | Status: AC

## 2023-05-13 MED ORDER — ONDANSETRON HCL 4 MG/2ML IJ SOLN
INTRAMUSCULAR | Status: DC | PRN
  Administered 2023-05-13: 4 mg via INTRAVENOUS

## 2023-05-13 MED ORDER — FENTANYL CITRATE (PF) 100 MCG/2ML IJ SOLN
INTRAMUSCULAR | Status: DC | PRN
  Administered 2023-05-13 (×4): 25 ug via INTRAVENOUS

## 2023-05-13 MED ORDER — PROPOFOL 10 MG/ML IV BOLUS
INTRAVENOUS | Status: DC | PRN
  Administered 2023-05-13: 50 mg via INTRAVENOUS

## 2023-05-13 MED ORDER — PROPOFOL 1000 MG/100ML IV EMUL
INTRAVENOUS | Status: AC
  Filled 2023-05-13: qty 100

## 2023-05-13 MED ORDER — PHENYLEPHRINE HCL (PRESSORS) 10 MG/ML IV SOLN
INTRAVENOUS | Status: DC | PRN
  Administered 2023-05-13 (×4): 160 ug via INTRAVENOUS

## 2023-05-13 MED ORDER — MIDAZOLAM HCL 2 MG/2ML IJ SOLN
INTRAMUSCULAR | Status: AC
  Filled 2023-05-13: qty 2

## 2023-05-13 MED ORDER — PROPOFOL 500 MG/50ML IV EMUL
INTRAVENOUS | Status: DC | PRN
  Administered 2023-05-13: 100 ug/kg/min via INTRAVENOUS

## 2023-05-13 NOTE — H&P (Signed)
GASTROENTEROLOGY PROCEDURE H&P NOTE   Primary Care Physician: Sherol Dade, DO  HPI: Warren Sullivan is a 62 y.o. 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  Past Medical History:  Diagnosis Date   Acute respiratory failure with hypoxia (HCC)    AKI (acute kidney injury) (HCC) 08/18/2021   COPD (chronic obstructive pulmonary disease) (HCC)    CVA (cerebral vascular accident) (HCC)    Degenerative joint disease    Dehydration 08/18/2021   Dementia in other diseases classified elsewhere, severe, without behavioral disturbance, psychotic disturbance, mood disturbance, and anxiety (HCC)    Fall at home, initial encounter 08/29/2021   Gastrointestinal hemorrhage    GERD (gastroesophageal reflux disease)    Hemiplegia, unspecified affecting right dominant side (HCC)    Hyperlipidemia    Hypertension    MDD (major depressive disorder)    Nicotine dependence    Pneumonia 08/28/2021   Small bowel obstruction (HCC) 08/17/2021   Stroke (HCC)    Vitamin D deficiency    Past Surgical History:  Procedure Laterality Date   BIOPSY  08/18/2021   Procedure: BIOPSY;  Surgeon: Dolores Frame, MD;  Location: [facility];  Service: Gastroenterology;;  Bonnielee Haff and distal esophagus biopsies    BIOPSY  10/03/2021   Procedure: BIOPSY;  Surgeon: Dolores Frame, MD;  Location: [facility];  Service: Gastroenterology;;   BIOPSY  01/30/2022   Procedure: BIOPSY;  Surgeon: Dolores Frame, MD;  Location: [facility];  Service: Gastroenterology;;   BIOPSY  01/15/2023   Procedure: BIOPSY;  Surgeon: Dolores Frame, MD;  Location: [facility];  Service: Gastroenterology;;   ESOPHAGOGASTRODUODENOSCOPY (EGD) WITH PROPOFOL N/A 08/18/2021   Surgeon: Marguerita Merles, Reuel Boom, MD; severe acute nonspecific esophagitis with ulceration and reactive hyperplasia, peptic duodenitis, acute chronic gastritis with H.  pylori, 3 cm hiatal hernia.   ESOPHAGOGASTRODUODENOSCOPY (EGD) WITH PROPOFOL N/A 10/03/2021   Surgeon: Marguerita Merles, Reuel Boom, MD; myocarditis, chronic gastritis with H. pylori, 2 cm hiatal hernia, erythematous duodenopathy.   ESOPHAGOGASTRODUODENOSCOPY (EGD) WITH PROPOFOL N/A 01/30/2022   Procedure: ESOPHAGOGASTRODUODENOSCOPY (EGD) WITH PROPOFOL;  Surgeon: Dolores Frame, MD;  Location: [facility];  Service: Gastroenterology;  Laterality: N/A;   ESOPHAGOGASTRODUODENOSCOPY (EGD) WITH PROPOFOL N/A 01/15/2023   Procedure: ESOPHAGOGASTRODUODENOSCOPY (EGD) WITH PROPOFOL;  Surgeon: Dolores Frame, MD;  Location: [facility];  Service: Gastroenterology;  Laterality: N/A;   HERNIA REPAIR     HOT HEMOSTASIS  01/15/2023   Procedure: HOT HEMOSTASIS (ARGON PLASMA COAGULATION/BICAP);  Surgeon: Marguerita Merles, Reuel Boom, MD;  Location: [facility];  Service: Gastroenterology;;   Current Facility-Administered Medications  Medication Dose Route Frequency Provider Last Rate Last Admin   0.9 %  sodium chloride infusion   Intravenous Continuous Mansouraty, Netty Starring., MD        Current Facility-Administered Medications:    0.9 %  sodium chloride infusion, , Intravenous, Continuous, Mansouraty, Netty Starring., MD No Known Allergies Family History  Problem Relation Age of Onset   Hypertension Mother    Stroke Mother    Hypertension Father    Hypertension Sister    Social History   Socioeconomic History   Marital status: Divorced    Spouse name: Not on file   Number of children: Not on file   Years of education: Not on file   Highest education level: Not on file  Occupational History   Not on file  Tobacco Use   Smoking status: Former    Current packs/day:  0.00    Types: Cigarettes    Quit date: 2024    Years since quitting: 0.8   Smokeless tobacco: Not on file  Vaping Use   Vaping status: Never Used  Substance and Sexual Activity   Alcohol use: No   Drug use: No    Sexual activity: Not on file  Other Topics Concern   Not on file  Social History Narrative   Not on file   Social Determinants of Health   Financial Resource Strain: Not on file  Food Insecurity: No Food Insecurity (01/14/2023)   Hunger Vital Sign    Worried About Running Out of Food in the Last Year: Never true    Ran Out of Food in the Last Year: Never true  Transportation Needs: No Transportation Needs (01/14/2023)   PRAPARE - Administrator, Civil Service (Medical): No    Lack of Transportation (Non-Medical): No  Physical Activity: Not on file  Stress: Not on file  Social Connections: Not on file  Intimate Partner Violence: Not At Risk (01/14/2023)   Humiliation, Afraid, Rape, and Kick questionnaire    Fear of Current or Ex-Partner: No    Emotionally Abused: No    Physically Abused: No    Sexually Abused: No    Physical Exam: Today's Vitals   05/13/23 0720  BP: (!) 170/100  Pulse: (!) 59  Resp: 12  Temp: (!) 97.1 F (36.2 C)  TempSrc: Tympanic  SpO2: 97%  Weight: 103 kg  Height: 5\' 11"  (1.803 m)  PainSc: 0-No pain   Body mass index is 31.66 kg/m. GEN: NAD EYE: Sclerae anicteric ENT: MMM CV: Non-tachycardic GI: Soft, NT/ND NEURO:  Alert & Oriented x 3  Lab Results: No results for input(s): "WBC", "HGB", "HCT", "PLT" in the last 72 hours. BMET No results for input(s): "NA", "K", "CL", "CO2", "GLUCOSE", "BUN", "CREATININE", "CALCIUM" in the last 72 hours. LFT No results for input(s): "PROT", "ALBUMIN", "AST", "ALT", "ALKPHOS", "BILITOT", "BILIDIR", "IBILI" in the last 72 hours. PT/INR No results for input(s): "LABPROT", "INR" in the last 72 hours.   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  The risks of an EUS including intestinal perforation, bleeding, infection, aspiration, and medication effects were discussed as was the possibility it may not give a definitive diagnosis if  a biopsy is performed.  When a biopsy of the pancreas is done as part of the EUS, there is an additional risk of pancreatitis at the rate of about 1-2%.  It was explained that procedure related pancreatitis is typically mild, although it can be severe and even life threatening, which is why we do not perform random pancreatic biopsies and only biopsy a lesion/area we feel is concerning enough to warrant the risk.  The risks and benefits of endoscopic evaluation/treatment were discussed with the patient and/or family; these include but are not limited to the risk of perforation, infection, bleeding, missed lesions, lack of diagnosis, severe illness requiring hospitalization, as well as anesthesia and sedation related illnesses.  The patient's history has been reviewed, patient examined, no change in status, and deemed stable for procedure.  The patient and/or family is agreeable to proceed.    Corliss Parish, MD [facility] Office # [phone]

## 2023-05-13 NOTE — Op Note (Signed)
[facility] Patient Name: Warren Sullivan Procedure Date: 05/13/2023 MRN: [id number] Attending MD: Corliss Parish , MD, [id number] Date of Birth: 1960-08-27 CSN: [id number] Age: 62 Admit Type: Outpatient Procedure:                Upper EUS Indications:              Duodenal deformity on endoscopy/Subepithelial tumor                            vs. extrinsic compression, Nausea with vomiting Providers:                Corliss Parish, MD, Fransisca Connors, Harrington Challenger, Technician Referring MD:             Katrinka Blazing Medicines:                Monitored Anesthesia Care Complications:            No immediate complications. Estimated Blood Loss:     Estimated blood loss was minimal. Procedure:                Pre-Anesthesia Assessment:                           - Prior to the procedure, a History and Physical                            was performed, and patient medications and                            allergies were reviewed. The patient's tolerance of                            previous anesthesia was also reviewed. The risks                            and benefits of the procedure and the sedation                            options and risks were discussed with the patient.                            All questions were answered, and informed consent                            was obtained. Prior Anticoagulants: The patient has                            taken no anticoagulant or antiplatelet agents. ASA                            Grade Assessment: III - A patient with severe  systemic disease. After reviewing the risks and                            benefits, the patient was deemed in satisfactory                            condition to undergo the procedure.                           After obtaining informed consent, the endoscope was                            passed under direct vision. Throughout the                             procedure, the patient's blood pressure, pulse, and                            oxygen saturations were monitored continuously. The                            [id number]-[id number] ([id number]) Olympus therapeutic endoscope                            was introduced through the mouth, and advanced to                            the second part of duodenum. The [id number]                            ([id number]) Olympus duodenoscope was introduced                            through the mouth, and advanced to the area of                            papilla. The [id number]-[id number] ([id number]) Olympus radial                            ultrasound scope was introduced through the mouth,                            and advanced to the duodenum for ultrasound                            examination from the stomach and duodenum. The                            upper EUS was accomplished without difficulty. The                            patient tolerated the procedure. The [id number]                            (  [id number]) Olympus linear ultrasound scope was                            introduced through the mouth, and advanced to the                            duodenum for ultrasound examination from the                            stomach and duodenum. Scope In: Scope Out: Findings:      ENDOSCOPIC FINDING: :      No gross lesions were noted in the proximal esophagus and in the mid       esophagus.      LA Grade C (one or more mucosal breaks continuous between tops of 2 or       more mucosal folds, less than 75% circumference) esophagitis with no       bleeding was found in the distal esophagus (30-37 cm).      The distal esophagus was significantly tortuous.      The Z-line was irregular and was found 37 cm from the incisors.      A 6 cm hiatal hernia was present.      Patchy mildly erythematous mucosa was found in the entire examined       stomach (previously biopsied and negative for HP).      Patchy  moderate inflammation characterized by erosions and friability       was found in the duodenal bulb, in the first portion of the duodenum and       in the second portion of the duodenum. Biopsies were taken with a cold       forceps for histology.      The minor papilla was normal.      A single umbilicated lesion measuring 5 mm in diameter was found between       the minor papilla and major papilla. This has endoscopic appearance of a       pancreatic rest.      The major papilla was normal.      ENDOSONOGRAPHIC FINDING: :      A lobulated intramural (subepithelial) lesion was found in the second       portion of the duodenum. The lesion was hypoechoic. Endosonographically,       the lesion appeared to originate from within the deep mucosa (Layer 2).       The lesion measured 5 mm (in maximum thickness). The lesion also       measured 4 mm in diameter. The outer margins were smooth. There appeared       to be a small ductule but not overtly connected to the pancreas.      There was no sign of significant endosonographic abnormality in the       pancreatic head and genu of the pancreas. The pancreatic duct measured       up to 1.8 mm in diameter. The pancreas was well visualized, the       pancreatic duct was thin in caliber.      Multiple stones were visualized endosonographically in the gallbladder.       The stones were round. They were hyperechoic and characterized by       shadowing.      There was no  sign of significant endosonographic abnormality in the       common bile duct (3.6 mm) and in the common hepatic duct (4.7 mm). Ducts       with regular contour were identified.      Endosonographic imaging in the visualized portion of the liver showed no       mass.      No malignant-appearing lymph nodes were visualized in the celiac region       (level 20), perigastric region and porta hepatis region.      The celiac region was visualized. Impression:               - No gross  lesions in the proximal esophagus and in                            the mid esophagus.                           - LA Grade C erosive esophagitis with no bleeding                            found distally.                           - Grossly tortuous esophagus distally.                           - Z-line irregular, 37 cm from the incisors.                           - 6 cm hiatal hernia.                           - Erythematous mucosa in the stomach.                           - Duodenitis. Biopsied.                           - Normal minor papilla.                           - A single lesion endoscopically consistent with a                            pancreatic rest was found in between the minor and                            major papilla.                           - Normal major papilla.                           EUS Impression:                           - An intramural (subepithelial) lesion was found in  the second portion of the duodenum. The lesion                            appeared to originate from within the deep mucosa                            (Layer 2). Tissue has not been obtained. However,                            the endosonographic appearance is consistent with                            pancreatic rest.                           - There was no sign of significant pathology in the                            pancreatic head and genu of the pancreas.                           - Multiple stones were visualized                            endosonographically in the gallbladder.                           - There was no sign of significant pathology in the                            common bile duct and in the common hepatic duct.                           - No malignant-appearing lymph nodes were                            visualized in the celiac region (level 20),                            perigastric region and porta hepatis region. Moderate  Sedation:      Not Applicable - Patient had care per Anesthesia. Recommendation:           - The patient will be observed post-procedure,                            until all discharge criteria are met.                           - Discharge patient to home.                           - Patient has a contact number available for  emergencies. The signs and symptoms of potential                            delayed complications were discussed with the                            patient. Return to normal activities tomorrow.                            Written discharge instructions were provided to the                            patient.                           - Resume previous diet.                           - Observe patient's clinical course.                           - Continue PPI BID. Primary GI, may want to                            consider transitioning, if patient is still having                            significant esophagitis as present to PPI granules                            vs PCAB. I wonder about his tortuosity and hiatal                            hernia predisposing for difficult to control acid.                           - Carafate 1 g 4 times daily for 1 month and then                            twice daily to be restarted and continued.                           - The findings and recommendations were discussed                            with the patient.                           - The findings and recommendations were discussed                            with the patient's family. Procedure Code(s):        --- Professional ---  59563, Esophagogastroduodenoscopy, flexible,                            transoral; with endoscopic ultrasound examination                            limited to the esophagus, stomach or duodenum, and                            adjacent structures                           43239,  Esophagogastroduodenoscopy, flexible,                            transoral; with biopsy, single or multiple Diagnosis Code(s):        --- Professional ---                           K20.80, Other esophagitis without bleeding                           Q39.9, Congenital malformation of esophagus,                            unspecified                           K22.89, Other specified disease of esophagus                           K44.9, Diaphragmatic hernia without obstruction or                            gangrene                           K31.89, Other diseases of stomach and duodenum                           K29.80, Duodenitis without bleeding                           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 All rights reserved. The codes documented in this report are preliminary and upon coder review may  be revised to meet current compliance requirements. Corliss Parish, MD 05/13/2023 9:17:55 AM Number of Addenda: 0

## 2023-05-13 NOTE — Discharge Instructions (Signed)

## 2023-05-13 NOTE — Anesthesia Postprocedure Evaluation (Signed)
Anesthesia Post Note  Patient: Warren Sullivan  Procedure(s) Performed: UPPER ENDOSCOPIC ULTRASOUND (EUS) RADIAL BIOPSY     Patient location during evaluation: Endoscopy Anesthesia Type: MAC Level of consciousness: awake and alert Pain management: pain level controlled Vital Signs Assessment: post-procedure vital signs reviewed and stable Respiratory status: spontaneous breathing, nonlabored ventilation, respiratory function stable and patient connected to nasal cannula oxygen Cardiovascular status: blood pressure returned to baseline and stable Postop Assessment: no apparent nausea or vomiting Anesthetic complications: no   No notable events documented.  Last Vitals:  Vitals:   05/13/23 0940 05/13/23 0950  BP: 131/77 129/81  Pulse: (!) 59 (!) 53  Resp: (!) 0 (!) 8  Temp:    SpO2: (!) 89% 92%    Last Pain:  Vitals:   05/13/23 0950  TempSrc:   PainSc: 0-No pain                 Trevor Iha

## 2023-05-13 NOTE — Transfer of Care (Signed)
Immediate Anesthesia Transfer of Care Note  Patient: Warren Sullivan  Procedure(s) Performed: UPPER ENDOSCOPIC ULTRASOUND (EUS) RADIAL BIOPSY  Patient Location: Endoscopy Unit  Anesthesia Type:MAC  Level of Consciousness: sedated and responds to stimulation  Airway & Oxygen Therapy: Patient Spontanous Breathing and Patient connected to face mask oxygen  Post-op Assessment: Report given to RN and Post -op Vital signs reviewed and stable  Post vital signs: Reviewed and stable  Last Vitals:  Vitals Value Taken Time  BP 92/63 05/13/23 0910  Temp    Pulse 65 05/13/23 0912  Resp 16 05/13/23 0912  SpO2 98 % 05/13/23 0912  Vitals shown include unfiled device data.  Last Pain:  Vitals:   05/13/23 0720  TempSrc: Tympanic  PainSc: 0-No pain         Complications: No notable events documented.

## 2023-05-14 ENCOUNTER — Encounter: Payer: Self-pay | Admitting: Gastroenterology

## 2023-05-14 LAB — SURGICAL PATHOLOGY

## 2023-05-15 ENCOUNTER — Encounter (HOSPITAL_COMMUNITY): Payer: Self-pay | Admitting: Gastroenterology

## 2023-05-16 ENCOUNTER — Encounter (INDEPENDENT_AMBULATORY_CARE_PROVIDER_SITE_OTHER): Payer: Self-pay | Admitting: Gastroenterology

## 2023-05-16 ENCOUNTER — Ambulatory Visit (INDEPENDENT_AMBULATORY_CARE_PROVIDER_SITE_OTHER): Payer: Medicare Other | Admitting: Gastroenterology

## 2023-05-16 VITALS — BP 154/94 | HR 72 | Temp 97.5°F | Ht 71.0 in

## 2023-05-16 DIAGNOSIS — K209 Esophagitis, unspecified without bleeding: Secondary | ICD-10-CM | POA: Diagnosis not present

## 2023-05-16 DIAGNOSIS — K920 Hematemesis: Secondary | ICD-10-CM

## 2023-05-16 DIAGNOSIS — K449 Diaphragmatic hernia without obstruction or gangrene: Secondary | ICD-10-CM | POA: Diagnosis not present

## 2023-05-16 DIAGNOSIS — Q453 Other congenital malformations of pancreas and pancreatic duct: Secondary | ICD-10-CM

## 2023-05-16 DIAGNOSIS — K922 Gastrointestinal hemorrhage, unspecified: Secondary | ICD-10-CM

## 2023-05-16 DIAGNOSIS — K2210 Ulcer of esophagus without bleeding: Secondary | ICD-10-CM | POA: Insufficient documentation

## 2023-05-16 MED ORDER — VOQUEZNA 10 MG PO TABS
10.0000 mg | ORAL_TABLET | Freq: Every day | ORAL | 3 refills | Status: AC
Start: 2023-05-16 — End: ?

## 2023-05-16 MED ORDER — VOQUEZNA 20 MG PO TABS: 20.0000 mg | ORAL_TABLET | Freq: Two times a day (BID) | ORAL | 1 refills | Status: DC

## 2023-05-16 NOTE — Progress Notes (Signed)
Katrinka Blazing, M.D. Gastroenterology & Hepatology [facility] Gastroenterology [address] [place], [place] [address]  Primary Care Physician: Sherol Dade, DO [address] [place] [address]  I will communicate my assessment and recommendations to the referring MD via EMR.  Problems: Persistent esophagitis Large hiatal hernia Pancreatic rest  History of Present Illness: Warren Sullivan is a 62 y.o. male with past medical history of COPD, CVA with left hemiparesis, dementia, hypertension, hyperlipidemia, BPH, GERD  who presents for follow up of acute and coffee-ground emesis.  The patient was last seen on 04/08/2023. At that time, the patient was advised to continue Carafate 1 g twice daily and Protonix 40 mg twice daily.  He proceeded with endoscopic ultrasound with Dr. Meridee Score as described below, found to have a pancreatic rest.  Patient comes to the office with sister, who helps providing medical history. Patient lives in nursing home. Sister states last vomiting episode was a week ago, possibly had some coffee ground emesis. She states he has presented intermittent episodes in the past, for which he has had to come to the hospital multiple times.  She reports he has been compliant with his medications.  The patient denies other complaints.  Not having any nausea, vomiting, fever, chills, hematochezia, melena, hematemesis, abdominal distention, abdominal pain, diarrhea, jaundice, pruritus or weight loss.  Last EGD/EUS: 05/13/2023 - No gross lesions in the proximal esophagus and in the mid esophagus. - LA Grade C erosive esophagitis with no bleeding found distally. - Grossly tortuous esophagus distally. - Z- line irregular, 37 cm from the incisors. - 6 cm hiatal hernia. - Erythematous mucosa in the stomach. - Duodenitis. Biopsied. - Normal minor papilla. - A single lesion endoscopically consistent with a pancreatic rest was found in between  the minor and major papilla. - Normal major papilla.  EUS Impression: - An intramural ( subepithelial) lesion was found in the second portion of the duodenum. The lesion appeared to originate from within the deep mucosa ( Layer 2) . Tissue has not been obtained. However, the endosonographic appearance is consistent with pancreatic rest. - There was no sign of significant pathology in the pancreatic head and genu of the pancreas. - Multiple stones were visualized endosonographically in the gallbladder. - There was no sign of significant pathology in the common bile duct and in the common hepatic duct. - No malignant- appearing lymph nodes were visualized in the celiac region ( level 20) , perigastric region and porta hepatis region.  Past Medical History: Past Medical History:  Diagnosis Date   Acute respiratory failure with hypoxia (HCC)    AKI (acute kidney injury) (HCC) 08/18/2021   COPD (chronic obstructive pulmonary disease) (HCC)    CVA (cerebral vascular accident) (HCC)    Degenerative joint disease    Dehydration 08/18/2021   Dementia in other diseases classified elsewhere, severe, without behavioral disturbance, psychotic disturbance, mood disturbance, and anxiety (HCC)    Fall at home, initial encounter 08/29/2021   Gastrointestinal hemorrhage    GERD (gastroesophageal reflux disease)    Hemiplegia, unspecified affecting right dominant side (HCC)    Hyperlipidemia    Hypertension    MDD (major depressive disorder)    Nicotine dependence    Pneumonia 08/28/2021   Small bowel obstruction (HCC) 08/17/2021   Stroke (HCC)    Vitamin D deficiency     Past Surgical History: Past Surgical History:  Procedure Laterality Date   BIOPSY  08/18/2021   Procedure: BIOPSY;  Surgeon: Levon Hedger  Alisia Ferrari, MD;  [facility]: [facility];  Service: Gastroenterology;;  Bonnielee Haff and distal esophagus biopsies    BIOPSY  10/03/2021   Procedure: BIOPSY;  Surgeon: Dolores Frame, MD;  Location: [facility];  Service: Gastroenterology;;   BIOPSY  01/30/2022   Procedure: BIOPSY;  Surgeon: Dolores Frame, MD;  [facility]: [facility];  Service: Gastroenterology;;   BIOPSY  01/15/2023   Procedure: BIOPSY;  Surgeon: Dolores Frame, MD;  [facility]: [facility];  Service: Gastroenterology;;   BIOPSY  05/13/2023   Procedure: BIOPSY;  Surgeon: Lemar Lofty., MD;  Location[facility];  Service: Gastroenterology;;   ESOPHAGOGASTRODUODENOSCOPY (EGD) WITH PROPOFOL N/A 08/18/2021   Surgeon: Dolores Frame, MD; severe acute nonspecific esophagitis with ulceration and reactive hyperplasia, peptic duodenitis, acute chronic gastritis with H. pylori, 3 cm hiatal hernia.   ESOPHAGOGASTRODUODENOSCOPY (EGD) WITH PROPOFOL N/A 10/03/2021   Surgeon: Marguerita Merles, Reuel Boom, MD; myocarditis, chronic gastritis with H. pylori, 2 cm hiatal hernia, erythematous duodenopathy.   ESOPHAGOGASTRODUODENOSCOPY (EGD) WITH PROPOFOL N/A 01/30/2022   Procedure: ESOPHAGOGASTRODUODENOSCOPY (EGD) WITH PROPOFOL;  Surgeon: Dolores Frame, MD;  [facility]: [facility];  Service: Gastroenterology;  Laterality: N/A;   ESOPHAGOGASTRODUODENOSCOPY (EGD) WITH PROPOFOL N/A 01/15/2023   Procedure: ESOPHAGOGASTRODUODENOSCOPY (EGD) WITH PROPOFOL;  Surgeon: Dolores Frame, MD;  [facility]: [facility];  Service: Gastroenterology;  Laterality: N/A;   ESOPHAGOGASTRODUODENOSCOPY (EGD) WITH PROPOFOL N/A 05/13/2023   Procedure: ESOPHAGOGASTRODUODENOSCOPY (EGD) WITH PROPOFOL;  Surgeon: Meridee Score Netty Starring., MD;  Location: [facility];  Service: Gastroenterology;  Laterality: N/A;   EUS N/A 05/13/2023   Procedure: UPPER ENDOSCOPIC ULTRASOUND (EUS) RADIAL;  Surgeon: Lemar Lofty., MD;  Location: [facility];  Service: Gastroenterology;  Laterality: N/A;   HERNIA REPAIR     HOT HEMOSTASIS  01/15/2023   Procedure: HOT HEMOSTASIS (ARGON PLASMA  COAGULATION/BICAP);  Surgeon: Marguerita Merles, Reuel Boom, MD;  Location: [facility];  Service: Gastroenterology;;    Family History: Family History  Problem Relation Age of Onset   Hypertension Mother    Stroke Mother    Hypertension Father    Hypertension Sister     Social History: Social History   Tobacco Use  Smoking Status Former   Current packs/day: 0.00   Types: Cigarettes   Quit date: 2024   Years since quitting: 0.8  Smokeless Tobacco Not on file   Social History   Substance and Sexual Activity  Alcohol Use No   Social History   Substance and Sexual Activity  Drug Use No    Allergies: No Known Allergies  Medications: Current Outpatient Medications  Medication Sig Dispense Refill   acetaminophen (TYLENOL) 650 MG CR tablet Take 1,300 mg by mouth every 8 (eight) hours as needed (general discomfort).     albuterol (PROVENTIL) (2.5 MG/3ML) 0.083% nebulizer solution Take 3 mLs (2.5 mg total) by nebulization every 4 (four) hours as needed for wheezing or shortness of breath. 75 mL 12   atorvastatin (LIPITOR) 20 MG tablet Take 1 tablet (20 mg total) by mouth at bedtime.     Calcium Carbonate (CALCIUM 500 PO) Take 1 tablet by mouth daily.     Cholecalciferol 25 MCG (1000 UT) capsule Take 5,000 Units by mouth daily.     cloNIDine (CATAPRES) 0.1 MG tablet Take 2 tablets (0.2 mg total) by mouth 2 (two) times daily. 60 tablet 3   dicyclomine (BENTYL) 20 MG tablet Take one every 8 hours for abd cramps 20 tablet 0   memantine (NAMENDA)  10 MG tablet Take 10 mg by mouth 2 (two) times daily.     metoCLOPramide (REGLAN) 5 MG tablet Take 1 tablet (5 mg total) by mouth 3 (three) times daily. 90 tablet 0   Multiple Vitamin (MULTIVITAMIN) tablet Take 1 tablet by mouth daily.     Nutritional Supplements (ENSURE ORIGINAL) LIQD Take by mouth 3 (three) times daily.     ondansetron (ZOFRAN) 4 MG tablet Take 4 mg by mouth every 8 (eight) hours as needed for nausea or vomiting.      OVER THE COUNTER MEDICATION in the morning, at noon, and at bedtime. Prostate Max     pantoprazole (PROTONIX) 40 MG tablet Take 1 tablet (40 mg total) by mouth 2 (two) times daily. 60 tablet 3   polyethylene glycol (MIRALAX / GLYCOLAX) 17 g packet Take 17 g by mouth daily. 14 each 0   sucralfate (CARAFATE) 1 g tablet Take 1 tablet (1 g total) by mouth 4 (four) times daily -  with meals and at bedtime. For 1 month then twice daily. 120 tablet 4   tamsulosin (FLOMAX) 0.4 MG CAPS capsule Take 0.4 mg by mouth daily.     traZODone (DESYREL) 50 MG tablet Take 50 mg by mouth at bedtime.     VASCEPA 1 g capsule Take 2 g by mouth 2 (two) times daily.     Venlafaxine HCl 75 MG TB24 Take 75 mg by mouth daily.     promethazine (PHENERGAN) 12.5 MG tablet Take 1 tablet (12.5 mg total) by mouth every 8 (eight) hours as needed for nausea or vomiting. 15 tablet 0   QUEtiapine (SEROQUEL) 25 MG tablet Take 1 tablet (25 mg total) by mouth at bedtime for 14 days. 14 tablet 0   No current facility-administered medications for this visit.    Review of Systems: GENERAL: negative for malaise, night sweats HEENT: No changes in hearing or vision, no nose bleeds or other nasal problems. NECK: Negative for lumps, goiter, pain and significant neck swelling RESPIRATORY: Negative for cough, wheezing CARDIOVASCULAR: Negative for chest pain, leg swelling, palpitations, orthopnea GI: SEE HPI MUSCULOSKELETAL: Negative for joint pain or swelling, back pain, and muscle pain. SKIN: Negative for lesions, rash PSYCH: Negative for sleep disturbance, mood disorder and recent psychosocial stressors. HEMATOLOGY Negative for prolonged bleeding, bruising easily, and swollen nodes. ENDOCRINE: Negative for cold or heat intolerance, polyuria, polydipsia and goiter. NEURO: negative for tremor, gait imbalance, syncope and seizures. The remainder of the review of systems is noncontributory.   Physical Exam: BP (!) 154/94 (BP Location:  Left Arm, Patient Position: Sitting, Cuff Size: Normal)   Pulse 72   Temp (!) 97.5 F (36.4 C) (Temporal)   Ht 5\' 11"  (1.803 m)   BMI 31.66 kg/m  GENERAL: The patient is AO x3, in no acute distress. Sitting in wheelchair. HEENT: Head is normocephalic and atraumatic. EOMI are intact. Mouth is well hydrated and without lesions. NECK: Supple. No masses LUNGS: Clear to auscultation. No presence of rhonchi/wheezing/rales. Adequate chest expansion HEART: RRR, normal s1 and s2. ABDOMEN: Soft, nontender, no guarding, no peritoneal signs, and nondistended. BS +. No masses. EXTREMITIES: Without any cyanosis, clubbing, rash, lesions or edema. NEUROLOGIC: AOx3, no focal motor deficit. SKIN: no jaundice, no rashes  Imaging/Labs: as above  I personally reviewed and interpreted the available labs, imaging and endoscopic files.  Impression and Plan: THEORY IMIG is a 62 y.o. male with past medical history of COPD, CVA with left hemiparesis, dementia, hypertension, hyperlipidemia, BPH,  GERD  who presents for follow up of acute and coffee-ground emesis.  Patient has presented multiple episodes of coffee-ground emesis in the past, for which she has been taken to the ER multiple episodes.  He never had any significant decrease in his hemoglobin.  Underwent multiple endoscopic investigations that have shown esophagitis that has been refractory to different PPIs.  I discussed with his sister that we could try variable PPIs or Voquezna to potentially suppress his acid as much as possible.  However, I explained that if this fails, we may need to have an evaluation for hiatal hernia repair.  He is not the best candidate to undergo surgery given his history of strokes but this may be considered as a last resort option.  -Start Voquezna 20 mg every day for 2 months, then decrease to 10 mg every day -Stop pantoprazole -If medication is not approved, may consider Zegerid -If persistent symptoms despite adequate  acid suppression, we will need to consider referral for hiatal hernia repair and Nissen fundoplication.  All questions were answered.      Katrinka Blazing, MD Gastroenterology and Hepatology [facility] Gastroenterology

## 2023-05-16 NOTE — Patient Instructions (Addendum)
Start Voquezna 20 mg every day for 2 months, then decrease to 10 mg every day Stop pantoprazole If medication is not approved, may consider Zegerid If persistent symptoms despite adequate acid suppression, we will need to consider referral for hiatal hernia repair and Nissen fundoplication.

## 2023-07-08 ENCOUNTER — Ambulatory Visit (INDEPENDENT_AMBULATORY_CARE_PROVIDER_SITE_OTHER): Payer: Medicare Other | Admitting: Gastroenterology

## 2023-08-05 ENCOUNTER — Emergency Department (HOSPITAL_COMMUNITY)
Admission: EM | Admit: 2023-08-05 | Discharge: 2023-08-05 | Disposition: A | Discharge status: Home / Self Care | Payer: Medicare Other | Attending: Emergency Medicine | Admitting: Emergency Medicine

## 2023-08-05 ENCOUNTER — Other Ambulatory Visit: Payer: Self-pay

## 2023-08-05 DIAGNOSIS — F0390 Unspecified dementia without behavioral disturbance: Secondary | ICD-10-CM | POA: Insufficient documentation

## 2023-08-05 DIAGNOSIS — Z79899 Other long term (current) drug therapy: Secondary | ICD-10-CM | POA: Insufficient documentation

## 2023-08-05 DIAGNOSIS — K920 Hematemesis: Secondary | ICD-10-CM | POA: Diagnosis present

## 2023-08-05 DIAGNOSIS — J449 Chronic obstructive pulmonary disease, unspecified: Secondary | ICD-10-CM | POA: Diagnosis not present

## 2023-08-05 DIAGNOSIS — I10 Essential (primary) hypertension: Secondary | ICD-10-CM | POA: Insufficient documentation

## 2023-08-05 LAB — CBC WITH DIFFERENTIAL/PLATELET
Abs Immature Granulocytes: 0.08 10*3/uL — ABNORMAL HIGH (ref 0.00–0.07)
Basophils Absolute: 0 10*3/uL (ref 0.0–0.1)
Basophils Relative: 0 %
Eosinophils Absolute: 0 10*3/uL (ref 0.0–0.5)
Eosinophils Relative: 0 %
HCT: 45.3 % (ref 39.0–52.0)
Hemoglobin: 15.4 g/dL (ref 13.0–17.0)
Immature Granulocytes: 1 %
Lymphocytes Relative: 11 %
Lymphs Abs: 1.4 10*3/uL (ref 0.7–4.0)
MCH: 29.6 pg (ref 26.0–34.0)
MCHC: 34 g/dL (ref 30.0–36.0)
MCV: 87.1 fL (ref 80.0–100.0)
Monocytes Absolute: 0.7 10*3/uL (ref 0.1–1.0)
Monocytes Relative: 5 %
Neutro Abs: 10.5 10*3/uL — ABNORMAL HIGH (ref 1.7–7.7)
Neutrophils Relative %: 83 %
Platelets: 207 10*3/uL (ref 150–400)
RBC: 5.2 MIL/uL (ref 4.22–5.81)
RDW: 13.4 % (ref 11.5–15.5)
WBC: 12.6 10*3/uL — ABNORMAL HIGH (ref 4.0–10.5)
nRBC: 0 % (ref 0.0–0.2)

## 2023-08-05 LAB — TYPE AND SCREEN
ABO/RH(D): O POS
Antibody Screen: NEGATIVE

## 2023-08-05 LAB — COMPREHENSIVE METABOLIC PANEL
ALT: 12 U/L (ref 0–44)
AST: 15 U/L (ref 15–41)
Albumin: 4.5 g/dL (ref 3.5–5.0)
Alkaline Phosphatase: 74 U/L (ref 38–126)
Anion gap: 12 (ref 5–15)
BUN: 24 mg/dL — ABNORMAL HIGH (ref 8–23)
CO2: 33 mmol/L — ABNORMAL HIGH (ref 22–32)
Calcium: 9.9 mg/dL (ref 8.9–10.3)
Chloride: 94 mmol/L — ABNORMAL LOW (ref 98–111)
Creatinine, Ser: 1.1 mg/dL (ref 0.61–1.24)
GFR, Estimated: >60 mL/min (ref >60)
Glucose, Bld: 135 mg/dL — ABNORMAL HIGH (ref 70–99)
Potassium: 3.8 mmol/L (ref 3.5–5.1)
Sodium: 139 mmol/L (ref 135–145)
Total Bilirubin: 1.1 mg/dL (ref 0.0–1.2)
Total Protein: 9.1 g/dL — ABNORMAL HIGH (ref 6.5–8.1)

## 2023-08-05 LAB — PROTIME-INR
INR: 1.1 (ref 0.8–1.2)
Prothrombin Time: 14.2 s (ref 11.4–15.2)

## 2023-08-05 LAB — HEMOGLOBIN AND HEMATOCRIT, BLOOD
HCT: 45 % (ref 39.0–52.0)
Hemoglobin: 14.9 g/dL (ref 13.0–17.0)

## 2023-08-05 MED ORDER — ONDANSETRON HCL 4 MG/2ML IJ SOLN
4.0000 mg | Freq: Once | INTRAMUSCULAR | Status: AC
  Administered 2023-08-05: 4 mg via INTRAVENOUS
  Filled 2023-08-05: qty 2

## 2023-08-05 NOTE — ED Notes (Signed)
Patient given water to drink.

## 2023-08-05 NOTE — ED Notes (Signed)
Patient vomited, Clear with brown specks noted.

## 2023-08-05 NOTE — Discharge Instructions (Addendum)
The gastroenterology physician should be calling you for an appointment this week.

## 2023-08-05 NOTE — ED Triage Notes (Signed)
Pt BIB [facility] facility reported clear vomit yesterday. Today facility noticed pt had brown vomit with specks that looked like coffee grounds.

## 2023-08-05 NOTE — ED Provider Notes (Signed)
[place] EMERGENCY DEPARTMENT AT [facility] Provider Note   CSN: [id number] Arrival date & time: 08/05/23  4782     History  Chief Complaint  Patient presents with   Hematemesis    Warren Sullivan is a 63 y.o. male.  HPI Patient sent in for hematemesis.  Reportedly had coffee-ground emesis.  History of same.  Patient has dementia and is without complaints at this time.  Reviewed GI notes and has somewhat chronic GI bleeds both from esophagitis.    Past Medical History:  Diagnosis Date   Acute respiratory failure with hypoxia (HCC)    AKI (acute kidney injury) (HCC) 08/18/2021   COPD (chronic obstructive pulmonary disease) (HCC)    CVA (cerebral vascular accident) (HCC)    Degenerative joint disease    Dehydration 08/18/2021   Dementia in other diseases classified elsewhere, severe, without behavioral disturbance, psychotic disturbance, mood disturbance, and anxiety (HCC)    Fall at home, initial encounter 08/29/2021   Gastrointestinal hemorrhage    GERD (gastroesophageal reflux disease)    Hemiplegia, unspecified affecting right dominant side (HCC)    Hyperlipidemia    Hypertension    MDD (major depressive disorder)    Nicotine dependence    Pneumonia 08/28/2021   Small bowel obstruction (HCC) 08/17/2021   Stroke (HCC)    Vitamin D deficiency     Home Medications Prior to Admission medications   Medication Sig Start Date End Date Taking? Authorizing Provider  acetaminophen (TYLENOL) 650 MG CR tablet Take 1,300 mg by mouth every 8 (eight) hours as needed (general discomfort).    [provider]  albuterol (PROVENTIL) (2.5 MG/3ML) 0.083% nebulizer solution Take 3 mLs (2.5 mg total) by nebulization every 4 (four) hours as needed for wheezing or shortness of breath. 01/30/22   Johnson, Clanford L, MD  atorvastatin (LIPITOR) 20 MG tablet Take 1 tablet (20 mg total) by mouth at bedtime. 06/11/22   Glade Lloyd, MD  Calcium Carbonate (CALCIUM 500  PO) Take 1 tablet by mouth daily.    [provider]  Cholecalciferol 25 MCG (1000 UT) capsule Take 5,000 Units by mouth daily.    [provider]  cloNIDine (CATAPRES) 0.1 MG tablet Take 2 tablets (0.2 mg total) by mouth 2 (two) times daily. 08/20/21   Shon Hale, MD  dicyclomine (BENTYL) 20 MG tablet Take one every 8 hours for abd cramps 01/04/23   [name], MD  memantine (NAMENDA) 10 MG tablet Take 10 mg by mouth 2 (two) times daily. 07/24/21   [provider]  metoCLOPramide (REGLAN) 5 MG tablet Take 1 tablet (5 mg total) by mouth 3 (three) times daily. 01/16/23   Azucena Fallen, MD  Multiple Vitamin (MULTIVITAMIN) tablet Take 1 tablet by mouth daily.    [provider]  Nutritional Supplements (ENSURE ORIGINAL) LIQD Take by mouth 3 (three) times daily.    [provider]  ondansetron (ZOFRAN) 4 MG tablet Take 4 mg by mouth every 8 (eight) hours as needed for nausea or vomiting. 02/26/22   [provider]  OVER THE COUNTER MEDICATION in the morning, at noon, and at bedtime. Prostate Max    [provider]  polyethylene glycol (MIRALAX / GLYCOLAX) 17 g packet Take 17 g by mouth daily. 12/02/22   Vassie Loll, MD  promethazine (PHENERGAN) 12.5 MG tablet Take 1 tablet (12.5 mg total) by mouth every 8 (eight) hours as needed for nausea or vomiting. 04/27/23   Sloan Leiter, DO  QUEtiapine (SEROQUEL) 25 MG tablet Take 1 tablet (25 mg total) by mouth at bedtime for 14 days. 04/27/23 05/11/23  Sloan Leiter, DO  sucralfate (CARAFATE) 1 g tablet Take 1 tablet (1 g total) by mouth 4 (four) times daily -  with meals and at bedtime. For 1 month then twice daily. 05/13/23 10/10/23  Mansouraty, Netty Starring., MD  tamsulosin (FLOMAX) 0.4 MG CAPS capsule Take 0.4 mg by mouth daily. 07/11/21   [provider]  traZODone (DESYREL) 50 MG tablet Take 50 mg by mouth at bedtime.    [provider]  VASCEPA 1 g capsule Take 2 g by  mouth 2 (two) times daily. 08/08/21   [provider]  Venlafaxine HCl 75 MG TB24 Take 75 mg by mouth daily.    [provider]  Vonoprazan Fumarate (VOQUEZNA) 10 MG TABS Take 10 mg by mouth daily. 05/16/23   Dolores Frame, MD  Vonoprazan Fumarate (VOQUEZNA) 20 MG TABS Take 20 mg by mouth 2 (two) times daily. 05/16/23   Dolores Frame, MD      Allergies    Patient has no known allergies.    Review of Systems   Review of Systems  Physical Exam Updated Vital Signs BP 130/83 (BP Location: Left Arm)   Pulse (!) 101   Temp 98.9 F (37.2 C) (Oral)   Resp 20   Ht 5\' 11"  (1.803 m)   Wt 83.9 kg   SpO2 91%   BMI 25.80 kg/m  Physical Exam Vitals reviewed.  HENT:     Mouth/Throat:     Comments: Dried coffee-ground emesis around mouth. Cardiovascular:     Rate and Rhythm: Normal rate.  Abdominal:     Tenderness: There is no abdominal tenderness.  Neurological:     Mental Status: He is alert.     ED Results / Procedures / Treatments   Labs (all labs ordered are listed, but only abnormal results are displayed) Labs Reviewed  COMPREHENSIVE METABOLIC PANEL - Abnormal; Notable for the following components:      Result Value   Chloride 94 (*)    CO2 33 (*)    Glucose, Bld 135 (*)    BUN 24 (*)    Total Protein 9.1 (*)    All other components within normal limits  CBC WITH DIFFERENTIAL/PLATELET - Abnormal; Notable for the following components:   WBC 12.6 (*)    Neutro Abs 10.5 (*)    Abs Immature Granulocytes 0.08 (*)    All other components within normal limits  PROTIME-INR  HEMOGLOBIN AND HEMATOCRIT, BLOOD  TYPE AND SCREEN    EKG None  Radiology No results found.  Procedures Procedures    Medications Ordered in ED Medications  ondansetron (ZOFRAN) injection 4 mg (4 mg Intravenous Given 08/05/23 1310)    ED Course/ Medical Decision Making/ A&P                                 Medical Decision Making Amount and/or  Complexity of Data Reviewed Labs: ordered.  Risk Prescription drug management.   Patient with likely upper GI bleed.  History of same.  Patient is demented but without complaints.  Vitals reassuring.  Reviewed recent GI note.  Reviewed patient's MOST form which states he is comfort care only but would want antibiotics and some treatment.  However has had numerous treatments since this had been filled out back in July.  Hemoglobin overall reassuring.  Did have another episode of vomiting here.  Discussed with Dr. Marletta Lor from GI.  Reviewed records and is familiar with the patient.  Thinks it is reasonable to follow-up later this week in the office instead of admission to the hospital.  Will discharge back to nursing home.        Final Clinical Impression(s) / ED Diagnoses Final diagnoses:  Hematemesis with nausea    Rx / DC Orders ED Discharge Orders     None         Benjiman Core, MD 08/05/23 1517

## 2023-08-20 ENCOUNTER — Encounter (INDEPENDENT_AMBULATORY_CARE_PROVIDER_SITE_OTHER): Payer: Self-pay | Admitting: Gastroenterology

## 2023-08-20 ENCOUNTER — Ambulatory Visit (INDEPENDENT_AMBULATORY_CARE_PROVIDER_SITE_OTHER): Payer: Medicare Other | Admitting: Gastroenterology

## 2023-08-20 VITALS — BP 122/70 | HR 76 | Temp 97.4°F | Ht 71.0 in

## 2023-08-20 DIAGNOSIS — K2210 Ulcer of esophagus without bleeding: Secondary | ICD-10-CM

## 2023-08-20 DIAGNOSIS — K449 Diaphragmatic hernia without obstruction or gangrene: Secondary | ICD-10-CM | POA: Diagnosis not present

## 2023-08-20 DIAGNOSIS — K920 Hematemesis: Secondary | ICD-10-CM | POA: Diagnosis not present

## 2023-08-20 DIAGNOSIS — K209 Esophagitis, unspecified without bleeding: Secondary | ICD-10-CM | POA: Diagnosis not present

## 2023-08-20 NOTE — Progress Notes (Addendum)
 Referring Provider: Sherol Dade, * Primary Care Physician:  Sherol Dade, DO Primary GI Physician: Dr. Levon Hedger   Chief Complaint  Patient presents with   Follow-up    Patient is at [facility]. arrives with sister Warren Sullivan for a follow up. States he is doing well.    HPI:   Warren Sullivan is a 63 y.o. male with past medical history of COPD, CVA with left hemiparesis, dementia, hypertension, hyperlipidemia, BPH, GERD   Patient presenting today for follow up of GERD and coffee ground emesis  Last seen November 2024 by Dr. Levon Hedger, at that time patient currently residing in nursing home, reported last vomiting episode about a week ago with possibly some coffee-ground emesis.  Having some intermittent episodes of this in the past as well and multiple hospital visits for the same.  Reports compliance with medications.  Patient recommended to start requesting 20 mg every day x 2 months and decrease to 10 mg daily thereafter, stop pantoprazole, consider zegerid if requested not approved, will need to consider referral for hiatal hernia repair and Nissen fundoplication to persistent symptoms despite adequate acid suppression.  Present: Patient with ED visit on 08/05/2023 with reported coffee-ground emesis, EDP discussed case with Dr. Marletta Lor who felt patient was stable for outpatient follow-up with our team.    Most recent labs on 08/05/2023 with hemoglobin 14.9, INR 1.1  Patient arrives with his sister Warren Sullivan who states he is doing better. She reports she thinks he may have had a virus during the time of ED visit as one was going around at his nursing facility and wonders if this prompted his vomiting. He has had no further episodes and does not think he had had any prior to this one since his last OV with [place] in november. Patient reports he is doing fine and has no complaints. He denies any GERD symptoms, nausea, vomiting, dysphagia/odynophagia or appetite changes.  He is  currently on voquezna 10mg  daily and carafate 1g BID.     Last EGD/EUS: 05/13/2023 - No gross lesions in the proximal esophagus and in the mid esophagus. - LA Grade C erosive esophagitis with no bleeding found distally. - Grossly tortuous esophagus distally. - Z- line irregular, 37 cm from the incisors. - 6 cm hiatal hernia. - Erythematous mucosa in the stomach. - Duodenitis. Biopsied. - Normal minor papilla. - A single lesion endoscopically consistent with a pancreatic rest was found in between the minor and major papilla. - Normal major papilla.   EUS Impression: - An intramural ( subepithelial) lesion was found in the second portion of the duodenum. The lesion appeared to originate from within the deep mucosa ( Layer 2) . Tissue has not been obtained. However, the endosonographic appearance is consistent with pancreatic rest. - There was no sign of significant pathology in the pancreatic head and genu of the pancreas. - Multiple stones were visualized endosonographically in the gallbladder. - There was no sign of significant pathology in the common bile duct and in the common hepatic duct. - No malignant- appearing lymph nodes were visualized in the celiac region ( level 20) , perigastric region and porta hepatis region.     Past Medical History:  Diagnosis Date   Acute respiratory failure with hypoxia (HCC)    AKI (acute kidney injury) (HCC) 08/18/2021   COPD (chronic obstructive pulmonary disease) (HCC)    CVA (cerebral vascular accident) (HCC)    Degenerative joint disease    Dehydration 08/18/2021   Dementia in other  diseases classified elsewhere, severe, without behavioral disturbance, psychotic disturbance, mood disturbance, and anxiety (HCC)    Fall at home, initial encounter 08/29/2021   Gastrointestinal hemorrhage    GERD (gastroesophageal reflux disease)    Hemiplegia, unspecified affecting right dominant side (HCC)    Hyperlipidemia    Hypertension    MDD (major depressive  disorder)    Nicotine dependence    Pneumonia 08/28/2021   Small bowel obstruction (HCC) 08/17/2021   Stroke (HCC)    Vitamin D deficiency     Past Surgical History:  Procedure Laterality Date   BIOPSY  08/18/2021   Procedure: BIOPSY;  Surgeon: Dolores Frame, MD;  [facility]: [facility];  Service: Gastroenterology;;  Bonnielee Haff and distal esophagus biopsies    BIOPSY  10/03/2021   Procedure: BIOPSY;  Surgeon: Dolores Frame, MD;  [facility]: [facility];  Service: Gastroenterology;;   BIOPSY  01/30/2022   Procedure: BIOPSY;  Surgeon: Dolores Frame, MD;  [facility];  Service: Gastroenterology;;   BIOPSY  01/15/2023   Procedure: BIOPSY;  Surgeon: Dolores Frame, MD;  [facility];  Service: Gastroenterology;;   BIOPSY  05/13/2023   Procedure: BIOPSY;  Surgeon: Lemar Lofty., MD;  Location[facility];  Service: Gastroenterology;;   ESOPHAGOGASTRODUODENOSCOPY (EGD) WITH PROPOFOL N/A 08/18/2021   Surgeon: Dolores Frame, MD; severe acute nonspecific esophagitis with ulceration and reactive hyperplasia, peptic duodenitis, acute chronic gastritis with H. pylori, 3 cm hiatal hernia.   ESOPHAGOGASTRODUODENOSCOPY (EGD) WITH PROPOFOL N/A 10/03/2021   Surgeon: Marguerita Merles, Reuel Boom, MD; myocarditis, chronic gastritis with H. pylori, 2 cm hiatal hernia, erythematous duodenopathy.   ESOPHAGOGASTRODUODENOSCOPY (EGD) WITH PROPOFOL N/A 01/30/2022   Procedure: ESOPHAGOGASTRODUODENOSCOPY (EGD) WITH PROPOFOL;  Surgeon: Dolores Frame, MD;  [facility];  Service: Gastroenterology;  Laterality: N/A;   ESOPHAGOGASTRODUODENOSCOPY (EGD) WITH PROPOFOL N/A 01/15/2023   Procedure: ESOPHAGOGASTRODUODENOSCOPY (EGD) WITH PROPOFOL;  Surgeon: Dolores Frame, MD;  [facility];  Service: Gastroenterology;  Laterality: N/A;   ESOPHAGOGASTRODUODENOSCOPY (EGD) WITH PROPOFOL N/A  05/13/2023   Procedure: ESOPHAGOGASTRODUODENOSCOPY (EGD) WITH PROPOFOL;  Surgeon: Meridee Score Netty Starring., MD;  Location: [facility];  Service: Gastroenterology;  Laterality: N/A;   EUS N/A 05/13/2023   Procedure: UPPER ENDOSCOPIC ULTRASOUND (EUS) RADIAL;  Surgeon: Lemar Lofty., MD;  Location: [facility];  Service: Gastroenterology;  Laterality: N/A;   HERNIA REPAIR     HOT HEMOSTASIS  01/15/2023   Procedure: HOT HEMOSTASIS (ARGON PLASMA COAGULATION/BICAP);  Surgeon: Marguerita Merles, Reuel Boom, MD;  Location: [facility];  Service: Gastroenterology;;    Current Outpatient Medications  Medication Sig Dispense Refill   acetaminophen (TYLENOL) 650 MG CR tablet Take 1,300 mg by mouth every 8 (eight) hours as needed (general discomfort).     albuterol (PROVENTIL) (2.5 MG/3ML) 0.083% nebulizer solution Take 3 mLs (2.5 mg total) by nebulization every 4 (four) hours as needed for wheezing or shortness of breath. 75 mL 12   atorvastatin (LIPITOR) 20 MG tablet Take 1 tablet (20 mg total) by mouth at bedtime.     Calcium Carbonate (CALCIUM 500 PO) Take 1 tablet by mouth daily.     Cholecalciferol 25 MCG (1000 UT) capsule Take 5,000 Units by mouth daily.     cloNIDine (CATAPRES) 0.1 MG tablet Take 2 tablets (0.2 mg total) by mouth 2 (two) times daily. 60 tablet 3   dicyclomine (BENTYL) 20 MG tablet Take one every 8 hours for abd cramps 20 tablet 0   memantine (  NAMENDA) 10 MG tablet Take 10 mg by mouth 2 (two) times daily.     metoCLOPramide (REGLAN) 5 MG tablet Take 1 tablet (5 mg total) by mouth 3 (three) times daily. 90 tablet 0   Multiple Vitamin (MULTIVITAMIN) tablet Take 1 tablet by mouth daily.     Nutritional Supplements (ENSURE ORIGINAL) LIQD Take by mouth 3 (three) times daily. One a day     ondansetron (ZOFRAN) 4 MG tablet Take 4 mg by mouth every 8 (eight) hours as needed for nausea or vomiting.     polyethylene glycol (MIRALAX / GLYCOLAX) 17 g packet Take 17 g by mouth daily. 14  each 0   promethazine (PHENERGAN) 25 MG/ML injection Inject into the muscle every 6 (six) hours as needed for nausea or vomiting.     sucralfate (CARAFATE) 1 g tablet Take 1 tablet (1 g total) by mouth 4 (four) times daily -  with meals and at bedtime. For 1 month then twice daily. 120 tablet 4   tamsulosin (FLOMAX) 0.4 MG CAPS capsule Take 0.4 mg by mouth daily.     traZODone (DESYREL) 50 MG tablet Take 50 mg by mouth at bedtime.     VASCEPA 1 g capsule Take 2 g by mouth 2 (two) times daily.     Venlafaxine HCl 75 MG TB24 Take 75 mg by mouth daily.     Vonoprazan Fumarate (VOQUEZNA) 10 MG TABS Take 10 mg by mouth daily. 90 tablet 3   QUEtiapine (SEROQUEL) 25 MG tablet Take 1 tablet (25 mg total) by mouth at bedtime for 14 days. 14 tablet 0   No current facility-administered medications for this visit.    Allergies as of 08/20/2023   (No Known Allergies)    Family History  Problem Relation Age of Onset   Hypertension Mother    Stroke Mother    Hypertension Father    Hypertension Sister     Social History   Socioeconomic History   Marital status: Divorced    Spouse name: Not on file   Number of children: Not on file   Years of education: Not on file   Highest education level: Not on file  Occupational History   Not on file  Tobacco Use   Smoking status: Former    Current packs/day: 0.00    Types: Cigarettes    Quit date: 2024    Years since quitting: 1.1   Smokeless tobacco: Not on file  Vaping Use   Vaping status: Never Used  Substance and Sexual Activity   Alcohol use: No   Drug use: No   Sexual activity: Not on file  Other Topics Concern   Not on file  Social History Narrative   Not on file   Social Drivers of Health   Financial Resource Strain: Not on file  Food Insecurity: No Food Insecurity (01/14/2023)   Hunger Vital Sign    Worried About Running Out of Food in the Last Year: Never true    Ran Out of Food in the Last Year: Never true  Transportation  Needs: No Transportation Needs (01/14/2023)   PRAPARE - Administrator, Civil Service (Medical): No    Lack of Transportation (Non-Medical): No  Physical Activity: Not on file  Stress: Not on file  Social Connections: Not on file    Review of systems General: negative for malaise, night sweats, fever, chills, weight loss Neck: Negative for lumps, goiter, pain and significant neck swelling Resp: Negative for cough,  wheezing, dyspnea at rest CV: Negative for chest pain, leg swelling, palpitations, orthopnea GI: denies melena, hematochezia, nausea, vomiting, diarrhea, constipation, dysphagia, odyonophagia, early satiety or unintentional weight loss.  The remainder of the review of systems is noncontributory.  Physical Exam: BP 122/70   Pulse 76   Temp (!) 97.4 F (36.3 C) (Oral)   Ht 5\' 11"  (1.803 m)   BMI 25.80 kg/m  General:   Alert, No distress noted. Pleasant and cooperative. In wheelchair  Head:  Normocephalic and atraumatic. Eyes:  Conjuctiva clear without scleral icterus. Mouth:  Oral mucosa pink and dry.  Heart: Normal rate and rhythm, s1 and s2 heart sounds present.  Lungs: Clear lung sounds in all lobes. Respirations equal and unlabored. Abdomen:  +BS, soft, non-tender and non-distended. No rebound or guarding. No HSM or masses noted. Neurologic:  Alert, disoriented due to hx of dementia  Psych:  Alert and cooperative. Normal mood and affect.  Invalid input(s): "6 MONTHS"   ASSESSMENT: KAIO KUHLMAN is a 63 y.o. male presenting today for ED follow up of Coffee ground emesis likely secondary to large hiatal hernia and reflux esophagitis   Patient presenting with ongoing, intermittent coffee-ground emesis for which she has undergone multiple endoscopic investigations that showed esophagitis and large hiatal hernia that has previously been refractory to different PPIs.  Patient was started on voquenza November and has been continued on Carafate 1 g twice daily  as prescribed by Dr. Claudie Revering after his most recent EUS.  He did present to the ED at the end of January with some coffee-ground emesis though blood counts at that time were stable, patient's sister does not think he has had any other episodes besides the one in January and reports there was a viral illness going around in his nursing facility which she thinks may have prompted his vomiting at that time.  There has been discussion of possible referral for evaluation of surgical repair of large hiatal hernia in the past though given patient's multi morbidities, would likely be beneficial to attempt medical management and avoid surgical intervention if at all possible.  Given patient seems to be doing better on Voqueza and blood counts are stable, will continue with current regimen.  I did ask his sister to make [place] aware if he has further episodes of coffee-ground emesis   PLAN:  -continue with voquezna 10mg  -continue with carafate 1g BID -pts family to make [place] aware of further episodes of CGE, at which time may consider referral to surgery for evaluation of [facility] repair   All questions were answered, patient verbalized understanding and is in agreement with plan as outlined above.    Follow Up: 4 months    Nusz L. Jeanmarie Hubert, MSN, APRN, AGNP-C Adult-Gerontology Nurse Practitioner [facility]  I have reviewed the note and agree with the APP's assessment as described in this progress note  Katrinka Blazing, MD Gastroenterology and Hepatology [facility] Gastroenterology

## 2023-08-20 NOTE — Patient Instructions (Signed)
As symptom appear to have improved and blood counts are stable on Voquezna and Carafate, will continue with pleasant 2 mg daily and Carafate 1 g twice daily Please let us know if patient has recurrent episodes of coffee-ground or dark-colored emesis.  Follow up 4 months

## 2023-08-29 ENCOUNTER — Encounter (HOSPITAL_COMMUNITY): Payer: Self-pay | Admitting: *Deleted

## 2023-08-29 ENCOUNTER — Emergency Department (HOSPITAL_COMMUNITY)
Admission: EM | Admit: 2023-08-29 | Discharge: 2023-08-29 | Disposition: A | Discharge status: Home / Self Care | Payer: Medicare Other | Attending: Emergency Medicine | Admitting: Emergency Medicine

## 2023-08-29 ENCOUNTER — Other Ambulatory Visit: Payer: Self-pay

## 2023-08-29 DIAGNOSIS — K920 Hematemesis: Secondary | ICD-10-CM | POA: Diagnosis not present

## 2023-08-29 DIAGNOSIS — R1110 Vomiting, unspecified: Secondary | ICD-10-CM | POA: Diagnosis present

## 2023-08-29 DIAGNOSIS — F0390 Unspecified dementia without behavioral disturbance: Secondary | ICD-10-CM | POA: Diagnosis not present

## 2023-08-29 LAB — CBC WITH DIFFERENTIAL/PLATELET
Abs Immature Granulocytes: 0.02 10*3/uL (ref 0.00–0.07)
Basophils Absolute: 0 10*3/uL (ref 0.0–0.1)
Basophils Relative: 0 %
Eosinophils Absolute: 0.1 10*3/uL (ref 0.0–0.5)
Eosinophils Relative: 1 %
HCT: 42.6 % (ref 39.0–52.0)
Hemoglobin: 14.2 g/dL (ref 13.0–17.0)
Immature Granulocytes: 0 %
Lymphocytes Relative: 10 %
Lymphs Abs: 0.8 10*3/uL (ref 0.7–4.0)
MCH: 29.5 pg (ref 26.0–34.0)
MCHC: 33.3 g/dL (ref 30.0–36.0)
MCV: 88.4 fL (ref 80.0–100.0)
Monocytes Absolute: 0.4 10*3/uL (ref 0.1–1.0)
Monocytes Relative: 5 %
Neutro Abs: 6.8 10*3/uL (ref 1.7–7.7)
Neutrophils Relative %: 84 %
RBC: 4.82 MIL/uL (ref 4.22–5.81)
RDW: 13 % (ref 11.5–15.5)
Smear Review: ADEQUATE
WBC: 8.2 10*3/uL (ref 4.0–10.5)
nRBC: 0 % (ref 0.0–0.2)

## 2023-08-29 LAB — COMPREHENSIVE METABOLIC PANEL
ALT: 26 U/L (ref 0–44)
AST: 29 U/L (ref 15–41)
Albumin: 3.4 g/dL — ABNORMAL LOW (ref 3.5–5.0)
Alkaline Phosphatase: 83 U/L (ref 38–126)
Anion gap: 17 — ABNORMAL HIGH (ref 5–15)
BUN: 19 mg/dL (ref 8–23)
CO2: 26 mmol/L (ref 22–32)
Calcium: 9.2 mg/dL (ref 8.9–10.3)
Chloride: 95 mmol/L — ABNORMAL LOW (ref 98–111)
Creatinine, Ser: 1.03 mg/dL (ref 0.61–1.24)
GFR, Estimated: >60 mL/min (ref >60)
Glucose, Bld: 134 mg/dL — ABNORMAL HIGH (ref 70–99)
Potassium: 4.7 mmol/L (ref 3.5–5.1)
Sodium: 138 mmol/L (ref 135–145)
Total Bilirubin: 0.9 mg/dL (ref 0.0–1.2)
Total Protein: 8 g/dL (ref 6.5–8.1)

## 2023-08-29 LAB — I-STAT CHEM 8, ED
BUN: 20 mg/dL (ref 8–23)
Calcium, Ion: 0.97 mmol/L — ABNORMAL LOW (ref 1.15–1.40)
Chloride: 99 mmol/L (ref 98–111)
Creatinine, Ser: 1 mg/dL (ref 0.61–1.24)
Glucose, Bld: 150 mg/dL — ABNORMAL HIGH (ref 70–99)
HCT: 44 % (ref 39.0–52.0)
Hemoglobin: 15 g/dL (ref 13.0–17.0)
Potassium: 4.6 mmol/L (ref 3.5–5.1)
Sodium: 136 mmol/L (ref 135–145)
TCO2: 27 mmol/L (ref 22–32)

## 2023-08-29 LAB — TYPE AND SCREEN
ABO/RH(D): O POS
Antibody Screen: NEGATIVE

## 2023-08-29 MED ORDER — ONDANSETRON 4 MG PO TBDP: ORAL_TABLET | ORAL | 0 refills | Status: AC

## 2023-08-29 MED ORDER — OMEPRAZOLE 40 MG PO CPDR: 40.0000 mg | DELAYED_RELEASE_CAPSULE | Freq: Every day | ORAL | 1 refills | Status: AC

## 2023-08-29 MED ORDER — PANTOPRAZOLE SODIUM 40 MG IV SOLR
40.0000 mg | Freq: Once | INTRAVENOUS | Status: AC
  Administered 2023-08-29: 40 mg via INTRAVENOUS
  Filled 2023-08-29: qty 10

## 2023-08-29 MED ORDER — SODIUM CHLORIDE 0.9 % IV BOLUS
1000.0000 mL | Freq: Once | INTRAVENOUS | Status: AC
  Administered 2023-08-29: 1000 mL via INTRAVENOUS

## 2023-08-29 NOTE — ED Notes (Signed)
 Attempted multiple times to give report to [facility]

## 2023-08-29 NOTE — ED Triage Notes (Signed)
 Pt BIB [facility] for emesis with coffee ground/blood. Reported pt with this for awhile.

## 2023-08-29 NOTE — ED Notes (Signed)
 Attempted to call report to [facility] again with no answer

## 2023-08-29 NOTE — Discharge Instructions (Signed)
 Follow-up with your gastroenterologist next week.  Return if any problems.

## 2023-08-29 NOTE — ED Provider Notes (Signed)
 [place] EMERGENCY DEPARTMENT AT [facility] Provider Note   CSN: [id number] Arrival date & time: 08/29/23  1547     History {Add pertinent medical, surgical, social history, OB history to HPI:1} Chief Complaint  Patient presents with   Emesis    Warren Sullivan is a 63 y.o. male.  Patient sent from nursing home for vomiting of blood.  Patient has dementia and states he did not throw up any blood and is not having any pain.   Emesis      Home Medications Prior to Admission medications   Medication Sig Start Date End Date Taking? Authorizing Provider  omeprazole (PRILOSEC) 40 MG capsule Take 1 capsule (40 mg total) by mouth daily. 08/29/23  Yes [name], MD  ondansetron (ZOFRAN-ODT) 4 MG disintegrating tablet 4mg  ODT q4 hours prn nausea/vomit 08/29/23  Yes [name], MD  acetaminophen (TYLENOL) 650 MG CR tablet Take 1,300 mg by mouth every 8 (eight) hours as needed (general discomfort).    [provider]  albuterol (PROVENTIL) (2.5 MG/3ML) 0.083% nebulizer solution Take 3 mLs (2.5 mg total) by nebulization every 4 (four) hours as needed for wheezing or shortness of breath. 01/30/22   Johnson, Clanford L, MD  atorvastatin (LIPITOR) 20 MG tablet Take 1 tablet (20 mg total) by mouth at bedtime. 06/11/22   Glade Lloyd, MD  Calcium Carbonate (CALCIUM 500 PO) Take 1 tablet by mouth daily.    [provider]  Cholecalciferol 25 MCG (1000 UT) capsule Take 5,000 Units by mouth daily.    [provider]  cloNIDine (CATAPRES) 0.1 MG tablet Take 2 tablets (0.2 mg total) by mouth 2 (two) times daily. 08/20/21   Shon Hale, MD  dicyclomine (BENTYL) 20 MG tablet Take one every 8 hours for abd cramps 01/04/23   [name], MD  memantine (NAMENDA) 10 MG tablet Take 10 mg by mouth 2 (two) times daily. 07/24/21   [provider]  metoCLOPramide (REGLAN) 5 MG tablet Take 1 tablet (5 mg total) by mouth 3 (three) times daily. 01/16/23    Azucena Fallen, MD  Multiple Vitamin (MULTIVITAMIN) tablet Take 1 tablet by mouth daily.    [provider]  Nutritional Supplements (ENSURE ORIGINAL) LIQD Take by mouth 3 (three) times daily. One a day    [provider]  ondansetron (ZOFRAN) 4 MG tablet Take 4 mg by mouth every 8 (eight) hours as needed for nausea or vomiting. 02/26/22   [provider]  polyethylene glycol (MIRALAX / GLYCOLAX) 17 g packet Take 17 g by mouth daily. 12/02/22   Vassie Loll, MD  promethazine (PHENERGAN) 25 MG/ML injection Inject into the muscle every 6 (six) hours as needed for nausea or vomiting.    [provider]  QUEtiapine (SEROQUEL) 25 MG tablet Take 1 tablet (25 mg total) by mouth at bedtime for 14 days. 04/27/23 05/11/23  Sloan Leiter, DO  sucralfate (CARAFATE) 1 g tablet Take 1 tablet (1 g total) by mouth 4 (four) times daily -  with meals and at bedtime. For 1 month then twice daily. 05/13/23 10/10/23  Mansouraty, Netty Starring., MD  tamsulosin (FLOMAX) 0.4 MG CAPS capsule Take 0.4 mg by mouth daily. 07/11/21   [provider]  traZODone (DESYREL) 50 MG tablet Take 50 mg by mouth at bedtime.    [provider]  VASCEPA 1 g capsule Take 2 g by mouth 2 (two) times daily. 08/08/21   [provider]  Venlafaxine HCl 75  MG TB24 Take 75 mg by mouth daily.    [provider]  Vonoprazan Fumarate (VOQUEZNA) 10 MG TABS Take 10 mg by mouth daily. 05/16/23   Dolores Frame, MD      Allergies    Patient has no known allergies.    Review of Systems   Review of Systems  Gastrointestinal:  Positive for vomiting.    Physical Exam Updated Vital Signs BP (!) 159/96   Pulse 91   Temp 98.4 F (36.9 C) (Oral)   Resp 20   Ht 5\' 11"  (1.803 m)   SpO2 93%   BMI 25.80 kg/m  Physical Exam  ED Results / Procedures / Treatments   Labs (all labs ordered are listed, but only abnormal results are displayed) Labs Reviewed  I-STAT CHEM  8, ED - Abnormal; Notable for the following components:      Result Value   Glucose, Bld 150 (*)    Calcium, Ion 0.97 (*)    All other components within normal limits  CBC WITH DIFFERENTIAL/PLATELET  COMPREHENSIVE METABOLIC PANEL  TYPE AND SCREEN    EKG None  Radiology No results found.  Procedures Procedures  {Document cardiac monitor, telemetry assessment procedure when appropriate:1}  Medications Ordered in ED Medications  sodium chloride 0.9 % bolus 1,000 mL (1,000 mLs Intravenous New Bag/Given 08/29/23 1724)  pantoprazole (PROTONIX) injection 40 mg (40 mg Intravenous Given 08/29/23 1725)    ED Course/ Medical Decision Making/ A&P  Patient with history of vomiting blood.  Labs show hemoglobin 15 and vitals are stable.  I spoke to GI and they recommended starting Prilosec and follow-up in the office {   Click here for ABCD2, HEART and other calculatorsREFRESH Note before signing :1}                              Medical Decision Making Amount and/or Complexity of Data Reviewed Labs: ordered.  Risk Prescription drug management.   Possible vomiting blood with stable hemoglobin.  Patient with a history of esophagitis  {Document critical care time when appropriate:1} {Document review of labs and clinical decision tools ie heart score, Chads2Vasc2 etc:1}  {Document your independent review of radiology images, and any outside records:1} {Document your discussion with family members, caretakers, and with consultants:1} {Document social determinants of health affecting pt's care:1} {Document your decision making why or why not admission, treatments were needed:1} Final Clinical Impression(s) / ED Diagnoses Final diagnoses:  Hematemesis without nausea    Rx / DC Orders ED Discharge Orders          Ordered    ondansetron (ZOFRAN-ODT) 4 MG disintegrating tablet        08/29/23 1820    omeprazole (PRILOSEC) 40 MG capsule  Daily        08/29/23 1820

## 2023-08-29 NOTE — ED Notes (Signed)
Attempted x 2 for IV access without success.  

## 2023-08-29 NOTE — Progress Notes (Signed)
 [facility] Liaison Note:  This patient is currently enrolled in [facility] outpatient-based palliative care.   Please call for any outpatient based palliative care related questions or concerns.  Thank you, Glenna Fellows, BSN, RN, OCN [facility] Liaison [phone]

## 2023-12-17 ENCOUNTER — Ambulatory Visit (INDEPENDENT_AMBULATORY_CARE_PROVIDER_SITE_OTHER): Payer: Medicare Other | Admitting: Gastroenterology

## 2024-01-20 ENCOUNTER — Encounter (INDEPENDENT_AMBULATORY_CARE_PROVIDER_SITE_OTHER): Payer: Self-pay | Admitting: Gastroenterology

## 2024-01-20 ENCOUNTER — Ambulatory Visit (INDEPENDENT_AMBULATORY_CARE_PROVIDER_SITE_OTHER): Admitting: Gastroenterology

## 2024-01-20 VITALS — BP 165/93 | HR 73 | Temp 97.1°F | Ht 71.0 in

## 2024-01-20 DIAGNOSIS — K2100 Gastro-esophageal reflux disease with esophagitis, without bleeding: Secondary | ICD-10-CM | POA: Diagnosis not present

## 2024-01-20 DIAGNOSIS — K449 Diaphragmatic hernia without obstruction or gangrene: Secondary | ICD-10-CM

## 2024-01-20 DIAGNOSIS — K920 Hematemesis: Secondary | ICD-10-CM | POA: Diagnosis not present

## 2024-01-20 DIAGNOSIS — K209 Esophagitis, unspecified without bleeding: Secondary | ICD-10-CM

## 2024-01-20 NOTE — Patient Instructions (Signed)
 Continue Voquezna  10 mg every day Decrease sucralfate  to 1 g twice a day

## 2024-01-20 NOTE — Progress Notes (Signed)
 Warren Sullivan, M.D. Gastroenterology & Hepatology [facility] [address] [place], [place] [address]  Primary Care Physician: Andi Jointer, DO [address] [place] [address]  I will communicate my assessment and recommendations to the referring MD via EMR.  Problems: Recurrent hematemesis and coffee-ground emesis due to esophagitis  History of Present Illness: Warren Sullivan is a 63 y.o. male with past medical history of COPD, CVA with left hemiparesis, dementia, hypertension, hyperlipidemia, BPH, GERD, who presents for follow up of hematemesis and coffee-ground emesis due to esophagitis.  The patient was last seen on 08/20/2023. At that time, the patient was continued on Voquezna  10 mg daily and Carafate  1 g twice daily.  Discussion was held regarding the possibility of surgical referral if symptoms recurred.  Patient had a new visit to the ED on 08/29/2023 due to possible coffee-ground emesis.  Hemoglobin at that time was 14.2.  He was given a prescription of omeprazole  40 mg daily by the ER.  Patient comes to the office with the staff of the nursing facility. She states they have not had any new symptoms since his last ER visit.  Patient reports feeling well. There have been  no reports of recent nausea, vomiting, fever, chills, hematochezia, melena, hematemesis, abdominal distention, abdominal pain, diarrhea, jaundice, pruritus or weight loss.  Upon review of the notes from nursing home, the patient is currently taking sucralfate  1 g every 6 hours and Voquezna  10 mg qday. He also has orders for PRN Reglan  and Phenergan .  Last EGD/EUS: 05/13/2023 - No gross lesions in the proximal esophagus and in the mid esophagus. - LA Grade C erosive esophagitis with no bleeding found distally. - Grossly tortuous esophagus distally. - Z- line irregular, 37 cm from the incisors. - 6 cm hiatal hernia. - Erythematous mucosa in the stomach. -  Duodenitis. Biopsied. - Normal minor papilla. - A single lesion endoscopically consistent with a pancreatic rest was found in between the minor and major papilla. - Normal major papilla.   EUS Impression: - An intramural ( subepithelial) lesion was found in the second portion of the duodenum. The lesion appeared to originate from within the deep mucosa ( Layer 2) . Tissue has not been obtained. However, the endosonographic appearance is consistent with pancreatic rest. - There was no sign of significant pathology in the pancreatic head and genu of the pancreas. - Multiple stones were visualized endosonographically in the gallbladder. - There was no sign of significant pathology in the common bile duct and in the common hepatic duct. - No malignant- appearing lymph nodes were visualized in the celiac region ( level 20) , perigastric region and porta hepatis region.  Past Medical History: Past Medical History:  Diagnosis Date   Acute respiratory failure with hypoxia (HCC)    AKI (acute kidney injury) (HCC) 08/18/2021   COPD (chronic obstructive pulmonary disease) (HCC)    CVA (cerebral vascular accident) (HCC)    Degenerative joint disease    Dehydration 08/18/2021   Dementia in other diseases classified elsewhere, severe, without behavioral disturbance, psychotic disturbance, mood disturbance, and anxiety (HCC)    Fall at home, initial encounter 08/29/2021   Gastrointestinal hemorrhage    GERD (gastroesophageal reflux disease)    Hemiplegia, unspecified affecting right dominant side (HCC)    Hyperlipidemia    Hypertension    MDD (major depressive disorder)    Nicotine  dependence    Pneumonia 08/28/2021   Small bowel obstruction (HCC) 08/17/2021   Stroke (  HCC)    Vitamin D  deficiency     Past Surgical History: Past Surgical History:  Procedure Laterality Date   BIOPSY  08/18/2021   Procedure: BIOPSY;  Surgeon: Eartha Angelia Sieving, MD;  Location: [facility];  Service:  Gastroenterology;;  guyann poncho and distal esophagus biopsies    BIOPSY  10/03/2021   Procedure: BIOPSY;  Surgeon: Eartha Angelia Sieving, MD;  Location: [facility];  Service: Gastroenterology;;   BIOPSY  01/30/2022   Procedure: BIOPSY;  Surgeon: Eartha Angelia Sieving, MD;  Location: [facility];  Service: Gastroenterology;;   BIOPSY  01/15/2023   Procedure: BIOPSY;  Surgeon: Eartha Angelia Sieving, MD;  Location: [facility];  Service: Gastroenterology;;   BIOPSY  05/13/2023   Procedure: BIOPSY;  Surgeon: Wilhelmenia Aloha Raddle., MD;  Location: [facility];  Service: Gastroenterology;;   ESOPHAGOGASTRODUODENOSCOPY (EGD) WITH PROPOFOL  N/A 08/18/2021   Surgeon: Eartha Angelia Sieving, MD; severe acute nonspecific esophagitis with ulceration and reactive hyperplasia, peptic duodenitis, acute chronic gastritis with H. pylori, 3 cm hiatal hernia.   ESOPHAGOGASTRODUODENOSCOPY (EGD) WITH PROPOFOL  N/A 10/03/2021   Surgeon: Eartha Angelia, Sieving, MD; myocarditis, chronic gastritis with H. pylori, 2 cm hiatal hernia, erythematous duodenopathy.   ESOPHAGOGASTRODUODENOSCOPY (EGD) WITH PROPOFOL  N/A 01/30/2022   Procedure: ESOPHAGOGASTRODUODENOSCOPY (EGD) WITH PROPOFOL ;  Surgeon: Eartha Angelia Sieving, MD;  Location: [facility];  Service: Gastroenterology;  Laterality: N/A;   ESOPHAGOGASTRODUODENOSCOPY (EGD) WITH PROPOFOL  N/A 01/15/2023   Procedure: ESOPHAGOGASTRODUODENOSCOPY (EGD) WITH PROPOFOL ;  Surgeon: Eartha Angelia Sieving, MD;  Location: [facility];  Service: Gastroenterology;  Laterality: N/A;   ESOPHAGOGASTRODUODENOSCOPY (EGD) WITH PROPOFOL  N/A 05/13/2023   Procedure: ESOPHAGOGASTRODUODENOSCOPY (EGD) WITH PROPOFOL ;  Surgeon: Wilhelmenia Aloha Raddle., MD;  Location: [facility];  Service: Gastroenterology;  Laterality: N/A;   EUS N/A 05/13/2023   Procedure: UPPER ENDOSCOPIC ULTRASOUND (EUS) RADIAL;  Surgeon: Wilhelmenia Aloha Raddle., MD;  Location: [facility];   Service: Gastroenterology;  Laterality: N/A;   HERNIA REPAIR     HOT HEMOSTASIS  01/15/2023   Procedure: HOT HEMOSTASIS (ARGON PLASMA COAGULATION/BICAP);  Surgeon: Eartha Angelia, Sieving, MD;  Location: [facility];  Service: Gastroenterology;;    Family History: Family History  Problem Relation Age of Onset   Hypertension Mother    Stroke Mother    Hypertension Father    Hypertension Sister     Social History: Social History   Tobacco Use  Smoking Status Former   Current packs/day: 0.00   Types: Cigarettes   Quit date: 2024   Years since quitting: 1.5  Smokeless Tobacco Not on file   Social History   Substance and Sexual Activity  Alcohol Use No   Social History   Substance and Sexual Activity  Drug Use No    Allergies: No Known Allergies  Medications: Current Outpatient Medications  Medication Sig Dispense Refill   acetaminophen  (TYLENOL ) 650 MG CR tablet Take 1,300 mg by mouth every 8 (eight) hours as needed (general discomfort).     Amino Acids-Protein Hydrolys (PRO-STAT PO) Take by mouth 3 (three) times daily.     atorvastatin  (LIPITOR) 20 MG tablet Take 1 tablet (20 mg total) by mouth at bedtime.     Calcium  Carbonate (CALCIUM  500 PO) Take 1 tablet by mouth daily.     Cholecalciferol  25 MCG (1000 UT) capsule Take 5,000 Units by mouth daily.     cloNIDine  (CATAPRES ) 0.1 MG tablet Take 2 tablets (0.2 mg total) by mouth 2 (two) times daily. 60 tablet 3   dicyclomine  (BENTYL ) 20  MG tablet Take one every 8 hours for abd cramps 20 tablet 0   memantine  (NAMENDA ) 10 MG tablet Take 10 mg by mouth 2 (two) times daily.     metoCLOPramide  (REGLAN ) 5 MG tablet Take 1 tablet (5 mg total) by mouth 3 (three) times daily. 90 tablet 0   Multiple Vitamin (MULTIVITAMIN) tablet Take 1 tablet by mouth daily.     Nutritional Supplements (ENSURE ORIGINAL) LIQD Take by mouth 3 (three) times daily. One a day     ondansetron  (ZOFRAN ) 4 MG tablet Take 4 mg by mouth every 8 (eight)  hours as needed for nausea or vomiting. (Patient taking differently: Take 4 mg by mouth every 6 (six) hours as needed for nausea or vomiting.)     ondansetron  (ZOFRAN -ODT) 4 MG disintegrating tablet 4mg  ODT q4 hours prn nausea/vomit (Patient taking differently: Take 4 mg by mouth. 4mg  ODT q 6 hours prn nausea/vomit) 10 tablet 0   polyethylene glycol (MIRALAX  / GLYCOLAX ) 17 g packet Take 17 g by mouth daily. 14 each 0   promethazine  (PHENERGAN ) 25 MG/ML injection Inject into the muscle every 6 (six) hours as needed for nausea or vomiting.     sucralfate  (CARAFATE ) 1 g tablet Take 1 tablet (1 g total) by mouth 4 (four) times daily -  with meals and at bedtime. For 1 month then twice daily. 120 tablet 4   tamsulosin  (FLOMAX ) 0.4 MG CAPS capsule Take 0.4 mg by mouth daily.     traZODone (DESYREL) 50 MG tablet Take 50 mg by mouth at bedtime.     VASCEPA  1 g capsule Take 2 g by mouth 2 (two) times daily.     Venlafaxine  HCl 75 MG TB24 Take 75 mg by mouth daily.     Vonoprazan Fumarate  (VOQUEZNA ) 10 MG TABS Take 10 mg by mouth daily. 90 tablet 3   albuterol  (PROVENTIL ) (2.5 MG/3ML) 0.083% nebulizer solution Take 3 mLs (2.5 mg total) by nebulization every 4 (four) hours as needed for wheezing or shortness of breath. 75 mL 12   omeprazole  (PRILOSEC) 40 MG capsule Take 1 capsule (40 mg total) by mouth daily. 30 capsule 1   QUEtiapine  (SEROQUEL ) 25 MG tablet Take 1 tablet (25 mg total) by mouth at bedtime for 14 days. 14 tablet 0   No current facility-administered medications for this visit.    Review of Systems: GENERAL: negative for malaise, night sweats HEENT: No changes in hearing or vision, no nose bleeds or other nasal problems. NECK: Negative for lumps, goiter, pain and significant neck swelling RESPIRATORY: Negative for cough, wheezing CARDIOVASCULAR: Negative for chest pain, leg swelling, palpitations, orthopnea GI: SEE HPI MUSCULOSKELETAL: Negative for joint pain or swelling, back pain, and  muscle pain. SKIN: Negative for lesions, rash PSYCH: Negative for sleep disturbance, mood disorder and recent psychosocial stressors. HEMATOLOGY Negative for prolonged bleeding, bruising easily, and swollen nodes. ENDOCRINE: Negative for cold or heat intolerance, polyuria, polydipsia and goiter. NEURO: negative for tremor, gait imbalance, syncope and seizures. The remainder of the review of systems is noncontributory.   Physical Exam: BP (!) 165/93 (BP Location: Right Arm, Patient Position: Sitting, Cuff Size: Normal)   Pulse 73   Temp (!) 97.1 F (36.2 C) (Temporal)   Ht 5' 11 (1.803 m)   BMI 25.80 kg/m  GENERAL: The patient is AO x1, in no acute distress.  Sitting in wheelchair. HEENT: Head is normocephalic and atraumatic. EOMI are intact. Mouth is well hydrated and without lesions. NECK: Supple. No masses  LUNGS: Clear to auscultation. No presence of rhonchi/wheezing/rales. Adequate chest expansion HEART: RRR, normal s1 and s2. ABDOMEN: Soft, nontender, no guarding, no peritoneal signs, and nondistended. BS +. No masses. EXTREMITIES: Without any cyanosis, clubbing, rash, lesions or edema. NEUROLOGIC: A alert and awake, AOx1 no focal motor deficit. SKIN: no jaundice, no rashes  Imaging/Labs: as above  I personally reviewed and interpreted the available labs, imaging and endoscopic files.  Impression and Plan: WILLEY DUE is a 63 y.o. male with past medical history of COPD, CVA with left hemiparesis, dementia, hypertension, hyperlipidemia, BPH, GERD, who presents for follow up of hematemesis and coffee-ground emesis due to esophagitis.  Patient has presented recurrent episodes of reflux esophagitis that have required multiple ER evaluations in the past.  It is likely this has been exacerbated by his hiatal hernia.  Unfortunately, given his dementia he is not the best surgical candidate and multiple PPIs have been tried in the past.  Fortunately, use of  Voquezna  has control  his disease most recently as he has not been seen in the ER for the last 5 months.  Will continue with the same medication and attempt to wean off his Carafate  for now.  -Continue Voquezna  10 mg every day - Decrease sucralfate  to 1 g twice a day  All questions were answered.      Warren Fortune, MD Gastroenterology and Hepatology [facility] Gastroenterology

## 2024-01-23 ENCOUNTER — Telehealth (INDEPENDENT_AMBULATORY_CARE_PROVIDER_SITE_OTHER): Payer: Self-pay | Admitting: Gastroenterology

## 2024-01-23 NOTE — Telephone Encounter (Signed)
 Fax received from [facility] approving Voquezna  10 mg tablets. Approval valid through 07/08/24. Auth scanned under media

## 2024-01-23 NOTE — Telephone Encounter (Signed)
 PA completed for Voquezna  10 gm tablets via Cover My Meds.  When submitting, it did say that PA had already been sent to insurance but I did not see any note. Await determination.

## 2024-05-13 ENCOUNTER — Encounter (INDEPENDENT_AMBULATORY_CARE_PROVIDER_SITE_OTHER): Payer: Self-pay | Admitting: Gastroenterology
